# Patient Record
Sex: Female | Born: 1955 | Race: White | Hispanic: No | Marital: Married | Smoking: Never smoker
Health system: Southern US, Community
[De-identification: ages and names within clinical notes are randomized; demographics above are authoritative.]

## PROBLEM LIST (undated history)

## (undated) DIAGNOSIS — K219 Gastro-esophageal reflux disease without esophagitis: Secondary | ICD-10-CM

## (undated) DIAGNOSIS — F32A Depression, unspecified: Secondary | ICD-10-CM

## (undated) DIAGNOSIS — Z1509 Genetic susceptibility to other malignant neoplasm: Secondary | ICD-10-CM

## (undated) DIAGNOSIS — F419 Anxiety disorder, unspecified: Secondary | ICD-10-CM

## (undated) DIAGNOSIS — J45909 Unspecified asthma, uncomplicated: Secondary | ICD-10-CM

## (undated) DIAGNOSIS — F988 Other specified behavioral and emotional disorders with onset usually occurring in childhood and adolescence: Secondary | ICD-10-CM

## (undated) DIAGNOSIS — K589 Irritable bowel syndrome without diarrhea: Secondary | ICD-10-CM

## (undated) DIAGNOSIS — Z8719 Personal history of other diseases of the digestive system: Secondary | ICD-10-CM

## (undated) DIAGNOSIS — D126 Benign neoplasm of colon, unspecified: Secondary | ICD-10-CM

## (undated) DIAGNOSIS — T7840XA Allergy, unspecified, initial encounter: Secondary | ICD-10-CM

## (undated) DIAGNOSIS — I10 Essential (primary) hypertension: Secondary | ICD-10-CM

## (undated) DIAGNOSIS — G709 Myoneural disorder, unspecified: Secondary | ICD-10-CM

## (undated) DIAGNOSIS — E119 Type 2 diabetes mellitus without complications: Secondary | ICD-10-CM

## (undated) DIAGNOSIS — E785 Hyperlipidemia, unspecified: Secondary | ICD-10-CM

## (undated) DIAGNOSIS — M1990 Unspecified osteoarthritis, unspecified site: Secondary | ICD-10-CM

## (undated) DIAGNOSIS — F329 Major depressive disorder, single episode, unspecified: Secondary | ICD-10-CM

## (undated) DIAGNOSIS — H269 Unspecified cataract: Secondary | ICD-10-CM

## (undated) HISTORY — DX: Benign neoplasm of colon, unspecified: D12.6

## (undated) HISTORY — DX: Unspecified cataract: H26.9

## (undated) HISTORY — DX: Myoneural disorder, unspecified: G70.9

## (undated) HISTORY — PX: CHOLECYSTECTOMY: SHX55

## (undated) HISTORY — PX: UPPER GI ENDOSCOPY: SHX6162

## (undated) HISTORY — DX: Allergy, unspecified, initial encounter: T78.40XA

## (undated) HISTORY — DX: Genetic susceptibility to other malignant neoplasm: Z15.09

## (undated) HISTORY — PX: NASAL SINUS SURGERY: SHX719

## (undated) HISTORY — DX: Unspecified osteoarthritis, unspecified site: M19.90

## (undated) HISTORY — PX: HERNIA REPAIR: SHX51

## (undated) HISTORY — PX: COSMETIC SURGERY: SHX468

## (undated) HISTORY — DX: Other specified behavioral and emotional disorders with onset usually occurring in childhood and adolescence: F98.8

## (undated) HISTORY — PX: OTHER SURGICAL HISTORY: SHX169

## (undated) HISTORY — PX: CARPAL TUNNEL RELEASE: SHX101

## (undated) HISTORY — DX: Hyperlipidemia, unspecified: E78.5

## (undated) HISTORY — DX: Irritable bowel syndrome, unspecified: K58.9

## (undated) HISTORY — PX: POLYPECTOMY: SHX149

## (undated) HISTORY — DX: Depression, unspecified: F32.A

## (undated) HISTORY — PX: TUBAL LIGATION: SHX77

## (undated) HISTORY — DX: Essential (primary) hypertension: I10

## (undated) HISTORY — DX: Type 2 diabetes mellitus without complications: E11.9

## (undated) HISTORY — DX: Major depressive disorder, single episode, unspecified: F32.9

---

## 1997-06-28 ENCOUNTER — Ambulatory Visit (HOSPITAL_COMMUNITY): Admission: RE | Admit: 1997-06-28 | Discharge: 1997-06-28 | Payer: Self-pay | Admitting: *Deleted

## 1998-02-21 ENCOUNTER — Ambulatory Visit (HOSPITAL_COMMUNITY): Admission: RE | Admit: 1998-02-21 | Discharge: 1998-02-21 | Payer: Self-pay | Admitting: Orthopedic Surgery

## 1998-12-24 ENCOUNTER — Other Ambulatory Visit: Admission: RE | Admit: 1998-12-24 | Discharge: 1998-12-24 | Payer: Self-pay | Admitting: Gastroenterology

## 1999-04-07 HISTORY — PX: NISSEN FUNDOPLICATION: SHX2091

## 1999-05-19 ENCOUNTER — Ambulatory Visit: Admission: RE | Admit: 1999-05-19 | Discharge: 1999-05-19 | Payer: Self-pay | Admitting: Internal Medicine

## 1999-07-07 ENCOUNTER — Ambulatory Visit: Admission: RE | Admit: 1999-07-07 | Discharge: 1999-07-07 | Payer: Self-pay | Admitting: Pulmonary Disease

## 2000-01-12 ENCOUNTER — Ambulatory Visit (HOSPITAL_COMMUNITY): Admission: RE | Admit: 2000-01-12 | Discharge: 2000-01-12 | Payer: Self-pay | Admitting: Gastroenterology

## 2000-03-12 ENCOUNTER — Ambulatory Visit (HOSPITAL_COMMUNITY): Admission: RE | Admit: 2000-03-12 | Discharge: 2000-03-12 | Payer: Self-pay | Admitting: Surgery

## 2000-03-12 ENCOUNTER — Encounter: Payer: Self-pay | Admitting: Surgery

## 2000-03-31 ENCOUNTER — Encounter: Payer: Self-pay | Admitting: Surgery

## 2000-04-06 ENCOUNTER — Inpatient Hospital Stay (HOSPITAL_COMMUNITY): Admission: RE | Admit: 2000-04-06 | Discharge: 2000-04-07 | Payer: Self-pay | Admitting: Surgery

## 2002-04-06 DIAGNOSIS — E785 Hyperlipidemia, unspecified: Secondary | ICD-10-CM

## 2002-04-06 HISTORY — DX: Hyperlipidemia, unspecified: E78.5

## 2002-12-01 ENCOUNTER — Encounter: Payer: Self-pay | Admitting: Orthopedic Surgery

## 2002-12-01 ENCOUNTER — Encounter: Admission: RE | Admit: 2002-12-01 | Discharge: 2002-12-01 | Payer: Self-pay | Admitting: Orthopedic Surgery

## 2004-04-06 HISTORY — PX: EYE SURGERY: SHX253

## 2005-05-04 ENCOUNTER — Ambulatory Visit: Payer: Self-pay

## 2005-06-04 ENCOUNTER — Ambulatory Visit: Payer: Self-pay

## 2006-06-18 ENCOUNTER — Encounter: Admission: RE | Admit: 2006-06-18 | Discharge: 2006-09-16 | Payer: Self-pay | Admitting: Specialist

## 2007-06-24 ENCOUNTER — Ambulatory Visit (HOSPITAL_COMMUNITY): Admission: RE | Admit: 2007-06-24 | Discharge: 2007-06-24 | Payer: Self-pay | Admitting: Internal Medicine

## 2007-06-24 ENCOUNTER — Encounter: Admission: RE | Admit: 2007-06-24 | Discharge: 2007-06-24 | Payer: Self-pay | Admitting: Orthopedic Surgery

## 2007-11-18 ENCOUNTER — Other Ambulatory Visit: Admission: RE | Admit: 2007-11-18 | Discharge: 2007-11-18 | Payer: Self-pay | Admitting: Internal Medicine

## 2008-04-06 DIAGNOSIS — E119 Type 2 diabetes mellitus without complications: Secondary | ICD-10-CM

## 2008-04-06 HISTORY — DX: Type 2 diabetes mellitus without complications: E11.9

## 2009-02-08 ENCOUNTER — Ambulatory Visit: Payer: Self-pay | Admitting: Genetic Counselor

## 2009-04-14 ENCOUNTER — Encounter: Admission: RE | Admit: 2009-04-14 | Discharge: 2009-04-14 | Payer: Self-pay | Admitting: Orthopaedic Surgery

## 2009-10-04 ENCOUNTER — Ambulatory Visit: Payer: Self-pay | Admitting: Genetic Counselor

## 2010-04-06 HISTORY — PX: ROTATOR CUFF REPAIR: SHX139

## 2010-12-09 ENCOUNTER — Other Ambulatory Visit: Payer: Self-pay | Admitting: Internal Medicine

## 2010-12-09 ENCOUNTER — Other Ambulatory Visit (HOSPITAL_COMMUNITY)
Admission: RE | Admit: 2010-12-09 | Discharge: 2010-12-09 | Disposition: A | Discharge status: Home / Self Care | Payer: PRIVATE HEALTH INSURANCE | Source: Ambulatory Visit | Attending: Internal Medicine | Admitting: Internal Medicine

## 2010-12-09 DIAGNOSIS — Z01419 Encounter for gynecological examination (general) (routine) without abnormal findings: Secondary | ICD-10-CM | POA: Insufficient documentation

## 2011-12-14 ENCOUNTER — Other Ambulatory Visit (HOSPITAL_COMMUNITY)
Admission: RE | Admit: 2011-12-14 | Discharge: 2011-12-14 | Disposition: A | Discharge status: Home / Self Care | Payer: PRIVATE HEALTH INSURANCE | Source: Ambulatory Visit | Attending: Internal Medicine | Admitting: Internal Medicine

## 2011-12-14 ENCOUNTER — Other Ambulatory Visit: Payer: Self-pay | Admitting: Emergency Medicine

## 2011-12-14 DIAGNOSIS — Z01419 Encounter for gynecological examination (general) (routine) without abnormal findings: Secondary | ICD-10-CM | POA: Insufficient documentation

## 2012-07-18 ENCOUNTER — Other Ambulatory Visit: Payer: Self-pay | Admitting: Emergency Medicine

## 2012-12-26 ENCOUNTER — Other Ambulatory Visit: Payer: Self-pay | Admitting: *Deleted

## 2012-12-26 DIAGNOSIS — I83893 Varicose veins of bilateral lower extremities with other complications: Secondary | ICD-10-CM

## 2013-01-23 ENCOUNTER — Encounter: Payer: Self-pay | Admitting: Vascular Surgery

## 2013-01-24 ENCOUNTER — Encounter (INDEPENDENT_AMBULATORY_CARE_PROVIDER_SITE_OTHER): Payer: Self-pay

## 2013-01-24 ENCOUNTER — Ambulatory Visit (HOSPITAL_COMMUNITY)
Admission: RE | Admit: 2013-01-24 | Discharge: 2013-01-24 | Disposition: A | Discharge status: Home / Self Care | Payer: Managed Care, Other (non HMO) | Source: Ambulatory Visit | Attending: Vascular Surgery | Admitting: Vascular Surgery

## 2013-01-24 ENCOUNTER — Ambulatory Visit (INDEPENDENT_AMBULATORY_CARE_PROVIDER_SITE_OTHER): Payer: Managed Care, Other (non HMO) | Admitting: Vascular Surgery

## 2013-01-24 ENCOUNTER — Encounter: Payer: Self-pay | Admitting: Vascular Surgery

## 2013-01-24 VITALS — BP 121/72 | HR 83 | Ht 64.0 in | Wt 179.2 lb

## 2013-01-24 DIAGNOSIS — I83893 Varicose veins of bilateral lower extremities with other complications: Secondary | ICD-10-CM

## 2013-01-24 DIAGNOSIS — I8390 Asymptomatic varicose veins of unspecified lower extremity: Secondary | ICD-10-CM | POA: Insufficient documentation

## 2013-01-24 DIAGNOSIS — M79609 Pain in unspecified limb: Secondary | ICD-10-CM | POA: Insufficient documentation

## 2013-01-24 DIAGNOSIS — R609 Edema, unspecified: Secondary | ICD-10-CM | POA: Insufficient documentation

## 2013-01-24 NOTE — Progress Notes (Signed)
[facility] [place]   Patient name: Sydney Dickson MRN: [id number] DOB: 1956/01/25 Sex: female   Referred by: Katrinka Blazing  Reason for referral:  Chief Complaint  Patient presents with  . New Evaluation    bil leg edema "feels like dead weights" - Loree Fee, PA    HISTORY OF PRESENT ILLNESS: The patient presents today for evaluation of lower surety discomfort. She is an active healthy 57 year old female who reports a sensation of heavy  Aching legs mostly from her knees distally. This is somewhat worse on her left leg her right leg. She reports this is a progressive throughout the day. She does not have the sensation in the morning when she first arises. She notes minimal swelling at the end of the day. She has no history of DVT. She does have a history of venous disease in mother and sister.  Past Medical History  Diagnosis Date  . COPD (chronic obstructive pulmonary disease)     Past Surgical History  Procedure Laterality Date  . Hernia repair      History   Social History  . Marital Status: Married    Spouse Name: N/A    Number of Children: N/A  . Years of Education: N/A   Occupational History  . Not on file.   Social History Main Topics  . Smoking status: Never Smoker   . Smokeless tobacco: Not on file  . Alcohol Use: No  . Drug Use: No  . Sexual Activity: Not on file   Other Topics Concern  . Not on file   Social History Narrative  . No narrative on file    Family History  Problem Relation Age of Onset  . Cancer Mother   . Hyperlipidemia Mother   . Hypertension Mother   . Cancer Father   . Hyperlipidemia Father   . Hypertension Father   . Cancer Sister   . Cancer Brother     Allergies as of 01/24/2013 - Review Complete 01/24/2013  Allergen Reaction Noted  . Penicillins  01/24/2013    No current outpatient prescriptions on file prior to visit.   No current facility-administered medications on file prior to visit.      REVIEW OF SYSTEMS:  Positives indicated with an "X"  CARDIOVASCULAR:  [ ]  chest pain   [ ]  chest pressure   [ ]  palpitations   [ ]  orthopnea   [x ] dyspnea on exertion   [ ]  claudication   [ ]  rest pain   [ ]  DVT   [ ]  phlebitis PULMONARY:   [ ]  productive cough   [ ]  asthma   [ ]  wheezing NEUROLOGIC:   [x ] weakness  [ ]  paresthesias  [ ]  aphasia  [ ]  amaurosis  [ ]  dizziness HEMATOLOGIC:   [ ]  bleeding problems   [ ]  clotting disorders MUSCULOSKELETAL:  [ ]  joint pain   [ ]  joint swelling GASTROINTESTINAL: [ ]   blood in stool  [ ]   hematemesis GENITOURINARY:  [ ]   dysuria  [ ]   hematuria PSYCHIATRIC:  [ ]  history of major depression INTEGUMENTARY:  [ ]  rashes  [ ]  ulcers CONSTITUTIONAL:  [ ]  fever   [ ]  chills  PHYSICAL EXAMINATION:  General: The patient is a well-nourished female, in no acute distress. Vital signs are BP 121/72  Pulse 83  Ht 5\' 4"  (1.626 m)  Wt 179 lb 3.2 oz (81.285 kg)  BMI 30.74 kg/m2  SpO2 99% Pulmonary:  There is a good air exchange bilaterally without wheezing or rales. Abdomen: Soft and non-tender with normal pitch bowel sounds. Musculoskeletal: There are no major deformities.  There is no significant extremity pain. Neurologic: No focal weakness or paresthesias are detected, Skin: There are no ulcer or rashes noted. Psychiatric: The patient has normal affect. Cardiovascular: There is a regular rate and rhythm without significant murmur appreciated. Pulse status 2+ radial and 2+ dorsalis pedis pulses bilaterally No venous varicosities noted. Mild left ankle swelling.  [facility] Vascular Lab Studies:  Ordered and Independently Reviewed no evidence of deep venous thrombosis and no evidence of deep venous valvular incompetence. She does have reflux in her great saphenous vein bilaterally  Impression and Plan:  Lower extremity aching and tired and heavy sensation with prolonged standing or sitting. She also has documented venous hypertension related  superficial reflux bilaterally. I explained the significance of this the patient. I explained that her symptoms are well may be related to venous hypertension. We fitted her today with graduated compression stockings 20-30 mm mercury thigh-high instructed on the use of these. Also explained the importance of elevation and ibuprofen for treatment of her discomfort. We will see her again in 3 months for continued discussion. She would be a candidate for laser ablation of her great saphenous vein should she fail conservative treatment.    Kennen Stammer [facility] [place] Office: [phone]

## 2013-03-06 ENCOUNTER — Telehealth: Payer: Self-pay | Admitting: *Deleted

## 2013-03-06 NOTE — Telephone Encounter (Signed)
Rx= written  Call pt when ready   Refill = Vyanse

## 2013-03-09 ENCOUNTER — Telehealth: Payer: Self-pay | Admitting: Family Medicine

## 2013-03-09 NOTE — Telephone Encounter (Signed)
PT CALLED TO SAY THAT SHE WAS RETURNING YOUR CALL PROBABLY ABOUT THE VYVANSE NEW LOWER DOSE, 30MG  1 QD.  PLEASE CALL AND ADVISE [phone] OK TO LEAVE MESSAGE  NO FUTURE APPTS

## 2013-03-09 NOTE — Telephone Encounter (Signed)
Ok I can do new dose can you double check with her to see if we are just doing 30 day at local pharmacy that [place] entered or the 90 day mail order? Please and thanks.

## 2013-03-13 ENCOUNTER — Other Ambulatory Visit: Payer: Self-pay | Admitting: Emergency Medicine

## 2013-03-13 MED ORDER — METFORMIN HCL 500 MG PO TABS: 500.0000 mg | ORAL_TABLET | Freq: Three times a day (TID) | ORAL | Status: DC

## 2013-03-14 ENCOUNTER — Other Ambulatory Visit: Payer: Self-pay | Admitting: Emergency Medicine

## 2013-03-14 MED ORDER — METFORMIN HCL 500 MG PO TABS: 500.0000 mg | ORAL_TABLET | Freq: Three times a day (TID) | ORAL | Status: DC

## 2013-03-23 ENCOUNTER — Other Ambulatory Visit: Payer: Self-pay | Admitting: Emergency Medicine

## 2013-03-23 MED ORDER — ESCITALOPRAM OXALATE 20 MG PO TABS: 20.0000 mg | ORAL_TABLET | Freq: Every day | ORAL | Status: DC

## 2013-03-27 ENCOUNTER — Other Ambulatory Visit: Payer: Self-pay | Admitting: Emergency Medicine

## 2013-03-27 MED ORDER — LISDEXAMFETAMINE DIMESYLATE 40 MG PO CAPS: 40.0000 mg | ORAL_CAPSULE | Freq: Every day | ORAL | Status: DC

## 2013-04-23 ENCOUNTER — Other Ambulatory Visit: Payer: Self-pay | Admitting: Internal Medicine

## 2013-04-27 ENCOUNTER — Encounter: Payer: Self-pay | Admitting: Emergency Medicine

## 2013-04-27 ENCOUNTER — Ambulatory Visit (INDEPENDENT_AMBULATORY_CARE_PROVIDER_SITE_OTHER): Payer: Managed Care, Other (non HMO) | Admitting: Emergency Medicine

## 2013-04-27 VITALS — BP 130/64 | HR 62 | Temp 98.0°F | Resp 18 | Ht 64.5 in | Wt 181.0 lb

## 2013-04-27 DIAGNOSIS — I10 Essential (primary) hypertension: Secondary | ICD-10-CM

## 2013-04-27 DIAGNOSIS — E785 Hyperlipidemia, unspecified: Secondary | ICD-10-CM

## 2013-04-27 DIAGNOSIS — S51809A Unspecified open wound of unspecified forearm, initial encounter: Secondary | ICD-10-CM

## 2013-04-27 DIAGNOSIS — E119 Type 2 diabetes mellitus without complications: Secondary | ICD-10-CM

## 2013-04-27 DIAGNOSIS — S61451A Open bite of right hand, initial encounter: Secondary | ICD-10-CM

## 2013-04-27 DIAGNOSIS — E1165 Type 2 diabetes mellitus with hyperglycemia: Secondary | ICD-10-CM | POA: Insufficient documentation

## 2013-04-27 DIAGNOSIS — E1169 Type 2 diabetes mellitus with other specified complication: Secondary | ICD-10-CM | POA: Insufficient documentation

## 2013-04-27 DIAGNOSIS — F988 Other specified behavioral and emotional disorders with onset usually occurring in childhood and adolescence: Secondary | ICD-10-CM | POA: Insufficient documentation

## 2013-04-27 DIAGNOSIS — L089 Local infection of the skin and subcutaneous tissue, unspecified: Secondary | ICD-10-CM

## 2013-04-27 DIAGNOSIS — S51851A Open bite of right forearm, initial encounter: Secondary | ICD-10-CM

## 2013-04-27 DIAGNOSIS — W5501XA Bitten by cat, initial encounter: Principal | ICD-10-CM

## 2013-04-27 DIAGNOSIS — S61409A Unspecified open wound of unspecified hand, initial encounter: Secondary | ICD-10-CM

## 2013-04-27 LAB — HEMOGLOBIN A1C
Hgb A1c MFr Bld: 6.7 % — ABNORMAL HIGH (ref <5.7)
MEAN PLASMA GLUCOSE: 146 mg/dL — AB (ref <117)

## 2013-04-27 LAB — CBC WITH DIFFERENTIAL/PLATELET
BASOS ABS: 0.1 10*3/uL (ref 0.0–0.1)
BASOS PCT: 1 % (ref 0–1)
EOS PCT: 3 % (ref 0–5)
Eosinophils Absolute: 0.3 10*3/uL (ref 0.0–0.7)
HCT: 40.7 % (ref 36.0–46.0)
Hemoglobin: 13.9 g/dL (ref 12.0–15.0)
Lymphocytes Relative: 39 % (ref 12–46)
Lymphs Abs: 3.5 10*3/uL (ref 0.7–4.0)
MCH: 27.9 pg (ref 26.0–34.0)
MCHC: 34.2 g/dL (ref 30.0–36.0)
MCV: 81.7 fL (ref 78.0–100.0)
MONO ABS: 0.6 10*3/uL (ref 0.1–1.0)
Monocytes Relative: 6 % (ref 3–12)
Neutro Abs: 4.6 10*3/uL (ref 1.7–7.7)
Neutrophils Relative %: 51 % (ref 43–77)
Platelets: 339 10*3/uL (ref 150–400)
RBC: 4.98 MIL/uL (ref 3.87–5.11)
RDW: 14.2 % (ref 11.5–15.5)
WBC: 9 10*3/uL (ref 4.0–10.5)

## 2013-04-27 MED ORDER — DOXYCYCLINE HYCLATE 100 MG PO TABS: 100.0000 mg | ORAL_TABLET | Freq: Two times a day (BID) | ORAL | Status: DC

## 2013-04-27 MED ORDER — CEFTRIAXONE SODIUM 1 G IJ SOLR
1.0000 g | Freq: Once | INTRAMUSCULAR | Status: AC
  Administered 2013-04-27: 1 g via INTRAMUSCULAR

## 2013-04-27 MED ORDER — CEFTRIAXONE SODIUM 500 MG IJ SOLR: 500.0000 mg | Freq: Once | INTRAMUSCULAR | Status: DC

## 2013-04-27 MED ORDER — GLUCOSE BLOOD VI STRP: ORAL_STRIP | Status: DC

## 2013-04-27 NOTE — Patient Instructions (Signed)
Animal Bite  Animal bite wounds can get infected. It is important to get proper medical treatment. Ask your doctor if you need a rabies shot.  HOME CARE   · Follow your doctor's instructions for taking care of your wound.  · Only take medicine as told by your doctor.  · Take your medicine (antibiotics) as told. Finish them even if you start to feel better.  · Keep all doctor visits as told.  You may need a tetanus shot if:   · You cannot remember when you had your last tetanus shot.  · You have never had a tetanus shot.  · The injury broke your skin.  If you need a tetanus shot and you choose not to have one, you may get tetanus. Sickness from tetanus can be serious.  GET HELP RIGHT AWAY IF:   · Your wound is warm, red, sore, or puffy (swollen).  · You notice yellowish-white fluid (pus) or a bad smell coming from the wound.  · You see a red line on the skin coming from the wound.  · You have a fever, chills, or you feel sick.  · You feel sick to your stomach (nauseous), or you throw up (vomit).  · Your pain does not go away, or it gets worse.  · You have trouble moving the injured part.  · You have questions or concerns.  MAKE SURE YOU:   · Understand these instructions.  · Will watch your condition.  · Will get help right away if you are not doing well or get worse.  Document Released: 03/23/2005 Document Revised: 06/15/2011 Document Reviewed: 11/12/2010  ExitCare® Patient Information ©2014 [employer].

## 2013-04-27 NOTE — Progress Notes (Signed)
Subjective:    Patient ID: Sydney Dickson, female    DOB: [birth year], 57 y.o.   MRN: [id number]  HPI Comments: 57 yo female presents for 3 month F/U for HTN, Cholesterol, DM, D. deficient Off MF x 2 days because it was causing joint aches, aches resolved when d/c Rx. She was started at last OV with metformin for DM with elevated A1c. She is trying to improve diet. She is not exercising routinely. She keeps busy with work. She notes BP is good at home and BS is not being checked.  She was bitten and scratched by a cat on her right elbow and wrist area. She notes area was cleaned immediately but has had increased redness and swelling. She denies fever. She is unaware if animal has been vaccinated for rabies, but notes cat appeared to be "normal".  Current Outpatient Prescriptions on File Prior to Visit  Medication Sig Dispense Refill  . ALPRAZolam (XANAX) 0.5 MG tablet Take 1 tablet by mouth as needed.      . bisoprolol-hydrochlorothiazide (ZIAC) 10-6.25 MG per tablet Take 1 tablet by mouth daily.      . CRESTOR 20 MG tablet Take 1 tablet by mouth every other day.      . escitalopram (LEXAPRO) 20 MG tablet Take 1 tablet (20 mg total) by mouth daily.  90 tablet  1  . ibuprofen (ADVIL,MOTRIN) 600 MG tablet Take 600 mg by mouth every 6 (six) hours as needed for pain.      Marland Kitchen levocetirizine (XYZAL) 5 MG tablet TAKE 1 TABLET EVERY DAY  90 tablet  3  . lisdexamfetamine (VYVANSE) 40 MG capsule Take 1 capsule (40 mg total) by mouth daily.  90 capsule  0   No current facility-administered medications on file prior to visit.     ALLERGIES Lidocaine; Morphine and related; Penicillins; and Wellbutrin  Past Medical History  Diagnosis Date  . Hypertension   . Hyperlipidemia   . Diabetes mellitus without complication   . ADD (attention deficit disorder)   . Depression       Review of Systems  Skin: Positive for wound.  All other systems reviewed and are negative.   BP 130/64  Pulse 62   Temp(Src) 98 F (36.7 C) (Temporal)  Resp 18  Ht 5' 4.5" (1.638 m)  Wt 181 lb (82.101 kg)  BMI 30.60 kg/m2     Objective:   Physical Exam  Nursing note and vitals reviewed. Constitutional: She is oriented to person, place, and time. She appears well-developed and well-nourished. No distress.  HENT:  Head: Normocephalic and atraumatic.  Right Ear: External ear normal.  Left Ear: External ear normal.  Nose: Nose normal.  Mouth/Throat: Oropharynx is clear and moist.  Eyes: Conjunctivae and EOM are normal.  Neck: Normal range of motion. Neck supple. No JVD present. No thyromegaly present.  Cardiovascular: Normal rate, regular rhythm, normal heart sounds and intact distal pulses.   Pulmonary/Chest: Effort normal and breath sounds normal.  Abdominal: Soft. Bowel sounds are normal. She exhibits no distension and no mass. There is no tenderness. There is no rebound and no guarding.  Musculoskeletal: Normal range of motion. She exhibits no edema and no tenderness.  Lymphadenopathy:    She has no cervical adenopathy.  Neurological: She is alert and oriented to person, place, and time. No cranial nerve deficit.  Skin: Skin is warm and dry. No rash noted. No erythema. No pallor.     Psychiatric: She has a normal mood  and affect. Her behavior is normal. Judgment and thought content normal.          Assessment & Plan:  1.  3 month F/U for HTN, Cholesterol,DM, D. Deficient. Needs healthy diet, cardio QD and obtain healthy weight. Check Labs, Check BP if >130/80 call office, Check BS if >200 call office Freestyle Lite meter given 2.Right arm cat bite x 2. Patient will verify if cat is up to date on Rabies vaccinations/ if not ER for series. Rocephin 1 gm, Doxy 100 BID AD. Wound hygiene explained.

## 2013-04-28 LAB — LIPID PANEL
Cholesterol: 154 mg/dL (ref 0–200)
HDL: 55 mg/dL (ref >39)
LDL CALC: 79 mg/dL (ref 0–99)
Total CHOL/HDL Ratio: 2.8 Ratio
Triglycerides: 101 mg/dL (ref <150)
VLDL: 20 mg/dL (ref 0–40)

## 2013-04-28 LAB — BASIC METABOLIC PANEL WITH GFR
BUN: 16 mg/dL (ref 6–23)
CHLORIDE: 104 meq/L (ref 96–112)
CO2: 27 mEq/L (ref 19–32)
CREATININE: 0.58 mg/dL (ref 0.50–1.10)
Calcium: 9.7 mg/dL (ref 8.4–10.5)
Glucose, Bld: 115 mg/dL — ABNORMAL HIGH (ref 70–99)
POTASSIUM: 4.1 meq/L (ref 3.5–5.3)
Sodium: 137 mEq/L (ref 135–145)

## 2013-04-28 LAB — HEPATIC FUNCTION PANEL
ALK PHOS: 100 U/L (ref 39–117)
ALT: 20 U/L (ref 0–35)
AST: 16 U/L (ref 0–37)
Albumin: 4.1 g/dL (ref 3.5–5.2)
BILIRUBIN DIRECT: 0.1 mg/dL (ref 0.0–0.3)
BILIRUBIN INDIRECT: 0.6 mg/dL (ref 0.0–0.9)
BILIRUBIN TOTAL: 0.7 mg/dL (ref 0.3–1.2)
Total Protein: 6.9 g/dL (ref 6.0–8.3)

## 2013-04-28 LAB — INSULIN, FASTING: Insulin fasting, serum: 38 u[IU]/mL — ABNORMAL HIGH (ref 3–28)

## 2013-05-01 ENCOUNTER — Encounter: Payer: Self-pay | Admitting: Vascular Surgery

## 2013-05-02 ENCOUNTER — Ambulatory Visit: Payer: 59 | Admitting: Vascular Surgery

## 2013-07-06 ENCOUNTER — Other Ambulatory Visit: Payer: Self-pay | Admitting: Emergency Medicine

## 2013-07-06 MED ORDER — LISDEXAMFETAMINE DIMESYLATE 40 MG PO CAPS: 40.0000 mg | ORAL_CAPSULE | Freq: Every day | ORAL | Status: DC

## 2013-07-12 ENCOUNTER — Other Ambulatory Visit: Payer: Self-pay | Admitting: Emergency Medicine

## 2013-08-03 ENCOUNTER — Encounter: Payer: Self-pay | Admitting: Emergency Medicine

## 2013-08-03 ENCOUNTER — Ambulatory Visit (INDEPENDENT_AMBULATORY_CARE_PROVIDER_SITE_OTHER): Payer: 59 | Admitting: Emergency Medicine

## 2013-08-03 VITALS — BP 118/70 | HR 60 | Temp 97.8°F | Resp 18 | Ht 64.5 in | Wt 178.0 lb

## 2013-08-03 DIAGNOSIS — E119 Type 2 diabetes mellitus without complications: Secondary | ICD-10-CM

## 2013-08-03 DIAGNOSIS — Z23 Encounter for immunization: Secondary | ICD-10-CM

## 2013-08-03 DIAGNOSIS — E782 Mixed hyperlipidemia: Secondary | ICD-10-CM

## 2013-08-03 DIAGNOSIS — I10 Essential (primary) hypertension: Secondary | ICD-10-CM

## 2013-08-03 LAB — CBC WITH DIFFERENTIAL/PLATELET
Basophils Absolute: 0.1 10*3/uL (ref 0.0–0.1)
Basophils Relative: 1 % (ref 0–1)
Eosinophils Absolute: 0.3 10*3/uL (ref 0.0–0.7)
Eosinophils Relative: 3 % (ref 0–5)
HCT: 40.4 % (ref 36.0–46.0)
HEMOGLOBIN: 13.9 g/dL (ref 12.0–15.0)
LYMPHS ABS: 3.8 10*3/uL (ref 0.7–4.0)
LYMPHS PCT: 41 % (ref 12–46)
MCH: 28.5 pg (ref 26.0–34.0)
MCHC: 34.4 g/dL (ref 30.0–36.0)
MCV: 82.8 fL (ref 78.0–100.0)
Monocytes Absolute: 0.6 10*3/uL (ref 0.1–1.0)
Monocytes Relative: 6 % (ref 3–12)
NEUTROS ABS: 4.6 10*3/uL (ref 1.7–7.7)
NEUTROS PCT: 49 % (ref 43–77)
Platelets: 331 10*3/uL (ref 150–400)
RBC: 4.88 MIL/uL (ref 3.87–5.11)
RDW: 13.9 % (ref 11.5–15.5)
WBC: 9.3 10*3/uL (ref 4.0–10.5)

## 2013-08-03 LAB — HEMOGLOBIN A1C
HEMOGLOBIN A1C: 7.1 % — AB (ref <5.7)
MEAN PLASMA GLUCOSE: 157 mg/dL — AB (ref <117)

## 2013-08-03 NOTE — Patient Instructions (Signed)

## 2013-08-03 NOTE — Progress Notes (Signed)
Subjective:    Patient ID: Sydney Dickson, female    DOB: [birth year], 58 y.o.   MRN: [id number]  HPI Comments: 58 yo WF presents for 3 month F/U for HTN, Cholesterol, DM, D. Deficient. She is not eating healthy. She keeps busy with work. She was unable to tolerate metformin due to myalgias. She notes BP is good.  Last labs CHOL         154   04/27/2013 HDL           55   04/27/2013 LDLCALC       79   04/27/2013 TRIG         101   04/27/2013 CHOLHDL      2.8   04/27/2013 ALT           20   04/27/2013 AST           16   04/27/2013 ALKPHOS      100   04/27/2013 BILITOT      0.7   04/27/2013 CREATININE     0.58   04/27/2013 BUN              16   04/27/2013 NA              137   04/27/2013 K               4.1   04/27/2013 CL              104   04/27/2013 CO2              27   04/27/2013 WBC      9.0   04/27/2013 HGB     13.9   04/27/2013 HCT     40.7   04/27/2013 MCV     81.7   04/27/2013 PLT      339   04/27/2013 HGBA1C      6.7   04/27/2013   Hypertension  Hyperlipidemia Associated symptoms include myalgias.      Medication List       This list is accurate as of: 08/03/13  1:55 PM.  Always use your most recent med list.               ALPRAZolam 0.5 MG tablet  Commonly known as:  XANAX  Take 1 tablet by mouth as needed.     bisoprolol-hydrochlorothiazide 10-6.25 MG per tablet  Commonly known as:  ZIAC  TAKE 1 TABLET BY MOUTH EVERY DAY FOR BLOOD PRESSURE     CRESTOR 20 MG tablet  Generic drug:  rosuvastatin  Take 1 tablet by mouth every other day.     escitalopram 20 MG tablet  Commonly known as:  LEXAPRO  Take 1 tablet (20 mg total) by mouth daily.     glucose blood test strip  Commonly known as:  FREESTYLE LITE  Use as instructed     ibuprofen 600 MG tablet  Commonly known as:  ADVIL,MOTRIN  Take 600 mg by mouth every 6 (six) hours as needed for pain.     levocetirizine 5 MG tablet  Commonly known as:  XYZAL  TAKE 1 TABLET EVERY DAY     lisdexamfetamine 40 MG capsule   Commonly known as:  VYVANSE  Take 1 capsule (40 mg total) by mouth daily.       Allergies  Allergen Reactions  . Lidocaine     rash  . Metformin And Related     myalgia  .  Morphine And Related   . Penicillins     Pt states that in 3rd grade she was given an injection in the buttock and reacted with a large hive in that area  . Wellbutrin [Bupropion]    Past Medical History  Diagnosis Date  . Hypertension   . Hyperlipidemia   . Diabetes mellitus without complication   . ADD (attention deficit disorder)   . Depression      Review of Systems  Musculoskeletal: Positive for arthralgias and myalgias.  All other systems reviewed and are negative.  BP 118/70  Pulse 60  Temp(Src) 97.8 F (36.6 C) (Temporal)  Resp 18  Ht 5' 4.5" (1.638 m)  Wt 178 lb (80.74 kg)  BMI 30.09 kg/m2     Objective:   Physical Exam  Nursing note and vitals reviewed. Constitutional: She is oriented to person, place, and time. She appears well-developed and well-nourished. No distress.  overweight  HENT:  Head: Normocephalic and atraumatic.  Right Ear: External ear normal.  Left Ear: External ear normal.  Nose: Nose normal.  Mouth/Throat: Oropharynx is clear and moist.  Eyes: Conjunctivae and EOM are normal.  Neck: Normal range of motion. Neck supple. No JVD present. No thyromegaly present.  Cardiovascular: Normal rate, regular rhythm, normal heart sounds and intact distal pulses.   Pulmonary/Chest: Effort normal and breath sounds normal.  Abdominal: Soft. Bowel sounds are normal. She exhibits no distension and no mass. There is no tenderness. There is no rebound and no guarding.  Musculoskeletal: Normal range of motion. She exhibits no edema and no tenderness.  Lymphadenopathy:    She has no cervical adenopathy.  Neurological: She is alert and oriented to person, place, and time. No cranial nerve deficit.  Skin: Skin is warm and dry. No rash noted. No erythema. No pallor.  Psychiatric: She  has a normal mood and affect. Her behavior is normal. Judgment and thought content normal.          Assessment & Plan:  1.  3 month F/U for HTN, Cholesterol,DM, D. Deficient. Needs healthy diet, cardio QD and obtain healthy weight. Check Labs, Check BP if >130/80 call office, Check BS if >200 call office. If A1C elevated trial of Farxiga 5 mg x 2 weeks then increase to 10 mg. SX x 3 boxes ea given, call with results

## 2013-08-04 ENCOUNTER — Other Ambulatory Visit: Payer: Self-pay | Admitting: Emergency Medicine

## 2013-08-04 LAB — BASIC METABOLIC PANEL WITH GFR
BUN: 15 mg/dL (ref 6–23)
CO2: 27 meq/L (ref 19–32)
Calcium: 9.9 mg/dL (ref 8.4–10.5)
Chloride: 104 mEq/L (ref 96–112)
Creat: 0.7 mg/dL (ref 0.50–1.10)
GFR, Est Non African American: >89 mL/min
GLUCOSE: 118 mg/dL — AB (ref 70–99)
POTASSIUM: 3.3 meq/L — AB (ref 3.5–5.3)
Sodium: 142 mEq/L (ref 135–145)

## 2013-08-04 LAB — LIPID PANEL
CHOL/HDL RATIO: 3.2 ratio
CHOLESTEROL: 174 mg/dL (ref 0–200)
HDL: 55 mg/dL (ref >39)
LDL Cholesterol: 88 mg/dL (ref 0–99)
Triglycerides: 154 mg/dL — ABNORMAL HIGH (ref <150)
VLDL: 31 mg/dL (ref 0–40)

## 2013-08-04 LAB — HEPATIC FUNCTION PANEL
ALT: 19 U/L (ref 0–35)
AST: 17 U/L (ref 0–37)
Albumin: 4.2 g/dL (ref 3.5–5.2)
Alkaline Phosphatase: 110 U/L (ref 39–117)
Bilirubin, Direct: <0.1 mg/dL (ref 0.0–0.3)
TOTAL PROTEIN: 7 g/dL (ref 6.0–8.3)
Total Bilirubin: 0.4 mg/dL (ref 0.2–1.2)

## 2013-08-04 LAB — INSULIN, FASTING: Insulin fasting, serum: 28 u[IU]/mL (ref 3–28)

## 2013-08-04 MED ORDER — POTASSIUM CHLORIDE ER 10 MEQ PO TBCR: 10.0000 meq | EXTENDED_RELEASE_TABLET | Freq: Every day | ORAL | Status: DC

## 2013-08-07 MED ORDER — DAPAGLIFLOZIN PROPANEDIOL 10 MG PO TABS: ORAL_TABLET | ORAL | Status: DC

## 2013-08-31 ENCOUNTER — Ambulatory Visit: Payer: Self-pay | Admitting: Emergency Medicine

## 2013-09-12 ENCOUNTER — Encounter: Payer: Self-pay | Admitting: Emergency Medicine

## 2013-09-12 ENCOUNTER — Ambulatory Visit (INDEPENDENT_AMBULATORY_CARE_PROVIDER_SITE_OTHER): Payer: 59 | Admitting: Emergency Medicine

## 2013-09-12 VITALS — BP 112/60 | HR 56 | Temp 98.0°F | Resp 18 | Ht 64.5 in | Wt 179.0 lb

## 2013-09-12 DIAGNOSIS — R6889 Other general symptoms and signs: Secondary | ICD-10-CM

## 2013-09-12 DIAGNOSIS — R5383 Other fatigue: Principal | ICD-10-CM

## 2013-09-12 DIAGNOSIS — R5381 Other malaise: Secondary | ICD-10-CM

## 2013-09-12 LAB — BASIC METABOLIC PANEL WITH GFR
BUN: 15 mg/dL (ref 6–23)
CO2: 27 mEq/L (ref 19–32)
Calcium: 9.4 mg/dL (ref 8.4–10.5)
Chloride: 104 mEq/L (ref 96–112)
Creat: 0.85 mg/dL (ref 0.50–1.10)
GFR, EST AFRICAN AMERICAN: 88 mL/min
GFR, Est Non African American: 76 mL/min
Glucose, Bld: 118 mg/dL — ABNORMAL HIGH (ref 70–99)
POTASSIUM: 3.9 meq/L (ref 3.5–5.3)
SODIUM: 137 meq/L (ref 135–145)

## 2013-09-12 LAB — CBC WITH DIFFERENTIAL/PLATELET
BASOS PCT: 1 % (ref 0–1)
Basophils Absolute: 0.1 10*3/uL (ref 0.0–0.1)
EOS ABS: 0.3 10*3/uL (ref 0.0–0.7)
Eosinophils Relative: 4 % (ref 0–5)
HCT: 41 % (ref 36.0–46.0)
HEMOGLOBIN: 14 g/dL (ref 12.0–15.0)
LYMPHS ABS: 3.1 10*3/uL (ref 0.7–4.0)
Lymphocytes Relative: 37 % (ref 12–46)
MCH: 27.9 pg (ref 26.0–34.0)
MCHC: 34.1 g/dL (ref 30.0–36.0)
MCV: 81.8 fL (ref 78.0–100.0)
MONO ABS: 0.6 10*3/uL (ref 0.1–1.0)
MONOS PCT: 7 % (ref 3–12)
NEUTROS ABS: 4.3 10*3/uL (ref 1.7–7.7)
NEUTROS PCT: 51 % (ref 43–77)
Platelets: 323 10*3/uL (ref 150–400)
RBC: 5.01 MIL/uL (ref 3.87–5.11)
RDW: 14.2 % (ref 11.5–15.5)
WBC: 8.4 10*3/uL (ref 4.0–10.5)

## 2013-09-12 NOTE — Progress Notes (Signed)
Subjective:    Patient ID: Sydney Dickson, female    DOB: 15-May-1955, 58 y.o.   MRN: [id number]  HPI Comments: 59 yo WF started [place] at last OV. She notes BS is good around 116. She is eating okay. She has noted more fatigue since starting Farxiga. She has been taking potassium AD since last visit with low level. She is keeping active with work, she has not lost any weight.     Medication List       This list is accurate as of: 09/12/13 12:05 PM.  Always use your most recent med list.               ALPRAZolam 0.5 MG tablet  Commonly known as:  XANAX  Take 1 tablet by mouth as needed.     bisoprolol-hydrochlorothiazide 10-6.25 MG per tablet  Commonly known as:  ZIAC  TAKE 1 TABLET BY MOUTH EVERY DAY FOR BLOOD PRESSURE     CRESTOR 20 MG tablet  Generic drug:  rosuvastatin  Take 1 tablet by mouth every other day.     Dapagliflozin Propanediol 10 MG Tabs  Commonly known as:  FARXIGA  Start 5mg  then increase to 10 mg after 2 weeks     escitalopram 20 MG tablet  Commonly known as:  LEXAPRO  Take 1 tablet (20 mg total) by mouth daily.     glucose blood test strip  Commonly known as:  FREESTYLE LITE  Use as instructed     ibuprofen 600 MG tablet  Commonly known as:  ADVIL,MOTRIN  Take 600 mg by mouth every 6 (six) hours as needed for pain.     levocetirizine 5 MG tablet  Commonly known as:  XYZAL  TAKE 1 TABLET EVERY DAY     lisdexamfetamine 40 MG capsule  Commonly known as:  VYVANSE  Take 1 capsule (40 mg total) by mouth daily.     potassium chloride 10 MEQ tablet  Commonly known as:  K-DUR  Take 1 tablet (10 mEq total) by mouth daily.       Allergies  Allergen Reactions  . Lidocaine     rash  . Metformin And Related     myalgia  . Morphine And Related   . Penicillins     Pt states that in 3rd grade she was given an injection in the buttock and reacted with a large hive in that area  . Wellbutrin [Bupropion]    Past Medical History  Diagnosis Date   . Hypertension   . Hyperlipidemia   . Diabetes mellitus without complication   . ADD (attention deficit disorder)   . Depression      Review of Systems  Constitutional: Positive for fatigue.  All other systems reviewed and are negative.  BP 112/60  Pulse 56  Temp(Src) 98 F (36.7 C) (Temporal)  Resp 18  Ht 5' 4.5" (1.638 m)  Wt 179 lb (81.194 kg)  BMI 30.26 kg/m2     Objective:   Physical Exam  Nursing note and vitals reviewed. Constitutional: She is oriented to person, place, and time. She appears well-developed and well-nourished.  HENT:  Head: Normocephalic and atraumatic.  Eyes: Conjunctivae are normal.  Neck: Normal range of motion.  Cardiovascular: Normal rate, regular rhythm, normal heart sounds and intact distal pulses.   Pulmonary/Chest: Effort normal and breath sounds normal.  Abdominal: Soft. Bowel sounds are normal. She exhibits no distension. There is no tenderness.  Musculoskeletal: Normal range of motion.  Neurological: She  is alert and oriented to person, place, and time.  Skin: Skin is warm and dry.  Psychiatric: She has a normal mood and affect. Judgment normal.          Assessment & Plan:  1. Fatigue vs hypotension vs hypokalemia- check labs, increase activity and H2O. Decrease ziac to 1/2 tab  2. DM- Check labs, SX farxiga 10 mg given #28

## 2013-09-13 ENCOUNTER — Other Ambulatory Visit: Payer: Self-pay | Admitting: Emergency Medicine

## 2013-09-13 LAB — TSH: TSH: 3.224 u[IU]/mL (ref 0.350–4.500)

## 2013-09-13 MED ORDER — DAPAGLIFLOZIN PROPANEDIOL 10 MG PO TABS: ORAL_TABLET | ORAL | Status: DC

## 2013-10-05 ENCOUNTER — Other Ambulatory Visit: Payer: Self-pay | Admitting: Emergency Medicine

## 2013-10-16 ENCOUNTER — Other Ambulatory Visit: Payer: Self-pay | Admitting: Emergency Medicine

## 2013-10-16 MED ORDER — LISDEXAMFETAMINE DIMESYLATE 40 MG PO CAPS: 40.0000 mg | ORAL_CAPSULE | Freq: Every day | ORAL | Status: DC

## 2013-10-31 ENCOUNTER — Encounter: Payer: Self-pay | Admitting: *Deleted

## 2013-11-17 ENCOUNTER — Encounter: Payer: Self-pay | Admitting: *Deleted

## 2013-12-21 ENCOUNTER — Encounter: Payer: Self-pay | Admitting: Emergency Medicine

## 2013-12-26 ENCOUNTER — Other Ambulatory Visit (INDEPENDENT_AMBULATORY_CARE_PROVIDER_SITE_OTHER): Payer: 59

## 2013-12-26 DIAGNOSIS — Z0000 Encounter for general adult medical examination without abnormal findings: Secondary | ICD-10-CM

## 2013-12-26 LAB — URINALYSIS, ROUTINE W REFLEX MICROSCOPIC
Bilirubin Urine: NEGATIVE
Glucose, UA: NEGATIVE mg/dL
HGB URINE DIPSTICK: NEGATIVE
Ketones, ur: NEGATIVE mg/dL
Nitrite: NEGATIVE
Protein, ur: NEGATIVE mg/dL
SPECIFIC GRAVITY, URINE: 1.017 (ref 1.005–1.030)
UROBILINOGEN UA: 0.2 mg/dL (ref 0.0–1.0)
pH: 5 (ref 5.0–8.0)

## 2013-12-26 LAB — CBC WITH DIFFERENTIAL/PLATELET
BASOS ABS: 0.1 10*3/uL (ref 0.0–0.1)
BASOS PCT: 1 % (ref 0–1)
EOS PCT: 3 % (ref 0–5)
Eosinophils Absolute: 0.2 10*3/uL (ref 0.0–0.7)
HEMATOCRIT: 43.3 % (ref 36.0–46.0)
Hemoglobin: 14.7 g/dL (ref 12.0–15.0)
Lymphocytes Relative: 43 % (ref 12–46)
Lymphs Abs: 3.1 10*3/uL (ref 0.7–4.0)
MCH: 27.7 pg (ref 26.0–34.0)
MCHC: 33.9 g/dL (ref 30.0–36.0)
MCV: 81.7 fL (ref 78.0–100.0)
MONO ABS: 0.5 10*3/uL (ref 0.1–1.0)
Monocytes Relative: 7 % (ref 3–12)
Neutro Abs: 3.3 10*3/uL (ref 1.7–7.7)
Neutrophils Relative %: 46 % (ref 43–77)
Platelets: 342 10*3/uL (ref 150–400)
RBC: 5.3 MIL/uL — ABNORMAL HIGH (ref 3.87–5.11)
RDW: 13.6 % (ref 11.5–15.5)
WBC: 7.1 10*3/uL (ref 4.0–10.5)

## 2013-12-26 LAB — IRON AND TIBC
%SAT: 22 % (ref 20–55)
IRON: 79 ug/dL (ref 42–145)
TIBC: 363 ug/dL (ref 250–470)
UIBC: 284 ug/dL (ref 125–400)

## 2013-12-26 LAB — VITAMIN B12: Vitamin B-12: 570 pg/mL (ref 211–911)

## 2013-12-26 LAB — BASIC METABOLIC PANEL WITH GFR
BUN: 14 mg/dL (ref 6–23)
CO2: 25 mEq/L (ref 19–32)
Calcium: 9.8 mg/dL (ref 8.4–10.5)
Chloride: 103 mEq/L (ref 96–112)
Creat: 0.76 mg/dL (ref 0.50–1.10)
GFR, EST NON AFRICAN AMERICAN: 87 mL/min
GLUCOSE: 145 mg/dL — AB (ref 70–99)
Potassium: 4 mEq/L (ref 3.5–5.3)
Sodium: 139 mEq/L (ref 135–145)

## 2013-12-26 LAB — URINALYSIS, MICROSCOPIC ONLY
Casts: NONE SEEN
Crystals: NONE SEEN

## 2013-12-26 LAB — LIPID PANEL
Cholesterol: 192 mg/dL (ref 0–200)
HDL: 56 mg/dL (ref >39)
LDL Cholesterol: 118 mg/dL — ABNORMAL HIGH (ref 0–99)
TRIGLYCERIDES: 89 mg/dL (ref <150)
Total CHOL/HDL Ratio: 3.4 Ratio
VLDL: 18 mg/dL (ref 0–40)

## 2013-12-26 LAB — HEPATIC FUNCTION PANEL
ALBUMIN: 4.4 g/dL (ref 3.5–5.2)
ALT: 18 U/L (ref 0–35)
AST: 17 U/L (ref 0–37)
Alkaline Phosphatase: 120 U/L — ABNORMAL HIGH (ref 39–117)
Bilirubin, Direct: 0.2 mg/dL (ref 0.0–0.3)
Indirect Bilirubin: 0.4 mg/dL (ref 0.2–1.2)
TOTAL PROTEIN: 7.5 g/dL (ref 6.0–8.3)
Total Bilirubin: 0.6 mg/dL (ref 0.2–1.2)

## 2013-12-26 LAB — FERRITIN: FERRITIN: 116 ng/mL (ref 10–291)

## 2013-12-26 LAB — MICROALBUMIN / CREATININE URINE RATIO
CREATININE, URINE: 160.7 mg/dL
Microalb Creat Ratio: 3.7 mg/g (ref 0.0–30.0)
Microalb, Ur: 0.6 mg/dL (ref <2.0)

## 2013-12-26 LAB — MAGNESIUM: Magnesium: 1.9 mg/dL (ref 1.5–2.5)

## 2013-12-26 LAB — HEMOGLOBIN A1C
Hgb A1c MFr Bld: 7 % — ABNORMAL HIGH (ref <5.7)
Mean Plasma Glucose: 154 mg/dL — ABNORMAL HIGH (ref <117)

## 2013-12-26 LAB — TSH: TSH: 3.398 u[IU]/mL (ref 0.350–4.500)

## 2013-12-27 LAB — INSULIN, FASTING: Insulin fasting, serum: 17.5 u[IU]/mL (ref 2.0–19.6)

## 2013-12-27 LAB — VITAMIN D 25 HYDROXY (VIT D DEFICIENCY, FRACTURES): Vit D, 25-Hydroxy: 36 ng/mL (ref 30–89)

## 2013-12-28 ENCOUNTER — Other Ambulatory Visit (HOSPITAL_COMMUNITY)
Admission: RE | Admit: 2013-12-28 | Discharge: 2013-12-28 | Disposition: A | Discharge status: Home / Self Care | Payer: Managed Care, Other (non HMO) | Source: Ambulatory Visit | Attending: Internal Medicine | Admitting: Internal Medicine

## 2013-12-28 ENCOUNTER — Ambulatory Visit (INDEPENDENT_AMBULATORY_CARE_PROVIDER_SITE_OTHER): Payer: 59 | Admitting: Emergency Medicine

## 2013-12-28 ENCOUNTER — Encounter: Payer: Self-pay | Admitting: Emergency Medicine

## 2013-12-28 VITALS — BP 126/68 | HR 68 | Temp 97.9°F | Resp 18 | Ht 64.5 in | Wt 176.8 lb

## 2013-12-28 DIAGNOSIS — M2550 Pain in unspecified joint: Secondary | ICD-10-CM

## 2013-12-28 DIAGNOSIS — R143 Flatulence: Secondary | ICD-10-CM

## 2013-12-28 DIAGNOSIS — Z124 Encounter for screening for malignant neoplasm of cervix: Secondary | ICD-10-CM

## 2013-12-28 DIAGNOSIS — Z0000 Encounter for general adult medical examination without abnormal findings: Secondary | ICD-10-CM

## 2013-12-28 DIAGNOSIS — Z111 Encounter for screening for respiratory tuberculosis: Secondary | ICD-10-CM

## 2013-12-28 DIAGNOSIS — IMO0001 Reserved for inherently not codable concepts without codable children: Secondary | ICD-10-CM

## 2013-12-28 DIAGNOSIS — R141 Gas pain: Secondary | ICD-10-CM

## 2013-12-28 DIAGNOSIS — Z01419 Encounter for gynecological examination (general) (routine) without abnormal findings: Secondary | ICD-10-CM | POA: Insufficient documentation

## 2013-12-28 DIAGNOSIS — K5900 Constipation, unspecified: Secondary | ICD-10-CM

## 2013-12-28 DIAGNOSIS — E782 Mixed hyperlipidemia: Secondary | ICD-10-CM

## 2013-12-28 DIAGNOSIS — R1030 Lower abdominal pain, unspecified: Secondary | ICD-10-CM

## 2013-12-28 DIAGNOSIS — R142 Eructation: Secondary | ICD-10-CM

## 2013-12-28 DIAGNOSIS — R109 Unspecified abdominal pain: Secondary | ICD-10-CM

## 2013-12-28 DIAGNOSIS — R140 Abdominal distension (gaseous): Secondary | ICD-10-CM

## 2013-12-28 DIAGNOSIS — I10 Essential (primary) hypertension: Secondary | ICD-10-CM

## 2013-12-28 DIAGNOSIS — E119 Type 2 diabetes mellitus without complications: Secondary | ICD-10-CM

## 2013-12-28 MED ORDER — CYCLOBENZAPRINE HCL 5 MG PO TABS: 5.0000 mg | ORAL_TABLET | Freq: Two times a day (BID) | ORAL | Status: DC

## 2013-12-28 MED ORDER — ALPRAZOLAM 0.5 MG PO TABS: 0.5000 mg | ORAL_TABLET | Freq: Two times a day (BID) | ORAL | Status: DC | PRN

## 2013-12-28 NOTE — Patient Instructions (Signed)
Arthritis, Nonspecific °Arthritis is pain, redness, warmth, or puffiness (inflammation) of a joint. The joint may be stiff or hurt when you move it. One or more joints may be affected. There are many types of arthritis. Your doctor may not know what type you have right away. The most common cause of arthritis is wear and tear on the joint (osteoarthritis). °HOME CARE  °· Only take medicine as told by your doctor. °· Rest the joint as much as possible. °· Raise (elevate) your joint if it is puffy. °· Use crutches if the painful joint is in your leg. °· Drink enough fluids to keep your pee (urine) clear or pale yellow. °· Follow your doctor's diet instructions. °· Use cold packs for very bad joint pain for 10 to 15 minutes every hour. Ask your doctor if it is okay for you to use hot packs. °· Exercise as told by your doctor. °· Take a warm shower if you have stiffness in the morning. °· Move your sore joints throughout the day. °GET HELP RIGHT AWAY IF:  °· You have a fever. °· You have very bad joint pain, puffiness, or redness. °· You have many joints that are painful and puffy. °· You are not getting better with treatment. °· You have very bad back pain or leg weakness. °· You cannot control when you poop (bowel movement) or pee (urinate). °· You do not feel better in 24 hours or are getting worse. °· You are having side effects from your medicine. °MAKE SURE YOU:  °· Understand these instructions. °· Will watch your condition. °· Will get help right away if you are not doing well or get worse. °Document Released: 06/17/2009 Document Revised: 09/22/2011 Document Reviewed: 06/17/2009 °Exit[employer]® Patient Information ©2015 [employer]. This information is not intended to replace advice given to you by your health care provider. Make sure you discuss any questions you have with your health care provider. ° °

## 2013-12-28 NOTE — Progress Notes (Signed)
Subjective:    Patient ID: Sydney Dickson, female    DOB: [birth year], 57 y.o.   MRN: [id number]  HPI Comments: 58 YO WF CPE and presents for 3 month F/U for HTN, Cholesterol, DM, D. Deficient. She has had some difficulty with DM medications with increased arthralgias with Wilder Glade and Metformin in the past. She is not checking sugars routinely. She has tried to lose some weight but is not exercising routinely. She is active with work only. She notes BP is good at home.   He notes she had some improvement with stiffness in calves/ elbows/ arms when d/c DM medications but notes she still has some difficulty. Her alk phos is mildly elevated on most recent PRE-CPE labs but denies any recent fall or injury.  She has had mild increase in low pelvic pain/ bloating. She has chronic diarrhea/ constipation issues but is concerned with family history of Ovarian cancer. She is up to date on PAPs with last WNL.   She notes anxiety/ stress/ depression all controlled with medication.  WBC             7.1   12/26/2013 HGB            14.7   12/26/2013 HCT            43.3   12/26/2013 PLT             342   12/26/2013 GLUCOSE         145   12/26/2013 CHOL            192   12/26/2013 TRIG             89   12/26/2013 HDL              56   12/26/2013 LDLCALC         118   12/26/2013 ALT              18   12/26/2013 AST              17   12/26/2013 NA              139   12/26/2013 K               4.0   12/26/2013 CL              103   12/26/2013 CREATININE     0.76   12/26/2013 BUN              14   12/26/2013 CO2              25   12/26/2013 TSH           3.398   12/26/2013 HGBA1C          7.0   12/26/2013 MICROALBUR      0.6   12/26/2013      Medication List       This list is accurate as of: 12/28/13 11:59 PM.  Always use your most recent med list.               ALPRAZolam 0.5 MG tablet  Commonly known as:  XANAX  Take 1 tablet (0.5 mg total) by mouth 2 (two) times daily as needed.     bisoprolol-hydrochlorothiazide 10-6.25 MG per tablet  Commonly known as:  ZIAC  TAKE 1 TABLET BY MOUTH EVERY DAY FOR BLOOD PRESSURE     CRESTOR 20  MG tablet  Generic drug:  rosuvastatin  Take 1 tablet by mouth every other day.     cyclobenzaprine 5 MG tablet  Commonly known as:  FLEXERIL  Take 1 tablet (5 mg total) by mouth 2 (two) times daily.     Dapagliflozin Propanediol 10 MG Tabs  Commonly known as:  FARXIGA  1 po QD     escitalopram 20 MG tablet  Commonly known as:  LEXAPRO  TAKE 1 TABLET EVERY DAY     glucose blood test strip  Commonly known as:  FREESTYLE LITE  Use as instructed     ibuprofen 600 MG tablet  Commonly known as:  ADVIL,MOTRIN  Take 600 mg by mouth every 6 (six) hours as needed for pain.     levocetirizine 5 MG tablet  Commonly known as:  XYZAL  TAKE 1 TABLET EVERY DAY     lisdexamfetamine 40 MG capsule  Commonly known as:  VYVANSE  Take 1 capsule (40 mg total) by mouth daily.     potassium chloride 10 MEQ tablet  Commonly known as:  K-DUR  Take 1 tablet (10 mEq total) by mouth daily.       Allergies  Allergen Reactions  . Lidocaine     rash  . Metformin And Related     myalgia  . Morphine And Related   . Penicillins     Pt states that in 3rd grade she was given an injection in the buttock and reacted with a large hive in that area  . Wellbutrin [Bupropion]    Past Medical History  Diagnosis Date  . Hypertension   . ADD (attention deficit disorder)   . Depression   . IBS (irritable bowel syndrome)   . Hyperlipidemia 2004  . Diabetes mellitus without complication 1552   Past Surgical History  Procedure Laterality Date  . Hernia repair    . Cholecystectomy    . Tubal ligation    . Cosmetic surgery      rhinoplasty  . Rotator cuff repair Right 2012  . Eye surgery  2006    lasik  . Nissen fundoplication  0802  . Carpal tunnel release  1998, 20001   History  Substance Use Topics  . Smoking status: Never Smoker   . Smokeless  tobacco: Not on file  . Alcohol Use: No   Family History  Problem Relation Age of Onset  . Cancer Mother     breast  . Hyperlipidemia Mother   . Hypertension Mother   . Diabetes Mother   . Cancer Father     breast, colon, skin, prostate  . Hyperlipidemia Father   . Hypertension Father   . Cancer Sister     breast  . Cancer Brother 29    colon  . Cancer Paternal Grandmother     ovary  . Cancer Paternal Grandfather     colon    MAINTENANCE: Colonoscopy:2010 due 2015 Mammo:2014 WNL per pt BMD:  Pap/ Pelvic:9+/2013 wnl MVV:[id number] glasses Dentist: Overdue  IMMUNIZATIONS: Td:2007 Influenza:2014   Patient Care Team: Unk Pinto, MD as PCP - General (Internal Medicine) Mayme Genta, MD as Consulting Physician (Gastroenterology) Jannette Spanner, MD as Referring Physician (Dermatology) Amy Johnnye Sima, DO (Internal Medicine) Garald Balding, MD as Consulting Physician (Orthopedic Surgery) Eulis Manly. Gershon Crane, MD as Consulting Physician (Ophthalmology) Lurlean Nanny, (Derm)    Review of Systems  Constitutional: Positive for fatigue.  Respiratory: Negative for shortness of breath.   Cardiovascular: Negative for chest pain.  Gastrointestinal:  Positive for abdominal pain, diarrhea, constipation and abdominal distention.  Musculoskeletal: Positive for arthralgias and myalgias.  All other systems reviewed and are negative.  BP 126/68  Pulse 68  Temp(Src) 97.9 F (36.6 C)  Resp 18  Ht 5' 4.5" (1.638 m)  Wt 176 lb 12.8 oz (80.196 kg)  BMI 29.89 kg/m2     Objective:   Physical Exam  Nursing note and vitals reviewed. Constitutional: She is oriented to person, place, and time. She appears well-developed and well-nourished. No distress.  Overweight centrally  HENT:  Head: Normocephalic and atraumatic.  Right Ear: External ear normal.  Left Ear: External ear normal.  Nose: Nose normal.  Mouth/Throat: Oropharynx is clear and moist.  Eyes: Conjunctivae and EOM are  normal. Pupils are equal, round, and reactive to light. Right eye exhibits no discharge. Left eye exhibits no discharge. No scleral icterus.  Neck: Normal range of motion. Neck supple. No JVD present. No tracheal deviation present. No thyromegaly present.  Cardiovascular: Normal rate, regular rhythm, normal heart sounds and intact distal pulses.   Pulmonary/Chest: Effort normal and breath sounds normal.  Abdominal: Soft. Bowel sounds are normal. She exhibits no distension and no mass. There is no tenderness. There is no rebound and no guarding.  Genitourinary: Vagina normal and uterus normal. No vaginal discharge found.  Breasts WNL  Musculoskeletal: Normal range of motion. She exhibits no edema and no tenderness.  Lymphadenopathy:    She has no cervical adenopathy.  Neurological: She is alert and oriented to person, place, and time. She has normal reflexes. No cranial nerve deficit. She exhibits normal muscle tone. Coordination normal.  Skin: Skin is warm and dry. No rash noted. No erythema. No pallor.  3rd right toe 3 mm med Birdsell flat nevi, left thigh 5 mm med Oliveira nevi  Psychiatric: She has a normal mood and affect. Her behavior is normal. Judgment and thought content normal.     AORTA SCAN WNL EKG NSCSPT      Assessment & Plan:  1. CPE- Update screening labs/ History/ Immunizations/ Testing as needed. Advised healthy diet, QD exercise, increase H20 and continue RX/ Vitamins AD. Reviewed labs.   2.  3 month F/U for HTN, Cholesterol,DM, D. Deficient. Needs healthy diet, cardio QD and obtain healthy weight.  Check BP if >130/80 call office, Check BS if >200 call office. Consider starting injectable for DM, but prefer to wait until lab recheck in 2 weeks, CT and Refer to Rheum completed  3. Abdomen pain/ bloating/ constipation-CT scan to further evaluate with family history of female cancers  4. Myalgias/ arthralgias- With increased symptoms and NEG workup with labs in the past will  refer to Rheum for further eval. Refill of Flexeril for use only while at home. Recheck elevated ALK Phos in 2 weeks.   5.  Irreg Nevi- monitor for any change, call if occurs for removal

## 2014-01-01 LAB — TB SKIN TEST
INDURATION: 0 mm
TB Skin Test: NEGATIVE

## 2014-01-01 LAB — CYTOLOGY - PAP

## 2014-01-11 ENCOUNTER — Other Ambulatory Visit: Payer: Self-pay

## 2014-02-01 ENCOUNTER — Other Ambulatory Visit: Payer: Self-pay | Admitting: Emergency Medicine

## 2014-02-06 ENCOUNTER — Telehealth: Payer: Self-pay | Admitting: Internal Medicine

## 2014-02-12 ENCOUNTER — Other Ambulatory Visit: Payer: Self-pay | Admitting: Physician Assistant

## 2014-02-12 MED ORDER — LISDEXAMFETAMINE DIMESYLATE 40 MG PO CAPS: 40.0000 mg | ORAL_CAPSULE | Freq: Every day | ORAL | Status: DC

## 2014-02-14 ENCOUNTER — Other Ambulatory Visit: Payer: 59

## 2014-02-14 DIAGNOSIS — M791 Myalgia, unspecified site: Secondary | ICD-10-CM

## 2014-02-14 DIAGNOSIS — R748 Abnormal levels of other serum enzymes: Secondary | ICD-10-CM

## 2014-02-14 LAB — CK: CK TOTAL: 134 U/L (ref 7–177)

## 2014-02-14 LAB — HEPATIC FUNCTION PANEL
ALBUMIN: 4.2 g/dL (ref 3.5–5.2)
ALK PHOS: 113 U/L (ref 39–117)
ALT: 18 U/L (ref 0–35)
AST: 18 U/L (ref 0–37)
BILIRUBIN DIRECT: 0.1 mg/dL (ref 0.0–0.3)
BILIRUBIN TOTAL: 0.6 mg/dL (ref 0.2–1.2)
Indirect Bilirubin: 0.5 mg/dL (ref 0.2–1.2)
Total Protein: 7 g/dL (ref 6.0–8.3)

## 2014-02-21 ENCOUNTER — Other Ambulatory Visit: Payer: Self-pay | Admitting: Emergency Medicine

## 2014-02-21 ENCOUNTER — Other Ambulatory Visit: Payer: Self-pay | Admitting: Internal Medicine

## 2014-02-21 DIAGNOSIS — M62838 Other muscle spasm: Secondary | ICD-10-CM

## 2014-03-08 ENCOUNTER — Ambulatory Visit (INDEPENDENT_AMBULATORY_CARE_PROVIDER_SITE_OTHER): Payer: 59 | Admitting: Emergency Medicine

## 2014-03-08 ENCOUNTER — Encounter: Payer: Self-pay | Admitting: Emergency Medicine

## 2014-03-08 ENCOUNTER — Encounter: Payer: Self-pay | Admitting: Internal Medicine

## 2014-03-08 VITALS — BP 128/84 | HR 72 | Temp 98.0°F | Resp 18 | Ht 64.5 in | Wt 173.0 lb

## 2014-03-08 DIAGNOSIS — E119 Type 2 diabetes mellitus without complications: Secondary | ICD-10-CM

## 2014-03-08 DIAGNOSIS — I10 Essential (primary) hypertension: Secondary | ICD-10-CM

## 2014-03-08 DIAGNOSIS — E782 Mixed hyperlipidemia: Secondary | ICD-10-CM

## 2014-03-08 DIAGNOSIS — K625 Hemorrhage of anus and rectum: Secondary | ICD-10-CM

## 2014-03-08 DIAGNOSIS — Z23 Encounter for immunization: Secondary | ICD-10-CM

## 2014-03-08 LAB — CBC WITH DIFFERENTIAL/PLATELET
Basophils Absolute: 0.1 10*3/uL (ref 0.0–0.1)
Basophils Relative: 1 % (ref 0–1)
EOS ABS: 0.3 10*3/uL (ref 0.0–0.7)
EOS PCT: 4 % (ref 0–5)
HCT: 41.7 % (ref 36.0–46.0)
HEMOGLOBIN: 14.1 g/dL (ref 12.0–15.0)
Lymphocytes Relative: 40 % (ref 12–46)
Lymphs Abs: 3.4 10*3/uL (ref 0.7–4.0)
MCH: 28.7 pg (ref 26.0–34.0)
MCHC: 33.8 g/dL (ref 30.0–36.0)
MCV: 84.8 fL (ref 78.0–100.0)
MPV: 10.4 fL (ref 9.4–12.4)
Monocytes Absolute: 0.5 10*3/uL (ref 0.1–1.0)
Monocytes Relative: 6 % (ref 3–12)
Neutro Abs: 4.2 10*3/uL (ref 1.7–7.7)
Neutrophils Relative %: 49 % (ref 43–77)
Platelets: 314 10*3/uL (ref 150–400)
RBC: 4.92 MIL/uL (ref 3.87–5.11)
RDW: 14.1 % (ref 11.5–15.5)
WBC: 8.5 10*3/uL (ref 4.0–10.5)

## 2014-03-08 NOTE — Progress Notes (Signed)
Subjective:    Patient ID: Sydney Dickson, female    DOB: 1955/08/25, 58 y.o.   MRN: [id number]  HPI Comments: 58 yo WF with rectal bleeding QD x 6-8 weeks with hemorrhoid.She has tried preparation H without relief from hemorrhoid  She is also due for colonoscopy with polyp history x 2 scopes and father/ brother and paternal grandfather with colon cancer. Dr Earlean Shawl no longer takes insurance and has been trying to get in with Dr Hilarie Fredrickson x 6 weeks despite multiple attempts and even had record's transferred. She notes increased bowel gas.  She also presents for 3 month F/U for HTN, Cholesterol, DM, D. Deficient. She has restarted [place] for DM. She is trying to eat better. She is keeping busy. She has not been checking BS/BP.  Lab Results      Component                Value               Date                      WBC                      7.1                 12/26/2013                HGB                      14.7                12/26/2013                HCT                      43.3                12/26/2013                PLT                      342                 12/26/2013                GLUCOSE                  145*                12/26/2013                CHOL                     192                 12/26/2013                TRIG                     89                  12/26/2013                HDL                      56  12/26/2013                LDLCALC                  118*                12/26/2013                ALT                      18                  02/14/2014                AST                      18                  02/14/2014                NA                       139                 12/26/2013                K                        4.0                 12/26/2013                CL                       103                 12/26/2013                CREATININE               0.76                12/26/2013                BUN                      14                   12/26/2013                CO2                      25                  12/26/2013                TSH                      3.398               12/26/2013                HGBA1C                   7.0*                12/26/2013  MICROALBUR               0.6                 12/26/2013                  Medication List       This list is accurate as of: 03/08/14  1:59 PM.  Always use your most recent med list.               ALPRAZolam 0.5 MG tablet  Commonly known as:  XANAX  Take 1 tablet (0.5 mg total) by mouth 2 (two) times daily as needed.     bisoprolol-hydrochlorothiazide 10-6.25 MG per tablet  Commonly known as:  ZIAC  TAKE 1 TABLET BY MOUTH EVERY DAY FOR BLOOD PRESSURE     CRESTOR 20 MG tablet  Generic drug:  rosuvastatin  Take 1 tablet by mouth every other day.     cyclobenzaprine 5 MG tablet  Commonly known as:  FLEXERIL  Take 1/2 to 1 tablet 2 x day as needed for muscle spasm     dapagliflozin propanediol 10 MG Tabs tablet  Commonly known as:  FARXIGA  1 po QD     escitalopram 20 MG tablet  Commonly known as:  LEXAPRO  TAKE 1 TABLET EVERY DAY     glucose blood test strip  Commonly known as:  FREESTYLE LITE  Use as instructed     ibuprofen 600 MG tablet  Commonly known as:  ADVIL,MOTRIN  Take 600 mg by mouth every 6 (six) hours as needed for pain.     levocetirizine 5 MG tablet  Commonly known as:  XYZAL  TAKE 1 TABLET EVERY DAY     lisdexamfetamine 40 MG capsule  Commonly known as:  VYVANSE  Take 1 capsule (40 mg total) by mouth daily.     potassium chloride 10 MEQ tablet  Commonly known as:  K-DUR  Take 1 tablet (10 mEq total) by mouth daily.       Allergies  Allergen Reactions  . Lidocaine     rash  . Metformin And Related     myalgia  . Morphine And Related   . Penicillins     Pt states that in 3rd grade she was given an injection in the buttock and reacted with a large hive in that area  . Wellbutrin [Bupropion]       Review of Systems  Gastrointestinal: Positive for anal bleeding.  All other systems reviewed and are negative.  BP 128/84 mmHg  Pulse 72  Temp(Src) 98 F (36.7 C) (Temporal)  Resp 18  Ht 5' 4.5" (1.638 m)  Wt 173 lb (78.472 kg)  BMI 29.25 kg/m2     Objective:   Physical Exam  Constitutional: She is oriented to person, place, and time. She appears well-developed and well-nourished.  HENT:  Head: Normocephalic and atraumatic.  Left tonsil possible tonsil stone with mild erythema  Eyes: Conjunctivae are normal.  Neck: Normal range of motion.  Cardiovascular: Normal rate, regular rhythm, normal heart sounds and intact distal pulses.   Pulmonary/Chest: Effort normal and breath sounds normal.  Abdominal: Soft. Bowel sounds are normal. She exhibits no distension and no mass. There is no tenderness. There is no rebound and no guarding.  Genitourinary: Guaiac positive stool.     Musculoskeletal: Normal range of motion.  Neurological: She is alert and oriented to person, place, and time.  Skin: Skin is warm  and dry. No erythema.  Psychiatric: She has a normal mood and affect. Judgment normal.  Nursing note and vitals reviewed.         Assessment & Plan:   1. Rectal bleeding/ hemorrhoid/ polyp history with [facility] + (Father/ Brother)- REF ASAP GI [facility]YRTLE for rectal bleeding with hemorrhoid for further evaluation and update Colonoscopy.    2.Irreg Nevi proximal to anus- monitor for any change, call for removal with DERM  3.  3 month F/U for HTN, Cholesterol,DM, D. Deficient. Needs healthy diet, cardio QD and obtain healthy weight. Check Labs, Check BP if >130/80 call office, Check BS if >200 call office

## 2014-03-08 NOTE — Patient Instructions (Signed)
Rectal Bleeding  Rectal bleeding is when blood comes out of the opening of the butt (anus). Rectal bleeding may show up as bright red blood or really dark poop (stool). The poop may look dark red, maroon, or black. Rectal bleeding is often a sign that something is wrong. This needs to be checked by a doctor.  HOME CARE  Eat a diet high in fiber. This will help keep your poop soft.  Limit activity.  Drink enough fluids to keep your pee (urine) clear or pale yellow.  Take a warm bath to soothe any pain.  Follow up with your doctor as told. GET HELP RIGHT AWAY IF:  You have more bleeding.  You have black or dark red poop.  You throw up (vomit) blood or it looks like coffee grounds.  You have belly (abdominal) pain or tenderness.  You have a fever.  You feel weak, sick to your stomach (nauseous), or you pass out (faint).  You have pain that is so bad you cannot poop (bowel movement). MAKE SURE YOU:  Understand these instructions.  Will watch your condition.  Will get help right away if you are not doing well or get worse. Document Released: 12/03/2010 Document Revised: 08/07/2013 Document Reviewed: 12/03/2010 [facility] Patient Information 2015 [place], [place]. This information is not intended to replace advice given to you by your health care provider. Make sure you discuss any questions you have with your health care provider.

## 2014-03-09 LAB — HEPATIC FUNCTION PANEL
ALK PHOS: 106 U/L (ref 39–117)
ALT: 15 U/L (ref 0–35)
AST: 16 U/L (ref 0–37)
Albumin: 3.9 g/dL (ref 3.5–5.2)
BILIRUBIN DIRECT: 0.1 mg/dL (ref 0.0–0.3)
Indirect Bilirubin: 0.4 mg/dL (ref 0.2–1.2)
TOTAL PROTEIN: 6.8 g/dL (ref 6.0–8.3)
Total Bilirubin: 0.5 mg/dL (ref 0.2–1.2)

## 2014-03-09 LAB — BASIC METABOLIC PANEL WITH GFR
BUN: 13 mg/dL (ref 6–23)
CALCIUM: 9.4 mg/dL (ref 8.4–10.5)
CO2: 24 mEq/L (ref 19–32)
Chloride: 106 mEq/L (ref 96–112)
Creat: 0.62 mg/dL (ref 0.50–1.10)
GFR, Est African American: >89 mL/min
GFR, Est Non African American: >89 mL/min
GLUCOSE: 127 mg/dL — AB (ref 70–99)
Potassium: 3.2 mEq/L — ABNORMAL LOW (ref 3.5–5.3)
Sodium: 142 mEq/L (ref 135–145)

## 2014-03-09 LAB — LIPID PANEL
Cholesterol: 139 mg/dL (ref 0–200)
HDL: 50 mg/dL (ref >39)
LDL CALC: 66 mg/dL (ref 0–99)
Total CHOL/HDL Ratio: 2.8 Ratio
Triglycerides: 113 mg/dL (ref <150)
VLDL: 23 mg/dL (ref 0–40)

## 2014-03-09 LAB — INSULIN, FASTING: Insulin fasting, serum: 13.4 u[IU]/mL (ref 2.0–19.6)

## 2014-03-09 LAB — IRON AND TIBC
%SAT: 21 % (ref 20–55)
Iron: 77 ug/dL (ref 42–145)
TIBC: 365 ug/dL (ref 250–470)
UIBC: 288 ug/dL (ref 125–400)

## 2014-03-09 LAB — HEMOGLOBIN A1C
Hgb A1c MFr Bld: 6.5 % — ABNORMAL HIGH (ref <5.7)
Mean Plasma Glucose: 140 mg/dL — ABNORMAL HIGH (ref <117)

## 2014-03-13 ENCOUNTER — Encounter: Payer: Self-pay | Admitting: *Deleted

## 2014-03-20 ENCOUNTER — Encounter: Payer: Self-pay | Admitting: Physician Assistant

## 2014-03-20 ENCOUNTER — Ambulatory Visit (INDEPENDENT_AMBULATORY_CARE_PROVIDER_SITE_OTHER): Payer: 59 | Admitting: Physician Assistant

## 2014-03-20 VITALS — BP 138/86 | HR 68 | Temp 98.2°F | Resp 18 | Ht 64.5 in | Wt 174.0 lb

## 2014-03-20 DIAGNOSIS — J069 Acute upper respiratory infection, unspecified: Secondary | ICD-10-CM

## 2014-03-20 MED ORDER — PREDNISONE 20 MG PO TABS: ORAL_TABLET | ORAL | Status: DC

## 2014-03-20 MED ORDER — MOMETASONE FUROATE 50 MCG/ACT NA SUSP: 2.0000 | Freq: Every day | NASAL | Status: DC

## 2014-03-20 MED ORDER — AZITHROMYCIN 250 MG PO TABS: ORAL_TABLET | ORAL | Status: DC

## 2014-03-20 MED ORDER — PROMETHAZINE-CODEINE 6.25-10 MG/5ML PO SYRP
5.0000 mL | ORAL_SOLUTION | Freq: Four times a day (QID) | ORAL | Status: DC | PRN
Start: 2014-03-20 — End: 2014-05-14

## 2014-03-20 MED ORDER — DAPAGLIFLOZIN PROPANEDIOL 10 MG PO TABS: ORAL_TABLET | ORAL | Status: DC

## 2014-03-20 NOTE — Patient Instructions (Signed)
-Please take Tylenol or Ibuprofen for pain. -Acetaminiphen 325mg orally every 4-6 hours for pain.  Max: 10 per day -Ibuprofen 200mg orally every 6-8 hours for pain.  Take with food to avoid ulcers.   Max 10 per day  Please pick one of the over the counter allergy medications below and take it once daily for allergies.  Claritin or loratadine cheapest but likely the weakest  Zyrtec or certizine at night because it can make you sleepy The strongest is allegra or fexafinadine  Cheapest at [employer], [employer], [employer]  -While drinking fluids, pinch and hold nose close and swallow.  This will help open up your eustachian tubes to drain the fluid behind your ear drums. -Try steam showers to open your nasal passages.   Drink lots of water to stay hydrated and to thin mucous.  Flonase/Nasonex is to help the inflammation.  Take 2 sprays in each nostril at bedtime.  Make sure you spray towards the outside of each nostril towards the outer corner of your eye, hold nose close and tilt head back.  This will help the medication get into your sinuses.  If you do not like this medication, then use saline nasal sprays same directions as above for Flonase. Stop the medication right away if you get blurring of your vision or nose bleeds.  -It can take up to 2 weeks to feel better.  Sinusitis is mostly caused by viruses.  -If you do not get better in 7-10 days (Have fever, facial pain, dental pain and swelling), then please call the office and I can send in an antibiotic.  Sinusitis Sinusitis is redness, soreness, and inflammation of the paranasal sinuses. Paranasal sinuses are air pockets within the bones of your face (beneath the eyes, the middle of the forehead, or above the eyes). In healthy paranasal sinuses, mucus is able to drain out, and air is able to circulate through them by way of your nose. However, when your paranasal sinuses are inflamed, mucus and air can become trapped. This can allow bacteria and other  germs to grow and cause infection. Sinusitis can develop quickly and last only a short time (acute) or continue over a long period (chronic). Sinusitis that lasts for more than 12 weeks is considered chronic.  CAUSES  Causes of sinusitis include:  Allergies.  Structural abnormalities, such as displacement of the cartilage that separates your nostrils (deviated septum), which can decrease the air flow through your nose and sinuses and affect sinus drainage.  Functional abnormalities, such as when the small hairs (cilia) that line your sinuses and help remove mucus do not work properly or are not present. SIGNS AND SYMPTOMS  Symptoms of acute and chronic sinusitis are the same. The primary symptoms are pain and pressure around the affected sinuses. Other symptoms include:  Upper toothache.  Earache.  Headache.  Bad breath.  Decreased sense of smell and taste.  A cough, which worsens when you are lying flat.  Fatigue.  Fever.  Thick drainage from your nose, which often is green and may contain pus (purulent).  Swelling and warmth over the affected sinuses. DIAGNOSIS  Your health care provider will perform a physical exam. During the exam, your health care provider may:  Look in your nose for signs of abnormal growths in your nostrils (nasal polyps).  Tap over the affected sinus to check for signs of infection.  View the inside of your sinuses (endoscopy) using an imaging device that has a light attached (endoscope). If your   health care provider suspects that you have chronic sinusitis, one or more of the following tests may be recommended:  Allergy tests.  Nasal culture. A sample of mucus is taken from your nose, sent to a lab, and screened for bacteria.  Nasal cytology. A sample of mucus is taken from your nose and examined by your health care provider to determine if your sinusitis is related to an allergy. TREATMENT  Most cases of acute sinusitis are related to a viral  infection and will resolve on their own within 10 days. Sometimes medicines are prescribed to help relieve symptoms (pain medicine, decongestants, nasal steroid sprays, or saline sprays).  However, for sinusitis related to a bacterial infection, your health care provider will prescribe antibiotic medicines. These are medicines that will help kill the bacteria causing the infection.  Rarely, sinusitis is caused by a fungal infection. In theses cases, your health care provider will prescribe antifungal medicine. For some cases of chronic sinusitis, surgery is needed. Generally, these are cases in which sinusitis recurs more than 3 times per year, despite other treatments. HOME CARE INSTRUCTIONS   Drink plenty of water. Water helps thin the mucus so your sinuses can drain more easily.  Use a humidifier.  Inhale steam 3 to 4 times a day (for example, sit in the bathroom with the shower running).  Apply a warm, moist washcloth to your face 3 to 4 times a day, or as directed by your health care provider.  Use saline nasal sprays to help moisten and clean your sinuses.  Take medicines only as directed by your health care provider.  If you were prescribed either an antibiotic or antifungal medicine, finish it all even if you start to feel better. SEEK IMMEDIATE MEDICAL CARE IF:  You have increasing pain or severe headaches.  You have nausea, vomiting, or drowsiness.  You have swelling around your face.  You have vision problems.  You have a stiff neck.  You have difficulty breathing. MAKE SURE YOU:   Understand these instructions.  Will watch your condition.  Will get help right away if you are not doing well or get worse. Document Released: 03/23/2005 Document Revised: 08/07/2013 Document Reviewed: 04/07/2011 [employer] Patient Information 2015 [employer]. This information is not intended to replace advice given to you by your health care provider. Make sure you discuss any  questions you have with your health care provider.  

## 2014-03-20 NOTE — Progress Notes (Signed)
   Subjective:    Patient ID: Sydney Dickson, female    DOB: 1955-11-09, 58 y.o.   MRN: [id number]  URI  This is a new problem. Episode onset: 2-3 days. The problem has been gradually worsening. There has been no fever. Associated symptoms include congestion, coughing, headaches, a plugged ear sensation, rhinorrhea, sinus pain and a sore throat. Pertinent negatives include no abdominal pain, chest pain, diarrhea, dysuria, ear pain, joint pain, joint swelling, nausea, neck pain, rash, sneezing, swollen glands, vomiting or wheezing. She has tried acetaminophen for the symptoms. The treatment provided no relief.    Review of Systems  Constitutional: Positive for fatigue. Negative for fever, chills, diaphoresis, activity change and appetite change.  HENT: Positive for congestion, postnasal drip, rhinorrhea, sinus pressure and sore throat. Negative for ear pain and sneezing.   Eyes: Negative.   Respiratory: Positive for cough. Negative for chest tightness, shortness of breath and wheezing.   Cardiovascular: Negative.  Negative for chest pain.  Gastrointestinal: Negative.  Negative for nausea, vomiting, abdominal pain and diarrhea.  Genitourinary: Negative.  Negative for dysuria.  Musculoskeletal: Negative for joint pain and neck pain.  Skin: Negative for rash.  Neurological: Positive for headaches.       Objective:   Physical Exam  Constitutional: She is oriented to person, place, and time. She appears well-developed and well-nourished.  HENT:  Right Ear: Hearing and external ear normal. No mastoid tenderness. Tympanic membrane is injected. Tympanic membrane is not perforated, not erythematous, not retracted and not bulging. A middle ear effusion is present.  Left Ear: Hearing and external ear normal. No mastoid tenderness. Tympanic membrane is injected. Tympanic membrane is not perforated, not erythematous, not retracted and not bulging. A middle ear effusion is present.  Nose: Right sinus  exhibits maxillary sinus tenderness. Left sinus exhibits maxillary sinus tenderness.  Mouth/Throat: Uvula is midline, oropharynx is clear and moist and mucous membranes are normal.  Eyes: Conjunctivae and EOM are normal. Pupils are equal, round, and reactive to light.  Neck: Neck supple.  Cardiovascular: Normal rate and regular rhythm.   Pulmonary/Chest: Effort normal and breath sounds normal. No respiratory distress. She has no wheezes.  Abdominal: Soft. Bowel sounds are normal.  Musculoskeletal: Normal range of motion.  Lymphadenopathy:    She has no cervical adenopathy.  Neurological: She is alert and oriented to person, place, and time.  Skin: Skin is warm and dry.       Assessment & Plan:  1. Upper respiratory tract infection Will hold the zpak and take if she is not getting better, increase fluids, rest, cont allergy pill, nasonex - predniSONE (DELTASONE) 20 MG tablet; Take one pill two times daily for 3 days, take one pill daily for 4 days.  Dispense: 10 tablet; Refill: 0 - azithromycin (ZITHROMAX) 250 MG tablet; 2 tablets by mouth today then one tablet daily for 4 days.  Dispense: 6 tablet; Refill: 1 - promethazine-codeine (PHENERGAN WITH CODEINE) 6.25-10 MG/5ML syrup; Take 5 mLs by mouth every 6 (six) hours as needed for cough.  Dispense: 240 mL; Refill: 0 - mometasone (NASONEX) 50 MCG/ACT nasal spray; Place 2 sprays into the nose daily.  Dispense: 17 g; Refill: 0

## 2014-03-28 ENCOUNTER — Other Ambulatory Visit: Payer: Self-pay | Admitting: Physician Assistant

## 2014-04-16 NOTE — Telephone Encounter (Signed)
Pt scheduled  

## 2014-05-14 ENCOUNTER — Ambulatory Visit (INDEPENDENT_AMBULATORY_CARE_PROVIDER_SITE_OTHER): Payer: 59 | Admitting: Emergency Medicine

## 2014-05-14 ENCOUNTER — Ambulatory Visit (AMBULATORY_SURGERY_CENTER): Payer: Self-pay | Admitting: *Deleted

## 2014-05-14 ENCOUNTER — Encounter: Payer: Self-pay | Admitting: Emergency Medicine

## 2014-05-14 VITALS — BP 122/70 | HR 62 | Temp 98.0°F | Resp 18 | Ht 64.5 in | Wt 172.0 lb

## 2014-05-14 VITALS — Ht 64.5 in | Wt 170.0 lb

## 2014-05-14 DIAGNOSIS — F909 Attention-deficit hyperactivity disorder, unspecified type: Secondary | ICD-10-CM

## 2014-05-14 DIAGNOSIS — Z800 Family history of malignant neoplasm of digestive organs: Secondary | ICD-10-CM

## 2014-05-14 DIAGNOSIS — E876 Hypokalemia: Secondary | ICD-10-CM

## 2014-05-14 DIAGNOSIS — J302 Other seasonal allergic rhinitis: Secondary | ICD-10-CM

## 2014-05-14 DIAGNOSIS — F988 Other specified behavioral and emotional disorders with onset usually occurring in childhood and adolescence: Secondary | ICD-10-CM

## 2014-05-14 LAB — BASIC METABOLIC PANEL WITH GFR
BUN: 16 mg/dL (ref 6–23)
CO2: 28 meq/L (ref 19–32)
Calcium: 9.2 mg/dL (ref 8.4–10.5)
Chloride: 107 mEq/L (ref 96–112)
Creat: 0.68 mg/dL (ref 0.50–1.10)
GFR, Est African American: >89 mL/min
GFR, Est Non African American: >89 mL/min
Glucose, Bld: 190 mg/dL — ABNORMAL HIGH (ref 70–99)
Potassium: 3.7 mEq/L (ref 3.5–5.3)
SODIUM: 141 meq/L (ref 135–145)

## 2014-05-14 MED ORDER — LISDEXAMFETAMINE DIMESYLATE 40 MG PO CAPS: 40.0000 mg | ORAL_CAPSULE | Freq: Every day | ORAL | Status: DC

## 2014-05-14 MED ORDER — LEVOCETIRIZINE DIHYDROCHLORIDE 5 MG PO TABS: 5.0000 mg | ORAL_TABLET | Freq: Every day | ORAL | Status: DC

## 2014-05-14 MED ORDER — MOVIPREP 100 G PO SOLR: ORAL | Status: DC

## 2014-05-14 MED ORDER — IBUPROFEN 800 MG PO TABS: 800.0000 mg | ORAL_TABLET | Freq: Three times a day (TID) | ORAL | Status: DC | PRN

## 2014-05-14 NOTE — Patient Instructions (Signed)

## 2014-05-14 NOTE — Progress Notes (Signed)
Patient denies any allergies to soy. Patient states per allergy testing allergic to eggs, but eats them per pt. Pt does get flu shot with no problems per pt. Patient denies any problems with anesthesia/sedation. Patient denies any oxygen use at home and does not take any diet/weight loss medications. EMMI education assisgned to patient on colonoscopy, this was explained and instructions given to patient.

## 2014-05-14 NOTE — Progress Notes (Signed)
Subjective:     Patient ID: Sydney Dickson, female   DOB: [birth year], 59 y.o.   MRN: [id number]  HPI Comments: 59 yo WF needs refills on medicine and recheck of potassium. She still has leg cramping but has not been taking RX Potassium. SHE IS KEEPING BUSY .She is due for 3 month f/u in late March/ April.  LAST Potassium was 3.2  She is on Vyvanse for ADD and notes + improvement with focus on RX. She denies any adverse SE with medication. She is required to have 3 month supply filled per insurance.   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 She does not take TID only PRN. She denies any new concerns with myalgias/ arthralgias. She does note still with blood with BM and has colonoscopy with in the month.   Lab Results      Component                Value               Date                      WBC                      8.5                 03/08/2014                HGB                      14.1                03/08/2014                HCT                      41.7                03/08/2014                PLT                      314                 03/08/2014                GLUCOSE                  127*                03/08/2014                CHOL                     139                 03/08/2014                TRIG                     113                 03/08/2014                HDL  50                  03/08/2014                LDLCALC                  66                  03/08/2014                ALT                      15                  03/08/2014                AST                      16                  03/08/2014                NA                       142                 03/08/2014                K                        3.2*                03/08/2014                CL                       106                 03/08/2014                CREATININE               0.62                03/08/2014                BUN                       13                  03/08/2014                CO2                      24                  03/08/2014                TSH                      3.398               12/26/2013                HGBA1C                   6.5*  03/08/2014                MICROALBUR               0.6                 12/26/2013             Current Outpatient Prescriptions on File Prior to Visit  Medication Sig Dispense Refill  . ALPRAZolam (XANAX) 0.5 MG tablet Take 1 tablet (0.5 mg total) by mouth 2 (two) times daily as needed. 60 tablet 0  . bisoprolol-hydrochlorothiazide (ZIAC) 10-6.25 MG per tablet TAKE 1 TABLET BY MOUTH EVERY DAY FOR BLOOD PRESSURE 90 tablet 1  . CRESTOR 20 MG tablet Take 1 tablet by mouth every other day.    . cyclobenzaprine (FLEXERIL) 5 MG tablet Take 1/2 to 1 tablet 2 x day as needed for muscle spasm 60 tablet 2  . dapagliflozin propanediol (FARXIGA) 10 MG TABS tablet 1 po QD 90 tablet 0  . escitalopram (LEXAPRO) 20 MG tablet TAKE 1 TABLET EVERY DAY 90 tablet 0  . glucose blood (FREESTYLE LITE) test strip Use as instructed 100 each 12  . levocetirizine (XYZAL) 5 MG tablet TAKE 1 TABLET EVERY DAY 90 tablet 3  . lisdexamfetamine (VYVANSE) 40 MG capsule Take 1 capsule (40 mg total) by mouth daily. 90 capsule 0  . mometasone (NASONEX) 50 MCG/ACT nasal spray Place 2 sprays into the nose daily. 17 g 0  . potassium chloride (K-DUR) 10 MEQ tablet Take 1 tablet (10 mEq total) by mouth daily. 30 tablet 6   No current facility-administered medications on file prior to visit.   Allergies  Allergen Reactions  . Lidocaine     rash  . Metformin And Related     myalgia  . Morphine And Related   . Penicillins     Pt states that in 3rd grade she was given an injection in the buttock and reacted with a large hive in that area  . Wellbutrin [Bupropion]      Review of Systems  Constitutional: Negative for fatigue.  Gastrointestinal: Positive for blood in stool.   Musculoskeletal: Positive for myalgias.  All other systems reviewed and are negative.     BP 122/70 mmHg  Pulse 62  Temp(Src) 98 F (36.7 C) (Temporal)  Resp 18  Ht 5' 4.5" (1.638 m)  Wt 172 lb (78.019 kg)  BMI 29.08 kg/m2  SpO2 98%  Objective:   Physical Exam  Constitutional: She appears well-developed and well-nourished. No distress.  HENT:  Head: Atraumatic.  Eyes: Conjunctivae and EOM are normal.  Neck: Normal range of motion. Neck supple. No thyromegaly present.  Cardiovascular: Normal rate, regular rhythm, normal heart sounds and intact distal pulses.   Pulmonary/Chest: Effort normal and breath sounds normal.  Abdominal: Soft. Bowel sounds are normal. She exhibits no distension. There is no tenderness.  Musculoskeletal: Normal range of motion. She exhibits no edema or tenderness.  Lymphadenopathy:    She has no cervical adenopathy.  Neurological: She is alert. No cranial nerve deficit.  Skin: Skin is warm and dry. No erythema. No pallor.  Psychiatric: She has a normal mood and affect. Her behavior is normal. Judgment and thought content normal.  Nursing note and vitals reviewed.      Assessment:     Hypokalemia - Plan: BMP with Estimated GFR ([id number])  ADD (attention deficit disorder)  Other seasonal allergic rhinitis       Plan:  Refill/ Conitnue  medicines AD for allergies/ ADD/ Myalgias  Recheck Labs AD for hypokalemia.  Advised if symptoms with cramping continue needs to restart RX potassium. w/c if SX increase or ER.   Advised keep GI appointment for colonoscopy with continued rectal bleeding and try to minimize Ibuprofen use. Will Need Chol/ DM labs End of March/ mid April.

## 2014-05-28 ENCOUNTER — Ambulatory Visit (AMBULATORY_SURGERY_CENTER): Payer: Managed Care, Other (non HMO) | Admitting: Internal Medicine

## 2014-05-28 ENCOUNTER — Encounter: Payer: Self-pay | Admitting: Internal Medicine

## 2014-05-28 VITALS — BP 110/67 | HR 52 | Temp 97.7°F | Resp 16 | Ht 64.0 in | Wt 170.0 lb

## 2014-05-28 DIAGNOSIS — D129 Benign neoplasm of anus and anal canal: Secondary | ICD-10-CM

## 2014-05-28 DIAGNOSIS — Z1211 Encounter for screening for malignant neoplasm of colon: Secondary | ICD-10-CM

## 2014-05-28 DIAGNOSIS — D120 Benign neoplasm of cecum: Secondary | ICD-10-CM

## 2014-05-28 DIAGNOSIS — D128 Benign neoplasm of rectum: Secondary | ICD-10-CM

## 2014-05-28 DIAGNOSIS — Z800 Family history of malignant neoplasm of digestive organs: Secondary | ICD-10-CM

## 2014-05-28 DIAGNOSIS — D490 Neoplasm of unspecified behavior of digestive system: Secondary | ICD-10-CM

## 2014-05-28 DIAGNOSIS — D379 Neoplasm of uncertain behavior of digestive organ, unspecified: Secondary | ICD-10-CM

## 2014-05-28 LAB — GLUCOSE, CAPILLARY
GLUCOSE-CAPILLARY: 104 mg/dL — AB (ref 70–99)
Glucose-Capillary: 129 mg/dL — ABNORMAL HIGH (ref 70–99)

## 2014-05-28 MED ORDER — SODIUM CHLORIDE 0.9 % IV SOLN: 500.0000 mL | INTRAVENOUS | Status: DC

## 2014-05-28 NOTE — Progress Notes (Signed)
A/ox3, pleased with MAC, report to RN 

## 2014-05-28 NOTE — Patient Instructions (Addendum)
YOU MAY SEE SOME BLOOD TODAY AND TOMORROW RELATED TO BIOPSIES. DR. Vena Rua OFFICE WILL CALL YOU APPOINTMENT WITH [facility].  YOU HAD AN ENDOSCOPIC PROCEDURE TODAY AT [facility]: Refer to the procedure report that was given to you for any specific questions about what was found during the examination.  If the procedure report does not answer your questions, please call your gastroenterologist to clarify.  If you requested that your care partner not be given the details of your procedure findings, then the procedure report has been included in a sealed envelope for you to review at your convenience later.  YOU SHOULD EXPECT: Some feelings of bloating in the abdomen. Passage of more gas than usual.  Walking can help get rid of the air that was put into your GI tract during the procedure and reduce the bloating. If you had a lower endoscopy (such as a colonoscopy or flexible sigmoidoscopy) you may notice spotting of blood in your stool or on the toilet paper. If you underwent a bowel prep for your procedure, then you may not have a normal bowel movement for a few days.  DIET: Your first meal following the procedure should be a light meal and then it is ok to progress to your normal diet.  A half-sandwich or bowl of soup is an example of a good first meal.  Heavy or fried foods are harder to digest and may make you feel nauseous or bloated.  Likewise meals heavy in dairy and vegetables can cause extra gas to form and this can also increase the bloating.  Drink plenty of fluids but you should avoid alcoholic beverages for 24 hours.  ACTIVITY: Your care partner should take you home directly after the procedure.  You should plan to take it easy, moving slowly for the rest of the day.  You can resume normal activity the day after the procedure however you should NOT DRIVE or use heavy machinery for 24 hours (because of the sedation medicines used during the test).    SYMPTOMS TO  REPORT IMMEDIATELY: A gastroenterologist can be reached at any hour.  During normal business hours, 8:30 AM to 5:00 PM Monday through Friday, call [phone].  After hours and on weekends, please call the GI answering service at [phone] who will take a message and have the physician on call contact you.   Following lower endoscopy (colonoscopy or flexible sigmoidoscopy):  Excessive amounts of blood in the stool  Significant tenderness or worsening of abdominal pains  Swelling of the abdomen that is new, acute  Fever of 100F or higher  FOLLOW UP: If any biopsies were taken you will be contacted by phone or by letter within the next 1-3 weeks.  Call your gastroenterologist if you have not heard about the biopsies in 3 weeks.  Our staff will call the home number listed on your records the next business day following your procedure to check on you and address any questions or concerns that you may have at that time regarding the information given to you following your procedure. This is a courtesy call and so if there is no answer at the home number and we have not heard from you through the emergency physician on call, we will assume that you have returned to your regular daily activities without incident.  SIGNATURES/CONFIDENTIALITY: You and/or your care partner have signed paperwork which will be entered into your electronic medical record.  These signatures attest to the fact that  that the information above on your After Visit Summary has been reviewed and is understood.  Full responsibility of the confidentiality of this discharge information lies with you and/or your care-partner.

## 2014-05-28 NOTE — Progress Notes (Signed)
Called to room to assist during endoscopic procedure.  Patient ID and intended procedure confirmed with present staff. Received instructions for my participation in the procedure from the performing physician.  

## 2014-05-28 NOTE — Op Note (Signed)
[facility]  [address] [place], [id number]   COLONOSCOPY PROCEDURE REPORT  PATIENT: Sydney Dickson, Sydney Dickson  MR#: [id number] BIRTHDATE: 05/15/1955 , 24  yrs. old GENDER: female ENDOSCOPIST: Jerene Bears, MD PROCEDURE DATE:  05/28/2014 PROCEDURE:   Colonoscopy with biopsy and Colonoscopy with cold biopsy polypectomy First Screening Colonoscopy - Avg.  risk and is 50 yrs.  old or older - No.  Prior Negative Screening - Now for repeat screening. Less than 10 yrs Prior Negative Screening - Now for repeat screening.  Above average risk  History of Adenoma - Now for follow-up colonoscopy & has been > or = to 3 yrs.  N/A  Polyps Removed Today? Yes. ASA CLASS:   Class II INDICATIONS:patient's immediate family history of colon cancer (grandfather, father, and brother), last colonoscopy 12/19/2008 with Dr. Earlean Shawl MEDICATIONS: Monitored anesthesia care and Propofol 300 mg IV  DESCRIPTION OF PROCEDURE:   After the risks benefits and alternatives of the procedure were thoroughly explained, informed consent was obtained.  The digital rectal exam revealed a palpable rectal mass.   The LB [id number]  endoscope was introduced through the anus and advanced to the cecum, which was identified by both the appendix and ileocecal valve. No adverse events experienced.   The quality of the prep was good, using MoviPrep The instrument was then slowly withdrawn as the colon was fully examined.   COLON FINDINGS: A sessile polyp measuring 3 mm in size was found at the cecum.  A polypectomy was performed with cold forceps.  The resection was complete, the polyp tissue was completely retrieved and sent to histology.   A firm semi-pedunculated polyp measuring 3 cm in size was found in the very distal rectum approximating the dentate line.  Multiple biopsies were performed using cold forceps. Sample was obtained and sent to histology.  Retroflexed views revealed previously described polyp  and small internal hemorrhoids. The time to cecum=7 minutes 01 seconds.  Withdrawal time=13 minutes 19 seconds.  The scope was withdrawn and the procedure completed.  COMPLICATIONS: There were no immediate complications.  ENDOSCOPIC IMPRESSION: 1.   Sessile polyp was found at the cecum; polypectomy was performed with cold forceps 2.   Semi-pedunculated polyp measuring 3 cm in size was found in the distal rectum very close to the dentate line; multiple biopsies were performed using cold forceps  RECOMMENDATIONS: 1.  Await biopsy results 2.  Surgical referral for transanal excision of very distal rectal polyp  eSigned:  Jerene Bears, MD 05/28/2014 11:34 AM  cc: Unk Pinto, MD and The Patient

## 2014-05-29 ENCOUNTER — Telehealth: Payer: Self-pay | Admitting: *Deleted

## 2014-05-29 NOTE — Telephone Encounter (Signed)
  Follow up Call-  Call back number 05/28/2014  Post procedure Call Back phone  # [phone]  Permission to leave phone message Yes     Patient questions:  Do you have a fever, pain , or abdominal swelling? No. Pain Score  0 *  Have you tolerated food without any problems? Yes.    Have you been able to return to your normal activities? Yes.    Do you have any questions about your discharge instructions: Diet   No. Medications  No. Follow up visit  No.  Do you have questions or concerns about your Care? No.  Actions: * If pain score is 4 or above: No action needed, pain <4.

## 2014-05-30 ENCOUNTER — Telehealth: Payer: Self-pay

## 2014-05-30 ENCOUNTER — Other Ambulatory Visit: Payer: Self-pay | Admitting: Emergency Medicine

## 2014-05-30 ENCOUNTER — Encounter: Payer: Self-pay | Admitting: Internal Medicine

## 2014-05-30 NOTE — Telephone Encounter (Signed)
Pt scheduled to see Dr. Johney Maine with [facility] 06/14/14, pt to be there at 9am. Pt aware of appt. For transanal excision for distal rectal polyp.

## 2014-06-14 ENCOUNTER — Other Ambulatory Visit (INDEPENDENT_AMBULATORY_CARE_PROVIDER_SITE_OTHER): Payer: Self-pay | Admitting: Surgery

## 2014-06-14 NOTE — H&P (Addendum)
Sydney Dickson 06/14/2014 9:45 AM Location: [facility] Patient #: [id number] DOB: May 20, 1955 Married / Language: Sydney Dickson / Race: White Female History of Present Illness [name] MD; 06/14/2014 10:49 AM) Patient words: rectal polyp.  The patient is a 59 year old female who presents with a colonic polyp. Patient sent by her gastroenterologist, Dr. Zenovia Jarred, for concern of very distal rectal adenomatous polyp. Not able to be removed safely endoscopically. Consider transanal resection. Pleasant active woman. History of Nissen fundoplication and cholecystectomy. No other abdominal surgeries. History colon polyps. Underwent colonoscopy. Polyps removed. Pedunculated distal rectal polyp mass noted. Felt to distal to be removed endoscopically. Surgical consultation requested for transanal excision. Patient has some mild irritable bowel. Often has a few bowel today. Not severe. No severe rectal bleeding or melena. Comes today with her husband. Did have a bad rash with her shoulder surgery. They told her she had jock itch on the shoulder which confuses her. No history of groin or candidal rashes or infections that she knows of. No history of MRSA. Has had mild incontinence to flatus and sometimes even stools with her irritable bowel. Not severe. Other Problems Sydney Dickson, CMA; 06/14/2014 9:45 AM) Anxiety Disorder Back Pain Cholelithiasis Depression Diabetes Mellitus Gastroesophageal Reflux Disease Hemorrhoids High blood pressure Hypercholesterolemia Migraine Headache Ventral Hernia Repair  Past Surgical History Sydney Dickson, CMA; 06/14/2014 9:45 AM) Colon Polyp Removal - Colonoscopy Gallbladder Surgery - Laparoscopic Nissen Fundoplication Shoulder Surgery Right. Ventral / Umbilical Hernia Surgery Left.  Diagnostic Studies History Sydney Dickson, CMA; 06/14/2014 9:45 AM) Colonoscopy within last year Mammogram within last year Pap Smear 1-5  years ago  Allergies Sydney Dickson, CMA; 06/14/2014 9:47 AM) Lidocaine (5%)/Dextrose 7.5% *LOCAL ANESTHETICS-Parenteral* MetFORMIN HCl *ANTIDIABETICS* Morphine Sulfate (Concentrate) *ANALGESICS - OPIOID* Penicillamine *ASSORTED CLASSES* Wellbutrin *ANTIDEPRESSANTS*  Medication History (Sydney Dickson, CMA; 06/14/2014 9:48 AM) Vyvanse (40MG  Capsule, Oral) Active. ALPRAZolam (0.5MG  Tablet, Oral) Active. Bisoprolol-Hydrochlorothiazide (10-6.25MG  Tablet, Oral) Active. Cyclobenzaprine HCl (5MG  Tablet, Oral) Active. Escitalopram Oxalate (20MG  Tablet, Oral) Active. Wilder Glade (10MG  Tablet, Oral) Active. Levocetirizine Dihydrochloride (5MG  Tablet, Oral) Active. Medications Reconciled  Social History Sydney Dickson, CMA; 06/14/2014 9:45 AM) Alcohol use Occasional alcohol use. Caffeine use Carbonated beverages, Tea. No drug use Tobacco use Never smoker.  Family History Sydney Dickson, CMA; 06/14/2014 9:45 AM) Arthritis Father, Mother. Breast Cancer Father, Mother. Cancer Father, Mother, Sister. Cervical Cancer Family Members In [place], Father. Colon Polyps Brother, Father, Sister. Depression Daughter. Diabetes Mellitus Father. Hypertension Father. Kidney Disease Father. Melanoma Father. Ovarian Cancer Family Members In General. Prostate Cancer Father.  Pregnancy / Birth History Sydney Dickson, [place]; 06/14/2014 9:45 AM) Age at menarche 56 years. Age of menopause 84-50 Contraceptive History Oral contraceptives. Gravida 3 Maternal age 26-25 Para 2     Review of Systems ([place]; 06/14/2014 9:45 AM) General Present- Fatigue. Not Present- Appetite Loss, Chills, Fever, Night Sweats, Weight Gain and Weight Loss. Skin Not Present- Change in Wart/Mole, Dryness, Hives, Jaundice, New Lesions, Non-Healing Wounds, Rash and Ulcer. HEENT Present- Ringing in the Ears, Seasonal Allergies and Wears glasses/contact lenses. Not Present- Earache,  Hearing Loss, Hoarseness, Nose Bleed, Oral Ulcers, Sinus Pain, Sore Throat, Visual Disturbances and Yellow Eyes. Respiratory Present- Snoring. Not Present- Bloody sputum, Chronic Cough, Difficulty Breathing and Wheezing. Breast Not Present- Breast Mass, Breast Pain, Nipple Discharge and Skin Changes. Cardiovascular Not Present- Chest Pain, Difficulty Breathing Lying Down, Leg Cramps, Palpitations, Rapid Heart Rate, Shortness of Breath and Swelling of Extremities. Gastrointestinal Present- Excessive gas, Hemorrhoids and  Rectal Pain. Not Present- Abdominal Pain, Bloating, Bloody Stool, Change in Bowel Habits, Chronic diarrhea, Constipation, Difficulty Swallowing, Gets full quickly at meals, Indigestion, Nausea and Vomiting. Female Genitourinary Not Present- Frequency, Nocturia, Painful Urination, Pelvic Pain and Urgency. Musculoskeletal Present- Joint Pain and Joint Stiffness. Not Present- Back Pain, Muscle Pain, Muscle Weakness and Swelling of Extremities. Neurological Not Present- Decreased Memory, Fainting, Headaches, Numbness, Seizures, Tingling, Tremor, Trouble walking and Weakness. Psychiatric Present- Anxiety and Depression. Not Present- Bipolar, Change in Sleep Pattern, Fearful and Frequent crying. Endocrine Present- Heat Intolerance and New Diabetes. Not Present- Cold Intolerance, Excessive Hunger, Hair Changes and Hot flashes. Hematology Present- Easy Bruising. Not Present- Excessive bleeding, Gland problems, HIV and Persistent Infections.  Vitals (Sydney Dickson CMA; 06/14/2014 9:46 AM) 06/14/2014 9:46 AM Weight: 165 lb Height: 64in Body Surface Area: 1.84 m Body Mass Index: 28.32 kg/m Temp.: 97.15F(Temporal)  Pulse: 66 (Regular)  BP: 124/72 (Sitting, Left Arm, Standard)     Physical Exam [name] MD; 06/14/2014 10:33 AM)  General Mental Status-Alert. General Appearance-Not in acute distress, Not Sickly. Orientation-Oriented X3. Hydration-Well  hydrated. Voice-Normal.  Integumentary Global Assessment Upon inspection and palpation of skin surfaces of the - Axillae: non-tender, no inflammation or ulceration, no drainage. and Distribution of scalp and body hair is normal. General Characteristics Temperature - normal warmth is noted.  Head and Neck Head-normocephalic, atraumatic with no lesions or palpable masses. Face Global Assessment - atraumatic, no absence of expression. Neck Global Assessment - no abnormal movements, no bruit auscultated on the right, no bruit auscultated on the left, no decreased range of motion, non-tender. Trachea-midline. Thyroid Gland Characteristics - non-tender.  Eye Eyeball - Left-Extraocular movements intact, No Nystagmus. Eyeball - Right-Extraocular movements intact, No Nystagmus. Cornea - Left-No Hazy. Cornea - Right-No Hazy. Sclera/Conjunctiva - Left-No scleral icterus, No Discharge. Sclera/Conjunctiva - Right-No scleral icterus, No Discharge. Pupil - Left-Direct reaction to light normal. Pupil - Right-Direct reaction to light normal.  ENMT Ears Pinna - Left - no drainage observed, no generalized tenderness observed. Right - no drainage observed, no generalized tenderness observed. Nose and Sinuses External Inspection of the Nose - no destructive lesion observed. Inspection of the nares - Left - quiet respiration. Right - quiet respiration. Mouth and Throat Lips - Upper Lip - no fissures observed, no pallor noted. Lower Lip - no fissures observed, no pallor noted. Nasopharynx - no discharge present. Oral Cavity/Oropharynx - Tongue - no dryness observed. Oral Mucosa - no cyanosis observed. Hypopharynx - no evidence of airway distress observed.  Chest and Lung Exam Inspection Movements - Normal and Symmetrical. Accessory muscles - No use of accessory muscles in breathing. Palpation Palpation of the chest reveals - Non-tender. Auscultation Breath sounds - Normal  and Clear.  Cardiovascular Auscultation Rhythm - Regular. Murmurs & Other Heart Sounds - Auscultation of the heart reveals - No Murmurs and No Systolic Clicks.  Abdomen Inspection Inspection of the abdomen reveals - No Visible peristalsis and No Abnormal pulsations. Umbilicus - No Bleeding, No Urine drainage. Palpation/Percussion Palpation and Percussion of the abdomen reveal - Soft, Non Tender, No Rebound tenderness, No Rigidity (guarding) and No Cutaneous hyperesthesia. Note: Abdomen soft and flat no diastases. No umbilical hernia. No abdominal pain   Female Genitourinary Sexual Maturity Tanner 5 - Adult hair pattern. Note: No vaginal bleeding nor discharge. No inguinal hernias   Rectal Note: Perianal skin clean. No fissure. No fistula. No pilonidal disease. No pruritus. No external hemorrhoids. Normal sphincter tone. 2 cm from the anal verge  in the LEFT anterior aspect is a pedunculated polyp. The stalk about 1 cm. Mass about 2 cm. Friable. Mobile. No other rectal masses felt. No anoscopy done.   Peripheral Vascular Upper Extremity Inspection - Left - No Cyanotic nailbeds, Not Ischemic. Right - No Cyanotic nailbeds, Not Ischemic.  Neurologic Neurologic evaluation reveals -normal attention span and ability to concentrate, able to name objects and repeat phrases. Appropriate fund of knowledge , normal sensation and normal coordination. Mental Status Affect - not angry, not paranoid. Cranial Nerves-Normal Bilaterally. Gait-Normal.  Neuropsychiatric Mental status exam performed with findings of-able to articulate well with normal speech/language, rate, volume and coherence, thought content normal with ability to perform basic computations and apply abstract reasoning and no evidence of hallucinations, delusions, obsessions or homicidal/suicidal ideation.  Musculoskeletal Global Assessment Spine, Ribs and Pelvis - no instability, subluxation or laxity. Right Upper  Extremity - no instability, subluxation or laxity.  Lymphatic Head & Neck  General Head & Neck Lymphatics: Bilateral - Description - No Localized lymphadenopathy. Axillary  General Axillary Region: Bilateral - Description - No Localized lymphadenopathy. Femoral & Inguinal  Generalized Femoral & Inguinal Lymphatics: Left - Description - No Localized lymphadenopathy. Right - Description - No Localized lymphadenopathy.    Assessment & Plan [name] MD; 06/14/2014 10:43 AM)  ADENOMATOUS RECTAL POLYP (211.4  D12.8)  Impression: Distal adenomatous rectal polyp. Most likely would benefit from transanal resection. Reasonable start out with combined TEM/transanal approach. That allows full thickness resection with controlled margins. Because of her strong family history of colorectal cancer, she is very motivated to "get this thing done"  Polyp seems to be LEFT anterior at 2 o'clock position.  Therefore plan prone positioning.  Another option is decubitus.  Current Plans Schedule for Surgery Written instructions provided Discussed regular exercise with patient. The anatomy & physiology of the digestive tract was discussed. The pathophysiology of the rectal pathology was discussed. Natural history risks without surgery was discussed. I feel the risks of no intervention will lead to serious problems that outweigh the operative risks; therefore, I recommended surgery.  Laparoscopic & open abdominal techniques were discussed. I recommended we start with a partial proctectomy by transanal endoscopic microsurgery (TEM) for excisional biopsy to remove the pathology and hopefully cure and/or control the pathology. This technique can offer less operative risk and faster post-operative recovery. Possible need for immediate or later abdominal surgery for further treatment was discussed.  Risks such as bleeding, abscess, reoperation, ostomy, heart attack, death, and other risks were discussed. I  noted a good likelihood this will help address the problem. Goals of post-operative recovery were discussed as well. We will work to minimize complications. An educational handout was given as well. Questions were answered. The patient expresses understanding & wishes to proceed with surgery. Pt Education - [facility] TEM Education (Leather Estis): discussed with patient and provided information. Pt Education - [facility] Rectal Surgery HCI (Lucy Boardman): discussed with patient and provided information. Pt Education - [facility] Good Bowel Health (Jaceion Aday) Pt Education - [facility] Pain Control (Davis Vannatter)  [name], M.D., F.A.C.S. Gastrointestinal and Minimally Invasive Surgery [facility] [address] [place], [place] [address] [phone] Main / Paging

## 2014-06-30 ENCOUNTER — Other Ambulatory Visit: Payer: Self-pay | Admitting: Physician Assistant

## 2014-06-30 DIAGNOSIS — F32A Depression, unspecified: Secondary | ICD-10-CM

## 2014-06-30 DIAGNOSIS — F329 Major depressive disorder, single episode, unspecified: Secondary | ICD-10-CM

## 2014-07-02 ENCOUNTER — Other Ambulatory Visit: Payer: Self-pay | Admitting: Physician Assistant

## 2014-07-11 NOTE — Progress Notes (Signed)
Abbreviations noted in consent order. Please clarify-no abbreviations per consent. Thanks.

## 2014-07-12 ENCOUNTER — Other Ambulatory Visit: Payer: Self-pay | Admitting: Surgery

## 2014-07-12 ENCOUNTER — Encounter (HOSPITAL_COMMUNITY)
Admission: RE | Admit: 2014-07-12 | Discharge: 2014-07-12 | Disposition: A | Discharge status: Home / Self Care | Payer: 59 | Source: Ambulatory Visit | Attending: Surgery | Admitting: Surgery

## 2014-07-12 ENCOUNTER — Encounter (HOSPITAL_COMMUNITY): Payer: Self-pay

## 2014-07-12 DIAGNOSIS — Z01812 Encounter for preprocedural laboratory examination: Secondary | ICD-10-CM | POA: Insufficient documentation

## 2014-07-12 HISTORY — DX: Gastro-esophageal reflux disease without esophagitis: K21.9

## 2014-07-12 HISTORY — DX: Anxiety disorder, unspecified: F41.9

## 2014-07-12 HISTORY — DX: Unspecified asthma, uncomplicated: J45.909

## 2014-07-12 HISTORY — DX: Personal history of other diseases of the digestive system: Z87.19

## 2014-07-12 LAB — CBC
HCT: 44.8 % (ref 36.0–46.0)
Hemoglobin: 14.2 g/dL (ref 12.0–15.0)
MCH: 27.4 pg (ref 26.0–34.0)
MCHC: 31.7 g/dL (ref 30.0–36.0)
MCV: 86.5 fL (ref 78.0–100.0)
PLATELETS: 308 10*3/uL (ref 150–400)
RBC: 5.18 MIL/uL — ABNORMAL HIGH (ref 3.87–5.11)
RDW: 14.1 % (ref 11.5–15.5)
WBC: 9.9 10*3/uL (ref 4.0–10.5)

## 2014-07-12 LAB — BASIC METABOLIC PANEL
ANION GAP: 7 (ref 5–15)
BUN: 15 mg/dL (ref 6–23)
CALCIUM: 9.4 mg/dL (ref 8.4–10.5)
CO2: 26 mmol/L (ref 19–32)
CREATININE: 0.7 mg/dL (ref 0.50–1.10)
Chloride: 107 mmol/L (ref 96–112)
GFR calc non Af Amer: >90 mL/min (ref >90)
Glucose, Bld: 116 mg/dL — ABNORMAL HIGH (ref 70–99)
Potassium: 3.7 mmol/L (ref 3.5–5.1)
SODIUM: 140 mmol/L (ref 135–145)

## 2014-07-12 NOTE — Consult Note (Addendum)
WOC consult requested for stoma site marking for possible ileostomy or colostomy.  Pt plans to have surgery performed on 4/14 for a polyp removal. Assessed patient while standing and sitting.  Marks placed within rectus muscles, in line of vision, to area free from folds.  Stoma site located 8 cm to the left and right of umbilicus and 4 cm below.  Marker given to patient with instructions to re-color in the area if it begins to fade.  Demonstrated ostomy pouch appearance and briefly discussed pouching routines.  Educational material offered to patient but she declined. Pt is aware that ostomy surgery may not be necessary and verbalized understanding.  If she receives an ostomy, then [place] will follow-post-op for educational sessions. Please re-consult if further assistance is needed.  Thank-you,  Julien Girt MSN, [place], [place], [place], [place]

## 2014-07-12 NOTE — Progress Notes (Signed)
CBC results in epic per PAT visit 07/12/2014 sent to Dr Johney Maine

## 2014-07-12 NOTE — Progress Notes (Addendum)
EKG per epic 12/28/2013  Stress Test per epic 05/04/2005

## 2014-07-12 NOTE — Patient Instructions (Signed)
20 SARENA JEZEK  07/12/2014   Your procedure is scheduled on:     Thursday July 19, 2014   Report to [facility] Main Entrance and follow signs to  [place] arrive at 10:00 AM.   Call this number if you have problems the morning of surgery [phone] or Presurgical Testing [phone].   Remember:  Do not eat food or drink liquids :After Midnight.  For Living Will and/or Health Care Power Attorney Forms: please provide copy for your medical record, may bring AM of surgery (forms should be already notarized-we do not provide this service).  Remember: follow any bowel prep instructions per MD office.   Take these medicines the morning of surgery with A SIP OF WATER: ALprazolam (Xanax) if needed; Escitalopram (Lexapro); Nasonex if needed; Levocetirizine (Xyzal) if needed                               You may not have any metal on your body including hair pins and piercings  Do not wear jewelry, make-up, lotions, powders, prefumes,deodorant or nail polish.  Do not shave body hair  48 hours(2 days) of CHG soap use.                Do not bring valuables to the hospital. [place].  Contacts, dentures or bridgework may not be worn into surgery.  Leave suitcase in the car. After surgery it may be brought to your room.  For patients admitted to the hospital, checkout time is 11:00 AM the day of discharge.     Special Instructions: review fact sheets for Blood Transfusion fact sheet ________________________________________________________________________  [facility] - Preparing for Surgery Before surgery, you can play an important role.  Because skin is not sterile, your skin needs to be as free of germs as possible.  You can reduce the number of germs on your skin by washing with CHG (chlorahexidine gluconate) soap before surgery.  CHG is an antiseptic cleaner which kills germs and bonds with the skin to continue killing germs even after  washing. Please DO NOT use if you have an allergy to CHG or antibacterial soaps.  If your skin becomes reddened/irritated stop using the CHG and inform your nurse when you arrive at Short Stay. Do not shave (including legs and underarms) for at least 48 hours prior to the first CHG shower.  You may shave your face/neck. Please follow these instructions carefully:  1.  Shower with CHG Soap the night before surgery and the  morning of Surgery.  2.  If you choose to wash your hair, wash your hair first as usual with your  normal  shampoo.  3.  After you shampoo, rinse your hair and body thoroughly to remove the  shampoo.                           4.  Use CHG as you would any other liquid soap.  You can apply chg directly  to the skin and wash                       Gently with a scrungie or clean washcloth.  5.  Apply the CHG Soap to your body ONLY FROM THE NECK DOWN.   Do not use on face/ open  Wound or open sores. Avoid contact with eyes, ears mouth and genitals (private parts).                       Wash face,  Genitals (private parts) with your normal soap.             6.  Wash thoroughly, paying special attention to the area where your surgery  will be performed.  7.  Thoroughly rinse your body with warm water from the neck down.  8.  DO NOT shower/wash with your normal soap after using and rinsing off  the CHG Soap.                9.  Pat yourself dry with a clean towel.            10.  Wear clean pajamas.            11.  Place clean sheets on your bed the night of your first shower and do not  sleep with pets. Day of Surgery : Do not apply any lotions/deodorants the morning of surgery.  Please wear clean clothes to the hospital/surgery center.  FAILURE TO FOLLOW THESE INSTRUCTIONS MAY RESULT IN THE CANCELLATION OF YOUR SURGERY PATIENT SIGNATURE_________________________________  NURSE  SIGNATURE__________________________________  ________________________________________________________________________

## 2014-07-13 LAB — HEMOGLOBIN A1C
Hgb A1c MFr Bld: 6.8 % — ABNORMAL HIGH (ref 4.8–5.6)
MEAN PLASMA GLUCOSE: 148 mg/dL

## 2014-07-13 NOTE — Progress Notes (Signed)
HGA1C results in epic per PAT visit 07/12/2014 sent to Dr Johney Maine

## 2014-07-18 MED ORDER — CLINDAMYCIN PHOSPHATE 900 MG/50ML IV SOLN
900.0000 mg | INTRAVENOUS | Status: AC
  Administered 2014-07-19: 900 mg via INTRAVENOUS

## 2014-07-18 MED ORDER — GENTAMICIN SULFATE 40 MG/ML IJ SOLN
320.0000 mg | INTRAVENOUS | Status: AC
  Administered 2014-07-19: 320 mg via INTRAVENOUS
  Filled 2014-07-18: qty 8

## 2014-07-19 ENCOUNTER — Encounter (HOSPITAL_COMMUNITY): Admission: RE | Disposition: A | Discharge status: Home / Self Care | Payer: Self-pay | Source: Ambulatory Visit

## 2014-07-19 ENCOUNTER — Inpatient Hospital Stay (HOSPITAL_COMMUNITY): Payer: 59 | Admitting: Registered Nurse

## 2014-07-19 ENCOUNTER — Encounter (HOSPITAL_COMMUNITY): Payer: Self-pay | Admitting: *Deleted

## 2014-07-19 ENCOUNTER — Other Ambulatory Visit: Payer: Self-pay | Admitting: Internal Medicine

## 2014-07-19 ENCOUNTER — Observation Stay (HOSPITAL_COMMUNITY)
Admission: RE | Admit: 2014-07-19 | Discharge: 2014-07-20 | Disposition: A | Discharge status: Home / Self Care | Payer: 59 | Source: Ambulatory Visit | Attending: Surgery | Admitting: Surgery

## 2014-07-19 DIAGNOSIS — I10 Essential (primary) hypertension: Secondary | ICD-10-CM | POA: Insufficient documentation

## 2014-07-19 DIAGNOSIS — Z79899 Other long term (current) drug therapy: Secondary | ICD-10-CM | POA: Diagnosis not present

## 2014-07-19 DIAGNOSIS — F988 Other specified behavioral and emotional disorders with onset usually occurring in childhood and adolescence: Secondary | ICD-10-CM | POA: Diagnosis not present

## 2014-07-19 DIAGNOSIS — Z880 Allergy status to penicillin: Secondary | ICD-10-CM | POA: Diagnosis not present

## 2014-07-19 DIAGNOSIS — F419 Anxiety disorder, unspecified: Secondary | ICD-10-CM | POA: Insufficient documentation

## 2014-07-19 DIAGNOSIS — Z888 Allergy status to other drugs, medicaments and biological substances status: Secondary | ICD-10-CM | POA: Insufficient documentation

## 2014-07-19 DIAGNOSIS — J45909 Unspecified asthma, uncomplicated: Secondary | ICD-10-CM | POA: Diagnosis not present

## 2014-07-19 DIAGNOSIS — D128 Benign neoplasm of rectum: Secondary | ICD-10-CM

## 2014-07-19 DIAGNOSIS — K621 Rectal polyp: Secondary | ICD-10-CM | POA: Diagnosis not present

## 2014-07-19 DIAGNOSIS — K37 Unspecified appendicitis: Secondary | ICD-10-CM | POA: Diagnosis present

## 2014-07-19 DIAGNOSIS — E119 Type 2 diabetes mellitus without complications: Secondary | ICD-10-CM | POA: Insufficient documentation

## 2014-07-19 DIAGNOSIS — E780 Pure hypercholesterolemia: Secondary | ICD-10-CM | POA: Diagnosis not present

## 2014-07-19 DIAGNOSIS — K219 Gastro-esophageal reflux disease without esophagitis: Secondary | ICD-10-CM | POA: Insufficient documentation

## 2014-07-19 DIAGNOSIS — Z885 Allergy status to narcotic agent status: Secondary | ICD-10-CM | POA: Diagnosis not present

## 2014-07-19 DIAGNOSIS — F329 Major depressive disorder, single episode, unspecified: Secondary | ICD-10-CM | POA: Insufficient documentation

## 2014-07-19 HISTORY — PX: PARTIAL PROCTECTOMY BY TEM: SHX6011

## 2014-07-19 LAB — GLUCOSE, CAPILLARY
Glucose-Capillary: 117 mg/dL — ABNORMAL HIGH (ref 70–99)
Glucose-Capillary: 110 mg/dL — ABNORMAL HIGH (ref 70–99)
Glucose-Capillary: 204 mg/dL — ABNORMAL HIGH (ref 70–99)

## 2014-07-19 LAB — TYPE AND SCREEN
ABO/RH(D): O POS
Antibody Screen: NEGATIVE

## 2014-07-19 LAB — ABO/RH: ABO/RH(D): O POS

## 2014-07-19 SURGERY — PARTIAL PROCTECTOMY BY TEM
Anesthesia: General | Site: Rectum

## 2014-07-19 MED ORDER — HYDROMORPHONE HCL 1 MG/ML IJ SOLN: 0.5000 mg | INTRAMUSCULAR | Status: DC | PRN

## 2014-07-19 MED ORDER — HEPARIN SODIUM (PORCINE) 5000 UNIT/ML IJ SOLN
5000.0000 [IU] | Freq: Once | INTRAMUSCULAR | Status: AC
  Administered 2014-07-19: 5000 [IU] via SUBCUTANEOUS
  Filled 2014-07-19: qty 1

## 2014-07-19 MED ORDER — ROCURONIUM BROMIDE 100 MG/10ML IV SOLN
INTRAVENOUS | Status: DC | PRN
  Administered 2014-07-19: 10 mg via INTRAVENOUS
  Administered 2014-07-19: 5 mg via INTRAVENOUS
  Administered 2014-07-19: 25 mg via INTRAVENOUS

## 2014-07-19 MED ORDER — IBUPROFEN 800 MG PO TABS: 800.0000 mg | ORAL_TABLET | Freq: Three times a day (TID) | ORAL | Status: DC | PRN

## 2014-07-19 MED ORDER — BUPIVACAINE-EPINEPHRINE (PF) 0.25% -1:200000 IJ SOLN
INTRAMUSCULAR | Status: AC
  Filled 2014-07-19: qty 30

## 2014-07-19 MED ORDER — MAGIC MOUTHWASH
15.0000 mL | Freq: Four times a day (QID) | ORAL | Status: DC | PRN
  Filled 2014-07-19: qty 15

## 2014-07-19 MED ORDER — LACTATED RINGERS IV SOLN
INTRAVENOUS | Status: DC | PRN
  Administered 2014-07-19 (×2): via INTRAVENOUS

## 2014-07-19 MED ORDER — FLUTICASONE PROPIONATE 50 MCG/ACT NA SUSP: 1.0000 | Freq: Every day | NASAL | Status: DC

## 2014-07-19 MED ORDER — ALUM & MAG HYDROXIDE-SIMETH 200-200-20 MG/5ML PO SUSP: 30.0000 mL | Freq: Four times a day (QID) | ORAL | Status: DC | PRN

## 2014-07-19 MED ORDER — OXYCODONE HCL 5 MG/5ML PO SOLN
5.0000 mg | Freq: Once | ORAL | Status: DC | PRN
  Filled 2014-07-19: qty 5

## 2014-07-19 MED ORDER — KETOROLAC TROMETHAMINE 30 MG/ML IJ SOLN
INTRAMUSCULAR | Status: AC
  Filled 2014-07-19: qty 1

## 2014-07-19 MED ORDER — GLYCOPYRROLATE 0.2 MG/ML IJ SOLN
INTRAMUSCULAR | Status: DC | PRN
  Administered 2014-07-19: 0.6 mg via INTRAVENOUS

## 2014-07-19 MED ORDER — SODIUM CHLORIDE 0.9 % IJ SOLN: 3.0000 mL | Freq: Two times a day (BID) | INTRAMUSCULAR | Status: DC

## 2014-07-19 MED ORDER — PROMETHAZINE HCL 25 MG/ML IJ SOLN: 6.2500 mg | INTRAMUSCULAR | Status: DC | PRN

## 2014-07-19 MED ORDER — MIDAZOLAM HCL 2 MG/2ML IJ SOLN
INTRAMUSCULAR | Status: AC
  Filled 2014-07-19: qty 2

## 2014-07-19 MED ORDER — SODIUM CHLORIDE 0.9 % IJ SOLN: 3.0000 mL | INTRAMUSCULAR | Status: DC | PRN

## 2014-07-19 MED ORDER — ONDANSETRON HCL 4 MG PO TABS: 4.0000 mg | ORAL_TABLET | Freq: Four times a day (QID) | ORAL | Status: DC | PRN

## 2014-07-19 MED ORDER — GLYCOPYRROLATE 0.2 MG/ML IJ SOLN
INTRAMUSCULAR | Status: AC
  Filled 2014-07-19: qty 2

## 2014-07-19 MED ORDER — HYDROCORTISONE ACE-PRAMOXINE 2.5-1 % RE CREA
1.0000 "application " | TOPICAL_CREAM | Freq: Four times a day (QID) | RECTAL | Status: DC | PRN
  Filled 2014-07-19: qty 30

## 2014-07-19 MED ORDER — HEPARIN SODIUM (PORCINE) 5000 UNIT/ML IJ SOLN
5000.0000 [IU] | Freq: Three times a day (TID) | INTRAMUSCULAR | Status: DC
  Administered 2014-07-20: 5000 [IU] via SUBCUTANEOUS
  Filled 2014-07-19 (×4): qty 1

## 2014-07-19 MED ORDER — NEOSTIGMINE METHYLSULFATE 10 MG/10ML IV SOLN
INTRAVENOUS | Status: AC
  Filled 2014-07-19: qty 1

## 2014-07-19 MED ORDER — ACETAMINOPHEN 500 MG PO TABS
1000.0000 mg | ORAL_TABLET | Freq: Three times a day (TID) | ORAL | Status: DC
  Administered 2014-07-19: 1000 mg via ORAL
  Filled 2014-07-19 (×4): qty 2

## 2014-07-19 MED ORDER — LACTATED RINGERS IR SOLN
Status: DC | PRN
  Administered 2014-07-19: 1000 mL

## 2014-07-19 MED ORDER — BUPIVACAINE-EPINEPHRINE (PF) 0.25% -1:200000 IJ SOLN
INTRAMUSCULAR | Status: DC | PRN
  Administered 2014-07-19: 30 mL

## 2014-07-19 MED ORDER — KETOROLAC TROMETHAMINE 30 MG/ML IJ SOLN
INTRAMUSCULAR | Status: DC | PRN
  Administered 2014-07-19: 30 mg via INTRAVENOUS

## 2014-07-19 MED ORDER — BISOPROLOL-HYDROCHLOROTHIAZIDE 10-6.25 MG PO TABS
1.0000 | ORAL_TABLET | Freq: Every day | ORAL | Status: DC
  Filled 2014-07-19: qty 1

## 2014-07-19 MED ORDER — PHENYLEPHRINE 40 MCG/ML (10ML) SYRINGE FOR IV PUSH (FOR BLOOD PRESSURE SUPPORT)
PREFILLED_SYRINGE | INTRAVENOUS | Status: AC
  Filled 2014-07-19: qty 10

## 2014-07-19 MED ORDER — MIDAZOLAM HCL 5 MG/5ML IJ SOLN
INTRAMUSCULAR | Status: DC | PRN
  Administered 2014-07-19: 2 mg via INTRAVENOUS

## 2014-07-19 MED ORDER — SODIUM CHLORIDE 0.9 % IV SOLN: 250.0000 mL | INTRAVENOUS | Status: DC | PRN

## 2014-07-19 MED ORDER — LIDOCAINE HCL (CARDIAC) 20 MG/ML IV SOLN: INTRAVENOUS | Status: DC | PRN

## 2014-07-19 MED ORDER — PROPOFOL 10 MG/ML IV BOLUS
INTRAVENOUS | Status: DC | PRN
  Administered 2014-07-19: 180 mg via INTRAVENOUS

## 2014-07-19 MED ORDER — LIP MEDEX EX OINT
1.0000 "application " | TOPICAL_OINTMENT | Freq: Two times a day (BID) | CUTANEOUS | Status: DC
  Administered 2014-07-19: 1 via TOPICAL
  Filled 2014-07-19: qty 7

## 2014-07-19 MED ORDER — ONDANSETRON HCL 4 MG/2ML IJ SOLN: 4.0000 mg | Freq: Four times a day (QID) | INTRAMUSCULAR | Status: DC | PRN

## 2014-07-19 MED ORDER — OXYCODONE HCL 5 MG PO TABS
5.0000 mg | ORAL_TABLET | ORAL | Status: DC | PRN
  Administered 2014-07-19: 5 mg via ORAL
  Filled 2014-07-19: qty 1

## 2014-07-19 MED ORDER — DIPHENHYDRAMINE HCL 50 MG/ML IJ SOLN: 12.5000 mg | Freq: Four times a day (QID) | INTRAMUSCULAR | Status: DC | PRN

## 2014-07-19 MED ORDER — PHENYLEPHRINE HCL 10 MG/ML IJ SOLN
INTRAMUSCULAR | Status: DC | PRN
  Administered 2014-07-19 (×2): 80 ug via INTRAVENOUS

## 2014-07-19 MED ORDER — ONDANSETRON HCL 4 MG/2ML IJ SOLN
INTRAMUSCULAR | Status: AC
  Filled 2014-07-19: qty 2

## 2014-07-19 MED ORDER — 0.9 % SODIUM CHLORIDE (POUR BTL) OPTIME
TOPICAL | Status: DC | PRN
  Administered 2014-07-19: 1000 mL

## 2014-07-19 MED ORDER — ROCURONIUM BROMIDE 100 MG/10ML IV SOLN
INTRAVENOUS | Status: AC
  Filled 2014-07-19: qty 1

## 2014-07-19 MED ORDER — FENTANYL CITRATE 0.05 MG/ML IJ SOLN
INTRAMUSCULAR | Status: AC
  Filled 2014-07-19: qty 5

## 2014-07-19 MED ORDER — BISOPROLOL FUMARATE 10 MG PO TABS
10.0000 mg | ORAL_TABLET | Freq: Once | ORAL | Status: DC
  Filled 2014-07-19: qty 1

## 2014-07-19 MED ORDER — CHLORHEXIDINE GLUCONATE 4 % EX LIQD: 60.0000 mL | Freq: Once | CUTANEOUS | Status: DC

## 2014-07-19 MED ORDER — FENTANYL CITRATE (PF) 250 MCG/5ML IJ SOLN
INTRAMUSCULAR | Status: DC | PRN
  Administered 2014-07-19: 100 ug via INTRAVENOUS
  Administered 2014-07-19 (×3): 50 ug via INTRAVENOUS

## 2014-07-19 MED ORDER — LACTATED RINGERS IV BOLUS (SEPSIS): 1000.0000 mL | Freq: Three times a day (TID) | INTRAVENOUS | Status: DC | PRN

## 2014-07-19 MED ORDER — BUPIVACAINE LIPOSOME 1.3 % IJ SUSP
20.0000 mL | INTRAMUSCULAR | Status: DC
  Filled 2014-07-19: qty 20

## 2014-07-19 MED ORDER — HYDROMORPHONE HCL 1 MG/ML IJ SOLN: 0.2500 mg | INTRAMUSCULAR | Status: DC | PRN

## 2014-07-19 MED ORDER — WITCH HAZEL-GLYCERIN EX PADS
1.0000 "application " | MEDICATED_PAD | CUTANEOUS | Status: DC | PRN
  Filled 2014-07-19: qty 100

## 2014-07-19 MED ORDER — ALVIMOPAN 12 MG PO CAPS
12.0000 mg | ORAL_CAPSULE | Freq: Once | ORAL | Status: AC
  Administered 2014-07-19: 12 mg via ORAL
  Filled 2014-07-19: qty 1

## 2014-07-19 MED ORDER — OXYCODONE HCL 5 MG PO TABS: 5.0000 mg | ORAL_TABLET | ORAL | Status: DC | PRN

## 2014-07-19 MED ORDER — ALPRAZOLAM 0.5 MG PO TABS: 0.5000 mg | ORAL_TABLET | Freq: Two times a day (BID) | ORAL | Status: DC | PRN

## 2014-07-19 MED ORDER — ESCITALOPRAM OXALATE 20 MG PO TABS
20.0000 mg | ORAL_TABLET | Freq: Every day | ORAL | Status: DC
  Filled 2014-07-19: qty 1

## 2014-07-19 MED ORDER — NEOSTIGMINE METHYLSULFATE 10 MG/10ML IV SOLN
INTRAVENOUS | Status: DC | PRN
  Administered 2014-07-19: 4 mg via INTRAVENOUS

## 2014-07-19 MED ORDER — METOPROLOL TARTRATE 12.5 MG HALF TABLET
12.5000 mg | ORAL_TABLET | Freq: Two times a day (BID) | ORAL | Status: DC | PRN
  Filled 2014-07-19: qty 1

## 2014-07-19 MED ORDER — ROSUVASTATIN CALCIUM 20 MG PO TABS
20.0000 mg | ORAL_TABLET | ORAL | Status: DC
  Filled 2014-07-19: qty 1

## 2014-07-19 MED ORDER — LISDEXAMFETAMINE DIMESYLATE 20 MG PO CAPS: 40.0000 mg | ORAL_CAPSULE | Freq: Every day | ORAL | Status: DC

## 2014-07-19 MED ORDER — INSULIN ASPART 100 UNIT/ML ~~LOC~~ SOLN: 0.0000 [IU] | Freq: Three times a day (TID) | SUBCUTANEOUS | Status: DC

## 2014-07-19 MED ORDER — LACTATED RINGERS IV SOLN: INTRAVENOUS | Status: DC

## 2014-07-19 MED ORDER — INSULIN ASPART 100 UNIT/ML ~~LOC~~ SOLN
0.0000 [IU] | Freq: Every day | SUBCUTANEOUS | Status: DC
  Administered 2014-07-19: 2 [IU] via SUBCUTANEOUS

## 2014-07-19 MED ORDER — MENTHOL 3 MG MT LOZG: 1.0000 | LOZENGE | OROMUCOSAL | Status: DC | PRN

## 2014-07-19 MED ORDER — OXYCODONE HCL 5 MG PO TABS: 5.0000 mg | ORAL_TABLET | Freq: Once | ORAL | Status: DC | PRN

## 2014-07-19 MED ORDER — METOPROLOL TARTRATE 1 MG/ML IV SOLN
5.0000 mg | Freq: Four times a day (QID) | INTRAVENOUS | Status: DC | PRN
  Filled 2014-07-19: qty 5

## 2014-07-19 MED ORDER — DAPAGLIFLOZIN PROPANEDIOL 10 MG PO TABS: 10.0000 mg | ORAL_TABLET | Freq: Every day | ORAL | Status: DC

## 2014-07-19 MED ORDER — CLINDAMYCIN PHOSPHATE 900 MG/50ML IV SOLN
INTRAVENOUS | Status: AC
  Filled 2014-07-19: qty 50

## 2014-07-19 MED ORDER — SUCCINYLCHOLINE CHLORIDE 20 MG/ML IJ SOLN
INTRAMUSCULAR | Status: DC | PRN
  Administered 2014-07-19: 100 mg via INTRAVENOUS

## 2014-07-19 MED ORDER — PHENOL 1.4 % MT LIQD: 2.0000 | OROMUCOSAL | Status: DC | PRN

## 2014-07-19 MED ORDER — DEXTROSE IN LACTATED RINGERS 5 % IV SOLN
INTRAVENOUS | Status: DC
  Administered 2014-07-19: 18:00:00 via INTRAVENOUS

## 2014-07-19 SURGICAL SUPPLY — 53 items
BLADE SURG 15 STRL LF DISP TIS (BLADE) ×1 IMPLANT
BLADE SURG 15 STRL SS (BLADE) ×2
BRIEF STRETCH FOR OB PAD LRG (UNDERPADS AND DIAPERS) IMPLANT
CABLE HIGH FREQUENCY MONO STRZ (ELECTRODE) ×3 IMPLANT
DRAPE C-ARM 42X120 X-RAY (DRAPES) IMPLANT
DRAPE CAMERA CLOSED 9X96 (DRAPES) ×3 IMPLANT
DRAPE LAPAROTOMY T 102X78X121 (DRAPES) IMPLANT
DRAPE WARM FLUID 44X44 (DRAPE) ×3 IMPLANT
DRSG PAD ABDOMINAL 8X10 ST (GAUZE/BANDAGES/DRESSINGS) IMPLANT
ELECT REM PT RETURN 9FT ADLT (ELECTROSURGICAL) ×3
ELECTRODE REM PT RTRN 9FT ADLT (ELECTROSURGICAL) ×1 IMPLANT
GAUZE SPONGE 4X4 12PLY STRL (GAUZE/BANDAGES/DRESSINGS) ×3 IMPLANT
GAUZE SPONGE 4X4 16PLY XRAY LF (GAUZE/BANDAGES/DRESSINGS) ×3 IMPLANT
GLOVE ECLIPSE 8.0 STRL XLNG CF (GLOVE) ×6 IMPLANT
GLOVE INDICATOR 8.0 STRL GRN (GLOVE) ×6 IMPLANT
GOWN STRL REUS W/TWL XL LVL3 (GOWN DISPOSABLE) ×6 IMPLANT
IV LACTATED RINGERS 1000ML (IV SOLUTION) IMPLANT
KIT BASIN OR (CUSTOM PROCEDURE TRAY) ×3 IMPLANT
LEGGING LITHOTOMY PAIR STRL (DRAPES) IMPLANT
LUBRICANT JELLY K Y 4OZ (MISCELLANEOUS) ×3 IMPLANT
NEEDLE HYPO 22GX1.5 SAFETY (NEEDLE) ×3 IMPLANT
NS IRRIG 1000ML POUR BTL (IV SOLUTION) ×3 IMPLANT
PACK BASIC VI WITH GOWN DISP (CUSTOM PROCEDURE TRAY) ×3 IMPLANT
PENCIL BUTTON HOLSTER BLD 10FT (ELECTRODE) ×6 IMPLANT
RETRACTOR STAY HOOK 5MM (MISCELLANEOUS) IMPLANT
RETRACTOR WILSON SYSTEM (INSTRUMENTS) IMPLANT
SCISSORS LAP 5X35 DISP (ENDOMECHANICALS) ×3 IMPLANT
SET IRRIG TUBING LAPAROSCOPIC (IRRIGATION / IRRIGATOR) ×3 IMPLANT
SHEARS HARMONIC ACE PLUS 36CM (ENDOMECHANICALS) ×3 IMPLANT
STOPCOCK 4 WAY LG BORE MALE ST (IV SETS) ×3 IMPLANT
SUT CHROMIC 3 0 SH 27 (SUTURE) ×6 IMPLANT
SUT PDS AB 2-0 CT2 27 (SUTURE) IMPLANT
SUT PDS AB 3-0 SH 27 (SUTURE) ×6 IMPLANT
SUT SILK 2 0 (SUTURE)
SUT SILK 2 0 SH CR/8 (SUTURE) IMPLANT
SUT SILK 2-0 18XBRD TIE 12 (SUTURE) IMPLANT
SUT SILK 3 0 SH 30 (SUTURE) IMPLANT
SUT SILK 3 0 SH CR/8 (SUTURE) IMPLANT
SUT V-LOC BARB 180 2/0GR6 GS22 (SUTURE)
SUT VIC AB 2-0 UR6 27 (SUTURE) ×6 IMPLANT
SUT VIC AB 3-0 SH 27 (SUTURE)
SUT VIC AB 3-0 SH 27XBRD (SUTURE) IMPLANT
SUTURE V-LC BRB 180 2/0GR6GS22 (SUTURE) IMPLANT
SYR 20CC LL (SYRINGE) ×3 IMPLANT
SYR BULB IRRIGATION 50ML (SYRINGE) IMPLANT
TOWEL OR 17X26 10 PK STRL BLUE (TOWEL DISPOSABLE) ×3 IMPLANT
TOWEL OR NON WOVEN STRL DISP B (DISPOSABLE) ×3 IMPLANT
TRAY FOLEY CATH 14FRSI W/METER (CATHETERS) ×3 IMPLANT
TRAY FOLEY W/METER SILVER 14FR (SET/KITS/TRAYS/PACK) ×3 IMPLANT
TUBING CONNECTING 10 (TUBING) IMPLANT
TUBING CONNECTING 10' (TUBING)
TUBING INSUFFLATION 10FT LAP (TUBING) ×3 IMPLANT
YANKAUER SUCT BULB TIP 10FT TU (MISCELLANEOUS) ×6 IMPLANT

## 2014-07-19 NOTE — Op Note (Signed)
07/19/2014  4:30 PM  PATIENT:  Sydney Dickson  59 y.o. female  Patient Care Team: Unk Pinto, MD as PCP - General (Internal Medicine) Richmond Campbell, MD as Consulting Physician (Gastroenterology) Jannette Spanner, MD as Referring Physician (Dermatology) Amy Johnnye Sima, DO (Internal Medicine) Garald Balding, MD as Consulting Physician (Orthopedic Surgery) Rutherford Guys, MD as Consulting Physician (Ophthalmology) Lelon Perla, MD as Consulting Physician (Cardiology) Michael Boston, MD as Consulting Physician (General Surgery) Jerene Bears, MD as Consulting Physician (Gastroenterology)  PRE-OPERATIVE DIAGNOSIS:  Rectal Polyp  POST-OPERATIVE DIAGNOSIS:  Rectal Polyp  PROCEDURE:  Procedure(s): PARTIAL PROCTECTOMY BY TEM  SURGEON:  Surgeon(s): Michael Boston, MD  ASSISTANT: RN   ANESTHESIA:   local and general  EBL:  Total I/O In: 1000 [I.V.:1000] Out: -   Delay start of Pharmacological VTE agent (>24hrs) due to surgical blood loss or risk of bleeding:  no  DRAINS: none   SPECIMEN:  Source of Specimen:  RECTAL POLYP (see below)  DISPOSITION OF SPECIMEN:  PATHOLOGY  COUNTS:  YES  PLAN OF CARE: Admit for overnight observation  PATIENT DISPOSITION:  PACU - hemodynamically stable.  INDICATION: Pleasant woman with distal rectal polyp not amenable to colonoscopic resection.  I offered transanal resection by TEM.  The anatomy & physiology of the digestive tract was discussed.  The pathophysiology of the rectal pathology was discussed.  Natural history risks without surgery was discussed.   I feel the risks of no intervention will lead to serious problems that outweigh the operative risks; therefore, I recommended surgery.    Laparoscopic & open abdominal techniques were discussed.  I recommended we start with a partial proctectomy by transanal endoscopic microsurgery (TEM) for excisional biopsy to remove the pathology and hopefully cure and/or control the pathology.  This  technique can offer less operative risk and faster post-operative recovery.  Possible need for immediate or later abdominal surgery for further treatment was discussed.   Risks such as bleeding, abscess, reoperation, ostomy, heart attack, death, and other risks were discussed.   I noted a good likelihood this will help address the problem.  Goals of post-operative recovery were discussed as well.  We will work to minimize complications.  An educational handout was given as well.  Questions were answered.  The patient expresses understanding & wishes to proceed with surgery.  OR FINDINGS:   The polyp was in distal rectum at junction of anal canal, 1.5-3 cm from anal verge.  Anterior midline, slightly left (1 o'clock by lithotomy).  The mass was 2x1x1 cm in size  Pin placement on pathology specimen: Proximal margin: purple/lavender  Distal margin: blue/teal Right lateral margin: White Left lateral margin: Yellow  the closure rests 0 cm from the anal verge in the anterior location  DESCRIPTION:   Informed consent was confirmed.  Patient received general anesthesia without difficulty.  Foley catheter sterilely placed.  Sequential compression devices active during the entire case.  The patient was placed in the prone position, taking extra care to secure and protect the patient appropriately.  The perineum and perianal regions were prepped and draped in a sterile fashion.  Surgical timeout confirmed our plan.  I did a gentle digital rectal examination with gradual anal dilation to allow placement of the 4 cm TEO Stortz scope.  This was secured to the bed using the TEO clamping system.  We induced carbon dioxide insufflation intraluminally.  I oriented the Stortz scope and the patient such that the specimen rested towards the floor at  the 6:00 position.  I could easily identify the mass.  Distal anterior, slightly left of midline.  I went ahead and used tip point cautery to mark 1 -2 cm margins  circumferentially.  I then did a full thickness transection at the distal margin.  I came around laterally.  Switched over to harmonic dissection.  Lifted the specimen off the pelvic canal for a good deep margin.  I gradually came more proximally.  Transected at the proximal margin.  I ensured hemostasis.  I inspected the main specimen and pinned it on thick cork board.  Pins as noted above.  I walked the specimen down to pathology and showed the [facility] pathology team for proper orientation.    I went back in and scrubbed in.  Hemostasis excellent.  I mobilized the anterior rectum off the rectovaginal septum to help it reached down to the anal squamous junction.  I  reapproximated the 5x5cm wound in a transverse fashion with 2-0 vicryl interrupted horizontal mattress suture x 2 & then running 3-0 chronic suture with a large anoscope in place to avoid stricturing.  This brought things together well.  I did meticulous inspection with fine tip instruments to confirm good watertight closure   Hemostasis excellent.  The lumen was quite patent.  Initially an avoid any local anesthesia as the patient stated she had had a rash after lidocaine at some point in her life.  However she said this after lidocaine had be given to an IV site.  The IV site was fine without any rash.  Given this very painful location, I decided to give local anesthetic as anorectal field block.  I used bupivacaine.  I did not use long-term Experel in case she had a long-term allergic reaction.  Carbon dioxide evacuated & instruments removed.  The patient is being extubated go to recovery room.  I am about to discuss operative findings with the patient's family.  [name], M.D., F.A.C.S. Gastrointestinal and Minimally Invasive Surgery [facility] [address] [place], [place] [address] [phone] Main / Paging

## 2014-07-19 NOTE — Discharge Instructions (Signed)
ANORECTAL SURGERY:  POST OPERATIVE INSTRUCTIONS  1. Take your usually prescribed home medications unless otherwise directed. 2. DIET: Follow a light bland diet the first 24 hours after arrival home, such as soup, liquids, crackers, etc.  Be sure to include lots of fluids daily.  Avoid fast food or heavy meals as your are more likely to get nauseated.  Eat a low fat the next few days after surgery.   3. PAIN CONTROL: a. Pain is best controlled by a usual combination of three different methods TOGETHER: i. Ice/Heat ii. Over the counter pain medication iii. Prescription pain medication b. Most patients will experience some swelling and discomfort in the anus/rectal area. and incisions.  Ice packs or heat (30-60 minutes up to 6 times a day) will help. Use ice for the first few days to help decrease swelling and bruising, then switch to heat such as warm towels, sitz baths, warm baths, etc to help relax tight/sore spots and speed recovery.  Some people prefer to use ice alone, heat alone, alternating between ice & heat.  Experiment to what works for you.  Swelling and bruising can take several weeks to resolve.   c. It is helpful to take an over-the-counter pain medication regularly for the first few weeks.  Choose one of the following that works best for you: i. Naproxen (Aleve, etc)  Two 261m tabs twice a day ii. Ibuprofen (Advil, etc) Three 2015mtabs four times a day (every meal & bedtime) iii. Acetaminophen (Tylenol, etc) 500-65049mour times a day (every meal & bedtime) d. A  prescription for pain medication (such as oxycodone, hydrocodone, etc) should be given to you upon discharge.  Take your pain medication as prescribed.  i. If you are having problems/concerns with the prescription medicine (does not control pain, nausea, vomiting, rash, itching, etc), please call us [phone] see if we need to switch you to a different pain medicine that will work better for you and/or control your  side effect better. ii. If you need a refill on your pain medication, please contact your pharmacy.  They will contact our office to request authorization. Prescriptions will not be filled after 5 pm or on week-ends.  Use a Sitz Bath 4-8 times a day for relief A sitz bath is a warm water bath taken in the sitting position that covers only the hips and buttocks. It may be used for either healing or hygiene purposes. Sitz baths are also used to relieve pain, itching, or muscle spasms. The water may contain medicine. Moist heat will help you heal and relax.  HOME CARE INSTRUCTIONS  Take 3 to 4 sitz baths a day. 1. Fill the bathtub half full with warm water. 2. Sit in the water and open the drain a little. 3. Turn on the warm water to keep the tub half full. Keep the water running constantly. 4. Soak in the water for 15 to 20 minutes. 5. After the sitz bath, pat the affected area dry first. SEEK MEDICAL CARE IF:  You get worse instead of better. Stop the sitz baths if you get worse.   4. KEEP YOUR BOWELS REGULAR a. The goal is one bowel movement a day b. Avoid getting constipated.  Between the surgery and the pain medications, it is common to experience some constipation.  Increasing fluid intake and taking a fiber supplement (such as Metamucil, Citrucel, FiberCon, MiraLax, etc) 1-2 times a day regularly will usually help prevent this problem from occurring.  A  mild laxative (prune juice, Milk of Magnesia, MiraLax, etc) should be taken according to package directions if there are no bowel movements after 48 hours. c. Watch out for diarrhea.  If you have many loose bowel movements, simplify your diet to bland foods & liquids for a few days.  Stop any stool softeners and decrease your fiber supplement.  Switching to mild anti-diarrheal medications (Kayopectate, Pepto Bismol) can help.  If this worsens or does not improve, please call us.  5. Wound Care a. Remove your bandages the day after surgery.   Unless discharge instructions indicate otherwise, leave your bandage dry and in place overnight.  Remove the bandage during your first bowel movement.   b. Allow the wound packing to fall out over the next few days.  You can trim exposed gauze / ribbon as it falls out.  You do not need to repack the wound unless instructed otherwise.  Wear an absorbent pad or soft cotton gauze in your underwear as needed to catch any drainage and help keep the area  c. Keep the area clean and dry.  Bathe / shower every day.  Keep the area clean by showering / bathing over the incision / wound.   It is okay to soak an open wound to help wash it.  Wet wipes or showers / gentle washing after bowel movements is often less traumatic than regular toilet paper. d. Dennis Bast may have some styrofoam-like soft packing in the rectum which will come out with the first bowel movement.  e. You will often notice bleeding with bowel movements.  This should slow down by the end of the first week of surgery f. Expect some drainage.  This should slow down, too, by the end of the first week of surgery.  Wear an absorbent pad or soft cotton gauze in your underwear until the drainage stops. 6. ACTIVITIES as tolerated:   a. You may resume regular (light) daily activities beginning the next day--such as daily self-care, walking, climbing stairs--gradually increasing activities as tolerated.  If you can walk 30 minutes without difficulty, it is safe to try more intense activity such as jogging, treadmill, bicycling, low-impact aerobics, swimming, etc. b. Save the most intensive and strenuous activity for last such as sit-ups, heavy lifting, contact sports, etc  Refrain from any heavy lifting or straining until you are off narcotics for pain control.   c. DO NOT PUSH THROUGH PAIN.  Let pain be your guide: If it hurts to do something, don't do it.  Pain is your body warning you to avoid that activity for another week until the pain goes down. d. You may  drive when you are no longer taking prescription pain medication, you can comfortably sit for long periods of time, and you can safely maneuver your car and apply brakes. e. Dennis Bast may have sexual intercourse when it is comfortable.  7. FOLLOW UP in our office a. Please call [facility] at [phone] to set up an appointment to see your surgeon in the office for a follow-up appointment approximately 2 weeks after your surgery. b. Make sure that you call for this appointment the day you arrive home to insure a convenient appointment time. 10. IF YOU HAVE DISABILITY OR FAMILY LEAVE FORMS, BRING THEM TO THE OFFICE FOR PROCESSING.  DO NOT GIVE THEM TO YOUR DOCTOR.        WHEN TO CALL us [phone]: 1. Poor pain control 2. Reactions / problems with new medications (rash/itching, nausea, etc)  3. Fever over 101.5 F (38.5 C) 4. Inability to urinate 5. Nausea and/or vomiting 6. Worsening swelling or bruising 7. Continued bleeding from incision. 8. Increased pain, redness, or drainage from the incision  The clinic staff is available to answer your questions during regular business hours (8:30am-5pm).  Please dont hesitate to call and ask to speak to one of our nurses for clinical concerns.   A surgeon from [facility] is always on call at the hospitals   If you have a medical emergency, go to the nearest emergency room or call 911.    [facility], [place], [place] [address], [place], [place]  [address] ? MAIN: [phone] ? TOLL FREE: [phone] ? FAX [phone] [email or web address]  Managing Pain  Pain after surgery or related to activity is often due to strain/injury to muscle, tendon, nerves and/or incisions.  This pain is usually short-term and will improve in a few months.   Many people find it helpful to do the following things TOGETHER to help speed the process of healing and to get back to regular activity more  quickly:  1. Avoid heavy physical activity at first a. No lifting greater than 20 pounds at first, then increase to lifting as tolerated over the next few weeks b. Do not push through the pain.  Listen to your body and avoid positions and maneuvers than reproduce the pain.  Wait a few days before trying something more intense c. Walking is okay as tolerated, but go slowly and stop when getting sore.  If you can walk 30 minutes without stopping or pain, you can try more intense activity (running, jogging, aerobics, cycling, swimming, treadmill, sex, sports, weightlifting, etc ) d. Remember: If it hurts to do it, then dont do it!  2. Take Anti-inflammatory medication a. Choose ONE of the following over-the-counter medications: i.            Acetaminophen 500mg  tabs (Tylenol) 1-2 pills with every meal and just before bedtime (avoid if you have liver problems) ii.            Naproxen 220mg  tabs (ex. Aleve) 1-2 pills twice a day (avoid if you have kidney, stomach, IBD, or bleeding problems) iii. Ibuprofen 200mg  tabs (ex. Advil, Motrin) 3-4 pills with every meal and just before bedtime (avoid if you have kidney, stomach, IBD, or bleeding problems) b. Take with food/snack around the clock for 1-2 weeks i. This helps the muscle and nerve tissues become less irritable and calm down faster  3. Use a Heating pad or Ice/Cold Pack a. 4-6 times a day b. May use warm bath/hottub  or showers  4. Try Gentle Massage and/or Stretching  a. at the area of pain many times a day b. stop if you feel pain - do not overdo it  Try these steps together to help you body heal faster and avoid making things get worse.  Doing just one of these things may not be enough.    If you are not getting better after two weeks or are noticing you are getting worse, contact our office for further advice; we may need to re-evaluate you & see what other things we can do to help.  GETTING TO GOOD BOWEL HEALTH. Irregular bowel  habits such as constipation and diarrhea can lead to many problems over time.  Having one soft bowel movement a day is the most important way to prevent further problems.  The anorectal canal is designed to  handle stretching and feces to safely manage our ability to get rid of solid waste (feces, poop, stool) out of our body.  BUT, hard constipated stools can act like ripping concrete bricks and diarrhea can be a burning fire to this very sensitive area of our body, causing inflamed hemorrhoids, anal fissures, increasing risk is perirectal abscesses, abdominal pain/bloating, an making irritable bowel worse.     The goal: ONE SOFT BOWEL MOVEMENT A DAY!  To have soft, regular bowel movements:   Drink at least 8 tall glasses of water a day.    Take plenty of fiber.  Fiber is the undigested part of plant food that passes into the colon, acting s natures broom to encourage bowel motility and movement.  Fiber can absorb and hold large amounts of water. This results in a larger, bulkier stool, which is soft and easier to pass. Work gradually over several weeks up to 6 servings a day of fiber (25g a day even more if needed) in the form of: o Vegetables -- Root (potatoes, carrots, turnips), leafy green (lettuce, salad greens, celery, spinach), or cooked high residue (cabbage, broccoli, etc) o Fruit -- Fresh (unpeeled skin & pulp), Dried (prunes, apricots, cherries, etc ),  or stewed ( applesauce)  o Whole grain breads, pasta, etc (whole wheat)  o Bran cereals   Bulking Agents -- This type of water-retaining fiber generally is easily obtained each day by one of the following:  o Psyllium bran -- The psyllium plant is remarkable because its ground seeds can retain so much water. This product is available as Metamucil, Konsyl, Effersyllium, Per Diem Fiber, or the less expensive generic preparation in drug and health food stores. Although labeled a laxative, it really is not a laxative.  o Methylcellulose -- This  is another fiber derived from wood which also retains water. It is available as Citrucel. o Polyethylene Glycol - and artificial fiber commonly called Miralax or Glycolax.  It is helpful for people with gassy or bloated feelings with regular fiber o Flax Seed - a less gassy fiber than psyllium  No reading or other relaxing activity while on the toilet. If bowel movements take longer than 5 minutes, you are too constipated  AVOID CONSTIPATION.  High fiber and water intake usually takes care of this.  Sometimes a laxative is needed to stimulate more frequent bowel movements, but   Laxatives are not a good long-term solution as it can wear the colon out. o Osmotics (Milk of Magnesia, Fleets phosphosoda, Magnesium citrate, MiraLax, GoLytely) are safer than  o Stimulants (Senokot, Castor Oil, Dulcolax, Ex Lax)    o Do not take laxatives for more than 7days in a row.   IF SEVERELY CONSTIPATED, try a Bowel Retraining Program: o Do not use laxatives.  o Eat a diet high in roughage, such as bran cereals and leafy vegetables.  o Drink six (6) ounces of prune or apricot juice each morning.  o Eat two (2) large servings of stewed fruit each day.  o Take one (1) heaping tablespoon of a psyllium-based bulking agent twice a day. Use sugar-free sweetener when possible to avoid excessive calories.  o Eat a normal breakfast.  o Set aside 15 minutes after breakfast to sit on the toilet, but do not strain to have a bowel movement.  o If you do not have a bowel movement by the third day, use an enema and repeat the above steps.   Controlling diarrhea o Switch to liquids and  simpler foods for a few days to avoid stressing your intestines further. o Avoid dairy products (especially milk & ice cream) for a short time.  The intestines often can lose the ability to digest lactose when stressed. o Avoid foods that cause gassiness or bloating.  Typical foods include beans and other legumes, cabbage, broccoli, and  dairy foods.  Every person has some sensitivity to other foods, so listen to our body and avoid those foods that trigger problems for you. o Adding fiber (Citrucel, Metamucil, psyllium, Miralax) gradually can help thicken stools by absorbing excess fluid and retrain the intestines to act more normally.  Slowly increase the dose over a few weeks.  Too much fiber too soon can backfire and cause cramping & bloating. o Probiotics (such as active yogurt, Align, etc) may help repopulate the intestines and colon with normal bacteria and calm down a sensitive digestive tract.  Most studies show it to be of mild help, though, and such products can be costly. o Medicines: - Bismuth subsalicylate (ex. Kayopectate, Pepto Bismol) every 30 minutes for up to 6 doses can help control diarrhea.  Avoid if pregnant. - Loperamide (Immodium) can slow down diarrhea.  Start with two tablets (4mg  total) first and then try one tablet every 6 hours.  Avoid if you are having fevers or severe pain.  If you are not better or start feeling worse, stop all medicines and call your doctor for advice o Call your doctor if you are getting worse or not better.  Sometimes further testing (cultures, endoscopy, X-ray studies, bloodwork, etc) may be needed to help diagnose and treat the cause of the diarrhea.  Pelvic floor muscle training exercises ("Kegels") can help strengthen the muscles under the uterus, bladder, and bowel (large intestine). They can help both men and women who have problems with urine leakage or bowel control.  A pelvic floor muscle training exercise is like pretending that you have to urinate, and then holding it. You relax and tighten the muscles that control urine flow. It's important to find the right muscles to tighten.  The next time you have to urinate, start to go and then stop. Feel the muscles in your vagina, bladder, or anus get tight and move up. These are the pelvic floor muscles. If you feel them tighten,  you've done the exercise right. If you are still not sure whether you are tightening the right muscles, keep in mind that all of the muscles of the pelvic floor relax and contract at the same time. Because these muscles control the bladder, rectum, and vagina, the following tips may help: Women: Insert a finger into your vagina. Tighten the muscles as if you are holding in your urine, then let go. You should feel the muscles tighten and move up and down.  Men: Insert a finger into your rectum. Tighten the muscles as if you are holding in your urine, then let go. You should feel the muscles tighten and move up and down. These are the same muscles you would tighten if you were trying to prevent yourself from passing gas.  It is very important that you keep the following muscles relaxed while doing pelvic floor muscle training exercises: Abdominal  Buttocks (the deeper, anal sphincter muscle should contract)  Thigh   A woman can also strengthen these muscles by using a vaginal cone, which is a weighted device that is inserted into the vagina. Then you try to tighten the pelvic floor muscles to hold the device  in place. If you are unsure whether you are doing the pelvic floor muscle training correctly, you can use biofeedback and electrical stimulation to help find the correct muscle group to work. Biofeedback is a method of positive reinforcement. Electrodes are placed on the abdomen and along the anal area. Some therapists place a sensor in the vagina in women or anus in men to monitor the contraction of pelvic floor muscles.  A monitor will display a graph showing which muscles are contracting and which are at rest. The therapist can help find the right muscles for performing pelvic floor muscle training exercises.   PERFORMING PELVIC FLOOR EXERCISES: 1. Begin by emptying your bladder. 2. Tighten the pelvic floor muscles and hold for a count of 10. 3. Relax the muscles completely for a count of  10. 4. Do 10 repititions, 3 to 5 times a day (morning, afternoon, and night). You can do these exercises at any time and any place. Most people prefer to do the exercises while lying down or sitting in a chair. After 4 - 6 weeks, most people notice some improvement. It may take as long as 3 months to see a major change. After a couple of weeks, you can also try doing a single pelvic floor contraction at times when you are likely to leak (for example, while getting out of a chair). A word of caution: Some people feel that they can speed up the progress by increasing the number of repetitions and the frequency of exercises. However, over-exercising can instead cause muscle fatigue and increase urine leakage. If you feel any discomfort in your abdomen or back while doing these exercises, you are probably doing them wrong. Breathe deeply and relax your body when you are doing these exercises. Make sure you are not tightening your stomach, thigh, buttock, or chest muscles. When done the right way, pelvic floor muscle exercises have been shown to be very effective at improving urinary continence.  Pelvic Floor Pain / Incontinence  Do you suffer from pelvic pain or incontinence? Do you have pain in the pelvis, low back or hips that is associated with sitting, walking, urination or intercourse? Have you experienced leaking of urine or feces when coughing, sneezing or laughing? Do you have pain in the pelvic area associated with cancer?  These are conditions that are common with pelvic floor muscle dysfunction. Over time, due to stress, scar tissue, surgeries and the natural course of aging, our muscles may become weak or overstressed and can spasm. This can lead to pain, weakness, incontinence or decreased quality of life.  Men and women with pelvic floor dysfunction frequently describe:  A falling out feeling. Pain or burning in the abdomen, tailbone or perineal area. Constipation or bowel elimination  problems or difficulty initiating urination. Unresolved low back or hip pain. Frequency and urgency when going to the bathroom. Leaking of urine or feces. Pain with intercourse.  [email or web address]  To make a referral or for more information about [facility] Pelvic Floor Therapy Program, call  [facility]) - [place] [phone] [place]) - [place] [facility]) - [phone]

## 2014-07-19 NOTE — Progress Notes (Signed)
Per Dr Burnett Sheng, ok to hold bisoprolol for pulse of 57,BP of 113 71

## 2014-07-19 NOTE — H&P (Signed)
Donovan S. Owens Shark  Location: [facility] Patient #: [id number] DOB: 03-21-1956 Married / Language: English / Race: White Female  History of Present Illness [name] MD; 06/14/2014 10:49 AM) Patient words: rectal polyp.  The patient is a 59 year old female who presents with a colonic polyp. Patient sent by her gastroenterologist, Dr. Zenovia Jarred, for concern of very distal rectal adenomatous polyp. Not able to be removed safely endoscopically. Consider transanal resection. Pleasant active woman. History of Nissen fundoplication and cholecystectomy. No other abdominal surgeries. History colon polyps. Underwent colonoscopy. Polyps removed. Pedunculated distal rectal polyp mass noted. Felt to distal to be removed endoscopically. Surgical consultation requested for transanal excision. Patient has some mild irritable bowel. Often has a few bowel today. Not severe. No severe rectal bleeding or melena. Comes today with her husband. Did have a bad rash with her shoulder surgery. They told her she had jock itch on the shoulder which confuses her. No history of groin or candidal rashes or infections that she knows of. No history of MRSA. Has had mild incontinence to flatus and sometimes even stools with her irritable bowel. Not severe.   Other Problems [name], CMA; 06/14/2014 9:45 AM) Anxiety Disorder Back Pain Cholelithiasis Depression Diabetes Mellitus Gastroesophageal Reflux Disease Hemorrhoids High blood pressure Hypercholesterolemia Migraine Headache Ventral Hernia Repair  Past Surgical History [name], CMA; 06/14/2014 9:45 AM) Colon Polyp Removal - Colonoscopy Gallbladder Surgery - Laparoscopic Nissen Fundoplication Shoulder Surgery Right. Ventral / Umbilical Hernia Surgery Left.  Diagnostic Studies History [name], CMA; 06/14/2014 9:45 AM) Colonoscopy within last year Mammogram within last year Pap Smear 1-5 years  ago  Allergies [name], CMA; 06/14/2014 9:47 AM) Lidocaine (5%)/Dextrose 7.5% *LOCAL ANESTHETICS-Parenteral* MetFORMIN HCl *ANTIDIABETICS* Morphine Sulfate (Concentrate) *ANALGESICS - OPIOID* Penicillamine *ASSORTED CLASSES* Wellbutrin *ANTIDEPRESSANTS*  Medication History ([name], CMA; 06/14/2014 9:48 AM) Vyvanse (40MG  Capsule, Oral) Active. ALPRAZolam (0.5MG  Tablet, Oral) Active. Bisoprolol-Hydrochlorothiazide (10-6.25MG  Tablet, Oral) Active. Cyclobenzaprine HCl (5MG  Tablet, Oral) Active. Escitalopram Oxalate (20MG  Tablet, Oral) Active. Farxiga (10MG  Tablet, Oral) Active. Levocetirizine Dihydrochloride (5MG  Tablet, Oral) Active. Medications Reconciled  Social History [name], CMA; 06/14/2014 9:45 AM) Alcohol use Occasional alcohol use. Caffeine use Carbonated beverages, Tea. No drug use Tobacco use Never smoker.  Family History [name], CMA; 06/14/2014 9:45 AM) Arthritis Father, Mother. Breast Cancer Father, Mother. Cancer Father, Mother, Sister. Cervical Cancer Family Members In [place], Father. Colon Polyps Brother, Father, Sister. Depression Daughter. Diabetes Mellitus Father. Hypertension Father. Kidney Disease Father. Melanoma Father. Ovarian Cancer Family Members In [place]. Prostate Cancer Father.  Pregnancy / Birth History [name], [place]; 06/14/2014 9:45 AM) Age at menarche 44 years. Age of menopause 32-50 Contraceptive History Oral contraceptives. Gravida 3 Maternal age 25-25 Para 2  Review of Systems ([place]; 06/14/2014 9:45 AM) General Present- Fatigue. Not Present- Appetite Loss, Chills, Fever, Night Sweats, Weight Gain and Weight Loss. Skin Not Present- Change in Wart/Mole, Dryness, Hives, Jaundice, New Lesions, Non-Healing Wounds, Rash and Ulcer. HEENT Present- Ringing in the Ears, Seasonal Allergies and Wears glasses/contact lenses. Not Present- Earache, Hearing  Loss, Hoarseness, Nose Bleed, Oral Ulcers, Sinus Pain, Sore Throat, Visual Disturbances and Yellow Eyes. Respiratory Present- Snoring. Not Present- Bloody sputum, Chronic Cough, Difficulty Breathing and Wheezing. Breast Not Present- Breast Mass, Breast Pain, Nipple Discharge and Skin Changes. Cardiovascular Not Present- Chest Pain, Difficulty Breathing Lying Down, Leg Cramps, Palpitations, Rapid Heart Rate, Shortness of Breath and Swelling of Extremities. Gastrointestinal Present- Excessive gas, Hemorrhoids and Rectal Pain.  Not Present- Abdominal Pain, Bloating, Bloody Stool, Change in Bowel Habits, Chronic diarrhea, Constipation, Difficulty Swallowing, Gets full quickly at meals, Indigestion, Nausea and Vomiting. Female Genitourinary Not Present- Frequency, Nocturia, Painful Urination, Pelvic Pain and Urgency. Musculoskeletal Present- Joint Pain and Joint Stiffness. Not Present- Back Pain, Muscle Pain, Muscle Weakness and Swelling of Extremities. Neurological Not Present- Decreased Memory, Fainting, Headaches, Numbness, Seizures, Tingling, Tremor, Trouble walking and Weakness. Psychiatric Present- Anxiety and Depression. Not Present- Bipolar, Change in Sleep Pattern, Fearful and Frequent crying. Endocrine Present- Heat Intolerance and New Diabetes. Not Present- Cold Intolerance, Excessive Hunger, Hair Changes and Hot flashes. Hematology Present- Easy Bruising. Not Present- Excessive bleeding, Gland problems, HIV and Persistent Infections.   Vitals ([name] CMA; 06/14/2014 9:46 AM) 06/14/2014 9:46 AM Weight: 165 lb Height: 64in Body Surface Area: 1.84 m Body Mass Index: 28.32 kg/m Temp.: 97.52F(Temporal)  Pulse: 66 (Regular)  BP: 124/72 (Sitting, Left Arm, Standard)    Physical Exam [name] MD; 06/14/2014 10:33 AM) General Mental Status-Alert. General Appearance-Not in acute distress, Not Sickly. Orientation-Oriented X3. Hydration-Well  hydrated. Voice-Normal.  Integumentary Global Assessment Upon inspection and palpation of skin surfaces of the - Axillae: non-tender, no inflammation or ulceration, no drainage. and Distribution of scalp and body hair is normal. General Characteristics Temperature - normal warmth is noted.  Head and Neck Head-normocephalic, atraumatic with no lesions or palpable masses. Face Global Assessment - atraumatic, no absence of expression. Neck Global Assessment - no abnormal movements, no bruit auscultated on the right, no bruit auscultated on the left, no decreased range of motion, non-tender. Trachea-midline. Thyroid Gland Characteristics - non-tender.  Eye Eyeball - Left-Extraocular movements intact, No Nystagmus. Eyeball - Right-Extraocular movements intact, No Nystagmus. Cornea - Left-No Hazy. Cornea - Right-No Hazy. Sclera/Conjunctiva - Left-No scleral icterus, No Discharge. Sclera/Conjunctiva - Right-No scleral icterus, No Discharge. Pupil - Left-Direct reaction to light normal. Pupil - Right-Direct reaction to light normal.  ENMT Ears Pinna - Left - no drainage observed, no generalized tenderness observed. Right - no drainage observed, no generalized tenderness observed. Nose and Sinuses External Inspection of the Nose - no destructive lesion observed. Inspection of the nares - Left - quiet respiration. Right - quiet respiration. Mouth and Throat Lips - Upper Lip - no fissures observed, no pallor noted. Lower Lip - no fissures observed, no pallor noted. Nasopharynx - no discharge present. Oral Cavity/Oropharynx - Tongue - no dryness observed. Oral Mucosa - no cyanosis observed. Hypopharynx - no evidence of airway distress observed.  Chest and Lung Exam Inspection Movements - Normal and Symmetrical. Accessory muscles - No use of accessory muscles in breathing. Palpation Palpation of the chest reveals - Non-tender. Auscultation Breath sounds - Normal  and Clear.  Cardiovascular Auscultation Rhythm - Regular. Murmurs & Other Heart Sounds - Auscultation of the heart reveals - No Murmurs and No Systolic Clicks.  Abdomen Inspection Inspection of the abdomen reveals - No Visible peristalsis and No Abnormal pulsations. Umbilicus - No Bleeding, No Urine drainage. Palpation/Percussion Palpation and Percussion of the abdomen reveal - Soft, Non Tender, No Rebound tenderness, No Rigidity (guarding) and No Cutaneous hyperesthesia. Note: Abdomen soft and flat no diastases. No umbilical hernia. No abdominal pain   Female Genitourinary Sexual Maturity Tanner 5 - Adult hair pattern. Note: No vaginal bleeding nor discharge. No inguinal hernias   Rectal Note: Perianal skin clean. No fissure. No fistula. No pilonidal disease. No pruritus. No external hemorrhoids. Normal sphincter tone. 2 cm from the anal verge in the LEFT  anterior aspect is a pedunculated polyp. The stalk about 1 cm. Mass about 2 cm. Friable. Mobile. No other rectal masses felt. No anoscopy done.   Peripheral Vascular Upper Extremity Inspection - Left - No Cyanotic nailbeds, Not Ischemic. Right - No Cyanotic nailbeds, Not Ischemic.  Neurologic Neurologic evaluation reveals -normal attention span and ability to concentrate, able to name objects and repeat phrases. Appropriate fund of knowledge , normal sensation and normal coordination. Mental Status Affect - not angry, not paranoid. Cranial Nerves-Normal Bilaterally. Gait-Normal.  Neuropsychiatric Mental status exam performed with findings of-able to articulate well with normal speech/language, rate, volume and coherence, thought content normal with ability to perform basic computations and apply abstract reasoning and no evidence of hallucinations, delusions, obsessions or homicidal/suicidal ideation.  Musculoskeletal Global Assessment Spine, Ribs and Pelvis - no instability, subluxation or laxity. Right Upper  Extremity - no instability, subluxation or laxity.  Lymphatic Head & Neck  General Head & Neck Lymphatics: Bilateral - Description - No Localized lymphadenopathy. Axillary  General Axillary Region: Bilateral - Description - No Localized lymphadenopathy. Femoral & Inguinal  Generalized Femoral & Inguinal Lymphatics: Left - Description - No Localized lymphadenopathy. Right - Description - No Localized lymphadenopathy.    Assessment & Plan [name] MD; 06/14/2014 10:52 AM) ADENOMATOUS RECTAL POLYP (211.4  D12.8) Impression: Distal adenomatous rectal polyp. Most likely would benefit from transanal resection. Reasonable start out with combined TEM/transanal approach. That allows full thickness resection with controlled margins. Because of her strong family history of colorectal cancer, she is very motivated to "get this thing done"  LEFT anterior 2:00 positioning and classic lithotomy position. Therefore plan prone positioning. Possible lateral decubitus as well. Current Plans  Schedule for Surgery Written instructions provided Discussed regular exercise with patient. The anatomy & physiology of the digestive tract was discussed. The pathophysiology of the rectal pathology was discussed. Natural history risks without surgery was discussed. I feel the risks of no intervention will lead to serious problems that outweigh the operative risks; therefore, I recommended surgery.  Laparoscopic & open abdominal techniques were discussed. I recommended we start with a partial proctectomy by transanal endoscopic microsurgery (TEM) for excisional biopsy to remove the pathology and hopefully cure and/or control the pathology. This technique can offer less operative risk and faster post-operative recovery. Possible need for immediate or later abdominal surgery for further treatment was discussed.  Risks such as bleeding, abscess, reoperation, ostomy, heart attack, death, and other risks were  discussed. I noted a good likelihood this will help address the problem. Goals of post-operative recovery were discussed as well. We will work to minimize complications. An educational handout was given as well. Questions were answered. The patient expresses understanding & wishes to proceed with surgery. Pt Education - [facility] TEM Education (Durinda Buzzelli): discussed with patient and provided information. Pt Education - [facility] Rectal Surgery HCI (Hazleigh Mccleave): discussed with patient and provided information. Pt Education - [facility] Good Bowel Health (Sible Straley) Pt Education - [facility] Pain Control (Raizel Wesolowski)  [name], M.D., F.A.C.S. Gastrointestinal and Minimally Invasive Surgery [facility] [address], [place], [place] [address] [phone] Main / Paging

## 2014-07-19 NOTE — Anesthesia Preprocedure Evaluation (Addendum)
Anesthesia Evaluation  Patient identified by MRN, date of birth, ID band  Reviewed: Allergy & Precautions, NPO status , Patient's Chart, lab work & pertinent test results  History of Anesthesia Complications Negative for: history of anesthetic complications  Airway Mallampati: II  TM Distance: <3 FB Neck ROM: Full    Dental  (+) Teeth Intact   Pulmonary asthma ,  breath sounds clear to auscultation        Cardiovascular hypertension, Rhythm:Regular Rate:Normal     Neuro/Psych PSYCHIATRIC DISORDERS    GI/Hepatic Neg liver ROS, hiatal hernia, GERD-  ,  Endo/Other  diabetes  Renal/GU negative Renal ROS     Musculoskeletal negative musculoskeletal ROS (+)   Abdominal (+) + obese,   Peds  Hematology negative hematology ROS (+)   Anesthesia Other Findings   Reproductive/Obstetrics                            Anesthesia Physical Anesthesia Plan  ASA: III  Anesthesia Plan: General   Post-op Pain Management:    Induction: Intravenous  Airway Management Planned: Oral ETT  Additional Equipment:   Intra-op Plan:   Post-operative Plan: Extubation in OR  Informed Consent: I have reviewed the patients History and Physical, chart, labs and discussed the procedure including the risks, benefits and alternatives for the proposed anesthesia with the patient or authorized representative who has indicated his/her understanding and acceptance.   Dental advisory given  Plan Discussed with: CRNA and Surgeon  Anesthesia Plan Comments:         Anesthesia Quick Evaluation

## 2014-07-19 NOTE — Interval H&P Note (Signed)
History and Physical Interval Note:  07/19/2014 2:26 PM  Sydney Dickson  has presented today for surgery, with the diagnosis of Rectal Polyp  The various methods of treatment have been discussed with the patient and family. After consideration of risks, benefits and other options for treatment, the patient has consented to  Procedure(s): PARTIAL PROCTECTOMY BY TEM (N/A) as a surgical intervention .  The patient's history has been reviewed, patient examined, no change in status, stable for surgery.  I have reviewed the patient's chart and labs.  Questions were answered to the patient's satisfaction.     Safwan Tomei C.

## 2014-07-19 NOTE — Transfer of Care (Signed)
Immediate Anesthesia Transfer of Care Note  Patient: Sydney Dickson  Procedure(s) Performed: Procedure(s): PARTIAL PROCTECTOMY BY TEM (N/A)  Patient Location: PACU  Anesthesia Type:General  Level of Consciousness: awake, alert  and oriented  Airway & Oxygen Therapy: Patient Spontanous Breathing and Patient connected to face mask oxygen  Post-op Assessment: Report given to RN and Post -op Vital signs reviewed and stable  Post vital signs: Reviewed and stable  Last Vitals:  Filed Vitals:   07/19/14 0946  BP: 113/71  Pulse: 57  Temp: 36.5 C  Resp: 20    Complications: No apparent anesthesia complications

## 2014-07-19 NOTE — Anesthesia Procedure Notes (Signed)
Procedure Name: Intubation Date/Time: 07/19/2014 2:55 PM Performed by: Noralyn Pick D Pre-anesthesia Checklist: Patient identified, Emergency Drugs available, Suction available and Patient being monitored Patient Re-evaluated:Patient Re-evaluated prior to inductionOxygen Delivery Method: Circle System Utilized Preoxygenation: Pre-oxygenation with 100% oxygen Intubation Type: IV induction Ventilation: Mask ventilation without difficulty Laryngoscope Size: Mac and 3 Grade View: Grade II Tube type: Oral Tube size: 7.5 mm Number of attempts: 1 Airway Equipment and Method: Stylet and Oral airway Placement Confirmation: ETT inserted through vocal cords under direct vision,  positive ETCO2 and breath sounds checked- equal and bilateral Secured at: 22 cm Tube secured with: Tape Dental Injury: Teeth and Oropharynx as per pre-operative assessment

## 2014-07-20 DIAGNOSIS — K621 Rectal polyp: Secondary | ICD-10-CM | POA: Diagnosis not present

## 2014-07-20 LAB — CBC
HCT: 43.4 % (ref 36.0–46.0)
Hemoglobin: 13.7 g/dL (ref 12.0–15.0)
MCH: 27.6 pg (ref 26.0–34.0)
MCHC: 31.6 g/dL (ref 30.0–36.0)
MCV: 87.3 fL (ref 78.0–100.0)
PLATELETS: 284 10*3/uL (ref 150–400)
RBC: 4.97 MIL/uL (ref 3.87–5.11)
RDW: 13.8 % (ref 11.5–15.5)
WBC: 12.6 10*3/uL — AB (ref 4.0–10.5)

## 2014-07-20 LAB — BASIC METABOLIC PANEL
ANION GAP: 7 (ref 5–15)
BUN: 14 mg/dL (ref 6–23)
CO2: 27 mmol/L (ref 19–32)
Calcium: 8.9 mg/dL (ref 8.4–10.5)
Chloride: 106 mmol/L (ref 96–112)
Creatinine, Ser: 0.68 mg/dL (ref 0.50–1.10)
GFR calc Af Amer: >90 mL/min (ref >90)
GFR calc non Af Amer: >90 mL/min (ref >90)
GLUCOSE: 149 mg/dL — AB (ref 70–99)
POTASSIUM: 4.2 mmol/L (ref 3.5–5.1)
SODIUM: 140 mmol/L (ref 135–145)

## 2014-07-20 NOTE — Progress Notes (Signed)
UR completed 

## 2014-07-20 NOTE — Discharge Summary (Signed)
Physician Discharge Summary  Patient ID: Sydney Dickson MRN: [id number] DOB/AGE: [birth year] 59 y.o.  Admit date: 07/19/2014 Discharge date: 07/20/2014  Patient Care Team: Unk Pinto, MD as PCP - General (Internal Medicine) Richmond Campbell, MD as Consulting Physician (Gastroenterology) Jannette Spanner, MD as Referring Physician (Dermatology) Amy Johnnye Sima, DO (Internal Medicine) Garald Balding, MD as Consulting Physician (Orthopedic Surgery) Rutherford Guys, MD as Consulting Physician (Ophthalmology) Lelon Perla, MD as Consulting Physician (Cardiology) Michael Boston, MD as Consulting Physician (General Surgery) Jerene Bears, MD as Consulting Physician (Gastroenterology)  Admission Diagnoses: Principal Problem:   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 Anterior midline, slightly left (1 o'clock by lithotomy). The mass was 2x1x1 cm in size  Pin placement on pathology specimen: Proximal margin: purple/lavender Distal margin: blue/teal Right lateral margin: White Left lateral margin: Yellow  the closure rests 0 cm from the anal verge in the anterior location  Consults: None  Hospital Course:   The patient underwent the surgery above.  Postoperatively, the patient gradually mobilized and advanced to a solid diet.  Pain and other symptoms were treated aggressively.    By the time of discharge, the patient was walking well the hallways, eating food, having flatus.  Pain was well-controlled on an oral medications.  Based on  meeting discharge criteria and continuing to recover, I felt it was safe for the patient to be discharged from the hospital to further recover with close followup. Postoperative recommendations were discussed in detail.  They are written as well.   Significant Diagnostic Studies:  Results for orders placed or performed during the hospital encounter of 07/19/14 (from the past 72 hour(s))  Glucose, capillary     Status: Abnormal   Collection Time: 07/19/14  9:49 AM  Result Value Ref Range   Glucose-Capillary 117 (H) 70 - 99 mg/dL  Type and screen     Status: None   Collection Time: 07/19/14 10:25 AM  Result Value Ref Range   ABO/RH(D) O POS    Antibody Screen NEG    Sample Expiration 07/22/2014   ABO/Rh     Status: None   Collection Time: 07/19/14 10:25 AM  Result Value Ref Range   ABO/RH(D) O POS   Glucose, capillary     Status: Abnormal   Collection Time: 07/19/14  5:50 PM  Result Value Ref Range   Glucose-Capillary 110 (H) 70 - 99 mg/dL  Glucose, capillary     Status: Abnormal   Collection Time: 07/19/14  9:25 PM  Result Value Ref Range   Glucose-Capillary 204 (H) 70 - 99 mg/dL   Comment 1 Notify RN   Basic metabolic panel     Status: Abnormal   Collection Time: 07/20/14  4:00 AM  Result Value Ref Range   Sodium 140 135 - 145 mmol/L   Potassium 4.2 3.5 - 5.1 mmol/L   Chloride 106 96 - 112 mmol/L   CO2 27 19 - 32 mmol/L   Glucose, Bld 149 (H) 70 - 99 mg/dL   BUN 14 6 - 23 mg/dL   Creatinine, Ser 0.68 0.50 -  1.10 mg/dL   Calcium 8.9 8.4 - 10.5 mg/dL   GFR calc non Af Amer >90 >90 mL/min   GFR calc Af Amer >90 >90 mL/min    Comment: (NOTE) The eGFR has been calculated using the CKD EPI equation. This calculation has not been validated in all clinical situations. eGFR's persistently <90 mL/min signify possible Chronic Kidney Disease.    Anion gap 7 5 - 15  CBC     Status: Abnormal   Collection Time: 07/20/14  4:00 AM  Result Value Ref Range   WBC 12.6 (H) 4.0 - 10.5  K/uL   RBC 4.97 3.87 - 5.11 MIL/uL   Hemoglobin 13.7 12.0 - 15.0 g/dL   HCT 43.4 36.0 - 46.0 %   MCV 87.3 78.0 - 100.0 fL   MCH 27.6 26.0 - 34.0 pg   MCHC 31.6 30.0 - 36.0 g/dL   RDW 13.8 11.5 - 15.5 %   Platelets 284 150 - 400 K/uL    No results found.  Discharge Exam: Blood pressure 98/56, pulse 72, temperature 98.7 F (37.1 C), temperature source Oral, resp. rate 16, height _0  (1.626 m), weight 75.524 kg (166 lb 8 oz), SpO2 98 %.  General: Pt awake/alert/oriented x4 in no major acute distress Eyes: PERRL, normal EOM. Sclera nonicteric Neuro: CN II-XII intact w/o focal sensory/motor deficits. Lymph: No head/neck/groin lymphadenopathy Psych:  No delerium/psychosis/paranoia HENT: Normocephalic, Mucus membranes moist.  No thrush Neck: Supple, No tracheal deviation Chest: No pain.  Good respiratory excursion. CV:  Pulses intact.  Regular rhythm MS: Normal AROM mjr joints.  No obvious deformity Abdomen: Soft, Nondistended.  Nontender.  No incarcerated hernias. Ext:  SCDs BLE.  No significant edema.  No cyanosis Skin: No petechiae / purpura  Discharged Condition: good   Past Medical History  Diagnosis Date  . Hypertension   . ADD (attention deficit disorder)   . Depression   . IBS (irritable bowel syndrome)   . Hyperlipidemia 2004  . Diabetes mellitus without complication 0354  . Asthma     with severe allergies pt does use albuterol inhaler when needed  . Anxiety   . GERD (gastroesophageal reflux disease)     hx of had Nissen Fundoplication 6568 has not had any issues since  . History of hiatal hernia     Past Surgical History  Procedure Laterality Date  . Hernia repair    . Cholecystectomy    . Tubal ligation    . Cosmetic surgery      rhinoplasty  . Rotator cuff repair Right 2012  . Eye surgery  2006    lasik  . Nissen fundoplication  1275  . Carpal tunnel release  1998, 2001  . Nasal sinus surgery    . Colonscopy     . Upper gi endoscopy       History   Social History  . Marital Status: Married    Spouse Name: N/A  . Number of Children: N/A  . Years of Education: N/A   Occupational History  . Not on file.   Social History Main Topics  . Smoking status: Never Smoker   . Smokeless tobacco: Never Used  . Alcohol Use: No  . Drug Use: No  . Sexual Activity: Not on file   Other Topics Concern  . Not on file   Social History Narrative    Family History  Problem Relation Age of Onset  . Cancer Mother     breast  . Hyperlipidemia Mother   .  Hypertension Mother   . Diabetes Mother   . Breast cancer Mother   . Cancer Father     breast, colon, skin, prostate  . Hyperlipidemia Father   . Hypertension Father   . Colon cancer Father 57  . Cancer Sister     breast  . Breast cancer Sister   . Cancer Brother 22    colon  . Colon cancer Brother 68  . Cancer Paternal Grandmother     ovary  . Cancer Paternal Grandfather     colon  . Colon cancer Paternal Grandfather 9  . Stomach cancer Neg Hx   . Esophageal cancer Neg Hx     Current Facility-Administered Medications  Medication Dose Route Frequency Provider Last Rate Last Dose  . 0.9 %  sodium chloride infusion  250 mL Intravenous PRN Michael Boston, MD      . acetaminophen (TYLENOL) tablet 1,000 mg  1,000 mg Oral TID Michael Boston, MD   1,000 mg at 07/19/14 2210  . ALPRAZolam Duanne Moron) tablet 0.5 mg  0.5 mg Oral BID PRN Michael Boston, MD      . alum & mag hydroxide-simeth (MAALOX/MYLANTA) 200-200-20 MG/5ML suspension 30 mL  30 mL Oral Q6H PRN Michael Boston, MD      . bisoprolol-hydrochlorothiazide [facility]) 10-6.25 MG per tablet 1 tablet  1 tablet Oral Daily Michael Boston, MD      . dapagliflozin propanediol (FARXIGA) tablet 10 mg  10 mg Oral Daily Michael Boston, MD      . dextrose 5 % in lactated ringers infusion   Intravenous Continuous Michael Boston, MD   Stopped at 07/19/14 2300  . diphenhydrAMINE (BENADRYL) injection 12.5-25 mg  12.5-25 mg Intravenous Q6H PRN Michael Boston, MD      . escitalopram (LEXAPRO) tablet 20 mg  20 mg Oral Daily Michael Boston, MD      . fluticasone (FLONASE) 50 MCG/ACT nasal spray 1 spray  1 spray Each Nare Daily Michael Boston, MD      . heparin injection 5,000 Units  5,000 Units Subcutaneous 3 times per day Michael Boston, MD   5,000 Units at 07/20/14 0645  . hydrocortisone-pramoxine [facility]) 2.5-1 % rectal cream 1 application  1 application Rectal R4E PRN Michael Boston, MD      . HYDROmorphone (DILAUDID) injection 0.5-2 mg  0.5-2 mg Intravenous Q1H PRN Michael Boston, MD      . ibuprofen (ADVIL,MOTRIN) tablet 800 mg  800 mg Oral Q8H PRN Michael Boston, MD      . insulin aspart (novoLOG) injection 0-15 Units  0-15 Units Subcutaneous TID WC Michael Boston, MD      . insulin aspart (novoLOG) injection 0-5 Units  0-5 Units Subcutaneous QHS Michael Boston, MD   2 Units at 07/19/14 2210  . lactated ringers bolus 1,000 mL  1,000 mL Intravenous Q8H PRN Michael Boston, MD      . lip balm (CARMEX) ointment 1 application  1 application Topical BID Michael Boston, MD   1 application at 31/54/00 2200  . lisdexamfetamine (VYVANSE) capsule 40 mg  40 mg Oral Daily Michael Boston, MD      . magic mouthwash  15 mL Oral QID PRN Michael Boston, MD      . menthol-cetylpyridinium (CEPACOL) lozenge 3 mg  1 lozenge Oral PRN Michael Boston, MD      . metoprolol (LOPRESSOR) injection 5 mg  5 mg Intravenous Q6H PRN Michael Boston, MD      . metoprolol tartrate (LOPRESSOR) tablet 12.5 mg  12.5 mg Oral Q12H PRN Michael Boston, MD      . ondansetron [facility]) tablet 4 mg  4 mg Oral Q6H PRN Michael Boston, MD       Or  . ondansetron [facility]) injection 4 mg  4 mg Intravenous Q6H PRN Michael Boston, MD      . oxyCODONE (Oxy IR/ROXICODONE) immediate release tablet 5-10 mg  5-10 mg Oral Q4H PRN Michael Boston, MD   5 mg at 07/19/14 2223  . phenol (CHLORASEPTIC) mouth spray 2 spray  2 spray Mouth/Throat PRN Michael Boston, MD      . promethazine (PHENERGAN) injection 6.25-12.5 mg  6.25-12.5 mg  Intravenous Q4H PRN Michael Boston, MD      . rosuvastatin (CRESTOR) tablet 20 mg  20 mg Oral Carlis Abbott, MD      . sodium chloride 0.9 % injection 3 mL  3 mL Intravenous Q12H Michael Boston, MD   3 mL at 07/19/14 2200  . sodium chloride 0.9 % injection 3 mL  3 mL Intravenous PRN Michael Boston, MD      . witch hazel-glycerin (TUCKS) pad 1 application  1 application Topical PRN Michael Boston, MD         Allergies  Allergen Reactions  . Tape Rash  . Metformin And Related     myalgia  . Morphine And Related Itching  . Penicillins     Pt states that in 3rd grade she was given an injection in the buttock and reacted with a large hive in that area  . Wellbutrin [Bupropion]     Disposition:   Discharge Instructions    Call MD for:  extreme fatigue    Complete by:  As directed      Call MD for:  hives    Complete by:  As directed      Call MD for:  persistant nausea and vomiting    Complete by:  As directed      Call MD for:  redness, tenderness, or signs of infection (pain, swelling, redness, odor or green/yellow discharge around incision site)    Complete by:  As directed      Call MD for:  severe uncontrolled pain    Complete by:  As directed      Call MD for:    Complete by:  As directed   Temperature > 101.40F     Diet - low sodium heart healthy    Complete by:  As directed      Discharge instructions    Complete by:  As directed   Please see discharge instruction sheets.  Also refer to handout given an office.  Please call our office if you have any questions or concerns [phone]     Discharge wound care:    Complete by:  As directed   If you have closed incisions, shower and bathe over these incisions with soap and water every day.  Remove all surgical dressings on postoperative day #3.  You do not need to replace dressings over the closed incisions unless you feel more comfortable with a Band-Aid covering it.   If you have an open wound that requires packing, please  see wound care instructions.  In general, remove all dressings, wash wound with soap and water and then replace with saline moistened gauze.  Do the dressing change at least every day.  Please call our office [phone] if you have further questions.     Driving Restrictions    Complete  by:  As directed   No driving until off narcotics and can safely swerve away without pain during an emergency     Increase activity slowly    Complete by:  As directed   Walk an hour a day.  Use 20-30 minute walks.  When you can walk 30 minutes without difficulty, increase to low impact/moderate activities such as biking, jogging, swimming, sexual activity..  Eventually can increase to unrestricted activity when not feeling pain.  If you feel pain: STOP!Marland Kitchen   Let pain protect you from overdoing it.  Use ice/heat/over-the-counter pain medications to help minimize his soreness.  Use pain prescriptions as needed to remain active.  It is better to take extra pain medications and be more active than to stay bedridden to avoid all pain medications.     Lifting restrictions    Complete by:  As directed   Avoid heavy lifting initially.  Do not push through pain.  You have no specific weight limit.  Coughing and sneezing or four more stressful to your incision than any lifting you will do. Pain will protect you from injury.  Therefore, avoid intense activity until off all narcotic pain medications.  Coughing and sneezing or four more stressful to your incision than any lifting he will do.     May shower / Bathe    Complete by:  As directed      May walk up steps    Complete by:  As directed      Sexual Activity Restrictions    Complete by:  As directed   Sexual activity as tolerated.  Do not push through pain.  Pain will protect you from injury.     Walk with assistance    Complete by:  As directed   Walk over an hour a day.  May use a walker/cane/companion to help with balance and stamina.            Medication List     TAKE these medications        ALPRAZolam 0.5 MG tablet  Commonly known as:  XANAX  Take 1 tablet (0.5 mg total) by mouth 2 (two) times daily as needed.     bisoprolol-hydrochlorothiazide 10-6.25 MG per tablet  Commonly known as:  ZIAC  TAKE 1 TABLET BY MOUTH EVERY DAY FOR BLOOD PRESSURE     CRESTOR 20 MG tablet  Generic drug:  rosuvastatin  Take 1 tablet by mouth every other day.     escitalopram 20 MG tablet  Commonly known as:  LEXAPRO  TAKE 1 TABLET EVERY DAY     FARXIGA 10 MG Tabs tablet  Generic drug:  dapagliflozin propanediol  TAKE 1 TABLET BY MOUTH DAILY     glucose blood test strip  Commonly known as:  FREESTYLE LITE  Use as instructed     ibuprofen 800 MG tablet  Commonly known as:  ADVIL,MOTRIN  Take 1 tablet (800 mg total) by mouth every 8 (eight) hours as needed.     levocetirizine 5 MG tablet  Commonly known as:  XYZAL  TAKE 1 TABLET EVERY DAY     lisdexamfetamine 40 MG capsule  Commonly known as:  VYVANSE  Take 1 capsule (40 mg total) by mouth daily.     mometasone 50 MCG/ACT nasal spray  Commonly known as:  NASONEX  Place 2 sprays into the nose daily.     oxyCODONE 5 MG immediate release tablet  Commonly known as:  Oxy IR/ROXICODONE  Take 1-2 tablets (  5-10 mg total) by mouth every 4 (four) hours as needed for moderate pain, severe pain or breakthrough pain.           Follow-up Information    Follow up with Alaylah Heatherington C., MD. Schedule an appointment as soon as possible for a visit in 2 weeks.   Specialty:  General Surgery   Why:  To follow up after your hospital stay, To follow up after your operation   Contact information:   [place] [address] [phone]        Signed: Morton Peters, M.D., F.A.C.S. Gastrointestinal and Minimally Invasive Surgery [facility] [address] [place], [place] [address] [phone] Main / Paging   07/20/2014,  7:00 AM

## 2014-07-23 ENCOUNTER — Encounter (HOSPITAL_COMMUNITY): Payer: Self-pay | Admitting: Surgery

## 2014-07-23 NOTE — Anesthesia Postprocedure Evaluation (Signed)
  Anesthesia Post-op Note  Patient: Sydney Dickson  Procedure(s) Performed: Procedure(s): PARTIAL PROCTECTOMY BY TEM (N/A)  Patient Location: PACU  Anesthesia Type:General  Level of Consciousness: awake, alert  and oriented  Airway and Oxygen Therapy: Patient Spontanous Breathing  Post-op Pain: mild  Post-op Assessment: Post-op Vital signs reviewed  Post-op Vital Signs: stable  Last Vitals:  Filed Vitals:   07/20/14 0633  BP: 98/56  Pulse: 72  Temp: 37.1 C  Resp: 16    Complications: No apparent anesthesia complications

## 2014-07-24 ENCOUNTER — Telehealth: Payer: Self-pay | Admitting: *Deleted

## 2014-07-24 NOTE — Telephone Encounter (Signed)
-----   Message from Jerene Bears, MD sent at 07/24/2014  9:45 AM EDT ----- Doristine Devoid to hear Repeat colon for screening 4 yrs given tubular adenoma > 1 cm Thanks Molli Posey Please place 3 yr recall for colonoscopy JMP  ----- Message -----    From: Michael Boston, MD    Sent: 07/24/2014   8:02 AM      To: Unk Pinto, MD, Illene Regulus, #  Pathology shows benign results: Tubular adenoma.  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 - A TRANSMURAL DEFECT. - ALL OF THE RESECTION MARGINS ARE NEGATIVE FOR ATYPIA OR MALIGNANCY. Microscopic Comment  Alisha, CCS MA, please call on the patient to make sure recovery is going well & tell pt the good news on the pathology.  Thanks,  [name], M.D., F.A.C.S. Gastrointestinal and Minimally Invasive Surgery [facility] [address] [place], [place] [address] [phone] Main / Paging

## 2014-07-24 NOTE — Telephone Encounter (Signed)
Recall is in EPIC.

## 2014-08-04 ENCOUNTER — Other Ambulatory Visit: Payer: Self-pay | Admitting: Internal Medicine

## 2014-08-09 ENCOUNTER — Encounter: Payer: Self-pay | Admitting: *Deleted

## 2014-08-23 ENCOUNTER — Other Ambulatory Visit: Payer: Self-pay | Admitting: Physician Assistant

## 2014-08-23 ENCOUNTER — Encounter: Payer: Self-pay | Admitting: *Deleted

## 2014-08-23 MED ORDER — LISDEXAMFETAMINE DIMESYLATE 40 MG PO CAPS: 40.0000 mg | ORAL_CAPSULE | Freq: Every day | ORAL | Status: DC

## 2014-09-30 ENCOUNTER — Other Ambulatory Visit: Payer: Self-pay | Admitting: Internal Medicine

## 2014-09-30 DIAGNOSIS — F329 Major depressive disorder, single episode, unspecified: Secondary | ICD-10-CM

## 2014-09-30 DIAGNOSIS — F32A Depression, unspecified: Secondary | ICD-10-CM

## 2014-10-11 ENCOUNTER — Other Ambulatory Visit: Payer: Self-pay

## 2014-10-11 ENCOUNTER — Ambulatory Visit (INDEPENDENT_AMBULATORY_CARE_PROVIDER_SITE_OTHER): Payer: 59 | Admitting: Physician Assistant

## 2014-10-11 ENCOUNTER — Encounter: Payer: Self-pay | Admitting: Physician Assistant

## 2014-10-11 VITALS — BP 110/70 | HR 60 | Temp 97.7°F | Resp 16 | Ht 64.5 in | Wt 171.0 lb

## 2014-10-11 DIAGNOSIS — B373 Candidiasis of vulva and vagina: Secondary | ICD-10-CM

## 2014-10-11 DIAGNOSIS — Z79899 Other long term (current) drug therapy: Secondary | ICD-10-CM

## 2014-10-11 DIAGNOSIS — E785 Hyperlipidemia, unspecified: Secondary | ICD-10-CM

## 2014-10-11 DIAGNOSIS — E669 Obesity, unspecified: Secondary | ICD-10-CM

## 2014-10-11 DIAGNOSIS — I10 Essential (primary) hypertension: Secondary | ICD-10-CM

## 2014-10-11 DIAGNOSIS — E663 Overweight: Secondary | ICD-10-CM | POA: Insufficient documentation

## 2014-10-11 DIAGNOSIS — B3731 Acute candidiasis of vulva and vagina: Secondary | ICD-10-CM

## 2014-10-11 DIAGNOSIS — E559 Vitamin D deficiency, unspecified: Secondary | ICD-10-CM

## 2014-10-11 DIAGNOSIS — E119 Type 2 diabetes mellitus without complications: Secondary | ICD-10-CM

## 2014-10-11 LAB — CBC WITH DIFFERENTIAL/PLATELET
Basophils Absolute: 0.1 10*3/uL (ref 0.0–0.1)
Basophils Relative: 1 % (ref 0–1)
EOS ABS: 0.2 10*3/uL (ref 0.0–0.7)
Eosinophils Relative: 3 % (ref 0–5)
HCT: 45.5 % (ref 36.0–46.0)
HEMOGLOBIN: 15.2 g/dL — AB (ref 12.0–15.0)
LYMPHS PCT: 41 % (ref 12–46)
Lymphs Abs: 2.7 10*3/uL (ref 0.7–4.0)
MCH: 27.8 pg (ref 26.0–34.0)
MCHC: 33.4 g/dL (ref 30.0–36.0)
MCV: 83.3 fL (ref 78.0–100.0)
MPV: 9.8 fL (ref 8.6–12.4)
Monocytes Absolute: 0.5 10*3/uL (ref 0.1–1.0)
Monocytes Relative: 7 % (ref 3–12)
NEUTROS ABS: 3.2 10*3/uL (ref 1.7–7.7)
Neutrophils Relative %: 48 % (ref 43–77)
Platelets: 301 10*3/uL (ref 150–400)
RBC: 5.46 MIL/uL — AB (ref 3.87–5.11)
RDW: 13.8 % (ref 11.5–15.5)
WBC: 6.6 10*3/uL (ref 4.0–10.5)

## 2014-10-11 LAB — HEMOGLOBIN A1C
Hgb A1c MFr Bld: 7 % — ABNORMAL HIGH (ref <5.7)
Mean Plasma Glucose: 154 mg/dL — ABNORMAL HIGH (ref <117)

## 2014-10-11 MED ORDER — ENALAPRIL MALEATE 20 MG PO TABS: ORAL_TABLET | ORAL | Status: DC

## 2014-10-11 MED ORDER — DAPAGLIFLOZIN PROPANEDIOL 10 MG PO TABS: 10.0000 mg | ORAL_TABLET | Freq: Every day | ORAL | Status: DC

## 2014-10-11 MED ORDER — ROSUVASTATIN CALCIUM 20 MG PO TABS: 20.0000 mg | ORAL_TABLET | ORAL | Status: DC

## 2014-10-11 MED ORDER — FLUCONAZOLE 150 MG PO TABS: 150.0000 mg | ORAL_TABLET | Freq: Every day | ORAL | Status: DC

## 2014-10-11 MED ORDER — ROSUVASTATIN CALCIUM 20 MG PO TABS
20.0000 mg | ORAL_TABLET | ORAL | Status: DC
Start: 2014-10-11 — End: 2015-04-22

## 2014-10-11 NOTE — Patient Instructions (Signed)
Add ENTERIC COATED low dose 81 mg Aspirin daily OR can do every other day if you have easy bruising to protect your heart and head. As well as to reduce risk of Colon Cancer by 20 %, Skin Cancer by 26 % , Melanoma by 46% and Pancreatic cancer by 60%  ACE inhibitors are blood pressure medications that protect your heart and kidneys. It can cause two symptoms: The most common symptom is a dry cough/tickle in your throat that can happen the first day you take it or 5 years after you have been taking it. Please call us if you have this and we can switch it to a different medications. The least common side effect is called angioedema which is swelling of your lips and tongue and can cause problems with your breathing. This is a very very rare side effect but very serious. If this happens please stop the medication and go to the ER.   Diabetes is a very complicated disease...lets simplify it.  An easy way to look at it to understand the complications is if you think of the extra sugar floating in your blood stream as glass shards floating through your blood stream.    Diabetes affects your small vessels first: 1) The glass shards (sugar) scraps down the tiny blood vessels in your eyes and lead to diabetic retinopathy, the leading cause of blindness in the [place]. Diabetes is the leading cause of newly diagnosed adult (25 to 58 years of age) blindness in the [place].  2) The glass shards scratches down the tiny vessels of your legs leading to nerve damage called neuropathy and can lead to amputations of your feet. More than 60% of all non-traumatic amputations of lower limbs occur in people with diabetes.  3) Over time the small vessels in your brain are shredded and closed off, individually this does not cause any problems but over a long period of time many of the small vessels being blocked can lead to Vascular Dementia.   4) Your kidney's are a filter system and have a "net" that keeps certain  things in the body and lets bad things out. Sugar shreds this net and leads to kidney damage and eventually failure. Decreasing the sugar that is destroying the net and certain blood pressure medications can help stop or decrease progression of kidney disease. Diabetes was the primary cause of kidney failure in 44 percent of all new cases in 2011.  5) Diabetes also destroys the small vessels in your penis that lead to erectile dysfunction. Eventually the vessels are so damaged that you may not be responsive to cialis or viagra.   Diabetes and your large vessels: Your larger vessels consist of your coronary arteries in your heart and the carotid vessels to your brain. Diabetes or even increased sugars put you at 300% increased risk of heart attack and stroke and this is why.. The sugar scrapes down your large blood vessels and your body sees this as an internal injury and tries to repair itself. Just like you get a scab on your skin, your platelets will stick to the blood vessel wall trying to heal it. This is why we have diabetics on low dose aspirin daily, this prevents the platelets from sticking and can prevent plaque formation. In addition, your body takes cholesterol and tries to shove it into the open wound. This is why we want your LDL, or bad cholesterol, below 70.   The combination of platelets and cholesterol over 5-10  years forms plaque that can break off and cause a heart attack or stroke.   PLEASE REMEMBER:  Diabetes is preventable! Up to 60 percent of complications and morbidities among individuals with type 2 diabetes can be prevented, delayed, or effectively treated and minimized with regular visits to a health professional, appropriate monitoring and medication, and a healthy diet and lifestyle.   Your A1C is a measure of your sugar over the past 3 months and is not affected by what you have eaten over the past few days. Diabetes increases your chances of stroke and heart attack over  300 % and is the leading cause of blindness and kidney failure in the [place]. Please make sure you decrease bad carbs like white bread, white rice, potatoes, corn, soft drinks, pasta, cereals, refined sugars, sweet tea, dried fruits, and fruit juice. Good carbs are okay to eat in moderation like sweet potatoes, [place] rice, whole grain pasta/bread, most fruit (except dried fruit) and you can eat as many veggies as you want.   Greater than 6.5 is considered diabetic. Between 6.4 and 5.7 is prediabetic If your A1C is less than 5.7 you are NOT diabetic.  Targets for Glucose Readings: Time of Check Target for patients WITHOUT Diabetes Target for DIABETICS  Before Meals Less than 100  less than 150  Two hours after meals Less than 200  Less than 250    We want weight loss that will last so you should lose 1-2 pounds a week.  THAT IS IT! Please pick THREE things a month to change. Once it is a habit check off the item. Then pick another three items off the list to become habits.  If you are already doing a habit on the list GREAT!  Cross that item off! o Don't drink your calories. Ie, alcohol, soda, fruit juice, and sweet tea.  o Drink more water. Drink a glass when you feel hungry or before each meal.  o Eat breakfast - Complex carb and protein (likeDannon light and fit yogurt, oatmeal, fruit, eggs, [place] bacon). o Measure your cereal.  Eat no more than one cup a day. (ie [place]) o Eat an apple a day. o Add a vegetable a day. o Try a new vegetable a month. o Use Pam! Stop using oil or butter to cook. o Don't finish your plate or use smaller plates. o Share your dessert. o Eat sugar free Jello for dessert or frozen grapes. o Don't eat 2-3 hours before bed. o Switch to whole wheat bread, pasta, and [place] rice. o Make healthier choices when you eat out. No fries! o Pick baked chicken, NOT fried. o Don't forget to SLOW DOWN when you eat. It is not going anywhere.  o Take the stairs. o Park  far away in the parking lot o [employer] (or weights) for 10 minutes while watching TV. o Walk at work for 10 minutes during break. o Walk outside 1 time a week with your friend, kids, dog, or significant other. o Start a walking group at [place] the mall as much as you can tolerate.  o Keep a food diary. o Weigh yourself daily. o Walk for 15 minutes 3 days per week. o Cook at home more often and eat out less.  If life happens and you go back to old habits, it is okay.  Just start over. You can do it!   If you experience chest pain, get short of breath, or tired during  the exercise, please stop immediately and inform your doctor.      Bad carbs also include fruit juice, alcohol, and sweet tea. These are empty calories that do not signal to your brain that you are full.   Please remember the good carbs are still carbs which convert into sugar. So please measure them out no more than 1/2-1 cup of rice, oatmeal, pasta, and beans  Veggies are however free foods! Pile them on.   Not all fruit is created equal. Please see the list below, the fruit at the bottom is higher in sugars than the fruit at the top. Please avoid all dried fruits.

## 2014-10-11 NOTE — Progress Notes (Signed)
Assessment and Plan:  1. Hypertension -Continue medication, monitor blood pressure at home. Continue DASH diet.  Reminder to go to the ER if any CP, SOB, nausea, dizziness, severe HA, changes vision/speech, left arm numbness and tingling and jaw pain.  2. Cholesterol -Continue diet and exercise. Check cholesterol.   3. Diabetes without complications -Continue diet and exercise. Check A1C Continue farxiga for now but if continues with yeast infections will stop it.  Some Ziac and start Enalapril 20mg  1/2-1 pill daily Stop sweet tea Add bASA  4. Vitamin D Def - check level and continue medications.   5. Yeast infection Diflucan 150 with 3 refills, may need to stop the farxiga Follow up if not better   Continue diet and meds as discussed. Further disposition pending results of labs. Over 30 minutes of exam, counseling, chart review, and critical decision making was performed  HPI 59 y.o. female  presents for 3 month follow up on hypertension, cholesterol, prediabetes, and vitamin D deficiency.   Her blood pressure has been controlled at home, today their BP is BP: 110/70 mmHg  She does not workout. She denies chest pain, shortness of breath, dizziness.  She is on cholesterol medication, crestor 1/2 QOD and denies myalgias. Her cholesterol is at goal. The cholesterol last visit was:   Lab Results  Component Value Date   CHOL 139 03/08/2014   HDL 50 03/08/2014   LDLCALC 66 03/08/2014   TRIG 113 03/08/2014   CHOLHDL 2.8 03/08/2014    She has been working on diet and exercise for diabetes without complications, she is on farxiga, can not tolerate metformin, she is not on ACE or bASA, and denies paresthesia of the feet, polydipsia, polyuria and visual disturbances. Last A1C in the office was:  Lab Results  Component Value Date   HGBA1C 6.8* 07/12/2014   Has a history of low potassium Lab Results  Component Value Date   CREATININE 0.68 07/20/2014   BUN 14 07/20/2014   NA 140  07/20/2014   K 4.2 07/20/2014   CL 106 07/20/2014   CO2 27 07/20/2014   Patient is on Vitamin D supplement.   Lab Results  Component Value Date   VD25OH 36 12/26/2013     She has anxiety/depression, is on xanax PRN and lexapro.  She has ADD and is on Vyvanse.  She was found to have adenomatous rectal polyp with Dr. Hilarie Fredrickson, has partial prioctectomy with Dr. Johney Maine on 07/19/2014 but states no cancer. Will get colonoscopy in 3 years.  She has been having yeast infections since after the surgery.   BMI is Body mass index is 28.91 kg/(m^2)., she is working on diet and exercise. Wt Readings from Last 3 Encounters:  10/11/14 171 lb (77.565 kg)  07/19/14 166 lb 8 oz (75.524 kg)  07/12/14 166 lb 8 oz (75.524 kg)    Current Medications:  Current Outpatient Prescriptions on File Prior to Visit  Medication Sig Dispense Refill  . ALPRAZolam (XANAX) 0.5 MG tablet Take 1 tablet (0.5 mg total) by mouth 2 (two) times daily as needed. 60 tablet 0  . bisoprolol-hydrochlorothiazide (ZIAC) 10-6.25 MG per tablet TAKE 1 TABLET BY MOUTH EVERY DAY FOR BLOOD PRESSURE 90 tablet 0  . CRESTOR 20 MG tablet Take 1 tablet by mouth every other day.    . escitalopram (LEXAPRO) 20 MG tablet TAKE 1 TABLET BY MOUTH DAILY. 90 tablet 0  . FARXIGA 10 MG TABS tablet TAKE 1 TABLET BY MOUTH DAILY 90 tablet 0  .  glucose blood (FREESTYLE LITE) test strip Use as instructed 100 each 12  . ibuprofen (ADVIL,MOTRIN) 800 MG tablet Take 1 tablet (800 mg total) by mouth every 8 (eight) hours as needed. 90 tablet 1  . levocetirizine (XYZAL) 5 MG tablet TAKE 1 TABLET EVERY DAY 90 tablet 3  . lisdexamfetamine (VYVANSE) 40 MG capsule Take 1 capsule (40 mg total) by mouth daily. 90 capsule 0  . mometasone (NASONEX) 50 MCG/ACT nasal spray Place 2 sprays into the nose daily. 17 g 0  . oxyCODONE (OXY IR/ROXICODONE) 5 MG immediate release tablet Take 1-2 tablets (5-10 mg total) by mouth every 4 (four) hours as needed for moderate pain, severe  pain or breakthrough pain. 40 tablet 0   No current facility-administered medications on file prior to visit.   Medical History:  Past Medical History  Diagnosis Date  . Hypertension   . ADD (attention deficit disorder)   . Depression   . IBS (irritable bowel syndrome)   . Hyperlipidemia 2004  . Diabetes mellitus without complication 2330  . Asthma     with severe allergies pt does use albuterol inhaler when needed  . Anxiety   . GERD (gastroesophageal reflux disease)     hx of had Nissen Fundoplication 0762 has not had any issues since  . History of hiatal hernia    Allergies:  Allergies  Allergen Reactions  . Tape Rash  . Metformin And Related     myalgia  . Morphine And Related Itching  . Penicillins     Pt states that in 3rd grade she was given an injection in the buttock and reacted with a large hive in that area  . Wellbutrin [Bupropion]      Review of Systems:  Review of Systems  Constitutional: Negative.   HENT: Negative.   Eyes: Negative.   Respiratory: Negative.   Cardiovascular: Negative.   Gastrointestinal: Negative.   Genitourinary: Negative.        + thick white discharge and vaginal itching  Musculoskeletal: Negative.   Skin: Negative.   Neurological: Negative.   Endo/Heme/Allergies: Negative.   Psychiatric/Behavioral: Negative.     Family history- Review and unchanged Social history- Review and unchanged Physical Exam: BP 110/70 mmHg  Pulse 60  Temp(Src) 97.7 F (36.5 C)  Resp 16  Ht 5' 4.5" (1.638 m)  Wt 171 lb (77.565 kg)  BMI 28.91 kg/m2 Wt Readings from Last 3 Encounters:  10/11/14 171 lb (77.565 kg)  07/19/14 166 lb 8 oz (75.524 kg)  07/12/14 166 lb 8 oz (75.524 kg)   General Appearance: Well nourished, in no apparent distress. Eyes: PERRLA, EOMs, conjunctiva no swelling or erythema Sinuses: No Frontal/maxillary tenderness ENT/Mouth: Ext aud canals clear, TMs without erythema, bulging. No erythema, swelling, or exudate on post  pharynx.  Tonsils not swollen or erythematous. Hearing normal.  Neck: Supple, thyroid normal.  Respiratory: Respiratory effort normal, BS equal bilaterally without rales, rhonchi, wheezing or stridor.  Cardio: RRR with no MRGs. Brisk peripheral pulses without edema.  Abdomen: Soft, + BS,  Non tender, no guarding, rebound, hernias, masses. Lymphatics: Non tender without lymphadenopathy.  Musculoskeletal: Full ROM, 5/5 strength, Normal gait Skin: Warm, dry without rashes, lesions, ecchymosis.  Neuro: Cranial nerves intact. Normal muscle tone, no cerebellar symptoms. Psych: Awake and oriented X 3, normal affect, Insight and Judgment appropriate.    Vicie Mutters, PA-C 10:52 AM [facility]

## 2014-10-12 LAB — LIPID PANEL
CHOLESTEROL: 187 mg/dL (ref 0–200)
HDL: 58 mg/dL (ref >=46)
LDL CALC: 108 mg/dL — AB (ref 0–99)
TRIGLYCERIDES: 104 mg/dL (ref <150)
Total CHOL/HDL Ratio: 3.2 Ratio
VLDL: 21 mg/dL (ref 0–40)

## 2014-10-12 LAB — HEPATIC FUNCTION PANEL
ALK PHOS: 101 U/L (ref 39–117)
ALT: 21 U/L (ref 0–35)
AST: 15 U/L (ref 0–37)
Albumin: 4 g/dL (ref 3.5–5.2)
BILIRUBIN INDIRECT: 0.5 mg/dL (ref 0.2–1.2)
Bilirubin, Direct: 0.1 mg/dL (ref 0.0–0.3)
Total Bilirubin: 0.6 mg/dL (ref 0.2–1.2)
Total Protein: 6.8 g/dL (ref 6.0–8.3)

## 2014-10-12 LAB — INSULIN, FASTING: Insulin fasting, serum: 14.9 u[IU]/mL (ref 2.0–19.6)

## 2014-10-12 LAB — MAGNESIUM: Magnesium: 2.1 mg/dL (ref 1.5–2.5)

## 2014-10-12 LAB — BASIC METABOLIC PANEL WITH GFR
BUN: 18 mg/dL (ref 6–23)
CO2: 28 meq/L (ref 19–32)
CREATININE: 0.66 mg/dL (ref 0.50–1.10)
Calcium: 9.6 mg/dL (ref 8.4–10.5)
Chloride: 104 mEq/L (ref 96–112)
GFR, Est African American: >89 mL/min
GFR, Est Non African American: >89 mL/min
Glucose, Bld: 117 mg/dL — ABNORMAL HIGH (ref 70–99)
POTASSIUM: 3.9 meq/L (ref 3.5–5.3)
Sodium: 139 mEq/L (ref 135–145)

## 2014-10-12 LAB — VITAMIN D 25 HYDROXY (VIT D DEFICIENCY, FRACTURES): Vit D, 25-Hydroxy: 29 ng/mL — ABNORMAL LOW (ref 30–100)

## 2014-10-12 LAB — TSH: TSH: 3.301 u[IU]/mL (ref 0.350–4.500)

## 2014-10-31 ENCOUNTER — Other Ambulatory Visit: Payer: Self-pay | Admitting: Physician Assistant

## 2015-01-06 ENCOUNTER — Other Ambulatory Visit: Payer: Self-pay | Admitting: Internal Medicine

## 2015-01-10 ENCOUNTER — Encounter: Payer: Self-pay | Admitting: Emergency Medicine

## 2015-01-17 ENCOUNTER — Ambulatory Visit (INDEPENDENT_AMBULATORY_CARE_PROVIDER_SITE_OTHER): Payer: 59 | Admitting: Physician Assistant

## 2015-01-17 ENCOUNTER — Other Ambulatory Visit: Payer: Self-pay | Admitting: *Deleted

## 2015-01-17 ENCOUNTER — Encounter: Payer: Self-pay | Admitting: Physician Assistant

## 2015-01-17 VITALS — BP 120/80 | HR 96 | Temp 97.5°F | Resp 16 | Wt 182.0 lb

## 2015-01-17 DIAGNOSIS — E669 Obesity, unspecified: Secondary | ICD-10-CM | POA: Diagnosis not present

## 2015-01-17 DIAGNOSIS — G4733 Obstructive sleep apnea (adult) (pediatric): Secondary | ICD-10-CM | POA: Diagnosis not present

## 2015-01-17 DIAGNOSIS — E785 Hyperlipidemia, unspecified: Secondary | ICD-10-CM

## 2015-01-17 DIAGNOSIS — Z79899 Other long term (current) drug therapy: Secondary | ICD-10-CM

## 2015-01-17 DIAGNOSIS — E559 Vitamin D deficiency, unspecified: Secondary | ICD-10-CM | POA: Diagnosis not present

## 2015-01-17 DIAGNOSIS — I10 Essential (primary) hypertension: Secondary | ICD-10-CM | POA: Diagnosis not present

## 2015-01-17 DIAGNOSIS — E119 Type 2 diabetes mellitus without complications: Secondary | ICD-10-CM | POA: Diagnosis not present

## 2015-01-17 DIAGNOSIS — B373 Candidiasis of vulva and vagina: Secondary | ICD-10-CM | POA: Diagnosis not present

## 2015-01-17 DIAGNOSIS — Z23 Encounter for immunization: Secondary | ICD-10-CM | POA: Diagnosis not present

## 2015-01-17 DIAGNOSIS — B3731 Acute candidiasis of vulva and vagina: Secondary | ICD-10-CM

## 2015-01-17 LAB — CBC WITH DIFFERENTIAL/PLATELET
BASOS ABS: 0.1 10*3/uL (ref 0.0–0.1)
BASOS PCT: 2 % — AB (ref 0–1)
EOS ABS: 0.3 10*3/uL (ref 0.0–0.7)
Eosinophils Relative: 4 % (ref 0–5)
HCT: 44.8 % (ref 36.0–46.0)
HEMOGLOBIN: 15 g/dL (ref 12.0–15.0)
Lymphocytes Relative: 38 % (ref 12–46)
Lymphs Abs: 2.8 10*3/uL (ref 0.7–4.0)
MCH: 28.1 pg (ref 26.0–34.0)
MCHC: 33.5 g/dL (ref 30.0–36.0)
MCV: 84.1 fL (ref 78.0–100.0)
MPV: 10.2 fL (ref 8.6–12.4)
Monocytes Absolute: 0.5 10*3/uL (ref 0.1–1.0)
Monocytes Relative: 7 % (ref 3–12)
NEUTROS ABS: 3.6 10*3/uL (ref 1.7–7.7)
NEUTROS PCT: 49 % (ref 43–77)
PLATELETS: 316 10*3/uL (ref 150–400)
RBC: 5.33 MIL/uL — AB (ref 3.87–5.11)
RDW: 13.7 % (ref 11.5–15.5)
WBC: 7.4 10*3/uL (ref 4.0–10.5)

## 2015-01-17 LAB — HEPATIC FUNCTION PANEL
ALBUMIN: 4 g/dL (ref 3.6–5.1)
ALK PHOS: 113 U/L (ref 33–130)
ALT: 17 U/L (ref 6–29)
AST: 19 U/L (ref 10–35)
Bilirubin, Direct: 0.1 mg/dL (ref <=0.2)
Indirect Bilirubin: 0.4 mg/dL (ref 0.2–1.2)
TOTAL PROTEIN: 6.8 g/dL (ref 6.1–8.1)
Total Bilirubin: 0.5 mg/dL (ref 0.2–1.2)

## 2015-01-17 LAB — BASIC METABOLIC PANEL WITH GFR
BUN: 17 mg/dL (ref 7–25)
CHLORIDE: 105 mmol/L (ref 98–110)
CO2: 22 mmol/L (ref 20–31)
CREATININE: 0.63 mg/dL (ref 0.50–1.05)
Calcium: 9.6 mg/dL (ref 8.6–10.4)
GLUCOSE: 142 mg/dL — AB (ref 65–99)
Potassium: 4 mmol/L (ref 3.5–5.3)
SODIUM: 139 mmol/L (ref 135–146)

## 2015-01-17 LAB — LIPID PANEL
Cholesterol: 198 mg/dL (ref 125–200)
HDL: 51 mg/dL (ref >=46)
LDL CALC: 113 mg/dL (ref <130)
TRIGLYCERIDES: 171 mg/dL — AB (ref <150)
Total CHOL/HDL Ratio: 3.9 Ratio (ref <=5.0)
VLDL: 34 mg/dL — ABNORMAL HIGH (ref <30)

## 2015-01-17 LAB — TSH: TSH: 3.087 u[IU]/mL (ref 0.350–4.500)

## 2015-01-17 LAB — MAGNESIUM: MAGNESIUM: 2.1 mg/dL (ref 1.5–2.5)

## 2015-01-17 MED ORDER — LOSARTAN POTASSIUM 100 MG PO TABS
ORAL_TABLET | ORAL | Status: DC
Start: 2015-01-17 — End: 2015-05-28

## 2015-01-17 MED ORDER — DULAGLUTIDE 1.5 MG/0.5ML ~~LOC~~ SOAJ: SUBCUTANEOUS | Status: DC

## 2015-01-17 MED ORDER — LISDEXAMFETAMINE DIMESYLATE 40 MG PO CAPS
40.0000 mg | ORAL_CAPSULE | Freq: Every day | ORAL | Status: DC
Start: 2015-01-17 — End: 2015-04-22

## 2015-01-17 MED ORDER — FLUCONAZOLE 150 MG PO TABS: 150.0000 mg | ORAL_TABLET | Freq: Every day | ORAL | Status: DC

## 2015-01-17 NOTE — Patient Instructions (Addendum)
Add ENTERIC COATED low dose 81 mg Aspirin daily OR can do every other day if you have easy bruising to protect your heart and head. As well as to reduce risk of Colon Cancer by 20 %, Skin Cancer by 26 % , Melanoma by 46% and Pancreatic cancer by 60%  Stop the enalapril, start losartan 1/2 pill daily for blood pressure and kidney protection, if BP above 140/90 increase to 1 pill  Stop the farxiga and start the once a week injectable, eat smaller meals to avoid nausea,  Call if you have issues.   Diabetes is a very complicated disease...lets simplify it.  An easy way to look at it to understand the complications is if you think of the extra sugar floating in your blood stream as glass shards floating through your blood stream.    Diabetes affects your small vessels first: 1) The glass shards (sugar) scraps down the tiny blood vessels in your eyes and lead to diabetic retinopathy, the leading cause of blindness in the [place]. Diabetes is the leading cause of newly diagnosed adult (26 to 58 years of age) blindness in the [place].  2) The glass shards scratches down the tiny vessels of your legs leading to nerve damage called neuropathy and can lead to amputations of your feet. More than 60% of all non-traumatic amputations of lower limbs occur in people with diabetes.  3) Over time the small vessels in your brain are shredded and closed off, individually this does not cause any problems but over a long period of time many of the small vessels being blocked can lead to Vascular Dementia.   4) Your kidney's are a filter system and have a "net" that keeps certain things in the body and lets bad things out. Sugar shreds this net and leads to kidney damage and eventually failure. Decreasing the sugar that is destroying the net and certain blood pressure medications can help stop or decrease progression of kidney disease. Diabetes was the primary cause of kidney failure in 44 percent of all new cases in  2011.  5) Diabetes also destroys the small vessels in your penis that lead to erectile dysfunction. Eventually the vessels are so damaged that you may not be responsive to cialis or viagra.   Diabetes and your large vessels: Your larger vessels consist of your coronary arteries in your heart and the carotid vessels to your brain. Diabetes or even increased sugars put you at 300% increased risk of heart attack and stroke and this is why.. The sugar scrapes down your large blood vessels and your body sees this as an internal injury and tries to repair itself. Just like you get a scab on your skin, your platelets will stick to the blood vessel wall trying to heal it. This is why we have diabetics on low dose aspirin daily, this prevents the platelets from sticking and can prevent plaque formation. In addition, your body takes cholesterol and tries to shove it into the open wound. This is why we want your LDL, or bad cholesterol, below 70.   The combination of platelets and cholesterol over 5-10 years forms plaque that can break off and cause a heart attack or stroke.   PLEASE REMEMBER:  Diabetes is preventable! Up to 48 percent of complications and morbidities among individuals with type 2 diabetes can be prevented, delayed, or effectively treated and minimized with regular visits to a health professional, appropriate monitoring and medication, and a healthy diet and lifestyle.  Bad carbs also include fruit juice, alcohol, and sweet tea. These are empty calories that do not signal to your brain that you are full.   Please remember the good carbs are still carbs which convert into sugar. So please measure them out no more than 1/2-1 cup of rice, oatmeal, pasta, and beans  Veggies are however free foods! Pile them on.   Not all fruit is created equal. Please see the list below, the fruit at the bottom is higher in sugars than the fruit at the top. Please avoid all dried fruits.     We want  weight loss that will last so you should lose 1-2 pounds a week.  THAT IS IT! Please pick THREE things a month to change. Once it is a habit check off the item. Then pick another three items off the list to become habits.  If you are already doing a habit on the list GREAT!  Cross that item off! o Don't drink your calories. Ie, alcohol, soda, fruit juice, and sweet tea.  o Drink more water. Drink a glass when you feel hungry or before each meal.  o Eat breakfast - Complex carb and protein (likeDannon light and fit yogurt, oatmeal, fruit, eggs, [place] bacon). o Measure your cereal.  Eat no more than one cup a day. (ie [place]) o Eat an apple a day. o Add a vegetable a day. o Try a new vegetable a month. o Use Pam! Stop using oil or butter to cook. o Don't finish your plate or use smaller plates. o Share your dessert. o Eat sugar free Jello for dessert or frozen grapes. o Don't eat 2-3 hours before bed. o Switch to whole wheat bread, pasta, and [place] rice. o Make healthier choices when you eat out. No fries! o Pick baked chicken, NOT fried. o Don't forget to SLOW DOWN when you eat. It is not going anywhere.  o Take the stairs. o Park far away in the parking lot o [employer] (or weights) for 10 minutes while watching TV. o Walk at work for 10 minutes during break. o Walk outside 1 time a week with your friend, kids, dog, or significant other. o Start a walking group at [place] the mall as much as you can tolerate.  o Keep a food diary. o Weigh yourself daily. o Walk for 15 minutes 3 days per week. o Cook at home more often and eat out less.  If life happens and you go back to old habits, it is okay.  Just start over. You can do it!   If you experience chest pain, get short of breath, or tired during the exercise, please stop immediately and inform your doctor.

## 2015-01-17 NOTE — Progress Notes (Signed)
Assessment and Plan:  1. Hypertension -STOP ACE, START ARB monitor blood pressure at home. Continue DASH diet.  Reminder to go to the ER if any CP, SOB, nausea, dizziness, severe HA, changes vision/speech, left arm numbness and tingling and jaw pain.  2. Cholesterol -Continue diet and exercise. Check cholesterol.   3. Diabetes without complications -Continue diet and exercise. Check A1C Stop farxiga, intolerant to metformin, given trulicty samples and RX Stop ACE due to cough, will switch to ARB Losartan 100 sent in Stop sweet tea Add bASA FOLLOW UP ONE MONTH  4. Vitamin D Def - check level and continue medications.   5. Probable sleep apnea - get sleep study, weight loss advised.   6. Obesity with co morbidities - long discussion about weight loss, diet, and exercise    Continue diet and meds as discussed. Further disposition pending results of labs. Over 30 minutes of exam, counseling, chart review, and critical decision making was performed  HPI 59 y.o. female  presents for 3 month follow up on hypertension, cholesterol, prediabetes, and vitamin D deficiency.   Her blood pressure has been controlled at home, last visit she was put on ACE due to DM but states she is having some palpitations due to being off ziac, she has had dry cough and tickle in her throat, today their BP is BP: 120/80 mmHg  She does workout, has started walking with husky/lab mix. She denies chest pain, shortness of breath, dizziness.  She is on cholesterol medication, crestor 1/2 QOD and denies myalgias. Her cholesterol is not at goal. The cholesterol last visit was:   Lab Results  Component Value Date   CHOL 187 10/11/2014   HDL 58 10/11/2014   LDLCALC 108* 10/11/2014   TRIG 104 10/11/2014   CHOLHDL 3.2 10/11/2014    She has been working on diet and exercise for diabetes without complications, she is on farxiga but has continued yeast infections so we will stop it, can not tolerate metformin, she is  on ACE, not on bASA, and denies paresthesia of the feet, polydipsia, polyuria and visual disturbances. Last A1C in the office was:  Lab Results  Component Value Date   HGBA1C 7.0* 10/11/2014   Has a history of low potassium Lab Results  Component Value Date   CREATININE 0.66 10/11/2014   BUN 18 10/11/2014   NA 139 10/11/2014   K 3.9 10/11/2014   CL 104 10/11/2014   CO2 28 10/11/2014   Patient is on Vitamin D supplement.   Lab Results  Component Value Date   VD25OH 29* 10/11/2014     She has anxiety/depression, is on xanax PRN and lexapro.  She has ADD and is on Vyvanse, but has not been taking it. She states she is very tried during the day, but wide awake at night. She sleeps better in the recliner,snores, obese.   She was found to have adenomatous rectal polyp with Dr. Hilarie Fredrickson, has partial prioctectomy with Dr. Johney Maine on 07/19/2014 but states no cancer. Will get colonoscopy in 3 years.  She has been having yeast infections since after the surgery.   BMI is Body mass index is 30.77 kg/(m^2)., she is working on diet and exercise. Wt Readings from Last 3 Encounters:  01/17/15 182 lb (82.555 kg)  10/11/14 171 lb (77.565 kg)  07/19/14 166 lb 8 oz (75.524 kg)    Current Medications:  Current Outpatient Prescriptions on File Prior to Visit  Medication Sig Dispense Refill  . ALPRAZolam (XANAX) 0.5  MG tablet Take 1 tablet (0.5 mg total) by mouth 2 (two) times daily as needed. 60 tablet 0  . dapagliflozin propanediol (FARXIGA) 10 MG TABS tablet Take 10 mg by mouth daily. 90 tablet 1  . enalapril (VASOTEC) 20 MG tablet 1/2-1 pill a day for blood pressure/kidney protection 30 tablet 3  . escitalopram (LEXAPRO) 20 MG tablet TAKE 1 TABLET BY MOUTH DAILY. 90 tablet 0  . fluconazole (DIFLUCAN) 150 MG tablet Take 1 tablet (150 mg total) by mouth daily. 1 tablet 3  . glucose blood (FREESTYLE LITE) test strip Use as instructed 100 each 12  . ibuprofen (ADVIL,MOTRIN) 800 MG tablet Take 1 tablet  (800 mg total) by mouth every 8 (eight) hours as needed. 90 tablet 1  . levocetirizine (XYZAL) 5 MG tablet TAKE 1 TABLET EVERY DAY 90 tablet 3  . lisdexamfetamine (VYVANSE) 40 MG capsule Take 1 capsule (40 mg total) by mouth daily. 90 capsule 0  . mometasone (NASONEX) 50 MCG/ACT nasal spray Place 2 sprays into the nose daily. 17 g 0  . oxyCODONE (OXY IR/ROXICODONE) 5 MG immediate release tablet Take 1-2 tablets (5-10 mg total) by mouth every 4 (four) hours as needed for moderate pain, severe pain or breakthrough pain. 40 tablet 0  . rosuvastatin (CRESTOR) 20 MG tablet Take 1 tablet (20 mg total) by mouth every other day. 90 tablet 1   No current facility-administered medications on file prior to visit.   Medical History:  Past Medical History  Diagnosis Date  . Hypertension   . ADD (attention deficit disorder)   . Depression   . IBS (irritable bowel syndrome)   . Hyperlipidemia 2004  . Diabetes mellitus without complication ([place]) 4562  . Asthma     with severe allergies pt does use albuterol inhaler when needed  . Anxiety   . GERD (gastroesophageal reflux disease)     hx of had Nissen Fundoplication 5638 has not had any issues since  . History of hiatal hernia    Allergies:  Allergies  Allergen Reactions  . Tape Rash  . Metformin And Related     myalgia  . Morphine And Related Itching  . Penicillins     Pt states that in 3rd grade she was given an injection in the buttock and reacted with a large hive in that area  . Wellbutrin [Bupropion]      Review of Systems:  Review of Systems  Constitutional: Negative.   HENT: Negative.   Eyes: Negative.   Respiratory: Negative.   Cardiovascular: Negative.   Gastrointestinal: Negative.   Genitourinary: Negative.        + thick white discharge and vaginal itching  Musculoskeletal: Negative.   Skin: Negative.   Neurological: Negative.   Endo/Heme/Allergies: Negative.   Psychiatric/Behavioral: Negative.     Family history-  Review and unchanged Social history- Review and unchanged Physical Exam: BP 120/80 mmHg  Pulse 96  Temp(Src) 97.5 F (36.4 C) (Temporal)  Resp 16  Wt 182 lb (82.555 kg)  SpO2 96% Wt Readings from Last 3 Encounters:  01/17/15 182 lb (82.555 kg)  10/11/14 171 lb (77.565 kg)  07/19/14 166 lb 8 oz (75.524 kg)   General Appearance: Well nourished, in no apparent distress. Eyes: PERRLA, EOMs, conjunctiva no swelling or erythema Sinuses: No Frontal/maxillary tenderness ENT/Mouth: Ext aud canals clear, TMs without erythema, bulging. No erythema, swelling, or exudate on post pharynx.  Tonsils not swollen or erythematous. Hearing normal.  Neck: Supple, thyroid normal.  Respiratory: Respiratory  effort normal, BS equal bilaterally without rales, rhonchi, wheezing or stridor.  Cardio: RRR with no MRGs. Brisk peripheral pulses without edema.  Abdomen: Soft, + BS,  Non tender, no guarding, rebound, hernias, masses. Lymphatics: Non tender without lymphadenopathy.  Musculoskeletal: Full ROM, 5/5 strength, Normal gait Skin: Warm, dry without rashes, lesions, ecchymosis.  Neuro: Cranial nerves intact. Normal muscle tone, no cerebellar symptoms. Psych: Awake and oriented X 3, normal affect, Insight and Judgment appropriate.    Vicie Mutters, PA-C 11:40 AM [facility]

## 2015-01-18 ENCOUNTER — Ambulatory Visit: Payer: Self-pay | Admitting: Physician Assistant

## 2015-01-18 LAB — HEMOGLOBIN A1C
HEMOGLOBIN A1C: 7.2 % — AB (ref <5.7)
Mean Plasma Glucose: 160 mg/dL — ABNORMAL HIGH (ref <117)

## 2015-01-18 LAB — VITAMIN D 25 HYDROXY (VIT D DEFICIENCY, FRACTURES): VIT D 25 HYDROXY: 23 ng/mL — AB (ref 30–100)

## 2015-02-18 ENCOUNTER — Ambulatory Visit (INDEPENDENT_AMBULATORY_CARE_PROVIDER_SITE_OTHER): Payer: 59 | Admitting: Physician Assistant

## 2015-02-18 ENCOUNTER — Encounter: Payer: Self-pay | Admitting: Physician Assistant

## 2015-02-18 VITALS — BP 140/70 | HR 100 | Temp 97.9°F | Resp 14 | Ht 64.5 in | Wt 179.0 lb

## 2015-02-18 DIAGNOSIS — I10 Essential (primary) hypertension: Secondary | ICD-10-CM | POA: Diagnosis not present

## 2015-02-18 DIAGNOSIS — G4733 Obstructive sleep apnea (adult) (pediatric): Secondary | ICD-10-CM | POA: Diagnosis not present

## 2015-02-18 DIAGNOSIS — E669 Obesity, unspecified: Secondary | ICD-10-CM | POA: Diagnosis not present

## 2015-02-18 DIAGNOSIS — E119 Type 2 diabetes mellitus without complications: Secondary | ICD-10-CM

## 2015-02-18 NOTE — Patient Instructions (Signed)
Diabetes is a very complicated disease...lets simplify it.  An easy way to look at it to understand the complications is if you think of the extra sugar floating in your blood stream as glass shards floating through your blood stream.    Diabetes affects your small vessels first: 1) The glass shards (sugar) scraps down the tiny blood vessels in your eyes and lead to diabetic retinopathy, the leading cause of blindness in the [place]. Diabetes is the leading cause of newly diagnosed adult (20 to 58 years of age) blindness in the [place].  2) The glass shards scratches down the tiny vessels of your legs leading to nerve damage called neuropathy and can lead to amputations of your feet. More than 60% of all non-traumatic amputations of lower limbs occur in people with diabetes.  3) Over time the small vessels in your brain are shredded and closed off, individually this does not cause any problems but over a long period of time many of the small vessels being blocked can lead to Vascular Dementia.   4) Your kidney's are a filter system and have a "net" that keeps certain things in the body and lets bad things out. Sugar shreds this net and leads to kidney damage and eventually failure. Decreasing the sugar that is destroying the net and certain blood pressure medications can help stop or decrease progression of kidney disease. Diabetes was the primary cause of kidney failure in 44 percent of all new cases in 2011.  5) Diabetes also destroys the small vessels in your penis that lead to erectile dysfunction. Eventually the vessels are so damaged that you may not be responsive to cialis or viagra.   Diabetes and your large vessels: Your larger vessels consist of your coronary arteries in your heart and the carotid vessels to your brain. Diabetes or even increased sugars put you at 300% increased risk of heart attack and stroke and this is why.. The sugar scrapes down your large blood vessels and your body  sees this as an internal injury and tries to repair itself. Just like you get a scab on your skin, your platelets will stick to the blood vessel wall trying to heal it. This is why we have diabetics on low dose aspirin daily, this prevents the platelets from sticking and can prevent plaque formation. In addition, your body takes cholesterol and tries to shove it into the open wound. This is why we want your LDL, or bad cholesterol, below 70.   The combination of platelets and cholesterol over 5-10 years forms plaque that can break off and cause a heart attack or stroke.   PLEASE REMEMBER:  Diabetes is preventable! Up to 85 percent of complications and morbidities among individuals with type 2 diabetes can be prevented, delayed, or effectively treated and minimized with regular visits to a health professional, appropriate monitoring and medication, and a healthy diet and lifestyle.  Recommendations For Diabetic/Prediabetic Patients:   -  Take medications as prescribed  -  Recommend Dr Joel Fuhrman's book "The End of Diabetes "  And "The End of Dieting"- Can get at  www.Amazon.com and encourage also get the Audio CD book  - AVOID Animal products, ie. Meat - red/white, Poultry and Dairy/especially cheese - Exercise at least 5 times a week for 30 minutes or preferably daily.  - No Smoking - Drink less than 2 drinks a day.  - Monitor your feet for sores - Have yearly Eye Exams - Recommend annual Flu vaccine  -   Recommend Pneumovax and Prevnar vaccines - Shingles Vaccine (Zostavax) if over 60 y.o.  Goals:   - BMI less than 24 - Fasting sugar less than 130 or less than 150 if tapering medicines to lose weight  - Systolic BP less than 130  - Diastolic BP less than 80 - Bad LDL Cholesterol less than 70 - Triglycerides less than 150     Bad carbs also include fruit juice, alcohol, and sweet tea. These are empty calories that do not signal to your brain that you are full.   Please remember the  good carbs are still carbs which convert into sugar. So please measure them out no more than 1/2-1 cup of rice, oatmeal, pasta, and beans  Veggies are however free foods! Pile them on.   Not all fruit is created equal. Please see the list below, the fruit at the bottom is higher in sugars than the fruit at the top. Please avoid all dried fruits.     

## 2015-02-18 NOTE — Progress Notes (Signed)
Assessment and Plan: DM- continue ARB, ASA, continue trulicity for now- recheck A1C 2 months HTN- - continue medications, DASH diet, exercise and monitor at home. Call if greater than 130/80.  Cough- improved off ACE Sleep apnea- follow up for sleep study  Future Appointments Date Time Provider [place]  04/22/2015 11:00 AM Vicie Mutters, PA-C [id number] None    HPI 59 y.o.female presents for 1 month follow up. She was switched from ACE to ARB due to cough and states she is doing better. She is slowly stopping sweet tea, she has stopped farxiga and metformin, given trulicity samples, has been doing it without side effects. She is back on her vyvanse. She has added an ASA. BP: 140/70 mmHg  Wt Readings from Last 3 Encounters:  02/18/15 179 lb (81.194 kg)  01/17/15 182 lb (82.555 kg)  10/11/14 171 lb (77.565 kg)    Past Medical History  Diagnosis Date  . Hypertension   . ADD (attention deficit disorder)   . Depression   . IBS (irritable bowel syndrome)   . Hyperlipidemia 2004  . Diabetes mellitus without complication ([place]) [id number]  . Asthma     with severe allergies pt does use albuterol inhaler when needed  . Anxiety   . GERD (gastroesophageal reflux disease)     hx of had Nissen Fundoplication [id number] has not had any issues since  . History of hiatal hernia      Allergies  Allergen Reactions  . Tape Rash  . Metformin And Related     myalgia  . Morphine And Related Itching  . Penicillins     Pt states that in 3rd grade she was given an injection in the buttock and reacted with a large hive in that area  . Wellbutrin [Bupropion]       Current Outpatient Prescriptions on File Prior to Visit  Medication Sig Dispense Refill  . ALPRAZolam (XANAX) 0.5 MG tablet Take 1 tablet (0.5 mg total) by mouth 2 (two) times daily as needed. 60 tablet 0  . Dulaglutide (TRULICITY) 1.5 0000000 SOPN 1 pen weekly SQ for diabetes 4 pen 5  . escitalopram (LEXAPRO) 20 MG tablet TAKE 1  TABLET BY MOUTH DAILY. 90 tablet 0  . fluconazole (DIFLUCAN) 150 MG tablet Take 1 tablet (150 mg total) by mouth daily. 1 tablet 3  . glucose blood (FREESTYLE LITE) test strip Use as instructed 100 each 12  . ibuprofen (ADVIL,MOTRIN) 800 MG tablet Take 1 tablet (800 mg total) by mouth every 8 (eight) hours as needed. 90 tablet 1  . levocetirizine (XYZAL) 5 MG tablet TAKE 1 TABLET EVERY DAY 90 tablet 3  . lisdexamfetamine (VYVANSE) 40 MG capsule Take 1 capsule (40 mg total) by mouth daily. 90 capsule 0  . losartan (COZAAR) 100 MG tablet 1/2-1 pill daily for blood pressure and kidney protection from DM 30 tablet 4  . mometasone (NASONEX) 50 MCG/ACT nasal spray Place 2 sprays into the nose daily. 17 g 0  . oxyCODONE (OXY IR/ROXICODONE) 5 MG immediate release tablet Take 1-2 tablets (5-10 mg total) by mouth every 4 (four) hours as needed for moderate pain, severe pain or breakthrough pain. 40 tablet 0  . rosuvastatin (CRESTOR) 20 MG tablet Take 1 tablet (20 mg total) by mouth every other day. 90 tablet 1   No current facility-administered medications on file prior to visit.    ROS: all negative except above.   Physical Exam: Filed Weights   02/18/15 1036  Weight: 179 lb (81.194  kg)   BP 140/70 mmHg  Pulse 100  Temp(Src) 97.9 F (36.6 C) (Temporal)  Resp 14  Ht 5' 4.5" (1.638 m)  Wt 179 lb (81.194 kg)  BMI 30.26 kg/m2  SpO2 94% General Appearance: Well nourished, in no apparent distress. Eyes: PERRLA, EOMs, conjunctiva no swelling or erythema Sinuses: No Frontal/maxillary tenderness ENT/Mouth: Ext aud canals clear, TMs without erythema, bulging. No erythema, swelling, or exudate on post pharynx.  Tonsils not swollen or erythematous. Hearing normal.  Neck: Supple, thyroid normal.  Respiratory: Respiratory effort normal, BS equal bilaterally without rales, rhonchi, wheezing or stridor.  Cardio: RRR with no MRGs. Brisk peripheral pulses without edema.  Abdomen: Soft, + BS.  Non tender,  no guarding, rebound, hernias, masses. Lymphatics: Non tender without lymphadenopathy.  Musculoskeletal: Full ROM, 5/5 strength, normal gait.  Skin: Warm, dry without rashes, lesions, ecchymosis.  Neuro: Cranial nerves intact. Normal muscle tone, no cerebellar symptoms. Sensation intact.  Psych: Awake and oriented X 3, normal affect, Insight and Judgment appropriate.     Vicie Mutters, PA-C 11:09 AM [facility]

## 2015-04-16 ENCOUNTER — Other Ambulatory Visit: Payer: Self-pay | Admitting: Physician Assistant

## 2015-04-22 ENCOUNTER — Encounter: Payer: Self-pay | Admitting: Physician Assistant

## 2015-04-22 ENCOUNTER — Ambulatory Visit (INDEPENDENT_AMBULATORY_CARE_PROVIDER_SITE_OTHER): Payer: 59 | Admitting: Physician Assistant

## 2015-04-22 ENCOUNTER — Other Ambulatory Visit: Payer: Self-pay

## 2015-04-22 VITALS — BP 120/88 | HR 100 | Temp 97.3°F | Resp 16 | Ht 64.5 in | Wt 180.0 lb

## 2015-04-22 DIAGNOSIS — E559 Vitamin D deficiency, unspecified: Secondary | ICD-10-CM

## 2015-04-22 DIAGNOSIS — Z79899 Other long term (current) drug therapy: Secondary | ICD-10-CM

## 2015-04-22 DIAGNOSIS — E669 Obesity, unspecified: Secondary | ICD-10-CM

## 2015-04-22 DIAGNOSIS — D128 Benign neoplasm of rectum: Secondary | ICD-10-CM

## 2015-04-22 DIAGNOSIS — Z0000 Encounter for general adult medical examination without abnormal findings: Secondary | ICD-10-CM | POA: Diagnosis not present

## 2015-04-22 DIAGNOSIS — Z23 Encounter for immunization: Secondary | ICD-10-CM | POA: Diagnosis not present

## 2015-04-22 DIAGNOSIS — I10 Essential (primary) hypertension: Secondary | ICD-10-CM

## 2015-04-22 DIAGNOSIS — Z1159 Encounter for screening for other viral diseases: Secondary | ICD-10-CM

## 2015-04-22 DIAGNOSIS — Z0001 Encounter for general adult medical examination with abnormal findings: Secondary | ICD-10-CM

## 2015-04-22 DIAGNOSIS — I8390 Asymptomatic varicose veins of unspecified lower extremity: Secondary | ICD-10-CM

## 2015-04-22 DIAGNOSIS — E785 Hyperlipidemia, unspecified: Secondary | ICD-10-CM

## 2015-04-22 DIAGNOSIS — F988 Other specified behavioral and emotional disorders with onset usually occurring in childhood and adolescence: Secondary | ICD-10-CM

## 2015-04-22 DIAGNOSIS — E119 Type 2 diabetes mellitus without complications: Secondary | ICD-10-CM

## 2015-04-22 LAB — CBC WITH DIFFERENTIAL/PLATELET
BASOS ABS: 0.1 10*3/uL (ref 0.0–0.1)
BASOS PCT: 1 % (ref 0–1)
EOS ABS: 0.2 10*3/uL (ref 0.0–0.7)
Eosinophils Relative: 3 % (ref 0–5)
HCT: 44.6 % (ref 36.0–46.0)
HEMOGLOBIN: 15 g/dL (ref 12.0–15.0)
Lymphocytes Relative: 40 % (ref 12–46)
Lymphs Abs: 2.8 10*3/uL (ref 0.7–4.0)
MCH: 28.4 pg (ref 26.0–34.0)
MCHC: 33.6 g/dL (ref 30.0–36.0)
MCV: 84.3 fL (ref 78.0–100.0)
MONOS PCT: 6 % (ref 3–12)
MPV: 9.8 fL (ref 8.6–12.4)
Monocytes Absolute: 0.4 10*3/uL (ref 0.1–1.0)
NEUTROS ABS: 3.6 10*3/uL (ref 1.7–7.7)
NEUTROS PCT: 50 % (ref 43–77)
PLATELETS: 333 10*3/uL (ref 150–400)
RBC: 5.29 MIL/uL — ABNORMAL HIGH (ref 3.87–5.11)
RDW: 13.4 % (ref 11.5–15.5)
WBC: 7.1 10*3/uL (ref 4.0–10.5)

## 2015-04-22 LAB — HEMOGLOBIN A1C
HEMOGLOBIN A1C: 6.7 % — AB (ref <5.7)
MEAN PLASMA GLUCOSE: 146 mg/dL — AB (ref <117)

## 2015-04-22 MED ORDER — ROSUVASTATIN CALCIUM 20 MG PO TABS: 20.0000 mg | ORAL_TABLET | ORAL | Status: DC

## 2015-04-22 MED ORDER — DULAGLUTIDE 1.5 MG/0.5ML ~~LOC~~ SOAJ: SUBCUTANEOUS | Status: DC

## 2015-04-22 MED ORDER — LISDEXAMFETAMINE DIMESYLATE 40 MG PO CAPS: 40.0000 mg | ORAL_CAPSULE | Freq: Every day | ORAL | Status: DC

## 2015-04-22 NOTE — Progress Notes (Signed)
Complete Physical  Assessment and Plan: 1. Essential hypertension - continue medications, DASH diet, exercise and monitor at home. Call if greater than 130/80.  - CBC with Differential/Platelet - BASIC METABOLIC PANEL WITH GFR - Hepatic function panel - TSH - Urinalysis, Routine w reflex microscopic (not at [facility] - Microalbumin / creatinine urine ratio  2. Diabetes mellitus without complication ([place]) Discussed general issues about diabetes pathophysiology and management., Educational material distributed., Suggested low cholesterol diet., Encouraged aerobic exercise., Discussed foot care., Reminded to get yearly retinal exam. - Hemoglobin A1c - Insulin, fasting - Urinalysis, Routine w reflex microscopic (not at [facility]) - Microalbumin / creatinine urine ratio  3. 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 Hyperlipidemia - Lipid panel  5. Obesity Obesity with co morbidities- long discussion about weight loss, diet, and exercise  6. Vitamin D deficiency - VITAMIN D 25 Hydroxy (Vit-D Deficiency, Fractures)  7. Medication management - Magnesium  8. ADD (attention deficit disorder)  9. Varicose veins  10. Encounter for general adult medical examination with abnormal findings Get MGM - CBC with Differential/Platelet - BASIC METABOLIC PANEL WITH GFR - Hepatic function panel - TSH - Lipid panel - Hemoglobin A1c - Magnesium - Insulin, fasting - VITAMIN D 25 Hydroxy (Vit-D Deficiency, Fractures) - Urinalysis, Routine w reflex microscopic (not at [facility]) - Microalbumin / creatinine urine ratio - HIV antibody - Hepatitis C antibody  11. Screening for viral disease - HIV antibody - Hepatitis C antibody  Discussed med's effects and SE's. Screening labs and tests as requested with regular follow-up as recommended. Over 40 minutes of exam, counseling, chart review, and complex, high level critical decision making was performed this visit.    HPI  60 y.o. female  presents for a complete physical.  Her blood pressure has been controlled at home, today their BP is BP: 120/88 mmHg She does workout. She denies chest pain, shortness of breath, dizziness.  She is on cholesterol medication and denies myalgias. Her cholesterol is not at goal. The cholesterol last visit was:   Lab Results  Component Value Date   CHOL 198 01/17/2015   HDL 51 01/17/2015   LDLCALC 113 01/17/2015   TRIG 171* 01/17/2015   CHOLHDL 3.9 01/17/2015   She has been working on diet and exercise for diabetes without complications, she is on bASA, she is on ACE/ARB, she does not check sugars at home and denies paresthesia of the feet, polydipsia, polyuria and visual disturbances. Last A1C in the office was:  Lab Results  Component Value Date   HGBA1C 7.2* 01/17/2015   Patient is on Vitamin D supplement.   Lab Results  Component Value Date   VD25OH 23* 01/17/2015     She has anxiety/depression, is on xanax PRN and lexapro, mother in law passed in Dec, and had to put in father into assisted living after christmas.  She has ADD and is on Vyvanse, but has not been taking it. She states she is very tried during the day, but wide awake at night. She sleeps better in the recliner,snores, obese.   She was found to have adenomatous rectal polyp with Dr. Hilarie Fredrickson, has partial prioctectomy with Dr. Johney Maine on 07/19/2014 but states no cancer. Will get colonoscopy in 3 years.  She has been having yeast infections since after the surgery.   BMI is Body mass index is 30.43 kg/(m^2)., she is working on diet and exercise. Wt Readings from Last 3 Encounters:  04/22/15 180 lb (  81.647 kg)  02/18/15 179 lb (81.194 kg)  01/17/15 182 lb (82.555 kg)      Current Medications:  Current Outpatient Prescriptions on File Prior to Visit  Medication Sig Dispense Refill  . ALPRAZolam (XANAX) 0.5 MG tablet Take 1 tablet (0.5 mg total) by mouth 2 (two) times daily as needed. 60 tablet 0   . Dulaglutide (TRULICITY) 1.5 0000000 SOPN 1 pen weekly SQ for diabetes 4 pen 5  . escitalopram (LEXAPRO) 20 MG tablet TAKE 1 TABLET BY MOUTH DAILY. 90 tablet 0  . fluconazole (DIFLUCAN) 150 MG tablet Take 1 tablet (150 mg total) by mouth daily. 1 tablet 3  . glucose blood (FREESTYLE LITE) test strip Use as instructed 100 each 12  . ibuprofen (ADVIL,MOTRIN) 800 MG tablet Take 1 tablet (800 mg total) by mouth every 8 (eight) hours as needed. 90 tablet 1  . levocetirizine (XYZAL) 5 MG tablet TAKE 1 TABLET EVERY DAY 90 tablet 3  . lisdexamfetamine (VYVANSE) 40 MG capsule Take 1 capsule (40 mg total) by mouth daily. 90 capsule 0  . losartan (COZAAR) 100 MG tablet 1/2-1 pill daily for blood pressure and kidney protection from DM 30 tablet 4  . oxyCODONE (OXY IR/ROXICODONE) 5 MG immediate release tablet Take 1-2 tablets (5-10 mg total) by mouth every 4 (four) hours as needed for moderate pain, severe pain or breakthrough pain. 40 tablet 0  . rosuvastatin (CRESTOR) 20 MG tablet Take 1 tablet (20 mg total) by mouth every other day. 90 tablet 1  . mometasone (NASONEX) 50 MCG/ACT nasal spray Place 2 sprays into the nose daily. 17 g 0   No current facility-administered medications on file prior to visit.   Health Maintenance:   Immunization History  Administered Date(s) Administered  . Influenza Split 03/08/2014, 01/17/2015  . PPD Test 12/28/2013  . Td 10/21/2005  . Tdap 08/03/2013    Tetanus: 2015 Pneumovax: will get today Prevnar 13:  declines Flu vaccine: 2016 Zostavax: declines No LMP recorded. Patient is postmenopausal. Pap: 12/2013 due 2018 MGM: 09/2013 DUE DEXA: Colonoscopy: 05/2014 due 3 years Stress test 2007 CXR 2009  Patient Care Team: Unk Pinto, MD as PCP - General (Internal Medicine) Richmond Campbell, MD as Consulting Physician (Gastroenterology) Jannette Spanner, MD as Referring Physician (Dermatology) Amy Johnnye Sima, DO (Internal Medicine) Garald Balding, MD as  Consulting Physician (Orthopedic Surgery) Rutherford Guys, MD as Consulting Physician (Ophthalmology) Lelon Perla, MD as Consulting Physician (Cardiology) Michael Boston, MD as Consulting Physician (General Surgery) Jerene Bears, MD as Consulting Physician (Gastroenterology)  Allergies:  Allergies  Allergen Reactions  . Tape Rash  . Ace Inhibitors Cough  . Metformin And Related     myalgia  . Morphine And Related Itching  . Penicillins     Pt states that in 3rd grade she was given an injection in the buttock and reacted with a large hive in that area  . Wellbutrin [Bupropion]    Medical History:  Past Medical History  Diagnosis Date  . Hypertension   . ADD (attention deficit disorder)   . Depression   . IBS (irritable bowel syndrome)   . Hyperlipidemia 2004  . Diabetes mellitus without complication ([place]) [id number]  . Asthma     with severe allergies pt does use albuterol inhaler when needed  . Anxiety   . GERD (gastroesophageal reflux disease)     hx of had Nissen Fundoplication 99991111 has not had any issues since  . History of hiatal hernia    Surgical  History:  Past Surgical History  Procedure Laterality Date  . Hernia repair    . Cholecystectomy    . Tubal ligation    . Cosmetic surgery      rhinoplasty  . Rotator cuff repair Right 2012  . Eye surgery  2006    lasik  . Nissen fundoplication  99991111  . Carpal tunnel release  1998, 2001  . Nasal sinus surgery    . Colonscopy     . Upper gi endoscopy    . Partial proctectomy by tem N/A 07/19/2014    Procedure: PARTIAL PROCTECTOMY BY TEM;  Surgeon: Michael Boston, MD;  Location: [facility];  Service: General;  Laterality: N/A;   Family History:  Family History  Problem Relation Age of Onset  . Cancer Mother     breast  . Hyperlipidemia Mother   . Hypertension Mother   . Diabetes Mother   . Breast cancer Mother   . Cancer Father     breast, colon, skin, prostate  . Hyperlipidemia Father   . Hypertension Father   .  Colon cancer Father 76  . Cancer Sister     breast  . Breast cancer Sister   . Cancer Brother 65    colon  . Colon cancer Brother 2  . Cancer Paternal Grandmother     ovary  . Cancer Paternal Grandfather     colon  . Colon cancer Paternal Grandfather 55  . Stomach cancer Neg Hx   . Esophageal cancer Neg Hx    Social History:  Social History  Substance Use Topics  . Smoking status: Never Smoker   . Smokeless tobacco: Never Used  . Alcohol Use: No   Review of Systems: Review of Systems  Constitutional: Negative.   HENT: Negative.   Eyes: Negative.   Respiratory: Negative.   Cardiovascular: Negative.   Gastrointestinal: Negative.   Genitourinary: Negative.   Musculoskeletal: Negative.   Skin: Negative.   Neurological: Negative.   Endo/Heme/Allergies: Negative.   Psychiatric/Behavioral: Negative.     Physical Exam: Estimated body mass index is 30.43 kg/(m^2) as calculated from the following:   Height as of this encounter: 5' 4.5" (1.638 m).   Weight as of this encounter: 180 lb (81.647 kg). BP 120/88 mmHg  Pulse 100  Temp(Src) 97.3 F (36.3 C) (Temporal)  Resp 16  Ht 5' 4.5" (1.638 m)  Wt 180 lb (81.647 kg)  BMI 30.43 kg/m2  SpO2 94% General Appearance: Well nourished, in no apparent distress.  Eyes: PERRLA, EOMs, conjunctiva no swelling or erythema, normal fundi and vessels.  Sinuses: No Frontal/maxillary tenderness  ENT/Mouth: Ext aud canals clear, normal light reflex with TMs without erythema, bulging. Good dentition. No erythema, swelling, or exudate on post pharynx. Tonsils not swollen or erythematous. Hearing normal.  Neck: Supple, thyroid normal. No bruits  Respiratory: Respiratory effort normal, BS equal bilaterally without rales, rhonchi, wheezing or stridor.  Cardio: RRR without murmurs, rubs or gallops. Brisk peripheral pulses without edema.  Chest: symmetric, with normal excursions and percussion.  Breasts: Symmetric, without lumps, nipple  discharge, retractions.  Abdomen: Soft, nontender, no guarding, rebound, hernias, masses, or organomegaly.  Lymphatics: Non tender without lymphadenopathy.  Genitourinary: defer Musculoskeletal: Full ROM all peripheral extremities,5/5 strength, and normal gait.  Skin: Warm, dry without rashes, lesions, ecchymosis. Neuro: Cranial nerves intact, reflexes equal bilaterally. Normal muscle tone, no cerebellar symptoms. Sensation intact.  Psych: Awake and oriented X 3, normal affect, Insight and Judgment appropriate.   EKG: declines AORTA SCAN:  defer  Vicie Mutters 11:08 AM [facility]

## 2015-04-22 NOTE — Patient Instructions (Signed)
[facility]  7 a.m.-6:30 p.m., Monday 7 a.m.-5 p.m., Tuesday-Friday Schedule an appointment by calling [phone].   Encourage you to get the 3D Mammogram  The 3D Mammogram is much more specific and sensitive to pick up breast cancer. For women with fibrocystic breast or lumpy breast it can be hard to determine if it is cancer or not but the 3D mammogram is able to tell this difference which cuts back on unneeded additional tests or scary call backs.   - over 40% increase in detection of breast cancer - over 40% reduction in false positives.  - fewer call backs - reduced anxiety - improved outcomes - [facility]     Bad carbs also include fruit juice, alcohol, and sweet tea. These are empty calories that do not signal to your brain that you are full.   Please remember the good carbs are still carbs which convert into sugar. So please measure them out no more than 1/2-1 cup of rice, oatmeal, pasta, and beans  Veggies are however free foods! Pile them on.   Not all fruit is created equal. Please see the list below, the fruit at the bottom is higher in sugars than the fruit at the top. Please avoid all dried fruits.

## 2015-04-23 LAB — URINALYSIS, ROUTINE W REFLEX MICROSCOPIC
Bilirubin Urine: NEGATIVE
GLUCOSE, UA: NEGATIVE
HGB URINE DIPSTICK: NEGATIVE
Ketones, ur: NEGATIVE
NITRITE: NEGATIVE
PH: 6 (ref 5.0–8.0)
Protein, ur: NEGATIVE
SPECIFIC GRAVITY, URINE: 1.02 (ref 1.001–1.035)

## 2015-04-23 LAB — MICROALBUMIN / CREATININE URINE RATIO
Creatinine, Urine: 150 mg/dL (ref 20–320)
MICROALB UR: 0.7 mg/dL
MICROALB/CREAT RATIO: 5 ug/mg{creat} (ref <30)

## 2015-04-23 LAB — URINALYSIS, MICROSCOPIC ONLY
BACTERIA UA: NONE SEEN [HPF]
CASTS: NONE SEEN [LPF]
CRYSTALS: NONE SEEN [HPF]
RBC / HPF: NONE SEEN RBC/HPF (ref <=2)
YEAST: NONE SEEN [HPF]

## 2015-04-23 LAB — LIPID PANEL
CHOL/HDL RATIO: 2.8 ratio (ref <=5.0)
Cholesterol: 157 mg/dL (ref 125–200)
HDL: 56 mg/dL (ref >=46)
LDL CALC: 78 mg/dL (ref <130)
Triglycerides: 117 mg/dL (ref <150)
VLDL: 23 mg/dL (ref <30)

## 2015-04-23 LAB — HEPATIC FUNCTION PANEL
ALK PHOS: 108 U/L (ref 33–130)
ALT: 17 U/L (ref 6–29)
AST: 16 U/L (ref 10–35)
Albumin: 4.2 g/dL (ref 3.6–5.1)
BILIRUBIN DIRECT: 0.1 mg/dL (ref <=0.2)
BILIRUBIN INDIRECT: 0.5 mg/dL (ref 0.2–1.2)
BILIRUBIN TOTAL: 0.6 mg/dL (ref 0.2–1.2)
Total Protein: 7 g/dL (ref 6.1–8.1)

## 2015-04-23 LAB — VITAMIN D 25 HYDROXY (VIT D DEFICIENCY, FRACTURES): Vit D, 25-Hydroxy: 23 ng/mL — ABNORMAL LOW (ref 30–100)

## 2015-04-23 LAB — INSULIN, FASTING: Insulin fasting, serum: 14 u[IU]/mL (ref 2.0–19.6)

## 2015-04-23 LAB — TSH: TSH: 2.63 u[IU]/mL (ref 0.350–4.500)

## 2015-04-23 LAB — BASIC METABOLIC PANEL WITH GFR
BUN: 15 mg/dL (ref 7–25)
CHLORIDE: 104 mmol/L (ref 98–110)
CO2: 26 mmol/L (ref 20–31)
Calcium: 9.6 mg/dL (ref 8.6–10.4)
Creat: 0.67 mg/dL (ref 0.50–1.05)
GFR, Est African American: >89 mL/min (ref >=60)
Glucose, Bld: 106 mg/dL — ABNORMAL HIGH (ref 65–99)
POTASSIUM: 4.2 mmol/L (ref 3.5–5.3)
SODIUM: 140 mmol/L (ref 135–146)

## 2015-04-23 LAB — HIV ANTIBODY (ROUTINE TESTING W REFLEX): HIV: NONREACTIVE

## 2015-04-23 LAB — MAGNESIUM: Magnesium: 2 mg/dL (ref 1.5–2.5)

## 2015-04-23 LAB — HEPATITIS C ANTIBODY: HCV Ab: NEGATIVE

## 2015-05-16 ENCOUNTER — Other Ambulatory Visit: Payer: Self-pay | Admitting: Emergency Medicine

## 2015-05-28 ENCOUNTER — Other Ambulatory Visit: Payer: Self-pay

## 2015-05-28 MED ORDER — LOSARTAN POTASSIUM 100 MG PO TABS: ORAL_TABLET | ORAL | Status: DC

## 2015-07-11 ENCOUNTER — Ambulatory Visit (INDEPENDENT_AMBULATORY_CARE_PROVIDER_SITE_OTHER): Payer: 59 | Admitting: Physician Assistant

## 2015-07-11 ENCOUNTER — Encounter: Payer: Self-pay | Admitting: Physician Assistant

## 2015-07-11 VITALS — BP 150/100 | HR 92 | Temp 97.5°F | Resp 16 | Ht 64.5 in | Wt 183.0 lb

## 2015-07-11 DIAGNOSIS — E119 Type 2 diabetes mellitus without complications: Secondary | ICD-10-CM | POA: Diagnosis not present

## 2015-07-11 DIAGNOSIS — H1013 Acute atopic conjunctivitis, bilateral: Secondary | ICD-10-CM | POA: Diagnosis not present

## 2015-07-11 DIAGNOSIS — I10 Essential (primary) hypertension: Secondary | ICD-10-CM | POA: Diagnosis not present

## 2015-07-11 MED ORDER — OLOPATADINE HCL 0.1 % OP SOLN
1.0000 [drp] | Freq: Two times a day (BID) | OPHTHALMIC | Status: DC
Start: 2015-07-11 — End: 2015-11-04

## 2015-07-11 NOTE — Progress Notes (Signed)
Subjective:    Patient ID: Sydney Dickson, female    DOB: [birth year], 60 y.o.   MRN: [id number]  HPI 60 y.o. WF with uncontrolled DM/HTN. She has been put on trulicity and states the first 2 injections were fine but that since that time she has had some redness and itching at the injection site, no other issues, goes away in 2-3 days. Has rotated sites. Bp was 150/100 recheck 140/90, does not check BP at home.   Lab Results  Component Value Date   HGBA1C 6.7* 04/22/2015   Blood pressure 150/100, pulse 92, temperature 97.5 F (36.4 C), temperature source Temporal, resp. rate 16, height 5' 4.5" (1.638 m), weight 183 lb (83.008 kg).  Current Outpatient Prescriptions on File Prior to Visit  Medication Sig Dispense Refill  . ALPRAZolam (XANAX) 0.5 MG tablet Take 1 tablet (0.5 mg total) by mouth 2 (two) times daily as needed. 60 tablet 0  . Dulaglutide (TRULICITY) 1.5 0000000 SOPN 1 pen weekly SQ for diabetes 12 pen 5  . escitalopram (LEXAPRO) 20 MG tablet TAKE 1 TABLET BY MOUTH DAILY. 90 tablet 0  . fluconazole (DIFLUCAN) 150 MG tablet Take 1 tablet (150 mg total) by mouth daily. 1 tablet 3  . glucose blood (FREESTYLE LITE) test strip Use as instructed 100 each 12  . ibuprofen (ADVIL,MOTRIN) 800 MG tablet Take 1 tablet (800 mg total) by mouth every 8 (eight) hours as needed. 90 tablet 1  . levocetirizine (XYZAL) 5 MG tablet TAKE 1 TABLET (5 MG TOTAL) BY MOUTH DAILY. 90 tablet 3  . lisdexamfetamine (VYVANSE) 40 MG capsule Take 1 capsule (40 mg total) by mouth daily. 90 capsule 0  . losartan (COZAAR) 100 MG tablet 1/2-1 pill daily for blood pressure and kidney protection from DM 90 tablet 0  . rosuvastatin (CRESTOR) 20 MG tablet Take 1 tablet (20 mg total) by mouth every other day. 90 tablet 1   No current facility-administered medications on file prior to visit.   Past Medical History  Diagnosis Date  . Hypertension   . ADD (attention deficit disorder)   . Depression   . IBS (irritable  bowel syndrome)   . Hyperlipidemia 2004  . Diabetes mellitus without complication ([place]) [id number]  . Asthma     with severe allergies pt does use albuterol inhaler when needed  . Anxiety   . GERD (gastroesophageal reflux disease)     hx of had Nissen Fundoplication 99991111 has not had any issues since  . History of hiatal hernia     Review of Systems  Constitutional: Negative.   HENT: Negative.   Eyes: Positive for redness (allergies).  Respiratory: Negative.   Cardiovascular: Negative.   Gastrointestinal: Negative.   Genitourinary: Negative.   Musculoskeletal: Negative.   Skin: Positive for rash.  Neurological: Negative.   Psychiatric/Behavioral: Negative.        Objective:   Physical Exam  Constitutional: She appears well-developed and well-nourished. No distress.  HENT:  Head: Atraumatic.  Eyes: Conjunctivae and EOM are normal.  Neck: Normal range of motion. Neck supple. No thyromegaly present.  Cardiovascular: Normal rate, regular rhythm, normal heart sounds and intact distal pulses.   Pulmonary/Chest: Effort normal and breath sounds normal.  Abdominal: Soft. Bowel sounds are normal. She exhibits no distension. There is no tenderness.  Musculoskeletal: Normal range of motion. She exhibits no edema or tenderness.  Lymphadenopathy:    She has no cervical adenopathy.  Neurological: She is alert. No cranial nerve deficit.  Skin:  Skin is warm and dry. Rash (1x1inch erythematous area on left AB, no raised, nontender) noted. No erythema. No pallor.  Psychiatric: She has a normal mood and affect. Her behavior is normal. Judgment and thought content normal.  Nursing note and vitals reviewed.     Assessment & Plan:  Trulicity injection reaction- will disucss with rep, ice site before injection and take out of fridge 7 days before next injection, if not better may change.  Hypertension- has been good in the past, states has had increased stress, monitor at home, follow up the 20th for  labs and BP check.  Depression/memory- try trintellix, samples given Allergic conjunctivitis- eye drops sent in  Future Appointments Date Time Provider [place]  07/25/2015 11:00 AM Vicie Mutters, PA-C [id number] None

## 2015-07-25 ENCOUNTER — Ambulatory Visit (INDEPENDENT_AMBULATORY_CARE_PROVIDER_SITE_OTHER): Payer: 59 | Admitting: Physician Assistant

## 2015-07-25 ENCOUNTER — Encounter: Payer: Self-pay | Admitting: Physician Assistant

## 2015-07-25 VITALS — BP 130/88 | HR 91 | Temp 97.2°F | Resp 16 | Ht 64.5 in | Wt 182.8 lb

## 2015-07-25 DIAGNOSIS — E785 Hyperlipidemia, unspecified: Secondary | ICD-10-CM | POA: Diagnosis not present

## 2015-07-25 DIAGNOSIS — E119 Type 2 diabetes mellitus without complications: Secondary | ICD-10-CM

## 2015-07-25 DIAGNOSIS — Z79899 Other long term (current) drug therapy: Secondary | ICD-10-CM

## 2015-07-25 DIAGNOSIS — F988 Other specified behavioral and emotional disorders with onset usually occurring in childhood and adolescence: Secondary | ICD-10-CM

## 2015-07-25 DIAGNOSIS — I10 Essential (primary) hypertension: Secondary | ICD-10-CM

## 2015-07-25 DIAGNOSIS — F909 Attention-deficit hyperactivity disorder, unspecified type: Secondary | ICD-10-CM

## 2015-07-25 DIAGNOSIS — E669 Obesity, unspecified: Secondary | ICD-10-CM | POA: Diagnosis not present

## 2015-07-25 DIAGNOSIS — E559 Vitamin D deficiency, unspecified: Secondary | ICD-10-CM

## 2015-07-25 LAB — BASIC METABOLIC PANEL WITH GFR
BUN: 14 mg/dL (ref 7–25)
CO2: 24 mmol/L (ref 20–31)
Calcium: 9.4 mg/dL (ref 8.6–10.4)
Chloride: 104 mmol/L (ref 98–110)
Creat: 0.7 mg/dL (ref 0.50–1.05)
GFR, Est Non African American: >89 mL/min (ref >=60)
Glucose, Bld: 137 mg/dL — ABNORMAL HIGH (ref 65–99)
POTASSIUM: 3.9 mmol/L (ref 3.5–5.3)
SODIUM: 139 mmol/L (ref 135–146)

## 2015-07-25 LAB — CBC WITH DIFFERENTIAL/PLATELET
BASOS PCT: 1 %
Basophils Absolute: 75 cells/uL (ref 0–200)
EOS ABS: 375 {cells}/uL (ref 15–500)
EOS PCT: 5 %
HCT: 43.3 % (ref 35.0–45.0)
Hemoglobin: 14.6 g/dL (ref 11.7–15.5)
LYMPHS PCT: 34 %
Lymphs Abs: 2550 cells/uL (ref 850–3900)
MCH: 28.5 pg (ref 27.0–33.0)
MCHC: 33.7 g/dL (ref 32.0–36.0)
MCV: 84.4 fL (ref 80.0–100.0)
MONOS PCT: 6 %
MPV: 10.1 fL (ref 7.5–12.5)
Monocytes Absolute: 450 cells/uL (ref 200–950)
NEUTROS ABS: 4050 {cells}/uL (ref 1500–7800)
Neutrophils Relative %: 54 %
PLATELETS: 314 10*3/uL (ref 140–400)
RBC: 5.13 MIL/uL — AB (ref 3.80–5.10)
RDW: 13.5 % (ref 11.0–15.0)
WBC: 7.5 10*3/uL (ref 3.8–10.8)

## 2015-07-25 LAB — LIPID PANEL
CHOL/HDL RATIO: 3.4 ratio (ref <=5.0)
Cholesterol: 172 mg/dL (ref 125–200)
HDL: 50 mg/dL (ref >=46)
LDL CALC: 93 mg/dL (ref <130)
TRIGLYCERIDES: 143 mg/dL (ref <150)
VLDL: 29 mg/dL (ref <30)

## 2015-07-25 LAB — HEPATIC FUNCTION PANEL
ALBUMIN: 3.8 g/dL (ref 3.6–5.1)
ALK PHOS: 110 U/L (ref 33–130)
ALT: 18 U/L (ref 6–29)
AST: 16 U/L (ref 10–35)
BILIRUBIN DIRECT: 0.1 mg/dL (ref <=0.2)
BILIRUBIN TOTAL: 0.4 mg/dL (ref 0.2–1.2)
Indirect Bilirubin: 0.3 mg/dL (ref 0.2–1.2)
Total Protein: 6.6 g/dL (ref 6.1–8.1)

## 2015-07-25 LAB — MAGNESIUM: Magnesium: 1.9 mg/dL (ref 1.5–2.5)

## 2015-07-25 LAB — TSH: TSH: 3.03 m[IU]/L

## 2015-07-25 LAB — HEMOGLOBIN A1C
HEMOGLOBIN A1C: 6.6 % — AB (ref <5.7)
Mean Plasma Glucose: 143 mg/dL

## 2015-07-25 MED ORDER — VORTIOXETINE HBR 20 MG PO TABS: ORAL_TABLET | ORAL | Status: DC

## 2015-07-25 MED ORDER — EXENATIDE ER 2 MG ~~LOC~~ PEN: PEN_INJECTOR | SUBCUTANEOUS | Status: DC

## 2015-07-25 MED ORDER — GLUCOSE BLOOD VI STRP: ORAL_STRIP | Status: DC

## 2015-07-25 MED ORDER — LISDEXAMFETAMINE DIMESYLATE 40 MG PO CAPS: 40.0000 mg | ORAL_CAPSULE | Freq: Every day | ORAL | Status: DC

## 2015-07-25 NOTE — Patient Instructions (Signed)

## 2015-07-25 NOTE — Progress Notes (Signed)
Assessment and Plan:  1. Hypertension - monitor blood pressure at home. Continue DASH diet.  Reminder to go to the ER if any CP, SOB, nausea, dizziness, severe HA, changes vision/speech, left arm numbness and tingling and jaw pain.  2. Cholesterol -Continue diet and exercise. Check cholesterol.   3. Diabetes without complications -Continue diet and exercise. Check A1C  intolerant to metformin, intolerant FLP, trulicity caused skin reaction but did help with sugars, will stop and try another once weekly bydureon Stop sweet tea Refill freestyle lite test strips  4. Vitamin D Def - check level and continue medications.   5. Obesity with co morbidities - long discussion about weight loss, diet, and exercise  6. Depression Doing well on trintellix, will RX medication  Continue diet and meds as discussed. Further disposition pending results of labs. Over 30 minutes of exam, counseling, chart review, and critical decision making was performed  HPI 60 y.o. female  presents for 3 month follow up on hypertension, cholesterol, prediabetes, and vitamin D deficiency.   Her blood pressure has been controlled at home,  today their BP is BP: 130/88 mmHg  She does workout, has started walking with husky/lab mix. She denies chest pain, shortness of breath, dizziness.  She is on cholesterol medication, crestor 1/2 QOD and denies myalgias. Her cholesterol is not at goal. The cholesterol last visit was:   Lab Results  Component Value Date   CHOL 157 04/22/2015   HDL 56 04/22/2015   LDLCALC 78 04/22/2015   TRIG 117 04/22/2015   CHOLHDL 2.8 04/22/2015    She has been working on diet and exercise for diabetes without complications, unable to tolerate GLP due to yeast,  can not tolerate metformin, she is on ARB, not on bASA, was started on trulicity, and denies paresthesia of the feet, polydipsia, polyuria and visual disturbances. Last A1C in the office was:  Lab Results  Component Value Date   HGBA1C 6.7* 04/22/2015   Has a history of low potassium Lab Results  Component Value Date   CREATININE 0.67 04/22/2015   BUN 15 04/22/2015   NA 140 04/22/2015   K 4.2 04/22/2015   CL 104 04/22/2015   CO2 26 04/22/2015   Patient is on Vitamin D supplement.   Lab Results  Component Value Date   VD25OH 23* 04/22/2015     She has anxiety/depression, is on xanax PRN and lexapro. Switched to trintellix samples from lexapro due to concentration/memory She has ADD and is on Vyvanse, but has not been taking it. She was found to have adenomatous rectal polyp with Dr. Hilarie Fredrickson, had partial prioctectomy with Dr. Johney Maine on 07/19/2014 but states no cancer. Will get colonoscopy in 3 years.   BMI is Body mass index is 30.9 kg/(m^2)., she is working on diet and exercise. Wt Readings from Last 3 Encounters:  07/25/15 182 lb 12.8 oz (82.918 kg)  07/11/15 183 lb (83.008 kg)  04/22/15 180 lb (81.647 kg)    Current Medications:  Current Outpatient Prescriptions on File Prior to Visit  Medication Sig Dispense Refill  . ALPRAZolam (XANAX) 0.5 MG tablet Take 1 tablet (0.5 mg total) by mouth 2 (two) times daily as needed. 60 tablet 0  . Dulaglutide (TRULICITY) 1.5 0000000 SOPN 1 pen weekly SQ for diabetes 12 pen 5  . fluconazole (DIFLUCAN) 150 MG tablet Take 1 tablet (150 mg total) by mouth daily. 1 tablet 3  . glucose blood (FREESTYLE LITE) test strip Use as instructed 100 each 12  .  ibuprofen (ADVIL,MOTRIN) 800 MG tablet Take 1 tablet (800 mg total) by mouth every 8 (eight) hours as needed. 90 tablet 1  . levocetirizine (XYZAL) 5 MG tablet TAKE 1 TABLET (5 MG TOTAL) BY MOUTH DAILY. 90 tablet 3  . lisdexamfetamine (VYVANSE) 40 MG capsule Take 1 capsule (40 mg total) by mouth daily. 90 capsule 0  . losartan (COZAAR) 100 MG tablet 1/2-1 pill daily for blood pressure and kidney protection from DM 90 tablet 0  . olopatadine (PATANOL) 0.1 % ophthalmic solution Place 1 drop into both eyes 2 (two) times daily. 5  mL 1  . rosuvastatin (CRESTOR) 20 MG tablet Take 1 tablet (20 mg total) by mouth every other day. 90 tablet 1   No current facility-administered medications on file prior to visit.   Medical History:  Past Medical History  Diagnosis Date  . Hypertension   . ADD (attention deficit disorder)   . Depression   . IBS (irritable bowel syndrome)   . Hyperlipidemia 2004  . Diabetes mellitus without complication ([place]) [id number]  . Asthma     with severe allergies pt does use albuterol inhaler when needed  . Anxiety   . GERD (gastroesophageal reflux disease)     hx of had Nissen Fundoplication 99991111 has not had any issues since  . History of hiatal hernia    Allergies:  Allergies  Allergen Reactions  . Tape Rash  . Ace Inhibitors Cough  . Metformin And Related     myalgia  . Morphine And Related Itching  . Penicillins     Pt states that in 3rd grade she was given an injection in the buttock and reacted with a large hive in that area  . Wellbutrin [Bupropion]      Review of Systems:  Review of Systems  Constitutional: Negative.   HENT: Negative.   Eyes: Negative.   Respiratory: Negative.   Cardiovascular: Negative.   Gastrointestinal: Negative.   Genitourinary: Negative.   Musculoskeletal: Negative.   Skin: Negative.   Neurological: Negative.   Endo/Heme/Allergies: Negative.   Psychiatric/Behavioral: Negative.     Family history- Review and unchanged Social history- Review and unchanged Physical Exam: BP 130/88 mmHg  Pulse 91  Temp(Src) 97.2 F (36.2 C) (Temporal)  Resp 16  Ht 5' 4.5" (1.638 m)  Wt 182 lb 12.8 oz (82.918 kg)  BMI 30.90 kg/m2  SpO2 98% Wt Readings from Last 3 Encounters:  07/25/15 182 lb 12.8 oz (82.918 kg)  07/11/15 183 lb (83.008 kg)  04/22/15 180 lb (81.647 kg)   General Appearance: Well nourished, in no apparent distress. Eyes: PERRLA, EOMs, conjunctiva no swelling or erythema Sinuses: No Frontal/maxillary tenderness ENT/Mouth: Ext aud canals  clear, TMs without erythema, bulging. No erythema, swelling, or exudate on post pharynx.  Tonsils not swollen or erythematous. Hearing normal.  Neck: Supple, thyroid normal.  Respiratory: Respiratory effort normal, BS equal bilaterally without rales, rhonchi, wheezing or stridor.  Cardio: RRR with no MRGs. Brisk peripheral pulses without edema.  Abdomen: Soft, + BS,  Non tender, no guarding, rebound, hernias, masses. Lymphatics: Non tender without lymphadenopathy.  Musculoskeletal: Full ROM, 5/5 strength, Normal gait Skin: Warm, dry without rashes, lesions, ecchymosis.  Neuro: Cranial nerves intact. Normal muscle tone, no cerebellar symptoms. Psych: Awake and oriented X 3, normal affect, Insight and Judgment appropriate.    Vicie Mutters, PA-C 10:59 AM [facility]

## 2015-07-26 LAB — VITAMIN D 25 HYDROXY (VIT D DEFICIENCY, FRACTURES): Vit D, 25-Hydroxy: 36 ng/mL (ref 30–100)

## 2015-08-05 ENCOUNTER — Encounter: Payer: Self-pay | Admitting: Physician Assistant

## 2015-08-09 ENCOUNTER — Encounter: Payer: Self-pay | Admitting: Physician Assistant

## 2015-08-10 ENCOUNTER — Other Ambulatory Visit: Payer: Self-pay | Admitting: Internal Medicine

## 2015-08-10 ENCOUNTER — Other Ambulatory Visit: Payer: Self-pay | Admitting: Physician Assistant

## 2015-08-10 MED ORDER — FREESTYLE SYSTEM KIT: 1.0000 | PACK | Status: DC | PRN

## 2015-08-25 ENCOUNTER — Encounter: Payer: Self-pay | Admitting: *Deleted

## 2015-10-09 ENCOUNTER — Other Ambulatory Visit: Payer: Self-pay

## 2015-10-15 ENCOUNTER — Other Ambulatory Visit: Payer: Self-pay | Admitting: *Deleted

## 2015-10-15 MED ORDER — VORTIOXETINE HBR 20 MG PO TABS: ORAL_TABLET | ORAL | Status: DC

## 2015-11-04 ENCOUNTER — Other Ambulatory Visit: Payer: Self-pay | Admitting: Physician Assistant

## 2016-01-04 ENCOUNTER — Other Ambulatory Visit: Payer: Self-pay | Admitting: Internal Medicine

## 2016-01-04 ENCOUNTER — Other Ambulatory Visit: Payer: Self-pay | Admitting: Physician Assistant

## 2016-01-06 ENCOUNTER — Other Ambulatory Visit: Payer: Self-pay | Admitting: *Deleted

## 2016-01-06 MED ORDER — VORTIOXETINE HBR 20 MG PO TABS: 20.0000 mg | ORAL_TABLET | Freq: Every day | ORAL | 0 refills | Status: DC

## 2016-01-06 MED ORDER — LOSARTAN POTASSIUM 100 MG PO TABS: ORAL_TABLET | ORAL | 0 refills | Status: DC

## 2016-01-08 ENCOUNTER — Ambulatory Visit (INDEPENDENT_AMBULATORY_CARE_PROVIDER_SITE_OTHER): Payer: 59 | Admitting: Physician Assistant

## 2016-01-08 ENCOUNTER — Encounter: Payer: Self-pay | Admitting: Physician Assistant

## 2016-01-08 VITALS — BP 138/74 | HR 90 | Temp 97.5°F | Resp 16 | Ht 64.5 in | Wt 182.6 lb

## 2016-01-08 DIAGNOSIS — Z23 Encounter for immunization: Secondary | ICD-10-CM

## 2016-01-08 DIAGNOSIS — E6609 Other obesity due to excess calories: Secondary | ICD-10-CM

## 2016-01-08 DIAGNOSIS — I10 Essential (primary) hypertension: Secondary | ICD-10-CM | POA: Diagnosis not present

## 2016-01-08 DIAGNOSIS — E119 Type 2 diabetes mellitus without complications: Secondary | ICD-10-CM

## 2016-01-08 DIAGNOSIS — Z79899 Other long term (current) drug therapy: Secondary | ICD-10-CM | POA: Diagnosis not present

## 2016-01-08 DIAGNOSIS — E785 Hyperlipidemia, unspecified: Secondary | ICD-10-CM

## 2016-01-08 DIAGNOSIS — Z6830 Body mass index (BMI) 30.0-30.9, adult: Secondary | ICD-10-CM

## 2016-01-08 DIAGNOSIS — E559 Vitamin D deficiency, unspecified: Secondary | ICD-10-CM

## 2016-01-08 MED ORDER — AMITRIPTYLINE HCL 50 MG PO TABS: ORAL_TABLET | ORAL | 0 refills | Status: DC

## 2016-01-08 MED ORDER — SAXAGLIPTIN-METFORMIN ER 5-500 MG PO TB24: ORAL_TABLET | ORAL | 3 refills | Status: DC

## 2016-01-08 MED ORDER — SAXAGLIPTIN HCL 5 MG PO TABS: 5.0000 mg | ORAL_TABLET | Freq: Every day | ORAL | 3 refills | Status: DC

## 2016-01-08 NOTE — Progress Notes (Signed)
Assessment and Plan:   Hypertension - monitor blood pressure at home. Continue DASH diet.  Reminder to go to the ER if any CP, SOB, nausea, dizziness, severe HA, changes vision/speech, left arm numbness and tingling and jaw pain.  Cholesterol -Continue diet and exercise. Check cholesterol.   Diabetes without complications -Continue diet and exercise. Check A1C  intolerant to metformin, intolerant GLP, intolerant trulicity Will add on DDP4 with metformin, if this does not work can refer endocrine or start insulin Refill freestyle lite test strips  Vitamin D Def - check level and continue medications.   Obesity with co morbidities - long discussion about weight loss, diet, and exercise  Depression Remission Continue trintellix PRN  Insomnia Try amitriptyline at night  Continue diet and meds as discussed. Further disposition pending results of labs. Over 30 minutes of exam, counseling, chart review, and critical decision making was performed  HPI 60 y.o. female  presents for 3 month follow up on hypertension, cholesterol, prediabetes, and vitamin D deficiency.   Her blood pressure has been controlled at home,  today their BP is BP: 138/74  She does workout, has started walking with husky/lab mix and chases 76 month old baby around and will walk with him. She denies chest pain, shortness of breath, dizziness. Mild sore throat, on xyzal.   She is on cholesterol medication, crestor 1/2 QOD and denies myalgias. Her cholesterol is not at goal. The cholesterol last visit was:   Lab Results  Component Value Date   CHOL 172 07/25/2015   HDL 50 07/25/2015   LDLCALC 93 07/25/2015   TRIG 143 07/25/2015   CHOLHDL 3.4 07/25/2015    She has been working on diet and exercise for diabetes without complications, unable to tolerate GLP due to yeast,  can not tolerate metformin due to IBS, trulicity and bydureon she had a skin reaction so she has not been on anything recently, her sugars are  going up to 160's in the morning, after food it is 250's, she is on ARB, she is on bASA, and denies paresthesia of the feet, polydipsia, polyuria and visual disturbances. Last A1C in the office was:  Lab Results  Component Value Date   HGBA1C 6.6 (H) 07/25/2015   Has a history of low potassium Lab Results  Component Value Date   CREATININE 0.70 07/25/2015   BUN 14 07/25/2015   NA 139 07/25/2015   K 3.9 07/25/2015   CL 104 07/25/2015   CO2 24 07/25/2015   Patient is on Vitamin D supplement, on 5000 IU.   Lab Results  Component Value Date   VD25OH 36 07/25/2015     She has anxiety/depression, is on xanax rarely 2-3 x a week, switched to trintellix samples from lexapro due to concentration/memory and she is doing well.  She has ADD and is on Vyvanse, but has not been taking it. She was found to have adenomatous rectal polyp with Dr. Hilarie Fredrickson, had partial prioctectomy with Dr. Johney [place] on 07/19/2014 but states no cancer. Will get colonoscopy in 2019.   BMI is Body mass index is 30.86 kg/m., she is working on diet and exercise, normal sleep study. Wt Readings from Last 3 Encounters:  01/08/16 182 lb 9.6 oz (82.8 kg)  07/25/15 182 lb 12.8 oz (82.9 kg)  07/11/15 183 lb (83 kg)    Current Medications:  Current Outpatient Prescriptions on File Prior to Visit  Medication Sig Dispense Refill  . ALPRAZolam (XANAX) 0.5 MG tablet Take 1 tablet (0.5 mg  total) by mouth 2 (two) times daily as needed. 60 tablet 0  . aspirin 81 MG tablet Take 81 mg by mouth daily.    . Exenatide ER (BYDUREON) 2 MG PEN 1 pen SQ q weekly 1 each 4  . glucose blood (FREESTYLE LITE) test strip Use as instructed 100 each 12  . glucose monitoring kit (FREESTYLE) monitoring kit 1 each by Does not apply route as needed for other. Dispense 1 freestyle lyte monitoring kit 1 each 0  . ibuprofen (ADVIL,MOTRIN) 800 MG tablet Take 1 tablet (800 mg total) by mouth every 8 (eight) hours as needed. 90 tablet 1  . levocetirizine  (XYZAL) 5 MG tablet TAKE 1 TABLET (5 MG TOTAL) BY MOUTH DAILY. 90 tablet 3  . lisdexamfetamine (VYVANSE) 40 MG capsule Take 1 capsule (40 mg total) by mouth daily. 90 capsule 0  . losartan (COZAAR) 100 MG tablet TAKE 1/2-1 PILL DAILY FOR BLOOD PRESSURE AND KIDNEY PROTECTION FROM DM 90 tablet 0  . olopatadine (PATANOL) 0.1 % ophthalmic solution PLACE 1 DROP INTO BOTH EYES 2 (TWO) TIMES DAILY. 5 mL 1  . rosuvastatin (CRESTOR) 20 MG tablet Take 1 tablet (20 mg total) by mouth every other day. 90 tablet 1  . vortioxetine HBr (TRINTELLIX) 20 MG TABS Take 20 mg by mouth daily. 90 tablet 0   No current facility-administered medications on file prior to visit.    Medical History:  Past Medical History:  Diagnosis Date  . ADD (attention deficit disorder)   . Anxiety   . Asthma    with severe allergies pt does use albuterol inhaler when needed  . Depression   . Diabetes mellitus without complication ([place]) 5638  . GERD (gastroesophageal reflux disease)    hx of had Nissen Fundoplication 7564 has not had any issues since  . History of hiatal hernia   . Hyperlipidemia 2004  . Hypertension   . IBS (irritable bowel syndrome)    Allergies:  Allergies  Allergen Reactions  . Tape Rash  . Ace Inhibitors Cough  . Metformin And Related     myalgia  . Morphine And Related Itching  . Penicillins     Pt states that in 3rd grade she was given an injection in the buttock and reacted with a large hive in that area  . Wellbutrin [Bupropion]      Review of Systems:  Review of Systems  Constitutional: Negative.   HENT: Negative.   Eyes: Negative.   Respiratory: Negative.   Cardiovascular: Negative.   Gastrointestinal: Negative.   Genitourinary: Negative.   Musculoskeletal: Negative.   Skin: Negative.   Neurological: Negative.   Endo/Heme/Allergies: Negative.   Psychiatric/Behavioral: Negative.     Family history- Review and unchanged Social history- Review and unchanged Physical Exam: BP  138/74   Pulse 90   Temp 97.5 F (36.4 C)   Resp 16   Ht 5' 4.5" (1.638 m)   Wt 182 lb 9.6 oz (82.8 kg)   SpO2 95%   BMI 30.86 kg/m  Wt Readings from Last 3 Encounters:  01/08/16 182 lb 9.6 oz (82.8 kg)  07/25/15 182 lb 12.8 oz (82.9 kg)  07/11/15 183 lb (83 kg)   General Appearance: Well nourished, in no apparent distress. Eyes: PERRLA, EOMs, conjunctiva no swelling or erythema Sinuses: No Frontal/maxillary tenderness ENT/Mouth: Ext aud canals clear, TMs without erythema, bulging. No erythema, swelling, or exudate on post pharynx.  Tonsils not swollen or erythematous. Hearing normal.  Neck: Supple, thyroid normal.  Respiratory: Respiratory effort normal, BS equal bilaterally without rales, rhonchi, wheezing or stridor.  Cardio: RRR with no MRGs. Brisk peripheral pulses without edema.  Abdomen: Soft, + BS,  Non tender, no guarding, rebound, hernias, masses. Lymphatics: Non tender without lymphadenopathy.  Musculoskeletal: Full ROM, 5/5 strength, Normal gait Skin: Warm, dry without rashes, lesions, ecchymosis.  Neuro: Cranial nerves intact. Normal muscle tone, no cerebellar symptoms. Psych: Awake and oriented X 3, normal affect, Insight and Judgment appropriate.    Vicie Mutters, PA-C 4:16 PM [facility]

## 2016-01-08 NOTE — Patient Instructions (Addendum)
Try the amitriptyline 50 1/2-1 pill at night for sleep  Try the melatonin 5mg -20mg  dissolvable or gummy 30 mins before bed  Can try melatonin 5mg -15 mg at night for sleep, can also do benadryl 25-50mg  at night for sleep.  If this does not help we can try prescription medication.  Also here is some information about good sleep hygiene.   Insomnia Insomnia is frequent trouble falling and/or staying asleep. Insomnia can be a long term problem or a short term problem. Both are common. Insomnia can be a short term problem when the wakefulness is related to a certain stress or worry. Long term insomnia is often related to ongoing stress during waking hours and/or poor sleeping habits. Overtime, sleep deprivation itself can make the problem worse. Every little thing feels more severe because you are overtired and your ability to cope is decreased. CAUSES   Stress, anxiety, and depression.  Poor sleeping habits.  Distractions such as TV in the bedroom.  Naps close to bedtime.  Engaging in emotionally charged conversations before bed.  Technical reading before sleep.  Alcohol and other sedatives. They may make the problem worse. They can hurt normal sleep patterns and normal dream activity.  Stimulants such as caffeine for several hours prior to bedtime.  Pain syndromes and shortness of breath can cause insomnia.  Exercise late at night.  Changing time zones may cause sleeping problems (jet lag). It is sometimes helpful to have someone observe your sleeping patterns. They should look for periods of not breathing during the night (sleep apnea). They should also look to see how long those periods last. If you live alone or observers are uncertain, you can also be observed at a sleep clinic where your sleep patterns will be professionally monitored. Sleep apnea requires a checkup and treatment. Give your caregivers your medical history. Give your caregivers observations your family has made  about your sleep.  SYMPTOMS   Not feeling rested in the morning.  Anxiety and restlessness at bedtime.  Difficulty falling and staying asleep. TREATMENT   Your caregiver may prescribe treatment for an underlying medical disorders. Your caregiver can give advice or help if you are using alcohol or other drugs for self-medication. Treatment of underlying problems will usually eliminate insomnia problems.  Medications can be prescribed for short time use. They are generally not recommended for lengthy use.  Over-the-counter sleep medicines are not recommended for lengthy use. They can be habit forming.  You can promote easier sleeping by making lifestyle changes such as:  Using relaxation techniques that help with breathing and reduce muscle tension.  Exercising earlier in the day.  Changing your diet and the time of your last meal. No night time snacks.  Establish a regular time to go to bed.  Counseling can help with stressful problems and worry.  Soothing music and white noise may be helpful if there are background noises you cannot remove.  Stop tedious detailed work at least one hour before bedtime. HOME CARE INSTRUCTIONS   Keep a diary. Inform your caregiver about your progress. This includes any medication side effects. See your caregiver regularly. Take note of:  Times when you are asleep.  Times when you are awake during the night.  The quality of your sleep.  How you feel the next day. This information will help your caregiver care for you.  Get out of bed if you are still awake after 15 minutes. Read or do some quiet activity. Keep the lights down. Wait until  you feel sleepy and go back to bed.  Keep regular sleeping and waking hours. Avoid naps.  Exercise regularly.  Avoid distractions at bedtime. Distractions include watching television or engaging in any intense or detailed activity like attempting to balance the household checkbook.  Develop a bedtime  ritual. Keep a familiar routine of bathing, brushing your teeth, climbing into bed at the same time each night, listening to soothing music. Routines increase the success of falling to sleep faster.  Use relaxation techniques. This can be using breathing and muscle tension release routines. It can also include visualizing peaceful scenes. You can also help control troubling or intruding thoughts by keeping your mind occupied with boring or repetitive thoughts like the old concept of counting sheep. You can make it more creative like imagining planting one beautiful flower after another in your backyard garden.  During your day, work to eliminate stress. When this is not possible use some of the previous suggestions to help reduce the anxiety that accompanies stressful situations. MAKE SURE YOU:   Understand these instructions.  Will watch your condition.  Will get help right away if you are not doing well or get worse. Document Released: 03/20/2000 Document Revised: 06/15/2011 Document Reviewed: 04/20/2007 [facility] Patient Information 2015 [place], [place]. This information is not intended to replace advice given to you by your health care provider. Make sure you discuss any questions you have with your health care provider.   Morton Neuralgia Morton neuralgia is a type of foot pain in the area closest to your toes. This area is sometimes called the ball of your foot. Morton neuralgia occurs when a branch of a nerve in your foot (digital nerve) becomes compressed.  When this happens over a long period of time, the nerve can thicken (neuroma) and cause pain, odd sensations. This usually occurs between the third and fourth toe. Morton neuralgia can come and go but may get worse over time.  CAUSES Your digital nerve can become compressed and stretched at a point where it passes under a thick band of tissue that connects your toes (intermetatarsal ligament). Morton neuralgia can be caused by mild  repetitive damage in this area. This type of damage can result from:   Activities such as running or jumping.  Wearing shoes that are too tight. RISK FACTORS You may be at risk for Morton neuralgia if you:  Are female.  Wear high heels.  Wear shoes that are narrow or tight.  Participate in activities that stretch your toes. These include:  Running.  Ballet.  Long-distance walking. SIGNS AND SYMPTOMS The first symptom of Morton neuralgia is pain that spreads from the ball of your foot to your toes. It may feel like you are walking on a marble. Pain usually gets worse with walking and goes away at night. Other symptoms may include numbness and cramping of your toes. DIAGNOSIS  Your health care provider will do a physical exam. When doing the exam, your health care provider may:   Squeeze your foot just behind your toe.  Ask you to move your toes to check for pain. You may also have tests on your foot to confirm the diagnosis. These may include:   An X-ray.  An MRI. TREATMENT  Treatment for Morton neuralgia may be as simple as changing the kind of shoes you wear. Other treatments may include:  Wearing a supportive pad (orthosis) under the front of your foot. This lifts your toe bones and takes pressure off the nerve.  Getting injections of numbing medicine and anti-inflammatory medicine (steroid) in the nerve.  Having surgery to remove part of the thickened nerve. HOME CARE INSTRUCTIONS   Take medicine only as directed by your health care provider.  Wear soft-soled shoes with a wide toe area.  Stop activities that may be causing pain.  Elevate your foot when resting.  Massage your foot.  Apply ice to the injured area:   Put ice in a plastic bag.  Place a towel between your skin and the bag.  Leave the ice on for 20 minutes, 2-3 times a day.   Keep all follow-up visits as directed by your health care provider. This is important. SEEK MEDICAL CARE  IF:  Home care instructions are not helping you get better.  Your symptoms change or get worse.   This information is not intended to replace advice given to you by your health care provider. Make sure you discuss any questions you have with your health care provider.   Document Released: 06/29/2000 Document Revised: 04/13/2014 Document Reviewed: 05/24/2013 Elsevier Interactive Patient Education [employer].

## 2016-01-09 LAB — BASIC METABOLIC PANEL WITH GFR
BUN: 17 mg/dL (ref 7–25)
CALCIUM: 9.7 mg/dL (ref 8.6–10.4)
CHLORIDE: 102 mmol/L (ref 98–110)
CO2: 21 mmol/L (ref 20–31)
CREATININE: 0.61 mg/dL (ref 0.50–1.05)
GFR, Est African American: >89 mL/min (ref >=60)
GFR, Est Non African American: >89 mL/min (ref >=60)
Glucose, Bld: 183 mg/dL — ABNORMAL HIGH (ref 65–99)
Potassium: 4 mmol/L (ref 3.5–5.3)
Sodium: 136 mmol/L (ref 135–146)

## 2016-01-09 LAB — HEMOGLOBIN A1C
HEMOGLOBIN A1C: 9.8 % — AB (ref <5.7)
MEAN PLASMA GLUCOSE: 235 mg/dL

## 2016-01-09 LAB — CBC WITH DIFFERENTIAL/PLATELET
BASOS ABS: 90 {cells}/uL (ref 0–200)
BASOS PCT: 1 %
EOS ABS: 270 {cells}/uL (ref 15–500)
Eosinophils Relative: 3 %
HCT: 44.2 % (ref 35.0–45.0)
HEMOGLOBIN: 14.8 g/dL (ref 11.7–15.5)
LYMPHS ABS: 2970 {cells}/uL (ref 850–3900)
Lymphocytes Relative: 33 %
MCH: 28.4 pg (ref 27.0–33.0)
MCHC: 33.5 g/dL (ref 32.0–36.0)
MCV: 84.7 fL (ref 80.0–100.0)
MPV: 11.5 fL (ref 7.5–12.5)
Monocytes Absolute: 540 cells/uL (ref 200–950)
Monocytes Relative: 6 %
NEUTROS ABS: 5130 {cells}/uL (ref 1500–7800)
Neutrophils Relative %: 57 %
PLATELETS: 329 10*3/uL (ref 140–400)
RBC: 5.22 MIL/uL — ABNORMAL HIGH (ref 3.80–5.10)
RDW: 13.6 % (ref 11.0–15.0)
WBC: 9 10*3/uL (ref 3.8–10.8)

## 2016-01-09 LAB — HEPATIC FUNCTION PANEL
ALBUMIN: 4 g/dL (ref 3.6–5.1)
ALT: 22 U/L (ref 6–29)
AST: 16 U/L (ref 10–35)
Alkaline Phosphatase: 109 U/L (ref 33–130)
BILIRUBIN TOTAL: 0.5 mg/dL (ref 0.2–1.2)
Bilirubin, Direct: 0.1 mg/dL (ref <=0.2)
Indirect Bilirubin: 0.4 mg/dL (ref 0.2–1.2)
Total Protein: 6.8 g/dL (ref 6.1–8.1)

## 2016-01-09 LAB — LIPID PANEL
CHOLESTEROL: 194 mg/dL (ref 125–200)
HDL: 46 mg/dL (ref >=46)
LDL Cholesterol: 87 mg/dL (ref <130)
Total CHOL/HDL Ratio: 4.2 Ratio (ref <=5.0)
Triglycerides: 305 mg/dL — ABNORMAL HIGH (ref <150)
VLDL: 61 mg/dL — ABNORMAL HIGH (ref <30)

## 2016-01-09 LAB — TSH: TSH: 3.3 m[IU]/L

## 2016-02-07 ENCOUNTER — Other Ambulatory Visit: Payer: Self-pay | Admitting: Physician Assistant

## 2016-03-12 ENCOUNTER — Encounter: Payer: Self-pay | Admitting: Internal Medicine

## 2016-03-12 ENCOUNTER — Ambulatory Visit (INDEPENDENT_AMBULATORY_CARE_PROVIDER_SITE_OTHER): Payer: 59 | Admitting: Internal Medicine

## 2016-03-12 VITALS — HR 100 | Temp 98.2°F | Resp 16 | Ht 64.5 in

## 2016-03-12 DIAGNOSIS — J069 Acute upper respiratory infection, unspecified: Secondary | ICD-10-CM

## 2016-03-12 MED ORDER — PREDNISONE 20 MG PO TABS: ORAL_TABLET | ORAL | 0 refills | Status: DC

## 2016-03-12 MED ORDER — FLUTICASONE PROPIONATE 50 MCG/ACT NA SUSP: 2.0000 | Freq: Every day | NASAL | 0 refills | Status: DC

## 2016-03-12 MED ORDER — PROMETHAZINE-PHENYLEPHRINE 6.25-5 MG/5ML PO SYRP: 5.0000 mL | ORAL_SOLUTION | Freq: Four times a day (QID) | ORAL | 0 refills | Status: DC | PRN

## 2016-03-12 MED ORDER — DOXYCYCLINE HYCLATE 100 MG PO CAPS: 100.0000 mg | ORAL_CAPSULE | Freq: Two times a day (BID) | ORAL | 0 refills | Status: DC

## 2016-03-12 NOTE — Progress Notes (Signed)
HPI  Patient presents to the office for evaluation of cough.  It has been going on for 1 weeks.  Patient reports night > day, wet, worse with lying down.  They also endorse change in voice, postnasal drip and DOE, nasal congestion, green nasal sputum, occasional ear pain, sore throat.  .  They have tried mucinex dm, tylenol, xyzal.  They report that nothing has worked.  They admits to other sick contacts.  Her grandson has URI with double ear infections.   Review of Systems  Constitutional: Positive for malaise/fatigue. Negative for chills and fever.  HENT: Positive for congestion, ear pain, hearing loss and sore throat.   Respiratory: Positive for cough. Negative for sputum production, shortness of breath and wheezing.   Cardiovascular: Negative for chest pain, palpitations and leg swelling.  Neurological: Positive for headaches.    PE:  Vitals:   03/12/16 1401  Pulse: 100  Resp: 16  Temp: 98.2 F (36.8 C)    General:  Alert and non-toxic, WDWN, NAD HEENT: NCAT, PERLA, EOM normal, no occular discharge or erythema.  Nasal mucosal edema with sinus tenderness to palpation.  Oropharynx clear with minimal oropharyngeal edema and erythema.  Mucous membranes moist and pink. Neck:  Cervical adenopathy Chest:  RRR no MRGs.  Lungs clear to auscultation A&P with no wheezes rhonchi or rales.   Abdomen: +BS x 4 quadrants, soft, non-tender, no guarding, rigidity, or rebound. Skin: warm and dry no rash Neuro: A&Ox4, CN II-XII grossly intact  Assessment and Plan:   1. Acute URI -cont daily antihistamine -benadryl at bedtime -call if no improvement.  - fluticasone (FLONASE) 50 MCG/ACT nasal spray; Place 2 sprays into both nostrils daily.  Dispense: 16 g; Refill: 0 - predniSONE (DELTASONE) 20 MG tablet; 3 tabs po daily x 3 days, then 2 tabs x 3 days, then 1.5 tabs x 3 days, then 1 tab x 3 days, then 0.5 tabs x 3 days  Dispense: 27 tablet; Refill: 0 - doxycycline (VIBRAMYCIN) 100 MG capsule;  Take 1 capsule (100 mg total) by mouth 2 (two) times daily. One po bid x 7 days  Dispense: 14 capsule; Refill: 0 - promethazine-phenylephrine (PROMETHAZINE-PHENYLEPHRINE) 6.25-5 MG/5ML SYRP; Take 5 mLs by mouth every 6 (six) hours as needed for congestion.  Dispense: 280 mL; Refill: 0

## 2016-04-08 ENCOUNTER — Ambulatory Visit: Payer: Self-pay | Admitting: Internal Medicine

## 2016-04-14 ENCOUNTER — Ambulatory Visit (INDEPENDENT_AMBULATORY_CARE_PROVIDER_SITE_OTHER): Payer: 59 | Admitting: Physician Assistant

## 2016-04-14 ENCOUNTER — Encounter: Payer: Self-pay | Admitting: Physician Assistant

## 2016-04-14 VITALS — BP 140/90 | HR 86 | Temp 97.7°F | Resp 16 | Ht 64.5 in | Wt 182.0 lb

## 2016-04-14 DIAGNOSIS — Z79899 Other long term (current) drug therapy: Secondary | ICD-10-CM

## 2016-04-14 DIAGNOSIS — E6609 Other obesity due to excess calories: Secondary | ICD-10-CM

## 2016-04-14 DIAGNOSIS — E119 Type 2 diabetes mellitus without complications: Secondary | ICD-10-CM

## 2016-04-14 DIAGNOSIS — E559 Vitamin D deficiency, unspecified: Secondary | ICD-10-CM | POA: Diagnosis not present

## 2016-04-14 DIAGNOSIS — E785 Hyperlipidemia, unspecified: Secondary | ICD-10-CM | POA: Diagnosis not present

## 2016-04-14 DIAGNOSIS — I10 Essential (primary) hypertension: Secondary | ICD-10-CM

## 2016-04-14 DIAGNOSIS — J069 Acute upper respiratory infection, unspecified: Secondary | ICD-10-CM | POA: Diagnosis not present

## 2016-04-14 DIAGNOSIS — Z6830 Body mass index (BMI) 30.0-30.9, adult: Secondary | ICD-10-CM

## 2016-04-14 LAB — CBC WITH DIFFERENTIAL/PLATELET
BASOS PCT: 1 %
Basophils Absolute: 70 cells/uL (ref 0–200)
Eosinophils Absolute: 350 cells/uL (ref 15–500)
Eosinophils Relative: 5 %
HCT: 46.2 % — ABNORMAL HIGH (ref 35.0–45.0)
Hemoglobin: 15.1 g/dL (ref 11.7–15.5)
LYMPHS PCT: 37 %
Lymphs Abs: 2590 cells/uL (ref 850–3900)
MCH: 27.8 pg (ref 27.0–33.0)
MCHC: 32.7 g/dL (ref 32.0–36.0)
MCV: 84.9 fL (ref 80.0–100.0)
MONOS PCT: 9 %
MPV: 10.5 fL (ref 7.5–12.5)
Monocytes Absolute: 630 cells/uL (ref 200–950)
Neutro Abs: 3360 cells/uL (ref 1500–7800)
Neutrophils Relative %: 48 %
PLATELETS: 322 10*3/uL (ref 140–400)
RBC: 5.44 MIL/uL — AB (ref 3.80–5.10)
RDW: 13.5 % (ref 11.0–15.0)
WBC: 7 10*3/uL (ref 3.8–10.8)

## 2016-04-14 LAB — TSH: TSH: 3.32 mIU/L

## 2016-04-14 MED ORDER — LISDEXAMFETAMINE DIMESYLATE 40 MG PO CAPS: 40.0000 mg | ORAL_CAPSULE | Freq: Every day | ORAL | 0 refills | Status: DC

## 2016-04-14 MED ORDER — GLIMEPIRIDE 4 MG PO TABS: 4.0000 mg | ORAL_TABLET | Freq: Two times a day (BID) | ORAL | 3 refills | Status: DC

## 2016-04-14 MED ORDER — BENZONATATE 100 MG PO CAPS: 200.0000 mg | ORAL_CAPSULE | Freq: Three times a day (TID) | ORAL | 0 refills | Status: DC | PRN

## 2016-04-14 MED ORDER — VORTIOXETINE HBR 20 MG PO TABS: 20.0000 mg | ORAL_TABLET | Freq: Every day | ORAL | 0 refills | Status: DC

## 2016-04-14 MED ORDER — LEVOCETIRIZINE DIHYDROCHLORIDE 5 MG PO TABS: ORAL_TABLET | ORAL | 3 refills | Status: DC

## 2016-04-14 MED ORDER — AZITHROMYCIN 250 MG PO TABS: ORAL_TABLET | ORAL | 1 refills | Status: AC

## 2016-04-14 NOTE — Progress Notes (Signed)
Assessment and Plan:   Hypertension - monitor blood pressure at home. Continue DASH diet.  Reminder to go to the ER if any CP, SOB, nausea, dizziness, severe HA, changes vision/speech, left arm numbness and tingling and jaw pain.  Cholesterol -Continue diet and exercise. Check cholesterol.   Diabetes without complications -Continue diet and exercise. Check A1C  intolerant to metformin, intolerant GLP, intolerant trulicity Continue onglyza and add on amaryl '4mg'$  BID Refill freestyle lite test strips  Vitamin D Def - check level and continue medications.   Obesity with co morbidities - long discussion about weight loss, diet, and exercise  Depression Remission Continue trintellix PRN  Cough Will hold the zpak and take if she is not getting better, increase fluids, rest, cont allergy pill  Continue diet and meds as discussed. Further disposition pending results of labs. Over 30 minutes of exam, counseling, chart review, and critical decision making was performed  HPI 61 y.o. female  presents for 3 month follow up on hypertension, cholesterol, prediabetes, and vitamin D deficiency.   Her blood pressure has been controlled at home,  today their BP is BP: 140/90  She does workout, has started walking with husky/lab mix and chases 71 month old baby around and will walk with him. She denies chest pain, shortness of breath, dizziness. Mild sore throat, on xyzal. Has had cough with green mucus x 4 days.   She is on cholesterol medication, crestor 1/2 QOD and denies myalgias. Her cholesterol is not at goal. The cholesterol last visit was:   Lab Results  Component Value Date   CHOL 194 01/08/2016   HDL 46 01/08/2016   LDLCALC 87 01/08/2016   TRIG 305 (H) 01/08/2016   CHOLHDL 4.2 01/08/2016    She has been working on diet and exercise for diabetes without complications, she is on ongylza and can tolerate it, unable to tolerate GLP due to yeast,  can not tolerate metformin due to IBS,  trulicity and bydureon she had a skin reaction so she has not been on anything recently, her sugars are going up to 160's in the morning, after food it is 250's, she is on ARB, she is on bASA, and denies paresthesia of the feet, polydipsia, polyuria and visual disturbances. Last A1C in the office was:  Lab Results  Component Value Date   HGBA1C 9.8 (H) 01/08/2016   Has a history of low potassium Lab Results  Component Value Date   CREATININE 0.61 01/08/2016   BUN 17 01/08/2016   NA 136 01/08/2016   K 4.0 01/08/2016   CL 102 01/08/2016   CO2 21 01/08/2016   Patient is on Vitamin D supplement, on 5000 IU.   Lab Results  Component Value Date   VD25OH 36 07/25/2015     She has anxiety/depression, is on xanax rarely 2-3 x a week, switched to trintellix samples from lexapro due to concentration/memory and she is doing well.  She has ADD and is on Vyvanse, but has not been taking it. She was found to have adenomatous rectal polyp with Dr. Hilarie Fredrickson, had partial prioctectomy with Dr. Johney Maine on 07/19/2014 but states no cancer. Will get colonoscopy in 2019.   BMI is Body mass index is 30.76 kg/m., she is working on diet and exercise, normal sleep study. Wt Readings from Last 3 Encounters:  04/14/16 182 lb (82.6 kg)  01/08/16 182 lb 9.6 oz (82.8 kg)  07/25/15 182 lb 12.8 oz (82.9 kg)    Current Medications:  Current Outpatient  Prescriptions on File Prior to Visit  Medication Sig Dispense Refill  . ALPRAZolam (XANAX) 0.5 MG tablet Take 1 tablet (0.5 mg total) by mouth 2 (two) times daily as needed. 60 tablet 0  . amitriptyline (ELAVIL) 50 MG tablet TAKE 1/2-1 TABLET AT NIGHT FOR SLEEP AS NEEDED 90 tablet 1  . aspirin 81 MG tablet Take 81 mg by mouth daily.    . fluticasone (FLONASE) 50 MCG/ACT nasal spray Place 2 sprays into both nostrils daily. 16 g 0  . glucose blood (FREESTYLE LITE) test strip Use as instructed 100 each 12  . glucose monitoring kit (FREESTYLE) monitoring kit 1 each by Does  not apply route as needed for other. Dispense 1 freestyle lyte monitoring kit 1 each 0  . ibuprofen (ADVIL,MOTRIN) 800 MG tablet Take 1 tablet (800 mg total) by mouth every 8 (eight) hours as needed. 90 tablet 1  . levocetirizine (XYZAL) 5 MG tablet TAKE 1 TABLET (5 MG TOTAL) BY MOUTH DAILY. 90 tablet 3  . lisdexamfetamine (VYVANSE) 40 MG capsule Take 1 capsule (40 mg total) by mouth daily. 90 capsule 0  . losartan (COZAAR) 100 MG tablet TAKE 1/2-1 PILL DAILY FOR BLOOD PRESSURE AND KIDNEY PROTECTION FROM DM 90 tablet 0  . olopatadine (PATANOL) 0.1 % ophthalmic solution PLACE 1 DROP INTO BOTH EYES 2 (TWO) TIMES DAILY. 5 mL 1  . promethazine-phenylephrine (PROMETHAZINE-PHENYLEPHRINE) 6.25-5 MG/5ML SYRP Take 5 mLs by mouth every 6 (six) hours as needed for congestion. 280 mL 0  . rosuvastatin (CRESTOR) 20 MG tablet Take 1 tablet (20 mg total) by mouth every other day. 90 tablet 1  . saxagliptin HCl (ONGLYZA) 5 MG TABS tablet Take 1 tablet (5 mg total) by mouth daily. 30 tablet 3  . Saxagliptin-Metformin 5-500 MG TB24 One pill daily for your sugar 30 tablet 3  . vortioxetine HBr (TRINTELLIX) 20 MG TABS Take 20 mg by mouth daily. 90 tablet 0   No current facility-administered medications on file prior to visit.    Medical History:  Past Medical History:  Diagnosis Date  . ADD (attention deficit disorder)   . Anxiety   . Asthma    with severe allergies pt does use albuterol inhaler when needed  . Depression   . Diabetes mellitus without complication ([place]) 6239  . GERD (gastroesophageal reflux disease)    hx of had Nissen Fundoplication 2151 has not had any issues since  . History of hiatal hernia   . Hyperlipidemia 2004  . Hypertension   . IBS (irritable bowel syndrome)    Allergies:  Allergies  Allergen Reactions  . Tape Rash  . Ace Inhibitors Cough  . Metformin And Related     myalgia  . Morphine And Related Itching  . Penicillins     Pt states that in 3rd grade she was given an  injection in the buttock and reacted with a large hive in that area  . Wellbutrin [Bupropion]      Review of Systems:  Review of Systems  Constitutional: Negative.  Negative for chills, diaphoresis and fever.  HENT: Negative.   Eyes: Negative.   Respiratory: Positive for cough. Negative for shortness of breath.   Cardiovascular: Negative.   Gastrointestinal: Negative.   Genitourinary: Negative.   Musculoskeletal: Negative.   Skin: Negative.   Neurological: Negative.   Endo/Heme/Allergies: Negative.   Psychiatric/Behavioral: Negative.     Family history- Review and unchanged Social history- Review and unchanged Physical Exam: BP 140/90   Pulse 86  Temp 97.7 F (36.5 C)   Resp 16   Ht 5' 4.5" (1.638 m)   Wt 182 lb (82.6 kg)   SpO2 98%   BMI 30.76 kg/m  Wt Readings from Last 3 Encounters:  04/14/16 182 lb (82.6 kg)  01/08/16 182 lb 9.6 oz (82.8 kg)  07/25/15 182 lb 12.8 oz (82.9 kg)   General Appearance: Well nourished, in no apparent distress. Eyes: PERRLA, EOMs, conjunctiva no swelling or erythema Sinuses: No Frontal/maxillary tenderness ENT/Mouth: Ext aud canals clear, TMs without erythema, bulging. No erythema, swelling, or exudate on post pharynx.  Tonsils not swollen or erythematous. Hearing normal.  Neck: Supple, thyroid normal.  Respiratory: Respiratory effort normal, BS equal bilaterally without rales, rhonchi, wheezing or stridor.  Cardio: RRR with no MRGs. Brisk peripheral pulses without edema.  Abdomen: Soft, + BS,  Non tender, no guarding, rebound, hernias, masses. Lymphatics: Non tender without lymphadenopathy.  Musculoskeletal: Full ROM, 5/5 strength, Normal gait Skin: Warm, dry without rashes, lesions, ecchymosis.  Neuro: Cranial nerves intact. Normal muscle tone, no cerebellar symptoms. Psych: Awake and oriented X 3, normal affect, Insight and Judgment appropriate.    Vicie Mutters, PA-C 4:40 PM [facility]

## 2016-04-14 NOTE — Patient Instructions (Signed)
Take the amaryl with two largest meals, if you have any low sugars please stop.  Please be careful with glimepiride (Amaryl). This medication forces your blood sugar down no matter what it is starting at. Only take 1/2 of the medication if your sugar is above 120 and you can take a whole pill if your sugar is above 150 in the morning. If at any time you start to have low blood sugars in the morning or during the day please stop this medication. Please never take this medication if you are sick or can not eat. A low blood sugar is much more dangerous than a high blood sugar. Your brain needs two things, sugar and oxygen.      Bad carbs also include fruit juice, alcohol, and sweet tea. These are empty calories that do not signal to your brain that you are full.   Please remember the good carbs are still carbs which convert into sugar. So please measure them out no more than 1/2-1 cup of rice, oatmeal, pasta, and beans  Veggies are however free foods! Pile them on.   Not all fruit is created equal. Please see the list below, the fruit at the bottom is higher in sugars than the fruit at the top. Please avoid all dried fruits.      HOW TO TREAT VIRAL COUGH AND COLD SYMPTOMS:  -Symptoms usually last at least 1 week with the worst symptoms being around day 4.  - colds usually start with a sore throat and end with a cough, and the cough can take 2 weeks to get better.  -No antibiotics are needed for colds, flu, sore throats, cough, bronchitis UNLESS symptoms are longer than 7 days OR if you are getting better then get drastically worse.  -There are a lot of combination medications (Dayquil, Nyquil, Vicks 44, tyelnol cold and sinus, ETC). Please look at the ingredients on the back so that you are treating the correct symptoms and not doubling up on medications/ingredients.   Can do samples steroid inhaler if do not tolerate oral steroids, do 1 puff twice a day and wash mouth out afterwards to avoid  yeast.   Medicines you can use  Nasal congestion  - pseudoephedrine (Sudafed)- behind the counter, do not use if you have high blood pressure, medicine that have -D in them.  - phenylephrine (Sudafed PE) -Dextormethorphan + chlorpheniramine (Coridcidin HBP)- okay if you have high blood pressure -Oxymetazoline (Afrin) nasal spray- LIMIT to 3 days -Saline nasal spray -Neti pot (used distilled or bottled water)  Ear pain/congestion  -pseudoephedrine (sudafed) - Nasonex/flonase nasal spray  Fever  -Acetaminophen (Tyelnol) -Ibuprofen (Advil, motrin, aleve)  Sore Throat  -Acetaminophen (Tyelnol) -Ibuprofen (Advil, motrin, aleve) -Drink a lot of water -Gargle with salt water - Rest your voice (don't talk) -Throat sprays -Cough drops  Body Aches  -Acetaminophen (Tyelnol) -Ibuprofen (Advil, motrin, aleve)  Headache  -Acetaminophen (Tyelnol) -Ibuprofen (Advil, motrin, aleve) - Exedrin, Exedrin Migraine  Allergy symptoms (cough, sneeze, runny nose, itchy eyes) -Claritin or loratadine cheapest but likely the weakest  -Zyrtec or certizine at night because it can make you sleepy -The strongest is allegra or fexafinadine  Cheapest at [employer], [employer], [employer]  Cough  -Dextromethorphan (Delsym)- medicine that has DM in it -Guafenesin (Mucinex/Robitussin) - cough drops - drink lots of water  Chest Congestion  -Guafenesin (Mucinex/Robitussin)  Red Itchy Eyes  - Naphcon-A  Upset Stomach  - Bland diet (nothing spicy, greasy, fried, and high acid foods like tomatoes,  oranges, berries) -OKAY- cereal, bread, soup, crackers, rice -Eat smaller more frequent meals -reduce caffeine, no alcohol -Loperamide (Imodium-AD) if diarrhea -Prevacid for heart burn  General health when sick  -Hydration -wash your hands frequently -keep surfaces clean -change pillow cases and sheets often -Get fresh air but do not exercise strenuously -Vitamin D, double up on it - Vitamin  C -Zinc

## 2016-04-15 LAB — BASIC METABOLIC PANEL WITH GFR
BUN: 11 mg/dL (ref 7–25)
CO2: 18 mmol/L — ABNORMAL LOW (ref 20–31)
Calcium: 9.4 mg/dL (ref 8.6–10.4)
Chloride: 101 mmol/L (ref 98–110)
Creat: 0.82 mg/dL (ref 0.50–1.05)
GFR, EST NON AFRICAN AMERICAN: 79 mL/min (ref >=60)
Glucose, Bld: 351 mg/dL — ABNORMAL HIGH (ref 65–99)
POTASSIUM: 4.3 mmol/L (ref 3.5–5.3)
Sodium: 135 mmol/L (ref 135–146)

## 2016-04-15 LAB — HEPATIC FUNCTION PANEL
ALBUMIN: 4 g/dL (ref 3.6–5.1)
ALK PHOS: 103 U/L (ref 33–130)
ALT: 37 U/L — ABNORMAL HIGH (ref 6–29)
AST: 31 U/L (ref 10–35)
BILIRUBIN TOTAL: 0.5 mg/dL (ref 0.2–1.2)
Bilirubin, Direct: 0.1 mg/dL (ref <=0.2)
Indirect Bilirubin: 0.4 mg/dL (ref 0.2–1.2)
Total Protein: 7.3 g/dL (ref 6.1–8.1)

## 2016-04-15 LAB — LIPID PANEL
CHOLESTEROL: 165 mg/dL (ref <200)
HDL: 47 mg/dL — AB (ref >50)
LDL CALC: 79 mg/dL (ref <100)
TRIGLYCERIDES: 195 mg/dL — AB (ref <150)
Total CHOL/HDL Ratio: 3.5 Ratio (ref <5.0)
VLDL: 39 mg/dL — ABNORMAL HIGH (ref <30)

## 2016-04-15 LAB — HEMOGLOBIN A1C
Hgb A1c MFr Bld: 9.9 % — ABNORMAL HIGH (ref <5.7)
Mean Plasma Glucose: 237 mg/dL

## 2016-04-15 LAB — MAGNESIUM: Magnesium: 2.1 mg/dL (ref 1.5–2.5)

## 2016-04-15 LAB — VITAMIN D 25 HYDROXY (VIT D DEFICIENCY, FRACTURES): Vit D, 25-Hydroxy: 39 ng/mL (ref 30–100)

## 2016-04-27 ENCOUNTER — Other Ambulatory Visit: Payer: Self-pay | Admitting: Physician Assistant

## 2016-04-27 ENCOUNTER — Other Ambulatory Visit: Payer: Self-pay | Admitting: Internal Medicine

## 2016-04-27 DIAGNOSIS — J069 Acute upper respiratory infection, unspecified: Secondary | ICD-10-CM

## 2016-04-28 ENCOUNTER — Ambulatory Visit: Payer: Self-pay | Admitting: Physician Assistant

## 2016-06-08 ENCOUNTER — Other Ambulatory Visit: Payer: Self-pay

## 2016-06-08 MED ORDER — GLUCOSE BLOOD VI STRP: ORAL_STRIP | 3 refills | Status: DC

## 2016-06-09 ENCOUNTER — Other Ambulatory Visit: Payer: Self-pay

## 2016-06-09 DIAGNOSIS — E119 Type 2 diabetes mellitus without complications: Secondary | ICD-10-CM

## 2016-06-09 MED ORDER — GLIMEPIRIDE 4 MG PO TABS: 4.0000 mg | ORAL_TABLET | Freq: Two times a day (BID) | ORAL | 3 refills | Status: DC

## 2016-07-08 ENCOUNTER — Other Ambulatory Visit: Payer: Self-pay | Admitting: Physician Assistant

## 2016-07-10 ENCOUNTER — Other Ambulatory Visit: Payer: Self-pay | Admitting: Physician Assistant

## 2016-07-17 ENCOUNTER — Ambulatory Visit: Payer: Self-pay | Admitting: Physician Assistant

## 2016-07-17 NOTE — Progress Notes (Deleted)
Assessment and Plan:   Hypertension - monitor blood pressure at home. Continue DASH diet.  Reminder to go to the ER if any CP, SOB, nausea, dizziness, severe HA, changes vision/speech, left arm numbness and tingling and jaw pain.  Cholesterol -Continue diet and exercise. Check cholesterol.   Diabetes without complications -Continue diet and exercise. Check A1C  intolerant to metformin, intolerant GLP, intolerant trulicity Will add on DDP4 with metformin, if this does not work can refer endocrine or start insulin Refill freestyle lite test strips  Vitamin D Def - check level and continue medications.   Obesity with co morbidities - long discussion about weight loss, diet, and exercise  Depression Remission Continue trintellix PRN  Insomnia Try amitriptyline at night  Continue diet and meds as discussed. Further disposition pending results of labs. Over 30 minutes of exam, counseling, chart review, and critical decision making was performed  HPI 61 y.o. female  presents for 3 month follow up on hypertension, cholesterol, prediabetes, and vitamin D deficiency.   Her blood pressure has been controlled at home,  today their BP is    She does workout, has started walking with husky/lab mix and chases 3 month old baby around and will walk with him. She denies chest pain, shortness of breath, dizziness. Mild sore throat, on xyzal.   She is on cholesterol medication, crestor 1/2 QOD and denies myalgias. Her cholesterol is not at goal. The cholesterol last visit was:   Lab Results  Component Value Date   CHOL 165 04/14/2016   HDL 47 (L) 04/14/2016   LDLCALC 79 04/14/2016   TRIG 195 (H) 04/14/2016   CHOLHDL 3.5 04/14/2016    She has been working on diet and exercise for diabetes without complications, unable to tolerate GLP due to yeast,  can not tolerate metformin due to IBS, trulicity and bydureon she had a skin reaction so she has not been on anything recently, her sugars are going  up to 160's in the morning, after food it is 250's, she is on ARB, she is on bASA, and denies paresthesia of the feet, polydipsia, polyuria and visual disturbances. Last A1C in the office was:  Lab Results  Component Value Date   HGBA1C 9.9 (H) 04/14/2016   Has a history of low potassium Lab Results  Component Value Date   CREATININE 0.82 04/14/2016   BUN 11 04/14/2016   NA 135 04/14/2016   K 4.3 04/14/2016   CL 101 04/14/2016   CO2 18 (L) 04/14/2016   Patient is on Vitamin D supplement, on 5000 IU.   Lab Results  Component Value Date   VD25OH 39 04/14/2016     She has anxiety/depression, is on xanax rarely 2-3 x a week, switched to trintellix samples from lexapro due to concentration/memory and she is doing well.  She has ADD and is on Vyvanse, but has not been taking it. She was found to have adenomatous rectal polyp with Dr. Hilarie Fredrickson, had partial prioctectomy with Dr. Johney Maine on 07/19/2014 but states no cancer. Will get colonoscopy in 2019.   BMI is There is no height or weight on file to calculate BMI., she is working on diet and exercise, normal sleep study. Wt Readings from Last 3 Encounters:  04/14/16 182 lb (82.6 kg)  01/08/16 182 lb 9.6 oz (82.8 kg)  07/25/15 182 lb 12.8 oz (82.9 kg)    Current Medications:  Current Outpatient Prescriptions on File Prior to Visit  Medication Sig Dispense Refill  . ALPRAZolam Duanne Moron)  0.5 MG tablet Take 1 tablet (0.5 mg total) by mouth 2 (two) times daily as needed. 60 tablet 0  . amitriptyline (ELAVIL) 50 MG tablet TAKE 1/2-1 TABLET AT NIGHT FOR SLEEP AS NEEDED 90 tablet 1  . aspirin 81 MG tablet Take 81 mg by mouth daily.    . benzonatate (TESSALON PERLES) 100 MG capsule Take 2 capsules (200 mg total) by mouth 3 (three) times daily as needed for cough (Max: 68m per day). 60 capsule 0  . fluticasone (FLONASE) 50 MCG/ACT nasal spray PLACE 2 SPRAYS INTO BOTH NOSTRILS DAILY. 48 g 1  . glimepiride (AMARYL) 4 MG tablet Take 1 tablet (4 mg  total) by mouth 2 (two) times daily. 180 tablet 3  . glucose blood (FREESTYLE LITE) test strip Use as instructed 200 each 3  . glucose monitoring kit (FREESTYLE) monitoring kit 1 each by Does not apply route as needed for other. Dispense 1 freestyle lyte monitoring kit 1 each 0  . ibuprofen (ADVIL,MOTRIN) 800 MG tablet Take 1 tablet (800 mg total) by mouth every 8 (eight) hours as needed. 90 tablet 1  . levocetirizine (XYZAL) 5 MG tablet TAKE 1 TABLET (5 MG TOTAL) BY MOUTH DAILY. 90 tablet 3  . levocetirizine (XYZAL) 5 MG tablet TAKE 1 TABLET (5 MG TOTAL) BY MOUTH DAILY. 90 tablet 3  . lisdexamfetamine (VYVANSE) 40 MG capsule Take 1 capsule (40 mg total) by mouth daily. 90 capsule 0  . losartan (COZAAR) 100 MG tablet TAKE 1/2-1 PILL DAILY FOR BLOOD PRESSURE AND KIDNEY PROTECTION FROM DM 90 tablet 0  . olopatadine (PATANOL) 0.1 % ophthalmic solution PLACE 1 DROP INTO BOTH EYES 2 (TWO) TIMES DAILY. 5 mL 1  . promethazine-phenylephrine (PROMETHAZINE-PHENYLEPHRINE) 6.25-5 MG/5ML SYRP Take 5 mLs by mouth every 6 (six) hours as needed for congestion. 280 mL 0  . rosuvastatin (CRESTOR) 20 MG tablet Take 1 tablet (20 mg total) by mouth every other day. 90 tablet 1  . saxagliptin HCl (ONGLYZA) 5 MG TABS tablet Take 1 tablet (5 mg total) by mouth daily. 30 tablet 3  . TRINTELLIX 20 MG TABS TAKE 1 TABLET EVERY DAY 90 tablet 0   No current facility-administered medications on file prior to visit.    Medical History:  Past Medical History:  Diagnosis Date  . ADD (attention deficit disorder)   . Anxiety   . Asthma    with severe allergies pt does use albuterol inhaler when needed  . Depression   . [place] [address] . GERD (gastroesophageal reflux disease)    hx of had Nissen Fundoplication 20938has not had any issues since  . History of hiatal hernia   . Hyperlipidemia 2004  . Hypertension   . IBS (irritable bowel syndrome)    Allergies:  Allergies  Allergen  Reactions  . Tape Rash  . Ace Inhibitors Cough  . Metformin And Related     myalgia  . Morphine And Related Itching  . Penicillins     Pt states that in 3rd grade she was given an injection in the buttock and reacted with a large hive in that area  . Wellbutrin [Bupropion]      Review of Systems:  Review of Systems  Constitutional: Negative.   HENT: Negative.   Eyes: Negative.   Respiratory: Negative.   Cardiovascular: Negative.   Gastrointestinal: Negative.   Genitourinary: Negative.   Musculoskeletal: Negative.   Skin: Negative.   Neurological: Negative.   Endo/Heme/Allergies: Negative.  Psychiatric/Behavioral: Negative.     Family history- Review and unchanged Social history- Review and unchanged Physical Exam: There were no vitals taken for this visit. Wt Readings from Last 3 Encounters:  04/14/16 182 lb (82.6 kg)  01/08/16 182 lb 9.6 oz (82.8 kg)  07/25/15 182 lb 12.8 oz (82.9 kg)   General Appearance: Well nourished, in no apparent distress. Eyes: PERRLA, EOMs, conjunctiva no swelling or erythema Sinuses: No Frontal/maxillary tenderness ENT/Mouth: Ext aud canals clear, TMs without erythema, bulging. No erythema, swelling, or exudate on post pharynx.  Tonsils not swollen or erythematous. Hearing normal.  Neck: Supple, thyroid normal.  Respiratory: Respiratory effort normal, BS equal bilaterally without rales, rhonchi, wheezing or stridor.  Cardio: RRR with no MRGs. Brisk peripheral pulses without edema.  Abdomen: Soft, + BS,  Non tender, no guarding, rebound, hernias, masses. Lymphatics: Non tender without lymphadenopathy.  Musculoskeletal: Full ROM, 5/5 strength, Normal gait Skin: Warm, dry without rashes, lesions, ecchymosis.  Neuro: Cranial nerves intact. Normal muscle tone, no cerebellar symptoms. Psych: Awake and oriented X 3, normal affect, Insight and Judgment appropriate.    Vicie Mutters, PA-C 7:44 AM [facility]

## 2016-08-02 ENCOUNTER — Other Ambulatory Visit: Payer: Self-pay | Admitting: Internal Medicine
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
## 2016-08-06 ENCOUNTER — Ambulatory Visit (INDEPENDENT_AMBULATORY_CARE_PROVIDER_SITE_OTHER): Payer: 59 | Admitting: Physician Assistant

## 2016-08-06 ENCOUNTER — Encounter: Payer: Self-pay | Admitting: Physician Assistant

## 2016-08-06 VITALS — BP 120/88 | HR 85 | Temp 97.7°F | Resp 14 | Ht 64.5 in | Wt 182.2 lb

## 2016-08-06 DIAGNOSIS — Z79899 Other long term (current) drug therapy: Secondary | ICD-10-CM

## 2016-08-06 DIAGNOSIS — E785 Hyperlipidemia, unspecified: Secondary | ICD-10-CM

## 2016-08-06 DIAGNOSIS — I10 Essential (primary) hypertension: Secondary | ICD-10-CM

## 2016-08-06 DIAGNOSIS — F330 Major depressive disorder, recurrent, mild: Secondary | ICD-10-CM | POA: Diagnosis not present

## 2016-08-06 DIAGNOSIS — E119 Type 2 diabetes mellitus without complications: Secondary | ICD-10-CM

## 2016-08-06 DIAGNOSIS — E6609 Other obesity due to excess calories: Secondary | ICD-10-CM | POA: Diagnosis not present

## 2016-08-06 DIAGNOSIS — F988 Other specified behavioral and emotional disorders with onset usually occurring in childhood and adolescence: Secondary | ICD-10-CM

## 2016-08-06 DIAGNOSIS — E559 Vitamin D deficiency, unspecified: Secondary | ICD-10-CM

## 2016-08-06 DIAGNOSIS — Z6830 Body mass index (BMI) 30.0-30.9, adult: Secondary | ICD-10-CM | POA: Diagnosis not present

## 2016-08-06 LAB — CBC WITH DIFFERENTIAL/PLATELET
BASOS PCT: 1 %
Basophils Absolute: 71 cells/uL (ref 0–200)
EOS PCT: 8 %
Eosinophils Absolute: 568 cells/uL — ABNORMAL HIGH (ref 15–500)
HCT: 44.5 % (ref 35.0–45.0)
Hemoglobin: 14.4 g/dL (ref 11.7–15.5)
LYMPHS PCT: 36 %
Lymphs Abs: 2556 cells/uL (ref 850–3900)
MCH: 28.1 pg (ref 27.0–33.0)
MCHC: 32.4 g/dL (ref 32.0–36.0)
MCV: 86.7 fL (ref 80.0–100.0)
MPV: 10.5 fL (ref 7.5–12.5)
Monocytes Absolute: 355 cells/uL (ref 200–950)
Monocytes Relative: 5 %
Neutro Abs: 3550 cells/uL (ref 1500–7800)
Neutrophils Relative %: 50 %
Platelets: 318 10*3/uL (ref 140–400)
RBC: 5.13 MIL/uL — AB (ref 3.80–5.10)
RDW: 13.8 % (ref 11.0–15.0)
WBC: 7.1 10*3/uL (ref 3.8–10.8)

## 2016-08-06 MED ORDER — ESCITALOPRAM OXALATE 20 MG PO TABS: 20.0000 mg | ORAL_TABLET | Freq: Every day | ORAL | 2 refills | Status: DC

## 2016-08-06 MED ORDER — ROSUVASTATIN CALCIUM 20 MG PO TABS: 20.0000 mg | ORAL_TABLET | ORAL | 1 refills | Status: DC

## 2016-08-06 NOTE — Patient Instructions (Signed)
    Bad carbs also include fruit juice, alcohol, and sweet tea. These are empty calories that do not signal to your brain that you are full.   Please remember the good carbs are still carbs which convert into sugar. So please measure them out no more than 1/2-1 cup of rice, oatmeal, pasta, and beans  Veggies are however free foods! Pile them on.   Not all fruit is created equal. Please see the list below, the fruit at the bottom is higher in sugars than the fruit at the top. Please avoid all dried fruits.      Simple math prevails.    1st - exercise does not produce significant weight loss - at best one converts fat into muscle , "bulks up", loses inches, but usually stays "weight neutral"     2nd - think of your body weightas a check book: If you eat more calories than you burn up - you save money or gain weight .... Or if you spend more money than you put in the check book, ie burn up more calories than you eat, then you lose weight     3rd - if you walk or run 1 mile, you burn up 100 calories - you have to burn up 3,500 calories to lose 1 pound, ie you have to walk/run 35 miles to lose 1 measly pound. So if you want to lose 10 #, then you have to walk/run 350 miles, so.... clearly exercise is not the solution.     4. So if you consume 1,500 calories, then you have to burn up the equivalent of 15 miles to stay weight neutral - It also stands to reason that if you consume 1,500 cal/day and don't lose weight, then you must be burning up about 1,500 cals/day to stay weight neutral.     5. If you really want to lose weight, you must cut your calorie intake 300 calories /day and at that rate you should lose about 1 # every 3 days.   6. Please purchase Dr Joel Fuhrman's book(s) "The End of Dieting" & "Eat to Live" . It has some great concepts and recipes.      

## 2016-08-07 LAB — LIPID PANEL
CHOLESTEROL: 156 mg/dL (ref <200)
HDL: 54 mg/dL (ref >50)
LDL Cholesterol: 77 mg/dL (ref <100)
TRIGLYCERIDES: 126 mg/dL (ref <150)
Total CHOL/HDL Ratio: 2.9 Ratio (ref <5.0)
VLDL: 25 mg/dL (ref <30)

## 2016-08-07 LAB — BASIC METABOLIC PANEL WITH GFR
BUN: 14 mg/dL (ref 7–25)
CALCIUM: 9.3 mg/dL (ref 8.6–10.4)
CO2: 27 mmol/L (ref 20–31)
CREATININE: 0.88 mg/dL (ref 0.50–0.99)
Chloride: 103 mmol/L (ref 98–110)
GFR, EST AFRICAN AMERICAN: 83 mL/min (ref >=60)
GFR, EST NON AFRICAN AMERICAN: 72 mL/min (ref >=60)
GLUCOSE: 252 mg/dL — AB (ref 65–99)
Potassium: 4.6 mmol/L (ref 3.5–5.3)
SODIUM: 137 mmol/L (ref 135–146)

## 2016-08-07 LAB — HEPATIC FUNCTION PANEL
ALT: 27 U/L (ref 6–29)
AST: 21 U/L (ref 10–35)
Albumin: 4 g/dL (ref 3.6–5.1)
Alkaline Phosphatase: 92 U/L (ref 33–130)
BILIRUBIN DIRECT: 0.1 mg/dL (ref <=0.2)
BILIRUBIN INDIRECT: 0.3 mg/dL (ref 0.2–1.2)
TOTAL PROTEIN: 7 g/dL (ref 6.1–8.1)
Total Bilirubin: 0.4 mg/dL (ref 0.2–1.2)

## 2016-08-07 LAB — MAGNESIUM: MAGNESIUM: 2.1 mg/dL (ref 1.5–2.5)

## 2016-08-07 LAB — HEMOGLOBIN A1C
Hgb A1c MFr Bld: 7.6 % — ABNORMAL HIGH (ref <5.7)
MEAN PLASMA GLUCOSE: 171 mg/dL

## 2016-08-07 LAB — TSH: TSH: 2.53 m[IU]/L

## 2016-08-26 ENCOUNTER — Encounter: Payer: Self-pay | Admitting: Internal Medicine

## 2016-09-03 ENCOUNTER — Other Ambulatory Visit: Payer: Self-pay | Admitting: Internal Medicine

## 2016-09-15 ENCOUNTER — Other Ambulatory Visit: Payer: Self-pay | Admitting: *Deleted

## 2016-09-15 DIAGNOSIS — F330 Major depressive disorder, recurrent, mild: Secondary | ICD-10-CM

## 2016-09-15 MED ORDER — ESCITALOPRAM OXALATE 20 MG PO TABS: 20.0000 mg | ORAL_TABLET | Freq: Every day | ORAL | 2 refills | Status: DC

## 2016-10-12 ENCOUNTER — Other Ambulatory Visit: Payer: Self-pay

## 2016-10-12 DIAGNOSIS — F330 Major depressive disorder, recurrent, mild: Secondary | ICD-10-CM

## 2016-10-12 MED ORDER — ESCITALOPRAM OXALATE 20 MG PO TABS: 20.0000 mg | ORAL_TABLET | Freq: Every day | ORAL | 0 refills | Status: DC

## 2016-10-13 ENCOUNTER — Other Ambulatory Visit: Payer: Self-pay | Admitting: Physician Assistant

## 2016-10-13 DIAGNOSIS — F330 Major depressive disorder, recurrent, mild: Secondary | ICD-10-CM

## 2016-10-13 MED ORDER — ESCITALOPRAM OXALATE 20 MG PO TABS: 20.0000 mg | ORAL_TABLET | Freq: Every day | ORAL | 0 refills | Status: DC

## 2016-10-13 NOTE — Progress Notes (Signed)
Future Appointments Date Time Provider [place]  11/24/2016 10:30 AM Vicie Mutters, PA-C [id number] None

## 2016-10-14 ENCOUNTER — Other Ambulatory Visit: Payer: Self-pay

## 2016-10-14 DIAGNOSIS — F330 Major depressive disorder, recurrent, mild: Secondary | ICD-10-CM

## 2016-10-14 MED ORDER — ESCITALOPRAM OXALATE 20 MG PO TABS: 20.0000 mg | ORAL_TABLET | Freq: Every day | ORAL | 0 refills | Status: DC

## 2016-11-15 ENCOUNTER — Other Ambulatory Visit: Payer: Self-pay | Admitting: Internal Medicine
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
## 2016-11-24 ENCOUNTER — Encounter: Payer: Self-pay | Admitting: Physician Assistant

## 2016-11-24 ENCOUNTER — Ambulatory Visit (INDEPENDENT_AMBULATORY_CARE_PROVIDER_SITE_OTHER): Payer: 59 | Admitting: Physician Assistant

## 2016-11-24 VITALS — BP 130/80 | HR 105 | Temp 97.5°F | Resp 14 | Ht 64.5 in | Wt 181.4 lb

## 2016-11-24 DIAGNOSIS — E785 Hyperlipidemia, unspecified: Secondary | ICD-10-CM

## 2016-11-24 DIAGNOSIS — I10 Essential (primary) hypertension: Secondary | ICD-10-CM | POA: Diagnosis not present

## 2016-11-24 DIAGNOSIS — Z6830 Body mass index (BMI) 30.0-30.9, adult: Secondary | ICD-10-CM | POA: Diagnosis not present

## 2016-11-24 DIAGNOSIS — E119 Type 2 diabetes mellitus without complications: Secondary | ICD-10-CM | POA: Diagnosis not present

## 2016-11-24 DIAGNOSIS — F988 Other specified behavioral and emotional disorders with onset usually occurring in childhood and adolescence: Secondary | ICD-10-CM

## 2016-11-24 DIAGNOSIS — Z79899 Other long term (current) drug therapy: Secondary | ICD-10-CM

## 2016-11-24 DIAGNOSIS — E6609 Other obesity due to excess calories: Secondary | ICD-10-CM

## 2016-11-24 MED ORDER — LISDEXAMFETAMINE DIMESYLATE 40 MG PO CAPS: 40.0000 mg | ORAL_CAPSULE | Freq: Every day | ORAL | 0 refills | Status: DC

## 2016-11-24 NOTE — Patient Instructions (Signed)
Your A1C is a measure of your sugar over the past 3 months and is not affected by what you have eaten over the past few days. Diabetes increases your chances of stroke and heart attack over 300 % and is the leading cause of blindness and kidney failure in the [place]. Please make sure you decrease bad carbs like white bread, white rice, potatoes, corn, soft drinks, pasta, cereals, refined sugars, sweet tea, dried fruits, and fruit juice. Good carbs are okay to eat in moderation like sweet potatoes, [place] rice, whole grain pasta/bread, most fruit (except dried fruit) and you can eat as many veggies as you want.   Greater than 6.5 is considered diabetic. Between 6.4 and 5.7 is prediabetic If your A1C is less than 5.7 you are NOT diabetic.  Targets for Glucose Readings: Time of Check Target for patients WITHOUT Diabetes Target for DIABETICS  Before Meals Less than 100  less than 150  Two hours after meals Less than 200  Less than 250    Recommendations For Diabetic/Prediabetic Patients:   -  Take medications as prescribed  -  Recommend Dr Fara Olden Fuhrman's book "The End of Diabetes "  And "The End of Dieting"- Can get at  [email or web address] and encourage also get the Audio CD book  - AVOID Animal products, ie. Meat - red/white, Poultry and Dairy/especially cheese - Exercise at least 5 times a week for 30 minutes or preferably daily.  - No Smoking - Drink less than 2 drinks a day.  - Monitor your feet for sores - Have yearly Eye Exams - Recommend annual Flu vaccine  - Recommend Pneumovax and Prevnar vaccines - Shingles Vaccine (Zostavax) if over 86 y.o.  Goals:   - BMI less than 24 - Fasting sugar less than 130 or less than 150 if tapering medicines to lose weight  - Systolic BP less than 680  - Diastolic BP less than 80 - Bad LDL Cholesterol less than 70 - Triglycerides less than 150     Bad carbs also include fruit juice, alcohol, and sweet tea. These are empty calories that do  not signal to your brain that you are full.   Please remember the good carbs are still carbs which convert into sugar. So please measure them out no more than 1/2-1 cup of rice, oatmeal, pasta, and beans  Veggies are however free foods! Pile them on.   Not all fruit is created equal. Please see the list below, the fruit at the bottom is higher in sugars than the fruit at the top. Please avoid all dried fruits.

## 2016-11-25 LAB — HEMOGLOBIN A1C
EAG (MMOL/L): 10.6 (calc)
Hgb A1c MFr Bld: 8.3 % of total Hgb — ABNORMAL HIGH (ref <5.7)
Mean Plasma Glucose: 192 (calc)

## 2016-11-25 LAB — LIPID PANEL
Cholesterol: 153 mg/dL (ref <200)
HDL: 50 mg/dL — ABNORMAL LOW (ref >50)
LDL Cholesterol (Calc): 75 mg/dL (calc)
NON-HDL CHOLESTEROL (CALC): 103 mg/dL (ref <130)
Total CHOL/HDL Ratio: 3.1 (calc) (ref <5.0)
Triglycerides: 186 mg/dL — ABNORMAL HIGH (ref <150)

## 2016-11-25 LAB — CBC WITH DIFFERENTIAL/PLATELET
BASOS ABS: 141 {cells}/uL (ref 0–200)
Basophils Relative: 2.1 %
EOS ABS: 214 {cells}/uL (ref 15–500)
Eosinophils Relative: 3.2 %
HCT: 43.2 % (ref 35.0–45.0)
Hemoglobin: 14.3 g/dL (ref 11.7–15.5)
Lymphs Abs: 2325 cells/uL (ref 850–3900)
MCH: 28.1 pg (ref 27.0–33.0)
MCHC: 33.1 g/dL (ref 32.0–36.0)
MCV: 85 fL (ref 80.0–100.0)
MONOS PCT: 5.3 %
MPV: 11 fL (ref 7.5–12.5)
Neutro Abs: 3665 cells/uL (ref 1500–7800)
Neutrophils Relative %: 54.7 %
PLATELETS: 333 10*3/uL (ref 140–400)
RBC: 5.08 10*6/uL (ref 3.80–5.10)
RDW: 12.4 % (ref 11.0–15.0)
TOTAL LYMPHOCYTE: 34.7 %
WBC mixed population: 355 cells/uL (ref 200–950)
WBC: 6.7 10*3/uL (ref 3.8–10.8)

## 2016-11-25 LAB — HEPATIC FUNCTION PANEL
AG Ratio: 1.4 (calc) (ref 1.0–2.5)
ALT: 26 U/L (ref 6–29)
AST: 18 U/L (ref 10–35)
Albumin: 3.8 g/dL (ref 3.6–5.1)
Alkaline phosphatase (APISO): 104 U/L (ref 33–130)
BILIRUBIN DIRECT: 0.1 mg/dL (ref 0.0–0.2)
BILIRUBIN INDIRECT: 0.5 mg/dL (ref 0.2–1.2)
BILIRUBIN TOTAL: 0.6 mg/dL (ref 0.2–1.2)
Globulin: 2.8 g/dL (calc) (ref 1.9–3.7)
Total Protein: 6.6 g/dL (ref 6.1–8.1)

## 2016-11-25 LAB — BASIC METABOLIC PANEL WITH GFR
BUN: 10 mg/dL (ref 7–25)
CHLORIDE: 101 mmol/L (ref 98–110)
CO2: 24 mmol/L (ref 20–32)
Calcium: 9.1 mg/dL (ref 8.6–10.4)
Creat: 0.86 mg/dL (ref 0.50–0.99)
GFR, EST AFRICAN AMERICAN: 85 mL/min/{1.73_m2} (ref >=60)
GFR, Est Non African American: 73 mL/min/{1.73_m2} (ref >=60)
Glucose, Bld: 294 mg/dL — ABNORMAL HIGH (ref 65–99)
POTASSIUM: 4.2 mmol/L (ref 3.5–5.3)
Sodium: 137 mmol/L (ref 135–146)

## 2016-11-25 LAB — TSH: TSH: 2.75 m[IU]/L (ref 0.40–4.50)

## 2016-11-25 LAB — MAGNESIUM: MAGNESIUM: 1.9 mg/dL (ref 1.5–2.5)

## 2016-12-12 ENCOUNTER — Other Ambulatory Visit: Payer: Self-pay | Admitting: Internal Medicine

## 2017-01-10 ENCOUNTER — Other Ambulatory Visit: Payer: Self-pay | Admitting: Physician Assistant

## 2017-01-10 DIAGNOSIS — F330 Major depressive disorder, recurrent, mild: Secondary | ICD-10-CM

## 2017-01-22 ENCOUNTER — Ambulatory Visit (INDEPENDENT_AMBULATORY_CARE_PROVIDER_SITE_OTHER): Payer: 59

## 2017-01-22 DIAGNOSIS — Z23 Encounter for immunization: Secondary | ICD-10-CM | POA: Diagnosis not present

## 2017-02-10 ENCOUNTER — Other Ambulatory Visit: Payer: Self-pay | Admitting: Internal Medicine

## 2017-03-10 ENCOUNTER — Other Ambulatory Visit: Payer: Self-pay | Admitting: Internal Medicine

## 2017-03-18 ENCOUNTER — Encounter: Payer: Self-pay | Admitting: Physician Assistant

## 2017-03-18 DIAGNOSIS — Z1509 Genetic susceptibility to other malignant neoplasm: Secondary | ICD-10-CM | POA: Insufficient documentation

## 2017-03-25 NOTE — Progress Notes (Signed)
Complete Physical  Assessment and Plan: Essential hypertension - continue medications, DASH diet, exercise and monitor at home. Call if greater than 130/80.  -     CBC with Differential/Platelet -     BASIC METABOLIC PANEL WITH GFR -     Hepatic function panel -     TSH -     Urinalysis, Routine w reflex microscopic -     Microalbumin / creatinine urine ratio -     EKG 12-Lead  Lynch syndrome -     Ambulatory referral to Gynecology - at increased risk for endometrial cancer, needs PAP, will send to GYN to discuss TAH versus sampling/US - follow up GI sooner, has epigastric pain, need EGD and colonoscopy  Diabetes mellitus without complication ([place]) Discussed general issues about diabetes pathophysiology and management., Educational material distributed., Suggested low cholesterol diet., Encouraged aerobic exercise., Discussed foot care., Reminded to get yearly retinal exam. If not better need to discuss insulin -     Hemoglobin A1c  Hyperlipidemia, unspecified hyperlipidemia type -continue medications, check lipids, decrease fatty foods, increase activity.  -     Lipid panel  Medication management -     Magnesium  Vitamin D deficiency Continue supplement  Varicose veins of both lower extremities, unspecified whether complicated - weight loss discussed, continue compression stockings and elevation  Attention deficit disorder, unspecified hyperactivity presence vyvanse refilled  BMI 31.0-31.9,adult  Overweight  - long discussion about weight loss, diet, and exercise -recommended diet heavy in fruits and veggies and low in animal meats, cheeses, and dairy products  Class 1 obesity due to excess calories with serious comorbidity and body mass index (BMI) of 31.0 to 31.9 in adult - follow up 3 months for progress monitoring - increase veggies, decrease carbs - long discussion about weight loss, diet, and exercise  Tooth abscess -     clindamycin (CLEOCIN) 150 MG capsule;  Take 1 capsule (150 mg total) by mouth 4 (four) times daily for 10 days. - follow up dentist   Discussed med's effects and SE's. Screening labs and tests as requested with regular follow-up as recommended. Over 40 minutes of exam, counseling, chart review, and complex, high level critical decision making was performed this visit.   HPI  61 y.o. female  presents for a complete physical.  Her blood pressure has been controlled at home, today their BP is BP: 116/80 She does workout. She denies chest pain, shortness of breath, dizziness.  She is on cholesterol medication, STATIN and denies myalgias. Her cholesterol is not at goal. The cholesterol last visit was:   Lab Results  Component Value Date   CHOL 153 11/24/2016   HDL 50 (L) 11/24/2016   LDLCALC 77 08/06/2016   TRIG 186 (H) 11/24/2016   CHOLHDL 3.1 11/24/2016   She has been working on diet and exercise for diabetes without complications, she is on bASA, she is on ACE/ARB, amaryl '4mg'$  BID,  she does check sugars at home 1-2 x a week has been 120's, no hypoglycemia and denies paresthesia of the feet, polydipsia, polyuria and visual disturbances. Last A1C in the office was:  Lab Results  Component Value Date   HGBA1C 8.3 (H) 11/24/2016   Patient is on Vitamin D supplement.   Lab Results  Component Value Date   VD25OH 39 04/14/2016     She has anxiety/depression, is on xanax PRN and lexapro She has ADD and is on Vyvanse, wants refill today.  Patient has history of lynch syndrome, follows  with Dr. Hilarie Fredrickson, had partial prioctectomy with Dr. Johney Maine on 07/19/2014. They have also advised increase risk of other cancers including endometrial and ovarian cancer, will refer to gyn for possible TAH versus endometiral sampling per guidelines.  BMI is Body mass index is 31.45 kg/m., she is working on diet and exercise. Wt Readings from Last 3 Encounters:  03/26/17 183 lb 3.2 oz (83.1 kg)  11/24/16 181 lb 6.4 oz (82.3 kg)  08/06/16 182 lb 3.2  oz (82.6 kg)      Current Medications:  Current Outpatient Medications on File Prior to Visit  Medication Sig Dispense Refill  . ALPRAZolam (XANAX) 0.5 MG tablet Take 1 tablet (0.5 mg total) by mouth 2 (two) times daily as needed. 60 tablet 0  . aspirin 81 MG tablet Take 81 mg by mouth daily.    Marland Kitchen escitalopram (LEXAPRO) 20 MG tablet TAKE 1 TABLET BY MOUTH EVERY DAY 90 tablet 0  . fluticasone (FLONASE) 50 MCG/ACT nasal spray PLACE 2 SPRAYS INTO BOTH NOSTRILS DAILY. 48 g 1  . glimepiride (AMARYL) 4 MG tablet Take 1 tablet (4 mg total) by mouth 2 (two) times daily. 180 tablet 3  . glucose blood (FREESTYLE LITE) test strip Use as instructed 200 each 3  . glucose monitoring kit (FREESTYLE) monitoring kit 1 each by Does not apply route as needed for other. Dispense 1 freestyle lyte monitoring kit 1 each 0  . ibuprofen (ADVIL,MOTRIN) 800 MG tablet Take 1 tablet (800 mg total) by mouth every 8 (eight) hours as needed. 90 tablet 1  . levocetirizine (XYZAL) 5 MG tablet TAKE 1 TABLET (5 MG TOTAL) BY MOUTH DAILY. 90 tablet 3  . losartan (COZAAR) 100 MG tablet TAKE 1/2-1 TABLET DAILY FOR BLOOD PRESSURE AND KIDNEY PROTECTION FROM DM 90 tablet 1  . olopatadine (PATANOL) 0.1 % ophthalmic solution PLACE 1 DROP INTO BOTH EYES 2 (TWO) TIMES DAILY. 5 mL 1  . rosuvastatin (CRESTOR) 20 MG tablet Take 1 tablet (20 mg total) by mouth every other day. 90 tablet 1   No current facility-administered medications on file prior to visit.    Health Maintenance:   Immunization History  Administered Date(s) Administered  . Influenza Inj Mdck Quad With Preservative 01/22/2017  . Influenza Split 03/08/2014, 01/17/2015  . Influenza, Seasonal, Injecte, Preservative Fre 01/08/2016  . PPD Test 12/28/2013  . Pneumococcal Polysaccharide-23 04/22/2015  . Td 10/21/2005  . Tdap 08/03/2013   Tetanus: 2015 Pneumovax: 2017 Prevnar 13:  Age 63 Flu vaccine: 2018 Zostavax: declines  No LMP recorded. Patient is  postmenopausal. Pap: 12/2013 will refer to GYN MGM: 06/2016 DEXA: Colonoscopy: 05/2014 DUE EGD: DUE Stress test 2007 CXR 2009  Patient Care Team: Unk Pinto, MD as PCP - General (Internal Medicine) Richmond Campbell, MD as Consulting Physician (Gastroenterology) Jannette Spanner, MD as Referring Physician (Dermatology) Rachel Moulds, DO (Internal Medicine) Garald Balding, MD as Consulting Physician (Orthopedic Surgery) Rutherford Guys, MD as Consulting Physician (Ophthalmology) Stanford Breed Denice Bors, MD as Consulting Physician (Cardiology) Michael Boston, MD as Consulting Physician (General Surgery) Pyrtle, Lajuan Lines, MD as Consulting Physician (Gastroenterology)  Medical History:  Past Medical History:  Diagnosis Date  . ADD (attention deficit disorder)   . Anxiety   . Asthma    with severe allergies pt does use albuterol inhaler when needed  . Depression   . Diabetes mellitus without complication ([place]) 4098  . GERD (gastroesophageal reflux disease)    hx of had Nissen Fundoplication 1191 has not had any issues since  .  History of hiatal hernia   . Hyperlipidemia 2004  . Hypertension   . IBS (irritable bowel syndrome)    Allergies Allergies  Allergen Reactions  . Tape Rash  . Ace Inhibitors Cough  . Metformin And Related     myalgia  . Morphine And Related Itching  . Penicillins     Pt states that in 3rd grade she was given an injection in the buttock and reacted with a large hive in that area  . Wellbutrin [Bupropion]     SURGICAL HISTORY She  has a past surgical history that includes Hernia repair; Cholecystectomy; Tubal ligation; Cosmetic surgery; Rotator cuff repair (Right, 2012); Eye surgery (2006); Nissen fundoplication (0383); Carpal tunnel release (1998, 2001); Nasal sinus surgery; colonscopy ; Upper gi endoscopy; and Partial proctectomy by tem (N/A, 07/19/2014).   FAMILY HISTORY Her family history includes Breast cancer in her mother and sister; Cancer in  her father, mother, paternal grandfather, paternal grandmother, and sister; Cancer (age of onset: 31) in her brother; Colon cancer (age of onset: 50) in her brother; Colon cancer (age of onset: 32) in her paternal grandfather; Colon cancer (age of onset: 54) in her father; Diabetes in her mother; Hyperlipidemia in her father and mother; Hypertension in her father and mother.   SOCIAL HISTORY She  reports that  has never smoked. she has never used smokeless tobacco. She reports that she does not drink alcohol or use drugs.  Review of Systems: Review of Systems  Constitutional: Negative.   HENT: Negative.   Eyes: Negative.   Respiratory: Negative.   Cardiovascular: Negative.   Gastrointestinal: Negative.   Genitourinary: Negative.   Musculoskeletal: Negative.   Skin: Negative.   Neurological: Negative.   Endo/Heme/Allergies: Negative.   Psychiatric/Behavioral: Negative.     Physical Exam: Estimated body mass index is 31.45 kg/m as calculated from the following:   Height as of this encounter: _0  (1.626 m).   Weight as of this encounter: 183 lb 3.2 oz (83.1 kg). BP 116/80   Pulse 94   Temp 97.7 F (36.5 C)   Resp 16   Ht _1  (1.626 m)   Wt 183 lb 3.2 oz (83.1 kg)   SpO2 95%   BMI 31.45 kg/m  General Appearance: Well nourished, in no apparent distress.  Eyes: PERRLA, EOMs, conjunctiva no swelling or erythema, normal fundi and vessels.  Sinuses: No Frontal/maxillary tenderness  ENT/Mouth: Ext aud canals clear, normal light reflex with TMs without erythema, bulging. Okay dentition but some erythema, swelling left lower molar.  No erythema, swelling, or exudate on post pharynx. Tonsils not swollen or erythematous. Hearing normal.  Neck: Supple, thyroid normal. No bruits  Respiratory: Respiratory effort normal, BS equal bilaterally without rales, rhonchi, wheezing or stridor.  Cardio: RRR without murmurs, rubs or gallops. Brisk peripheral pulses without edema.  Chest:  symmetric, with normal excursions and percussion.  Breasts: Symmetric, without lumps, nipple discharge, retractions.  Abdomen: Soft, + epigastric tenderness, no guarding, rebound, hernias, masses, or organomegaly.  Lymphatics: Non tender without lymphadenopathy.  Genitourinary: defer Musculoskeletal: Full ROM all peripheral extremities,5/5 strength, and normal gait.  Skin: Warm, dry without rashes, lesions, ecchymosis. Neuro: Cranial nerves intact, reflexes equal bilaterally. Normal muscle tone, no cerebellar symptoms. Sensation intact.  Psych: Awake and oriented X 3, normal affect, Insight and Judgment appropriate.   EKG: declines AORTA SCAN: defer  Vicie Mutters 11:12 AM [facility]

## 2017-03-26 ENCOUNTER — Other Ambulatory Visit: Payer: Self-pay

## 2017-03-26 ENCOUNTER — Ambulatory Visit (INDEPENDENT_AMBULATORY_CARE_PROVIDER_SITE_OTHER): Payer: 59 | Admitting: Physician Assistant

## 2017-03-26 ENCOUNTER — Encounter: Payer: Self-pay | Admitting: Physician Assistant

## 2017-03-26 VITALS — BP 116/80 | HR 94 | Temp 97.7°F | Resp 16 | Ht 64.0 in | Wt 183.2 lb

## 2017-03-26 DIAGNOSIS — Z79899 Other long term (current) drug therapy: Secondary | ICD-10-CM

## 2017-03-26 DIAGNOSIS — Z136 Encounter for screening for cardiovascular disorders: Secondary | ICD-10-CM

## 2017-03-26 DIAGNOSIS — E6609 Other obesity due to excess calories: Secondary | ICD-10-CM

## 2017-03-26 DIAGNOSIS — Z0000 Encounter for general adult medical examination without abnormal findings: Secondary | ICD-10-CM | POA: Diagnosis not present

## 2017-03-26 DIAGNOSIS — F988 Other specified behavioral and emotional disorders with onset usually occurring in childhood and adolescence: Secondary | ICD-10-CM

## 2017-03-26 DIAGNOSIS — E785 Hyperlipidemia, unspecified: Secondary | ICD-10-CM

## 2017-03-26 DIAGNOSIS — E119 Type 2 diabetes mellitus without complications: Secondary | ICD-10-CM

## 2017-03-26 DIAGNOSIS — Z1509 Genetic susceptibility to other malignant neoplasm: Secondary | ICD-10-CM

## 2017-03-26 DIAGNOSIS — Z15068 Genetic susceptibility to other malignant neoplasm of digestive system: Secondary | ICD-10-CM

## 2017-03-26 DIAGNOSIS — E559 Vitamin D deficiency, unspecified: Secondary | ICD-10-CM

## 2017-03-26 DIAGNOSIS — K047 Periapical abscess without sinus: Secondary | ICD-10-CM

## 2017-03-26 DIAGNOSIS — I8393 Asymptomatic varicose veins of bilateral lower extremities: Secondary | ICD-10-CM

## 2017-03-26 DIAGNOSIS — I10 Essential (primary) hypertension: Secondary | ICD-10-CM

## 2017-03-26 DIAGNOSIS — Z6831 Body mass index (BMI) 31.0-31.9, adult: Secondary | ICD-10-CM

## 2017-03-26 MED ORDER — CLINDAMYCIN HCL 150 MG PO CAPS: 150.0000 mg | ORAL_CAPSULE | Freq: Four times a day (QID) | ORAL | 0 refills | Status: AC

## 2017-03-26 MED ORDER — AMITRIPTYLINE HCL 50 MG PO TABS: ORAL_TABLET | ORAL | 0 refills | Status: DC

## 2017-03-26 MED ORDER — LISDEXAMFETAMINE DIMESYLATE 40 MG PO CAPS: 40.0000 mg | ORAL_CAPSULE | Freq: Every day | ORAL | 0 refills | Status: DC

## 2017-03-26 NOTE — Patient Instructions (Addendum)
Will refer to GYN Suggest calling your GI doctor for colonoscopy/ EGD   If sugar is not better need to discuss insulin  Your A1C is a measure of your sugar over the past 3 months and is not affected by what you have eaten over the past few days. Diabetes increases your chances of stroke and heart attack over 300 % and is the leading cause of blindness and kidney failure in the [place]. Please make sure you decrease bad carbs like white bread, white rice, potatoes, corn, soft drinks, pasta, cereals, refined sugars, sweet tea, dried fruits, and fruit juice. Good carbs are okay to eat in moderation like sweet potatoes, [place] rice, whole grain pasta/bread, most fruit (except dried fruit) and you can eat as many veggies as you want.   Greater than 6.5 is considered diabetic. Between 6.4 and 5.7 is prediabetic If your A1C is less than 5.7 you are NOT diabetic.  Targets for Glucose Readings: Time of Check Target for patients WITHOUT Diabetes Target for DIABETICS  Before Meals Less than 100  less than 150  Two hours after meals Less than 200  Less than 250      Bad carbs also include fruit juice, alcohol, and sweet tea. These are empty calories that do not signal to your brain that you are full.   Please remember the good carbs are still carbs which convert into sugar. So please measure them out no more than 1/2-1 cup of rice, oatmeal, pasta, and beans  Veggies are however free foods! Pile them on.   Not all fruit is created equal. Please see the list below, the fruit at the bottom is higher in sugars than the fruit at the top. Please avoid all dried fruits.

## 2017-03-27 LAB — URINALYSIS, ROUTINE W REFLEX MICROSCOPIC
BILIRUBIN URINE: NEGATIVE
Hgb urine dipstick: NEGATIVE
Hyaline Cast: NONE SEEN /LPF
KETONES UR: NEGATIVE
NITRITE: NEGATIVE
PROTEIN: NEGATIVE
Specific Gravity, Urine: 1.024 (ref 1.001–1.03)
pH: 5.5 (ref 5.0–8.0)

## 2017-03-27 LAB — HEPATIC FUNCTION PANEL
AG RATIO: 1.3 (calc) (ref 1.0–2.5)
ALBUMIN MSPROF: 3.9 g/dL (ref 3.6–5.1)
ALT: 22 U/L (ref 6–29)
AST: 16 U/L (ref 10–35)
Alkaline phosphatase (APISO): 90 U/L (ref 33–130)
BILIRUBIN DIRECT: 0.1 mg/dL (ref 0.0–0.2)
BILIRUBIN INDIRECT: 0.6 mg/dL (ref 0.2–1.2)
BILIRUBIN TOTAL: 0.7 mg/dL (ref 0.2–1.2)
GLOBULIN: 2.9 g/dL (ref 1.9–3.7)
Total Protein: 6.8 g/dL (ref 6.1–8.1)

## 2017-03-27 LAB — CBC WITH DIFFERENTIAL/PLATELET
BASOS PCT: 1.5 %
Basophils Absolute: 102 cells/uL (ref 0–200)
EOS ABS: 279 {cells}/uL (ref 15–500)
Eosinophils Relative: 4.1 %
HEMATOCRIT: 42.8 % (ref 35.0–45.0)
Hemoglobin: 14.1 g/dL (ref 11.7–15.5)
LYMPHS ABS: 2693 {cells}/uL (ref 850–3900)
MCH: 27.5 pg (ref 27.0–33.0)
MCHC: 32.9 g/dL (ref 32.0–36.0)
MCV: 83.4 fL (ref 80.0–100.0)
MPV: 10.5 fL (ref 7.5–12.5)
Monocytes Relative: 6.5 %
NEUTROS PCT: 48.3 %
Neutro Abs: 3284 cells/uL (ref 1500–7800)
PLATELETS: 325 10*3/uL (ref 140–400)
RBC: 5.13 10*6/uL — ABNORMAL HIGH (ref 3.80–5.10)
RDW: 12.1 % (ref 11.0–15.0)
TOTAL LYMPHOCYTE: 39.6 %
WBC: 6.8 10*3/uL (ref 3.8–10.8)
WBCMIX: 442 {cells}/uL (ref 200–950)

## 2017-03-27 LAB — LIPID PANEL
CHOL/HDL RATIO: 3 (calc) (ref <5.0)
Cholesterol: 166 mg/dL (ref <200)
HDL: 56 mg/dL (ref >50)
LDL CHOLESTEROL (CALC): 92 mg/dL
NON-HDL CHOLESTEROL (CALC): 110 mg/dL (ref <130)
TRIGLYCERIDES: 90 mg/dL (ref <150)

## 2017-03-27 LAB — BASIC METABOLIC PANEL WITH GFR
BUN: 12 mg/dL (ref 7–25)
CHLORIDE: 102 mmol/L (ref 98–110)
CO2: 28 mmol/L (ref 20–32)
Calcium: 9.4 mg/dL (ref 8.6–10.4)
Creat: 0.73 mg/dL (ref 0.50–0.99)
GFR, EST AFRICAN AMERICAN: 104 mL/min/{1.73_m2} (ref >=60)
GFR, EST NON AFRICAN AMERICAN: 90 mL/min/{1.73_m2} (ref >=60)
Glucose, Bld: 159 mg/dL — ABNORMAL HIGH (ref 65–99)
Potassium: 4.1 mmol/L (ref 3.5–5.3)
Sodium: 138 mmol/L (ref 135–146)

## 2017-03-27 LAB — HEMOGLOBIN A1C
HEMOGLOBIN A1C: 9.4 %{Hb} — AB (ref <5.7)
MEAN PLASMA GLUCOSE: 223 (calc)
eAG (mmol/L): 12.4 (calc)

## 2017-03-27 LAB — TSH: TSH: 2.17 mIU/L (ref 0.40–4.50)

## 2017-03-27 LAB — MICROALBUMIN / CREATININE URINE RATIO
Creatinine, Urine: 134 mg/dL (ref 20–275)
Microalb Creat Ratio: 4 mcg/mg creat (ref <30)
Microalb, Ur: 0.6 mg/dL

## 2017-03-27 LAB — MAGNESIUM: MAGNESIUM: 1.9 mg/dL (ref 1.5–2.5)

## 2017-03-29 ENCOUNTER — Telehealth: Payer: Self-pay | Admitting: *Deleted

## 2017-03-29 NOTE — Telephone Encounter (Signed)
Dr Hilarie Fredrickson has received faxed note from [facility] (see 03/17/17 note under "media" tab in Epic). Dr Hilarie Fredrickson notes, "patient with history of colon polyp and now genetics eval consistent with lynch syndrome. Needs office visit to discuss colonoscopy with will need to be annual and also to discuss EGD. JMP"  I have left a voicemail for patient to call back.

## 2017-04-02 ENCOUNTER — Encounter: Payer: Self-pay | Admitting: Internal Medicine

## 2017-04-05 NOTE — Telephone Encounter (Signed)
Left voice mail to call back 

## 2017-04-07 NOTE — Telephone Encounter (Signed)
Patient scheduled for office visit on 04/12/17 with Dr Hilarie Fredrickson at 3:00 pm. She verbalizes understanding.

## 2017-04-08 ENCOUNTER — Encounter: Payer: Self-pay | Admitting: *Deleted

## 2017-04-12 ENCOUNTER — Telehealth: Payer: Self-pay | Admitting: *Deleted

## 2017-04-12 ENCOUNTER — Encounter: Payer: Self-pay | Admitting: Internal Medicine

## 2017-04-12 ENCOUNTER — Ambulatory Visit (INDEPENDENT_AMBULATORY_CARE_PROVIDER_SITE_OTHER): Payer: 59 | Admitting: Internal Medicine

## 2017-04-12 VITALS — BP 140/90 | HR 102 | Ht 64.0 in | Wt 183.0 lb

## 2017-04-12 DIAGNOSIS — Z86010 Personal history of colonic polyps: Secondary | ICD-10-CM

## 2017-04-12 DIAGNOSIS — Z1509 Genetic susceptibility to other malignant neoplasm: Secondary | ICD-10-CM | POA: Diagnosis not present

## 2017-04-12 MED ORDER — SUPREP BOWEL PREP KIT 17.5-3.13-1.6 GM/177ML PO SOLN: 1.0000 | ORAL | 0 refills | Status: DC

## 2017-04-12 NOTE — Patient Instructions (Signed)
You have been scheduled for an endoscopy and colonoscopy. Please follow the written instructions given to you at your visit today. Please pick up your prep supplies at the pharmacy within the next 1-3 days. If you use inhalers (even only as needed), please bring them with you on the day of your procedure. Your physician has requested that you go to www.startemmi.com and enter the access code given to you at your visit today. This web site gives a general overview about your procedure. However, you should still follow specific instructions given to you by our office regarding your preparation for the procedure.  If you are age 65 or older, your body mass index should be between 23-30. Your Body mass index is 31.41 kg/m. If this is out of the aforementioned range listed, please consider follow up with your Primary Care Provider.  If you are age 9 or younger, your body mass index should be between 19-25. Your Body mass index is 31.41 kg/m. If this is out of the aformentioned range listed, please consider follow up with your Primary Care Provider.

## 2017-04-12 NOTE — Telephone Encounter (Signed)
Called and left the patient a message to call the office back. Need to give the patient the new patient appt for January 16th at 12:15pm

## 2017-04-13 ENCOUNTER — Encounter: Payer: Self-pay | Admitting: Internal Medicine

## 2017-04-13 NOTE — Progress Notes (Signed)
Patient ID: Sydney Dickson, female   DOB: May 01, 1955, 62 y.o.   MRN: [id number] HPI: Sydney Dickson is a 62 year old female with a history of adenomatous colon polyps, family history of colon cancer and a recent diagnosis of Lynch syndrome by genetic analysis who is seen to further discuss Lynch syndrome diagnosis.  She also has a history of diabetes, GERD with hiatal hernia, hypertension, hyperlipidemia and IBS.  She is known to me from colonoscopy which was performed on 05/28/2014 for family history of colon cancer in her grandfather, father and brother.  She previously had colonoscopy in 2010 with Dr. Earlean Shawl.  This colonoscopy revealed a 3 mm cecal polyp which was removed and a semi-pedunculated 3 cm polyp growing in the distal rectum near the dentate line.  This was biopsied and she was referred for transanal excision which was performed by Dr. Johney Maine on 07/19/2014.  This revealed a large tubular adenoma with no evidence of high-grade dysplasia.  There was a transmural defect with all margins negative.  Recently after genetic analysis and diagnosis of Lynch syndrome and her daughter, she was diagnosed with Lynch syndrome with a [place] 2 gene mutation.  She denies GI complaint today.  She does have a history of GERD but denies heartburn.  She is not on acid suppression therapy currently.  She denies abdominal pain.  Denies diarrhea and constipation though she does typically have only 2-3 stools per week.  No change in her bowel habits.  Denies blood in her stool or melena.  Denies dysphagia and odynophagia.  Past Medical History:  Diagnosis Date  . ADD (attention deficit disorder)   . Anxiety   . Asthma    with severe allergies pt does use albuterol inhaler when needed  . Depression   . Diabetes mellitus without complication ([place]) 4259  . GERD (gastroesophageal reflux disease)    hx of had Nissen Fundoplication 5638 has not had any issues since  . History of hiatal hernia   . Hyperlipidemia 2004  .  Hypertension   . IBS (irritable bowel syndrome)   . Lynch syndrome   . Tubular adenoma of colon     Past Surgical History:  Procedure Laterality Date  . [place], 2001  . CHOLECYSTECTOMY    . colonscopy     . COSMETIC SURGERY     rhinoplasty  . EYE SURGERY  2006   lasik  . HERNIA REPAIR    . NASAL SINUS SURGERY    . NISSEN FUNDOPLICATION  7564  . PARTIAL PROCTECTOMY BY TEM N/A 07/19/2014   Procedure: PARTIAL PROCTECTOMY BY TEM;  Surgeon: Michael Boston, MD;  Location: [facility];  Service: General;  Laterality: N/A;  . ROTATOR CUFF REPAIR Right 2012  . TUBAL LIGATION    . UPPER GI ENDOSCOPY      Outpatient Medications Prior to Visit  Medication Sig Dispense Refill  . ALPRAZolam (XANAX) 0.5 MG tablet Take 1 tablet (0.5 mg total) by mouth 2 (two) times daily as needed. 60 tablet 0  . amitriptyline (ELAVIL) 50 MG tablet TAKE 1/2-1 TABLET AT NIGHT FOR SLEEP AS NEEDED 90 tablet 0  . aspirin 81 MG tablet Take 81 mg by mouth daily.    Marland Kitchen escitalopram (LEXAPRO) 20 MG tablet TAKE 1 TABLET BY MOUTH EVERY DAY 90 tablet 0  . fluticasone (FLONASE) 50 MCG/ACT nasal spray PLACE 2 SPRAYS INTO BOTH NOSTRILS DAILY. 48 g 1  . glimepiride (AMARYL) 4 MG tablet Take 1 tablet (4 mg  total) by mouth 2 (two) times daily. 180 tablet 3  . glucose blood (FREESTYLE LITE) test strip Use as instructed 200 each 3  . glucose monitoring kit (FREESTYLE) monitoring kit 1 each by Does not apply route as needed for other. Dispense 1 freestyle lyte monitoring kit 1 each 0  . ibuprofen (ADVIL,MOTRIN) 800 MG tablet Take 1 tablet (800 mg total) by mouth every 8 (eight) hours as needed. 90 tablet 1  . levocetirizine (XYZAL) 5 MG tablet TAKE 1 TABLET (5 MG TOTAL) BY MOUTH DAILY. 90 tablet 3  . lisdexamfetamine (VYVANSE) 40 MG capsule Take 1 capsule (40 mg total) by mouth daily. 90 capsule 0  . losartan (COZAAR) 100 MG tablet TAKE 1/2-1 TABLET DAILY FOR BLOOD PRESSURE AND KIDNEY PROTECTION FROM DM 90 tablet 1  .  olopatadine (PATANOL) 0.1 % ophthalmic solution PLACE 1 DROP INTO BOTH EYES 2 (TWO) TIMES DAILY. 5 mL 1  . rosuvastatin (CRESTOR) 20 MG tablet Take 1 tablet (20 mg total) by mouth every other day. 90 tablet 1   No facility-administered medications prior to visit.     Allergies  Allergen Reactions  . Tape Rash  . Ace Inhibitors Cough  . Metformin And Related     myalgia  . Morphine And Related Itching  . Penicillins     Pt states that in 3rd grade she was given an injection in the buttock and reacted with a large hive in that area  . Wellbutrin [Bupropion]     Felt funny    Family History  Problem Relation Age of Onset  . Cancer Mother        breast  . Hyperlipidemia Mother   . Hypertension Mother   . Diabetes Mother   . Breast cancer Mother   . Cancer Father        breast, colon, skin, prostate  . Hyperlipidemia Father   . Hypertension Father   . Colon cancer Father 34  . Cancer Sister        breast  . Breast cancer Sister   . Cancer Brother 19       colon  . Colon cancer Brother 25  . Cancer Paternal Grandmother        ovary  . Cancer Paternal Grandfather        colon  . Colon cancer Paternal Grandfather 24  . Stomach cancer Neg Hx   . Esophageal cancer Neg Hx     Social History   Tobacco Use  . Smoking status: Never Smoker  . Smokeless tobacco: Never Used  Substance Use Topics  . Alcohol use: No  . Drug use: No    ROS: As per history of present illness, otherwise negative  BP 140/90   Pulse (!) 102   Ht _0  (1.626 m)   Wt 183 lb (83 kg)   BMI 31.41 kg/m  Constitutional: Well-developed and well-nourished. No distress. HEENT: Normocephalic and atraumatic. No scleral icterus. Neck: Neck supple. Trachea midline. Cardiovascular: Normal rate, regular rhythm and intact distal pulses. No M/R/G Pulmonary/chest: Effort normal and breath sounds normal. No wheezing, rales or rhonchi. Abdominal: Soft, nontender, nondistended. Bowel sounds active  throughout. There are no masses palpable. No hepatosplenomegaly. Extremities: no clubbing, cyanosis, or edema Neurological: Alert and oriented to person place and time. Skin: Skin is warm and dry.  Psychiatric: Normal mood and affect. Behavior is normal.  RELEVANT LABS AND IMAGING: CBC    Component Value Date/Time   WBC 6.8 03/26/2017 1201  RBC 5.13 (H) 03/26/2017 1201   HGB 14.1 03/26/2017 1201   HCT 42.8 03/26/2017 1201   PLT 325 03/26/2017 1201   MCV 83.4 03/26/2017 1201   MCH 27.5 03/26/2017 1201   MCHC 32.9 03/26/2017 1201   RDW 12.1 03/26/2017 1201   LYMPHSABS 2,693 03/26/2017 1201   MONOABS 355 08/06/2016 1059   EOSABS 279 03/26/2017 1201   BASOSABS 102 03/26/2017 1201    CMP     Component Value Date/Time   NA 138 03/26/2017 1201   K 4.1 03/26/2017 1201   CL 102 03/26/2017 1201   CO2 28 03/26/2017 1201   GLUCOSE 159 (H) 03/26/2017 1201   BUN 12 03/26/2017 1201   CREATININE 0.73 03/26/2017 1201   CALCIUM 9.4 03/26/2017 1201   PROT 6.8 03/26/2017 1201   ALBUMIN 4.0 08/06/2016 1059   AST 16 03/26/2017 1201   ALT 22 03/26/2017 1201   ALKPHOS 92 08/06/2016 1059   BILITOT 0.7 03/26/2017 1201   GFRNONAA 90 03/26/2017 1201   GFRAA 104 03/26/2017 1201   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  1.  Lynch syndrome/family history of colon cancer/personal history of advanced adenomatous colon polyps --we discussed the Lynch syndrome diagnosis today and her [facility] 2 gene mutation.  She is receiving the indicated screening tests and has GYN visit pending given increased risk of endometrial and ovarian cancer. --I have recommended annual colonoscopy for her and she is due at this time.  We discussed the risks, benefits and alternatives and she is  agreeable to proceed --I recommended upper endoscopy for gastric cancer screening.  We will also need to exclude H. pylori.  Consider repeat EGD every 2-5 years. --We discussed pancreatic cancer screening, but she does not have a family history of pancreatic cancer in any first-degree relative.  For this reason we are not pursuing cross-sectional imaging for pancreas screening at this time.  We can discuss this in the future and certainly if her family history changes. --I recommended that her daughter with Lynch syndrome begin the necessary screening as soon as possible.  She also has a son who has not had genetic testing and I recommended that he have genetic testing and colonoscopy.  25 minutes spent with the patient today. Greater than 50% was spent in counseling and coordination of care with the patient   VO:HYWVPXT, [place], [place], [place] [id number]

## 2017-04-21 ENCOUNTER — Encounter: Payer: Self-pay | Admitting: Gynecologic Oncology

## 2017-04-21 ENCOUNTER — Inpatient Hospital Stay: Payer: 59 | Attending: Gynecologic Oncology | Admitting: Gynecologic Oncology

## 2017-04-21 VITALS — BP 134/87 | HR 106 | Temp 98.3°F | Resp 20 | Wt 180.2 lb

## 2017-04-21 DIAGNOSIS — Z803 Family history of malignant neoplasm of breast: Secondary | ICD-10-CM | POA: Insufficient documentation

## 2017-04-21 DIAGNOSIS — Z1509 Genetic susceptibility to other malignant neoplasm: Secondary | ICD-10-CM | POA: Insufficient documentation

## 2017-04-21 DIAGNOSIS — Z8042 Family history of malignant neoplasm of prostate: Secondary | ICD-10-CM | POA: Insufficient documentation

## 2017-04-21 DIAGNOSIS — Z800 Family history of malignant neoplasm of digestive organs: Secondary | ICD-10-CM | POA: Diagnosis not present

## 2017-04-21 DIAGNOSIS — Z808 Family history of malignant neoplasm of other organs or systems: Secondary | ICD-10-CM | POA: Insufficient documentation

## 2017-04-21 NOTE — Patient Instructions (Addendum)
Preparing for your Surgery  Plan for surgery on May 27, 2017 with Dr. Everitt Amber at [facility] will be scheduled for a robotic assisted total hysterectomy, bilateral salpingo-oophorectomy.  Pre-operative Testing -You will receive a phone call from presurgical testing at [facility] to arrange for a pre-operative testing appointment before your surgery.  This appointment normally occurs one to two weeks before your scheduled surgery.   -Bring your insurance card, copy of an advanced directive if applicable, medication list  -At that visit, you will be asked to sign a consent for a possible blood transfusion in case a transfusion becomes necessary during surgery.  The need for a blood transfusion is rare but having consent is a necessary part of your care.     -You should not be taking blood thinners or aspirin at least ten days prior to surgery unless instructed by your surgeon.  Day Before Surgery at [place] will be asked to take in a light diet the day before surgery.  Avoid carbonated beverages.  You will be advised to have nothing to eat or drink after midnight the evening before.    Eat a light diet the day before surgery.  Examples including soups, broths, toast, yogurt, mashed potatoes.  Things to avoid include carbonated beverages (fizzy beverages), raw fruits and raw vegetables, or beans.   If your bowels are filled with gas, your surgeon will have difficulty visualizing your pelvic organs which increases your surgical risks.  Your role in recovery Your role is to become active as soon as directed by your doctor, while still giving yourself time to heal.  Rest when you feel tired. You will be asked to do the following in order to speed your recovery:  - Cough and breathe deeply. This helps toclear and expand your lungs and can prevent pneumonia. You may be given a spirometer to practice deep breathing. A staff member will show you how to  use the spirometer. - Do mild physical activity. Walking or moving your legs help your circulation and body functions return to normal. A staff member will help you when you try to walk and will provide you with simple exercises. Do not try to get up or walk alone the first time. - Actively manage your pain. Managing your pain lets you move in comfort. We will ask you to rate your pain on a scale of zero to 10. It is your responsibility to tell your doctor or nurse where and how much you hurt so your pain can be treated.  Special Considerations -If you are diabetic, you may be placed on insulin after surgery to have closer control over your blood sugars to promote healing and recovery.  This does not mean that you will be discharged on insulin.  If applicable, your oral antidiabetics will be resumed when you are tolerating a solid diet.  -Your final pathology results from surgery should be available by the Friday after surgery and the results will be relayed to you when available.  -Dr. Lahoma Crocker is the Surgeon that assists your GYN Oncologist with surgery.  The next day after your surgery you will either see your GYN Oncologist or Dr. Lahoma Crocker.   Blood Transfusion Information WHAT IS A BLOOD TRANSFUSION? A transfusion is the replacement of blood or some of its parts. Blood is made up of multiple cells which provide different functions.  Red blood cells carry oxygen and are used for blood loss replacement.  White  blood cells fight against infection.  Platelets control bleeding.  Plasma helps clot blood.  Other blood products are available for specialized needs, such as hemophilia or other clotting disorders. BEFORE THE TRANSFUSION  Who gives blood for transfusions?   You may be able to donate blood to be used at a later date on yourself (autologous donation).  Relatives can be asked to donate blood. This is generally not any safer than if you have received blood from  a stranger. The same precautions are taken to ensure safety when a relative's blood is donated.  Healthy volunteers who are fully evaluated to make sure their blood is safe. This is blood bank blood. Transfusion therapy is the safest it has ever been in the practice of medicine. Before blood is taken from a donor, a complete history is taken to make sure that person has no history of diseases nor engages in risky social behavior (examples are intravenous drug use or sexual activity with multiple partners). The donor's travel history is screened to minimize risk of transmitting infections, such as malaria. The donated blood is tested for signs of infectious diseases, such as HIV and hepatitis. The blood is then tested to be sure it is compatible with you in order to minimize the chance of a transfusion reaction. If you or a relative donates blood, this is often done in anticipation of surgery and is not appropriate for emergency situations. It takes many days to process the donated blood. RISKS AND COMPLICATIONS Although transfusion therapy is very safe and saves many lives, the main dangers of transfusion include:   Getting an infectious disease.  Developing a transfusion reaction. This is an allergic reaction to something in the blood you were given. Every precaution is taken to prevent this. The decision to have a blood transfusion has been considered carefully by your caregiver before blood is given. Blood is not given unless the benefits outweigh the risks.  Total Laparoscopic Hysterectomy A total laparoscopic hysterectomy is a minimally invasive surgery to remove your uterus and cervix. This surgery is performed by making several small cuts (incisions) in your abdomen. It can also be done with a thin, lighted tube (laparoscope) inserted into two small incisions in your lower abdomen. Your fallopian tubes and ovaries can be removed (bilateral salpingo-oophorectomy) during this surgery as  well.Benefits of minimally invasive surgery include:  Less pain.  Less risk of blood loss.  Less risk of infection.  Quicker return to normal activities.  Tell a health care provider about:  Any allergies you have.  All medicines you are taking, including vitamins, herbs, eye drops, creams, and over-the-counter medicines.  Any problems you or family members have had with anesthetic medicines.  Any blood disorders you have.  Any surgeries you have had.  Any medical conditions you have. What are the risks? Generally, this is a safe procedure. However, as with any procedure, complications can occur. Possible complications include:  Bleeding.  Blood clots in the legs or lung.  Infection.  Injury to surrounding organs.  Problems with anesthesia.  Early menopause symptoms (hot flashes, night sweats, insomnia).  Risk of conversion to an open abdominal incision.  What happens before the procedure?  Ask your health care provider about changing or stopping your regular medicines.  Do not take aspirin or blood thinners (anticoagulants) for 1 week before the surgery or as told by your health care provider.  Do not eat or drink anything for 8 hours before the surgery or as told by  your health care provider.  Quit smoking if you smoke.  Arrange for a ride home after surgery and for someone to help you at home during recovery. What happens during the procedure?  You will be given antibiotic medicine.  An IV tube will be placed in your arm. You will be given medicine to make you sleep (general anesthetic).  A gas (carbon dioxide) will be used to inflate your abdomen. This will allow your surgeon to look inside your abdomen, perform your surgery, and treat any other problems found if necessary.  Three or four small incisions (often less than 1/2 inch) will be made in your abdomen. One of these incisions will be made in the area of your belly button (navel). The laparoscope  will be inserted into the incision. Your surgeon will look through the laparoscope while doing your procedure.  Other surgical instruments will be inserted through the other incisions.  Your uterus may be removed through your vagina or cut into small pieces and removed through the small incisions.  Your incisions will be closed. What happens after the procedure?  The gas will be released from inside your abdomen.  You will be taken to the recovery area where a nurse will watch and check your progress. Once you are awake, stable, and taking fluids well, without other problems, you will return to your room or be allowed to go home.  There is usually minimal discomfort following the surgery because the incisions are so small.  You will be given pain medicine while you are in the hospital and for when you go home. This information is not intended to replace advice given to you by your health care provider. Make sure you discuss any questions you have with your health care provider. Document Released: 01/18/2007 Document Revised: 08/29/2015 Document Reviewed: 10/11/2012 Elsevier Interactive Patient Education  2017 [place]. Total Laparoscopic Hysterectomy, Care After Refer to this sheet in the next few weeks. These instructions provide you with information on caring for yourself after your procedure. Your health care provider may also give you more specific instructions. Your treatment has been planned according to current medical practices, but problems sometimes occur. Call your health care provider if you have any problems or questions after your procedure. What can I expect after the procedure?  Pain and bruising at the incision sites. You will be given pain medicine to control it.  Menopausal symptoms such as hot flashes, night sweats, and insomnia if your ovaries were removed.  Sore throat from the breathing tube that was inserted during surgery. Follow these instructions at  home:  Only take over-the-counter or prescription medicines for pain, discomfort, or fever as directed by your health care provider.  Do not take aspirin. It can cause bleeding.  Do not drive when taking pain medicine.  Follow your health care provider's advice regarding diet, exercise, lifting, driving, and general activities.  Resume your usual diet as directed and allowed.  Get plenty of rest and sleep.  Do not douche, use tampons, or have sexual intercourse for at least 6 weeks, or until your health care provider gives you permission.  Change your bandages (dressings) as directed by your health care provider.  Monitor your temperature and notify your health care provider of a fever.  Take showers instead of baths for 2-3 weeks.  Do not drink alcohol until your health care provider gives you permission.  If you develop constipation, you may take a mild laxative with your health care provider's permission.  Bran foods may help with constipation problems. Drinking enough fluids to keep your urine clear or pale yellow may help as well.  Try to have someone home with you for 1-2 weeks to help around the house.  Keep all of your follow-up appointments as directed by your health care provider. Contact a health care provider if:  You have swelling, redness, or increasing pain around your incision sites.  You have pus coming from your incision.  You notice a bad smell coming from your incision.  Your incision breaks open.  You feel dizzy or lightheaded.  You have pain or bleeding when you urinate.  You have persistent diarrhea.  You have persistent nausea and vomiting.  You have abnormal vaginal discharge.  You have a rash.  You have any type of abnormal reaction or develop an allergy to your medicine.  You have poor pain control with your prescribed medicine. Get help right away if:  You have chest pain or shortness of breath.  You have severe abdominal pain that  is not relieved with pain medicine.  You have pain or swelling in your legs. This information is not intended to replace advice given to you by your health care provider. Make sure you discuss any questions you have with your health care provider. Document Released: 01/11/2013 Document Revised: 08/29/2015 Document Reviewed: 10/11/2012 Elsevier Interactive Patient Education  2017 [employer].

## 2017-04-21 NOTE — Progress Notes (Signed)
Consult Note: Gyn-Oncology New Patient Consultation  Sydney Dickson 62 y.o. female  CC:  Chief Complaint  Patient presents with  . Lynch syndrome    HPI: Patient is seen today in consultation at the request of Vicie Mutters.  Primary care physician Dr. Unk Pinto. Consulting MDs: Richmond Campbell, Amy Gilford Silvius Pyrtle.  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  Please see her family history below.  She has 2 children.  She is a 96 year old son who has not been tested as well as a 81 year old daughter who has been tested and is positive.  Her daughter does have a son who is 47-1/2 years old.  Her daughter is getting all of her GYN issues set up. The patient is otherwise in her usual state of health.  She does have a upper endoscopy and colonoscopy scheduled for February 15.  Her mammograms are up-to-date.  She has not had urine cytology as far she is aware of.  Her last Pap smear was in December 2017.  Recently they have all been normal.  In the distant past she did have an abnormal Pap but upon repeat normalized.  She has had 2 spontaneous vaginal deliveries.  Her only abdominal surgeries include a laparoscopic Nissen fundoplication in 1025 and a tubal ligation.  Review of Systems: Constitutional: Denies fever. Skin: No rash Cardiovascular: No chest pain, shortness of breath, or edema  Pulmonary: No cough  Gastro Intestinal: No nausea, vomiting, constipation, or diarrhea reported. Genitourinary:  Denies vaginal bleeding and discharge.  Musculoskeletal: No joint pain.  Psychology: No complaints, some stress related to family and helping care for her elderly father who has dementia.  Current Meds:  Outpatient Encounter Medications as of 04/21/2017  Medication Sig  . ALPRAZolam (XANAX) 0.5 MG tablet Take 1 tablet (0.5 mg total) by mouth 2 (two) times daily as needed.  Marland Kitchen amitriptyline (ELAVIL) 50 MG  tablet TAKE 1/2-1 TABLET AT NIGHT FOR SLEEP AS NEEDED  . aspirin 81 MG tablet Take 81 mg by mouth daily.  Marland Kitchen escitalopram (LEXAPRO) 20 MG tablet TAKE 1 TABLET BY MOUTH EVERY DAY  . fluticasone (FLONASE) 50 MCG/ACT nasal spray PLACE 2 SPRAYS INTO BOTH NOSTRILS DAILY.  Marland Kitchen glimepiride (AMARYL) 4 MG tablet Take 1 tablet (4 mg total) by mouth 2 (two) times daily.  Marland Kitchen glucose blood (FREESTYLE LITE) test strip Use as instructed  . glucose monitoring kit (FREESTYLE) monitoring kit 1 each by Does not apply route as needed for other. Dispense 1 freestyle lyte monitoring kit  . ibuprofen (ADVIL,MOTRIN) 800 MG tablet Take 1 tablet (800 mg total) by mouth every 8 (eight) hours as needed.  Marland Kitchen levocetirizine (XYZAL) 5 MG tablet TAKE 1 TABLET (5 MG TOTAL) BY MOUTH DAILY.  Marland Kitchen lisdexamfetamine (VYVANSE) 40 MG capsule Take 1 capsule (40 mg total) by mouth daily.  Marland Kitchen losartan (COZAAR) 100 MG tablet TAKE 1/2-1 TABLET DAILY FOR BLOOD PRESSURE AND KIDNEY PROTECTION FROM DM  . olopatadine (PATANOL) 0.1 % ophthalmic solution PLACE 1 DROP INTO BOTH EYES 2 (TWO) TIMES DAILY.  . rosuvastatin (CRESTOR) 20 MG tablet Take 1 tablet (20 mg total) by mouth every other day.  Manus Gunning BOWEL PREP KIT 17.5-3.13-1.6 GM/177ML SOLN Take 1 kit by mouth as directed.   No facility-administered encounter medications on file as of 04/21/2017.     Allergy:  Allergies  Allergen Reactions  . Tape Rash  . Ace  Inhibitors Cough  . Metformin And Related     myalgia  . Morphine And Related Itching  . Penicillins     Pt states that in 3rd grade she was given an injection in the buttock and reacted with a large hive in that area  . Wellbutrin [Bupropion]     Felt funny    Social Hx:   Social History   Socioeconomic History  . Marital status: Married    Spouse name: Not on file  . Number of children: Not on file  . Years of education: Not on file  . Highest education level: Not on file  Social Needs  . Financial resource strain: Not on  file  . Food insecurity - worry: Not on file  . Food insecurity - inability: Not on file  . Transportation needs - medical: Not on file  . Transportation needs - non-medical: Not on file  Occupational History  . Not on file  Tobacco Use  . Smoking status: Never Smoker  . Smokeless tobacco: Never Used  Substance and Sexual Activity  . Alcohol use: No  . Drug use: No  . Sexual activity: Not on file  Other Topics Concern  . Not on file  Social History Narrative  . Not on file    Past Surgical Hx:  Past Surgical History:  Procedure Laterality Date  . [place], 2001  . CHOLECYSTECTOMY    . colonscopy     . COSMETIC SURGERY     rhinoplasty  . EYE SURGERY  2006   lasik  . HERNIA REPAIR    . NASAL SINUS SURGERY    . NISSEN FUNDOPLICATION  9390  . PARTIAL PROCTECTOMY BY TEM N/A 07/19/2014   Procedure: PARTIAL PROCTECTOMY BY TEM;  Surgeon: Michael Boston, MD;  Location: [facility] ORS;  Service: General;  Laterality: N/A;  . ROTATOR CUFF REPAIR Right 2012  . TUBAL LIGATION    . UPPER GI ENDOSCOPY      Past Medical Hx:  Past Medical History:  Diagnosis Date  . ADD (attention deficit disorder)   . Anxiety   . Asthma    with severe allergies pt does use albuterol inhaler when needed  . Depression   . Diabetes mellitus without complication ([place]) 3009  . GERD (gastroesophageal reflux disease)    hx of had Nissen Fundoplication 2330 has not had any issues since  . History of hiatal hernia   . Hyperlipidemia 2004  . Hypertension   . IBS (irritable bowel syndrome)   . Lynch syndrome   . Tubular adenoma of colon     Oncology Hx:   No history exists.    Family Hx:  Family History  Problem Relation Age of Onset  . Cancer Mother        breast  . Hyperlipidemia Mother   . Hypertension Mother   . Diabetes Mother   . Breast cancer Mother   . Cancer Father        breast, colon, skin, prostate  . Hyperlipidemia Father   . Hypertension Father   . Colon cancer  Father 23  . Cancer Sister        breast  . Breast cancer Sister   . Cancer Brother 47       colon  . Colon cancer Brother 63  . Cancer Paternal Grandmother        cervical and ? uterine  . Cancer Paternal Grandfather        colon  .  Colon cancer Paternal Grandfather 67  . Stomach cancer Neg Hx   . Esophageal cancer Neg Hx     Vitals:  Blood pressure 134/87, pulse (!) 106, temperature 98.3 F (36.8 C), temperature source Oral, resp. rate 20, weight 180 lb 3.2 oz (81.7 kg), SpO2 96 %.  Physical Exam: Well-nourished well-dressed female in no acute distress.   Neck: Supple, no lymphadenopathy, no thyromegaly.   Lungs: Clear to auscultation bilaterally  Cardiac: Regular rate and rhythm.  Abdomen: Well-healed laparoscopic skin incisions.  She has mid epigastric incision, 2 incisions surrounding the umbilicus as well as other minimally invasive incisions.  There is no evidence of any incisional hernias.  Abdomen is soft, nontender, nondistended.  There are no appreciable masses.  There is no hepatospleno megaly.  Groins: No lymphadenopathy.  Extremities: No edema.  Pelvic: External genitalia within normal limits.  The vagina is atrophic.  The cervix is multiparous.  There are no visible lesions.  There is no discharge.  Bimanual examination the uterus is of normal size shape and consistency.  There are no adnexal masses.  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  We discussed that she has a increased lifetime risk of developing both an ovarian cancer as well as an endometrial cancer.  Her risk of developing endometrial cancer is 25-60% and her risk of developing ovarian cancer is 4-12%.  At her age we would recommend definitive surgery with a hysterectomy and removal of the fallopian tubes and ovaries.  She very much wishes to proceed.  I believe that we can perform her surgery in a minimally invasive fashion.  Risks of surgery including but not limited to bleeding,  infection, injury to surrounding organs and thromboembolic disease were discussed with the patient.  She would like to have a colonoscopy first and will come in for her surgery on February 21.  She knows that Dr. Denman George will be performing her surgery that day.  Her questions were elicited and answered to her satisfaction.  We did discuss the need to continue with her colon and stomach screening.  We also discussed the need for annual urine cytology to rule out upper GU tract malignancies.  I discussed that her risk of this is much lower but is anywhere from 1-7%.  Other malignancies that she may be at increased risk for include small bowel, brain, and pancreatic.  However the penetrance of her family's mutation seems to be primarily with colon cancer.  We appreciate the opportunity to partner in the care of this very pleasant patient.  Garritt Molyneux A., MD 04/21/2017, 1:05 PM

## 2017-05-11 ENCOUNTER — Encounter: Payer: Self-pay | Admitting: Internal Medicine

## 2017-05-18 NOTE — Progress Notes (Signed)
EKG 03-26-17 epic

## 2017-05-18 NOTE — Patient Instructions (Signed)
Sydney Dickson  05/18/2017   Your procedure is scheduled on: 05-27-17   Report to [facility] Main  Entrance   Follow signs to Short Stay on first floor at 530 AM    Call this number if you have problems the morning of surgery [phone]     Remember: Eat a light diet the day before surgery.  Examples including soups, broths, toast, yogurt, mashed potatoes.  Things to avoid include carbonated beverages (fizzy beverages), raw fruits and raw vegetables, or beans. If your bowels are filled with gas, your surgeon will have difficulty visualizing your pelvic organs which increases your surgical risks.  Do not eat food or drink liquids :After Midnight.     Take these medicines the morning of surgery with A SIP OF WATER: escitalopram(lexapro), levocetirizine(xyzal), xanax if needed,nasal spray if needed                                You may not have any metal on your body including hair pins and              piercings  Do not wear jewelry, make-up, lotions, powders or perfumes, deodorant             Do not wear nail polish.  Do not shave  48 hours prior to surgery.     Do not bring valuables to the hospital. [place].  Contacts, dentures or bridgework may not be worn into surgery.  Leave suitcase in the car. After surgery it may be brought to your room.                Please read over the following fact sheets you were given: _____________________________________________________________________            How to Manage Your Diabetes Before and After Surgery  Why is it important to control my blood sugar before and after surgery? . Improving blood sugar levels before and after surgery helps healing and can limit problems. . A way of improving blood sugar control is eating a healthy diet by: o  Eating less sugar and carbohydrates o  Increasing activity/exercise o  Talking with your doctor about reaching  your blood sugar goals . High blood sugars (greater than 180 mg/dL) can raise your risk of infections and slow your recovery, so you will need to focus on controlling your diabetes during the weeks before surgery. . Make sure that the doctor who takes care of your diabetes knows about your planned surgery including the date and location.  How do I manage my blood sugar before surgery? . Check your blood sugar at least 4 times a day, starting 2 days before surgery, to make sure that the level is not too high or low. o Check your blood sugar the morning of your surgery when you wake up and every 2 hours until you get to the Short Stay unit. . If your blood sugar is less than 70 mg/dL, you will need to treat for low blood sugar: o Do not take insulin. o Treat a low blood sugar (less than 70 mg/dL) with  cup of clear juice (cranberry or apple), 4 glucose tablets, OR glucose gel. o Recheck blood sugar in 15 minutes  after treatment (to make sure it is greater than 70 mg/dL). If your blood sugar is not greater than 70 mg/dL on recheck, call [phone] for further instructions. . Report your blood sugar to the short stay nurse when you get to Short Stay.  . If you are admitted to the hospital after surgery: o Your blood sugar will be checked by the staff and you will probably be given insulin after surgery (instead of oral diabetes medicines) to make sure you have good blood sugar levels. o The goal for blood sugar control after surgery is 80-180 mg/dL.   WHAT DO I DO ABOUT MY DIABETES MEDICATION?   . THE DAY BEFORE SURGERY, take GLIMEPIRIDE  normally      . THE MORNING OF SURGERY, DO NOT TAKE ANY DIABETIC MEDICATIONS!!   Patient Signature:  Date:   Nurse Signature:  Date:   Reviewed and Endorsed by [facility] Patient Education Committee, August 2015    [facility] - Preparing for Surgery Before surgery, you can play an important role.  Because skin is not sterile, your skin needs to  be as free of germs as possible.  You can reduce the number of germs on your skin by washing with CHG (chlorahexidine gluconate) soap before surgery.  CHG is an antiseptic cleaner which kills germs and bonds with the skin to continue killing germs even after washing. Please DO NOT use if you have an allergy to CHG or antibacterial soaps.  If your skin becomes reddened/irritated stop using the CHG and inform your nurse when you arrive at Short Stay. Do not shave (including legs and underarms) for at least 48 hours prior to the first CHG shower.  You may shave your face/neck. Please follow these instructions carefully:  1.  Shower with CHG Soap the night before surgery and the  morning of Surgery.  2.  If you choose to wash your hair, wash your hair first as usual with your  normal  shampoo.  3.  After you shampoo, rinse your hair and body thoroughly to remove the  shampoo.                           4.  Use CHG as you would any other liquid soap.  You can apply chg directly  to the skin and wash                       Gently with a scrungie or clean washcloth.  5.  Apply the CHG Soap to your body ONLY FROM THE NECK DOWN.   Do not use on face/ open                           Wound or open sores. Avoid contact with eyes, ears mouth and genitals (private parts).                       Wash face,  Genitals (private parts) with your normal soap.             6.  Wash thoroughly, paying special attention to the area where your surgery  will be performed.  7.  Thoroughly rinse your body with warm water from the neck down.  8.  DO NOT shower/wash with your normal soap after using and rinsing off  the CHG Soap.  9.  Pat yourself dry with a clean towel.            10.  Wear clean pajamas.            11.  Place clean sheets on your bed the night of your first shower and do not  sleep with pets. Day of Surgery : Do not apply any lotions/deodorants the morning of surgery.  Please wear clean clothes to  the hospital/surgery center.  FAILURE TO FOLLOW THESE INSTRUCTIONS MAY RESULT IN THE CANCELLATION OF YOUR SURGERY PATIENT SIGNATURE_________________________________  NURSE SIGNATURE__________________________________  ________________________________________________________________________   Adam Phenix  An incentive spirometer is a tool that can help keep your lungs clear and active. This tool measures how well you are filling your lungs with each breath. Taking long deep breaths may help reverse or decrease the chance of developing breathing (pulmonary) problems (especially infection) following:  A long period of time when you are unable to move or be active. BEFORE THE PROCEDURE   If the spirometer includes an indicator to show your best effort, your nurse or respiratory therapist will set it to a desired goal.  If possible, sit up straight or lean slightly forward. Try not to slouch.  Hold the incentive spirometer in an upright position. INSTRUCTIONS FOR USE  1. Sit on the edge of your bed if possible, or sit up as far as you can in bed or on a chair. 2. Hold the incentive spirometer in an upright position. 3. Breathe out normally. 4. Place the mouthpiece in your mouth and seal your lips tightly around it. 5. Breathe in slowly and as deeply as possible, raising the piston or the ball toward the top of the column. 6. Hold your breath for 3-5 seconds or for as long as possible. Allow the piston or ball to fall to the bottom of the column. 7. Remove the mouthpiece from your mouth and breathe out normally. 8. Rest for a few seconds and repeat Steps 1 through 7 at least 10 times every 1-2 hours when you are awake. Take your time and take a few normal breaths between deep breaths. 9. The spirometer may include an indicator to show your best effort. Use the indicator as a goal to work toward during each repetition. 10. After each set of 10 deep breaths, practice coughing to be  sure your lungs are clear. If you have an incision (the cut made at the time of surgery), support your incision when coughing by placing a pillow or rolled up towels firmly against it. Once you are able to get out of bed, walk around indoors and cough well. You may stop using the incentive spirometer when instructed by your caregiver.  RISKS AND COMPLICATIONS  Take your time so you do not get dizzy or light-headed.  If you are in pain, you may need to take or ask for pain medication before doing incentive spirometry. It is harder to take a deep breath if you are having pain. AFTER USE  Rest and breathe slowly and easily.  It can be helpful to keep track of a log of your progress. Your caregiver can provide you with a simple table to help with this. If you are using the spirometer at home, follow these instructions: [place] IF:   You are having difficultly using the spirometer.  You have trouble using the spirometer as often as instructed.  Your pain medication is not giving enough relief while using the spirometer.  You  develop fever of 100.5 F (38.1 C) or higher. SEEK IMMEDIATE MEDICAL CARE IF:   You cough up bloody sputum that had not been present before.  You develop fever of 102 F (38.9 C) or greater.  You develop worsening pain at or near the incision site. MAKE SURE YOU:   Understand these instructions.  Will watch your condition.  Will get help right away if you are not doing well or get worse. Document Released: 08/03/2006 Document Revised: 06/15/2011 Document Reviewed: 10/04/2006 [employer] Patient Information 2014 [employer], [place].   ________________________________________________________________________  WHAT IS A BLOOD TRANSFUSION? Blood Transfusion Information  A transfusion is the replacement of blood or some of its parts. Blood is made up of multiple cells which provide different functions.  Red blood cells carry oxygen and are used for blood  loss replacement.  White blood cells fight against infection.  Platelets control bleeding.  Plasma helps clot blood.  Other blood products are available for specialized needs, such as hemophilia or other clotting disorders. BEFORE THE TRANSFUSION  Who gives blood for transfusions?   Healthy volunteers who are fully evaluated to make sure their blood is safe. This is blood bank blood. Transfusion therapy is the safest it has ever been in the practice of medicine. Before blood is taken from a donor, a complete history is taken to make sure that person has no history of diseases nor engages in risky social behavior (examples are intravenous drug use or sexual activity with multiple partners). The donor's travel history is screened to minimize risk of transmitting infections, such as malaria. The donated blood is tested for signs of infectious diseases, such as HIV and hepatitis. The blood is then tested to be sure it is compatible with you in order to minimize the chance of a transfusion reaction. If you or a relative donates blood, this is often done in anticipation of surgery and is not appropriate for emergency situations. It takes many days to process the donated blood. RISKS AND COMPLICATIONS Although transfusion therapy is very safe and saves many lives, the main dangers of transfusion include:   Getting an infectious disease.  Developing a transfusion reaction. This is an allergic reaction to something in the blood you were given. Every precaution is taken to prevent this. The decision to have a blood transfusion has been considered carefully by your caregiver before blood is given. Blood is not given unless the benefits outweigh the risks. AFTER THE TRANSFUSION  Right after receiving a blood transfusion, you will usually feel much better and more energetic. This is especially true if your red blood cells have gotten low (anemic). The transfusion raises the level of the red blood cells  which carry oxygen, and this usually causes an energy increase.  The nurse administering the transfusion will monitor you carefully for complications. HOME CARE INSTRUCTIONS  No special instructions are needed after a transfusion. You may find your energy is better. Speak with your caregiver about any limitations on activity for underlying diseases you may have. SEEK MEDICAL CARE IF:   Your condition is not improving after your transfusion.  You develop redness or irritation at the intravenous (IV) site. SEEK IMMEDIATE MEDICAL CARE IF:  Any of the following symptoms occur over the next 12 hours:  Shaking chills.  You have a temperature by mouth above 102 F (38.9 C), not controlled by medicine.  Chest, back, or muscle pain.  People around you feel you are not acting correctly or are confused.  Shortness of  breath or difficulty breathing.  Dizziness and fainting.  You get a rash or develop hives.  You have a decrease in urine output.  Your urine turns a dark color or changes to pink, red, or Obst. Any of the following symptoms occur over the next 10 days:  You have a temperature by mouth above 102 F (38.9 C), not controlled by medicine.  Shortness of breath.  Weakness after normal activity.  The white part of the eye turns yellow (jaundice).  You have a decrease in the amount of urine or are urinating less often.  Your urine turns a dark color or changes to pink, red, or [place]. Document Released: 03/20/2000 Document Revised: 06/15/2011 Document Reviewed: 11/07/2007 [facility] Patient Information 2014 [place], [place].  _______________________________________________________________________

## 2017-05-20 ENCOUNTER — Encounter (HOSPITAL_COMMUNITY)
Admission: RE | Admit: 2017-05-20 | Discharge: 2017-05-20 | Disposition: A | Discharge status: Home / Self Care | Payer: 59 | Source: Ambulatory Visit | Attending: Gynecologic Oncology | Admitting: Gynecologic Oncology

## 2017-05-20 ENCOUNTER — Other Ambulatory Visit: Payer: Self-pay

## 2017-05-20 ENCOUNTER — Encounter (HOSPITAL_COMMUNITY): Payer: Self-pay

## 2017-05-20 DIAGNOSIS — Z01812 Encounter for preprocedural laboratory examination: Secondary | ICD-10-CM | POA: Diagnosis not present

## 2017-05-20 DIAGNOSIS — Z1509 Genetic susceptibility to other malignant neoplasm: Secondary | ICD-10-CM | POA: Insufficient documentation

## 2017-05-20 LAB — CBC
HEMATOCRIT: 44.2 % (ref 36.0–46.0)
HEMOGLOBIN: 14.9 g/dL (ref 12.0–15.0)
MCH: 28.7 pg (ref 26.0–34.0)
MCHC: 33.7 g/dL (ref 30.0–36.0)
MCV: 85.2 fL (ref 78.0–100.0)
Platelets: 308 10*3/uL (ref 150–400)
RBC: 5.19 MIL/uL — AB (ref 3.87–5.11)
RDW: 13 % (ref 11.5–15.5)
WBC: 10.9 10*3/uL — ABNORMAL HIGH (ref 4.0–10.5)

## 2017-05-20 LAB — URINALYSIS, ROUTINE W REFLEX MICROSCOPIC
BACTERIA UA: NONE SEEN
BILIRUBIN URINE: NEGATIVE
Glucose, UA: >=500 mg/dL — AB
HGB URINE DIPSTICK: NEGATIVE
Ketones, ur: NEGATIVE mg/dL
LEUKOCYTES UA: NEGATIVE
NITRITE: NEGATIVE
PROTEIN: NEGATIVE mg/dL
RBC / HPF: NONE SEEN RBC/hpf (ref 0–5)
SPECIFIC GRAVITY, URINE: 1.005 (ref 1.005–1.030)
Squamous Epithelial / LPF: NONE SEEN
pH: 6 (ref 5.0–8.0)

## 2017-05-20 LAB — COMPREHENSIVE METABOLIC PANEL
ALBUMIN: 4.1 g/dL (ref 3.5–5.0)
ALK PHOS: 100 U/L (ref 38–126)
ALT: 23 U/L (ref 14–54)
ANION GAP: 10 (ref 5–15)
AST: 22 U/L (ref 15–41)
BILIRUBIN TOTAL: 0.5 mg/dL (ref 0.3–1.2)
BUN: 13 mg/dL (ref 6–20)
CALCIUM: 9.6 mg/dL (ref 8.9–10.3)
CO2: 24 mmol/L (ref 22–32)
Chloride: 104 mmol/L (ref 101–111)
Creatinine, Ser: 0.73 mg/dL (ref 0.44–1.00)
GFR calc non Af Amer: >60 mL/min (ref >60)
GLUCOSE: 210 mg/dL — AB (ref 65–99)
Potassium: 4.2 mmol/L (ref 3.5–5.1)
Sodium: 138 mmol/L (ref 135–145)
Total Protein: 7.6 g/dL (ref 6.5–8.1)

## 2017-05-20 LAB — HEMOGLOBIN A1C
HEMOGLOBIN A1C: 9.7 % — AB (ref 4.8–5.6)
MEAN PLASMA GLUCOSE: 231.69 mg/dL

## 2017-05-20 LAB — GLUCOSE, CAPILLARY: GLUCOSE-CAPILLARY: 229 mg/dL — AB (ref 65–99)

## 2017-05-20 NOTE — Progress Notes (Signed)
URINALYSIS ROUTED VIA Epic TO DR EMMA ROSSI  

## 2017-05-21 ENCOUNTER — Other Ambulatory Visit: Payer: Self-pay

## 2017-05-21 ENCOUNTER — Encounter: Payer: 59 | Admitting: Internal Medicine

## 2017-05-21 ENCOUNTER — Ambulatory Visit (AMBULATORY_SURGERY_CENTER): Payer: 59 | Admitting: Internal Medicine

## 2017-05-21 ENCOUNTER — Encounter: Payer: Self-pay | Admitting: Internal Medicine

## 2017-05-21 VITALS — BP 107/72 | HR 70 | Temp 98.6°F | Resp 29 | Ht 64.0 in | Wt 183.0 lb

## 2017-05-21 DIAGNOSIS — D128 Benign neoplasm of rectum: Secondary | ICD-10-CM

## 2017-05-21 DIAGNOSIS — D129 Benign neoplasm of anus and anal canal: Secondary | ICD-10-CM

## 2017-05-21 DIAGNOSIS — K2950 Unspecified chronic gastritis without bleeding: Secondary | ICD-10-CM | POA: Diagnosis not present

## 2017-05-21 DIAGNOSIS — Z86010 Personal history of colonic polyps: Secondary | ICD-10-CM

## 2017-05-21 DIAGNOSIS — Z1509 Genetic susceptibility to other malignant neoplasm: Secondary | ICD-10-CM | POA: Diagnosis present

## 2017-05-21 DIAGNOSIS — D123 Benign neoplasm of transverse colon: Secondary | ICD-10-CM | POA: Diagnosis not present

## 2017-05-21 MED ORDER — SODIUM CHLORIDE 0.9 % IV SOLN: 500.0000 mL | Freq: Once | INTRAVENOUS | Status: DC

## 2017-05-21 NOTE — Progress Notes (Deleted)
[email or web address] routed via epic to Dr Everitt Amber

## 2017-05-21 NOTE — Op Note (Signed)
[place] Patient Name: Sydney Dickson Procedure Date: 05/21/2017 1:31 PM MRN: [id number] Endoscopist: Jerene Bears , MD Age: 62 Referring MD:  Date of Birth: 03/16/1956 Gender: Female Account #: [id number] Procedure:                Colonoscopy Indications:              High risk colon cancer surveillance: Personal                            history of adenoma (10 mm or greater in size), High                            risk colon cancer surveillance: Personal history of                            Lynch Syndrome, Last colonoscopy 3 years ago Medicines:                Monitored Anesthesia Care Procedure:                Pre-Anesthesia Assessment:                           - Prior to the procedure, a History and Physical                            was performed, and patient medications and                            allergies were reviewed. The patient's tolerance of                            previous anesthesia was also reviewed. The risks                            and benefits of the procedure and the sedation                            options and risks were discussed with the patient.                            All questions were answered, and informed consent                            was obtained. Prior Anticoagulants: The patient has                            taken no previous anticoagulant or antiplatelet                            agents. ASA Grade Assessment: III - A patient with                            severe systemic disease. After reviewing the risks  and benefits, the patient was deemed in                            satisfactory condition to undergo the procedure.                           After obtaining informed consent, the colonoscope                            was passed under direct vision. Throughout the                            procedure, the patient's blood pressure, pulse, and                            oxygen  saturations were monitored continuously. The                            Colonoscope was introduced through the anus and                            advanced to the the cecum, identified by                            appendiceal orifice and ileocecal valve. The                            colonoscopy was performed without difficulty. The                            patient tolerated the procedure well. The quality                            of the bowel preparation was good. The ileocecal                            valve, appendiceal orifice, and rectum were                            photographed. Scope In: 1:44:08 PM Scope Out: 2:05:04 PM Scope Withdrawal Time: 0 hours 14 minutes 55 seconds  Total Procedure Duration: 0 hours 20 minutes 56 seconds  Findings:                 The digital rectal exam findings include palpable                            scar in the distal rectum.                           Two sessile polyps were found in the transverse                            colon. The polyps were 3 to 4 mm in size. These  polyps were removed with a cold snare. Resection                            and retrieval were complete.                           A 2 mm polyp was found in the splenic flexure. The                            polyp was sessile. The polyp was removed with a                            cold biopsy forceps. Resection and retrieval were                            complete.                           A 6 mm polyp was found in the rectum. The polyp was                            sessile. The polyp was removed with a hot snare.                            Resection and retrieval were complete.                           A small post polypectomy scar was found in the                            distal rectum. There was no evidence of the                            previous polyp.                           Retroflexion in the rectum was not performed due to                             anatomy. Good views obtained from the distal                            rectum/anal canal. Complications:            No immediate complications. Estimated Blood Loss:     Estimated blood loss was minimal. Impression:               - Two 3 to 4 mm polyps in the transverse colon,                            removed with a cold snare. Resected and retrieved.                           - One 2 mm polyp at the splenic  flexure, removed                            with a cold biopsy forceps. Resected and retrieved.                           - One 6 mm polyp in the rectum, removed with a hot                            snare. Resected and retrieved.                           - Post-polypectomy scar in the distal rectum. Recommendation:           - Patient has a contact number available for                            emergencies. The signs and symptoms of potential                            delayed complications were discussed with the                            patient. Return to normal activities tomorrow.                            Written discharge instructions were provided to the                            patient.                           - Resume previous diet.                           - Continue present medications.                           - Await pathology results.                           - Repeat colonoscopy in 1 year for surveillance. Jerene Bears, MD 05/21/2017 2:16:46 PM This report has been signed electronically.

## 2017-05-21 NOTE — Progress Notes (Signed)
Called to room to assist during endoscopic procedure.  Patient ID and intended procedure confirmed with present staff. Received instructions for my participation in the procedure from the performing physician.  

## 2017-05-21 NOTE — Op Note (Signed)
[place] Patient Name: Sydney Dickson Procedure Date: 05/21/2017 1:31 PM MRN: [id number] Endoscopist: Jerene Bears , MD Age: 62 Referring MD:  Date of Birth: 02/14/1956 Gender: Female Account #: [id number] Procedure:                Upper GI endoscopy Indications:              Lynch Syndrome Medicines:                Monitored Anesthesia Care Procedure:                Pre-Anesthesia Assessment:                           - Prior to the procedure, a History and Physical                            was performed, and patient medications and                            allergies were reviewed. The patient's tolerance of                            previous anesthesia was also reviewed. The risks                            and benefits of the procedure and the sedation                            options and risks were discussed with the patient.                            All questions were answered, and informed consent                            was obtained. Prior Anticoagulants: The patient has                            taken no previous anticoagulant or antiplatelet                            agents. ASA Grade Assessment: III - A patient with                            severe systemic disease. After reviewing the risks                            and benefits, the patient was deemed in                            satisfactory condition to undergo the procedure.                           After obtaining informed consent, the endoscope was  passed under direct vision. Throughout the                            procedure, the patient's blood pressure, pulse, and                            oxygen saturations were monitored continuously. The                            Endoscope was introduced through the mouth, and                            advanced to the third part of duodenum. The upper                            GI endoscopy was accomplished without  difficulty.                            The patient tolerated the procedure well. Scope In: Scope Out: Findings:                 The examined esophagus was normal.                           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 This was characterized by healthy                            appearing mucosa.                           Normal mucosa was found in the entire examined                            stomach. Biopsies were taken with a cold forceps                            for histology and Helicobacter pylori testing.                           The examined duodenum was normal. Complications:            No immediate complications. Estimated Blood Loss:     Estimated blood loss was minimal. Impression:               - Normal esophagus.                           - A Nissen fundoplication was found, characterized                            by healthy appearing mucosa.                           -  Normal mucosa was found in the entire stomach.                            Biopsied.                           - Normal examined duodenum. Recommendation:           - Patient has a contact number available for                            emergencies. The signs and symptoms of potential                            delayed complications were discussed with the                            patient. Return to normal activities tomorrow.                            Written discharge instructions were provided to the                            patient.                           - Resume previous diet.                           - Continue present medications.                           - Await pathology results.                           - Repeat upper endoscopy in 3 years for screening                            purposes. Jerene Bears, MD 05/21/2017 2:11:14 PM This report has been signed electronically.

## 2017-05-21 NOTE — Patient Instructions (Signed)
HANDOUT GIVEN FOR POLYPS  YOU HAD AN ENDOSCOPIC PROCEDURE TODAY AT [facility]:   Refer to the procedure report that was given to you for any specific questions about what was found during the examination.  If the procedure report does not answer your questions, please call your gastroenterologist to clarify.  If you requested that your care partner not be given the details of your procedure findings, then the procedure report has been included in a sealed envelope for you to review at your convenience later.  YOU SHOULD EXPECT: Some feelings of bloating in the abdomen. Passage of more gas than usual.  Walking can help get rid of the air that was put into your GI tract during the procedure and reduce the bloating. If you had a lower endoscopy (such as a colonoscopy or flexible sigmoidoscopy) you may notice spotting of blood in your stool or on the toilet paper. If you underwent a bowel prep for your procedure, you may not have a normal bowel movement for a few days.  Please Note:  You might notice some irritation and congestion in your nose or some drainage.  This is from the oxygen used during your procedure.  There is no need for concern and it should clear up in a day or so.  SYMPTOMS TO REPORT IMMEDIATELY:   Following lower endoscopy (colonoscopy or flexible sigmoidoscopy):  Excessive amounts of blood in the stool  Significant tenderness or worsening of abdominal pains  Swelling of the abdomen that is new, acute  Fever of 100F or higher   Following upper endoscopy (EGD)  Vomiting of blood or coffee ground material  New chest pain or pain under the shoulder blades  Painful or persistently difficult swallowing  New shortness of breath  Fever of 100F or higher  Black, tarry-looking stools  For urgent or emergent issues, a gastroenterologist can be reached at any hour by calling [phone].   DIET:  We do recommend a small meal at first, but then you may proceed to  your regular diet.  Drink plenty of fluids but you should avoid alcoholic beverages for 24 hours.  ACTIVITY:  You should plan to take it easy for the rest of today and you should NOT DRIVE or use heavy machinery until tomorrow (because of the sedation medicines used during the test).    FOLLOW UP: Our staff will call the number listed on your records the next business day following your procedure to check on you and address any questions or concerns that you may have regarding the information given to you following your procedure. If we do not reach you, we will leave a message.  However, if you are feeling well and you are not experiencing any problems, there is no need to return our call.  We will assume that you have returned to your regular daily activities without incident.  If any biopsies were taken you will be contacted by phone or by letter within the next 1-3 weeks.  Please call us at [phone] if you have not heard about the biopsies in 3 weeks.    SIGNATURES/CONFIDENTIALITY: You and/or your care partner have signed paperwork which will be entered into your electronic medical record.  These signatures attest to the fact that that the information above on your After Visit Summary has been reviewed and is understood.  Full responsibility of the confidentiality of this discharge information lies with you and/or your care-partner.

## 2017-05-21 NOTE — Progress Notes (Signed)
hgba1c routed via epic to Dr Everitt Amber

## 2017-05-24 ENCOUNTER — Telehealth: Payer: Self-pay | Admitting: *Deleted

## 2017-05-24 ENCOUNTER — Telehealth: Payer: Self-pay | Admitting: Gynecologic Oncology

## 2017-05-24 NOTE — Telephone Encounter (Signed)
Attempted to call patient and discuss pre-op Hgb A1C with the potential that her surgery may be postponed.  Left message asking her to please call the office. Situation to be discussed with Dr. Denman George tomorrow and patient will be notified of recommendations.

## 2017-05-24 NOTE — Telephone Encounter (Signed)
No answer, message left for the patient. 

## 2017-05-24 NOTE — Telephone Encounter (Signed)
  Follow up Call-  Call back number 05/21/2017  Post procedure Call Back phone  # [phone]  Permission to leave phone message Yes  Some recent data might be hidden     Patient questions:  Do you have a fever, pain , or abdominal swelling? No. Pain Score  0 *  Have you tolerated food without any problems? Yes.    Have you been able to return to your normal activities? Yes.    Do you have any questions about your discharge instructions: Diet   No. Medications  No. Follow up visit  No.  Do you have questions or concerns about your Care? No.  Actions: * If pain score is 4 or above: No action needed, pain <4.pt says she went to ER last night due to sore neck(10 on pain scale at that time).Pt says she had had a crick in her neck the day before her Colonoscopy .ER diagnosed her with neck muscle spasm she says and gave her Valium and a pain med. And a muscle relaxer medication Rx that her daughter has gone now to pick up. Pt says she doesn't know if at all related to her procedure. Pain level in neck is now a 4 pt states.

## 2017-05-25 ENCOUNTER — Telehealth: Payer: Self-pay | Admitting: Gynecologic Oncology

## 2017-05-25 NOTE — Telephone Encounter (Signed)
Returned call to patient.  She states ever since she had her colonoscopy, she has had significant muscle cramps in her neck.  She went to the ED and was given valium.  Some improvement noted but spasms still present.  She states she does not feel like she "can handle surgery right now."  Wanting to cancel her surgery and reschedule.  Advised her that we would contact her with a new surgical date.  Also discussed pre-op labs including Hgb A1C.  All questions answered.  Our office will reach out to her to reschedule.  Advised to call for any needs or concerns.

## 2017-05-27 ENCOUNTER — Ambulatory Visit (HOSPITAL_COMMUNITY): Admission: RE | Admit: 2017-05-27 | Payer: 59 | Source: Ambulatory Visit | Admitting: Gynecologic Oncology

## 2017-05-27 ENCOUNTER — Encounter (HOSPITAL_COMMUNITY): Admission: RE | Payer: Self-pay | Source: Ambulatory Visit

## 2017-05-27 LAB — TYPE AND SCREEN
ABO/RH(D): O POS
Antibody Screen: NEGATIVE

## 2017-05-27 SURGERY — HYSTERECTOMY, TOTAL, ROBOT-ASSISTED, LAPAROSCOPIC, WITH BILATERAL SALPINGO-OOPHORECTOMY
Anesthesia: General

## 2017-05-28 ENCOUNTER — Telehealth: Payer: Self-pay | Admitting: Gynecologic Oncology

## 2017-05-28 NOTE — Telephone Encounter (Signed)
Left  Message for patient about rescheduling her surgery.  Advised that Dr. Denman George stated there would need to be improvement in her glucose levels and A1C prior to proceeding with surgery.  Advised her to please call the office to discuss.

## 2017-05-31 ENCOUNTER — Encounter: Payer: Self-pay | Admitting: Surgery

## 2017-06-02 ENCOUNTER — Encounter: Payer: Self-pay | Admitting: Internal Medicine

## 2017-06-10 ENCOUNTER — Telehealth: Payer: Self-pay

## 2017-06-10 NOTE — Telephone Encounter (Signed)
LM for paitnet that 06-16-17 appointment was cancelled as it was a post op appointment and the surgery was cancelled due to uncontrolled diabetes. Pt needs to call the office to set up a follow up appointment with Dr. Denman George in ~1 month to reassess diabetic control.

## 2017-06-16 ENCOUNTER — Ambulatory Visit: Payer: 59 | Admitting: Gynecologic Oncology

## 2017-06-21 ENCOUNTER — Other Ambulatory Visit: Payer: Self-pay | Admitting: Physician Assistant

## 2017-06-21 DIAGNOSIS — E119 Type 2 diabetes mellitus without complications: Secondary | ICD-10-CM

## 2017-06-22 ENCOUNTER — Other Ambulatory Visit: Payer: Self-pay | Admitting: Physician Assistant

## 2017-06-23 ENCOUNTER — Other Ambulatory Visit: Payer: Self-pay | Admitting: Adult Health

## 2017-06-23 ENCOUNTER — Telehealth: Payer: Self-pay | Admitting: *Deleted

## 2017-06-23 DIAGNOSIS — R21 Rash and other nonspecific skin eruption: Secondary | ICD-10-CM

## 2017-06-23 MED ORDER — PENCICLOVIR 1 % EX CREA: 1.0000 "application " | TOPICAL_CREAM | CUTANEOUS | 2 refills | Status: DC

## 2017-06-23 NOTE — Progress Notes (Signed)
Patient calls to report skin eruption responsive to old residual prescription of denavir 1% cream but ran out and requesting refill. This was provided today, encouraged to schedule OV for evaluation should the rash not improve/resolve.

## 2017-06-23 NOTE — Telephone Encounter (Signed)
Returned the patient's call and left a message to call the office back tomorrow if needed. Advised the patient we will notify both Dr. Denman George and Lenna Sciara that she wants to reschedule her surgery

## 2017-06-24 ENCOUNTER — Telehealth: Payer: Self-pay | Admitting: *Deleted

## 2017-06-24 NOTE — Telephone Encounter (Signed)
Called and left the patient a message to call the office regarding appts

## 2017-07-05 ENCOUNTER — Other Ambulatory Visit: Payer: Self-pay | Admitting: Adult Health

## 2017-07-05 MED ORDER — ACYCLOVIR 5 % EX OINT: TOPICAL_OINTMENT | CUTANEOUS | 0 refills | Status: DC

## 2017-07-05 NOTE — Progress Notes (Signed)
Patient presents accompanying her husband and requesting refill of previous medication - acyclovir 5% for recurrent cold sores. She reports this was much more effective than current prescription.

## 2017-07-06 ENCOUNTER — Other Ambulatory Visit: Payer: Self-pay | Admitting: Internal Medicine

## 2017-07-07 ENCOUNTER — Telehealth: Payer: Self-pay

## 2017-07-07 NOTE — Telephone Encounter (Signed)
LMTCB. Need to have patient call her insurance company to find out what they will cover instead of the Acyclovir or if patient is willing to try oral meds instead.

## 2017-07-07 NOTE — Telephone Encounter (Signed)
Pharmacy states that patient requests new Rx: can't get a 90 day Rx for Acyclovir ointment for insurance purposes.

## 2017-07-09 NOTE — Telephone Encounter (Signed)
LMTCB

## 2017-07-13 ENCOUNTER — Other Ambulatory Visit: Payer: Self-pay

## 2017-07-13 DIAGNOSIS — E119 Type 2 diabetes mellitus without complications: Secondary | ICD-10-CM

## 2017-07-14 ENCOUNTER — Inpatient Hospital Stay: Payer: 59 | Attending: Gynecologic Oncology

## 2017-07-14 DIAGNOSIS — E119 Type 2 diabetes mellitus without complications: Secondary | ICD-10-CM

## 2017-07-14 DIAGNOSIS — Z800 Family history of malignant neoplasm of digestive organs: Secondary | ICD-10-CM | POA: Diagnosis not present

## 2017-07-14 DIAGNOSIS — Z803 Family history of malignant neoplasm of breast: Secondary | ICD-10-CM | POA: Diagnosis not present

## 2017-07-14 DIAGNOSIS — Z1509 Genetic susceptibility to other malignant neoplasm: Secondary | ICD-10-CM | POA: Insufficient documentation

## 2017-07-14 LAB — HEMOGLOBIN A1C
HEMOGLOBIN A1C: 9.6 % — AB (ref 4.8–5.6)
Mean Plasma Glucose: 228.82 mg/dL

## 2017-07-16 ENCOUNTER — Inpatient Hospital Stay (HOSPITAL_BASED_OUTPATIENT_CLINIC_OR_DEPARTMENT_OTHER): Payer: 59 | Admitting: Obstetrics

## 2017-07-16 ENCOUNTER — Encounter: Payer: Self-pay | Admitting: Obstetrics

## 2017-07-16 ENCOUNTER — Inpatient Hospital Stay: Payer: 59

## 2017-07-16 VITALS — HR 110 | Temp 97.9°F | Resp 20 | Ht 64.0 in | Wt 178.4 lb

## 2017-07-16 DIAGNOSIS — Z1509 Genetic susceptibility to other malignant neoplasm: Secondary | ICD-10-CM | POA: Diagnosis not present

## 2017-07-16 DIAGNOSIS — Z800 Family history of malignant neoplasm of digestive organs: Secondary | ICD-10-CM | POA: Diagnosis not present

## 2017-07-16 DIAGNOSIS — E119 Type 2 diabetes mellitus without complications: Secondary | ICD-10-CM

## 2017-07-16 DIAGNOSIS — Z803 Family history of malignant neoplasm of breast: Secondary | ICD-10-CM

## 2017-07-16 NOTE — Patient Instructions (Signed)
1. We'll refer you to Endocrinology to help manage the diabetes which has not improved since your visit with [place] in January. 2. Return in 6 weeks for review of below. 3. Ultrasound screening exam 4. CA125

## 2017-07-16 NOTE — Progress Notes (Signed)
Progress Note: Gyn-Oncology Followup Established Patient  Sydney Dickson 62 y.o. female  CC:  Chief Complaint  Patient presents with  . Lynch syndrome  . Diabetes Mellitus    HPI: Referred by Vicie Mutters.  Primary care physician Dr. Unk Pinto. Consulting MDs: Richmond Campbell, Amy Gilford Silvius Pyrtle.  Patient is a very pleasant 62 year old gravida 2 para 2    Interval History-  Since her last visit underwent colonoscopy and endoscopy.  She had polyps that were removed at the time of colonoscopy.  Reportedly the upper endoscopy was normal.  These were performed March 2019. She has no complaints today an present to determine next steps in scheduling her surgery. Her HgbA1C is not improved from prior, on 07/14/17 being 9.6 noting 7 months ago it was 8.3. She states her sugars were better on "Trulicity" but she started to have some irritation at the injection site and she was advised (per her report) to stop the medication. She is now on Amaryl. She has not been on insulin.  She denies bleeding. She has not had ultrasound imaging or CA125 that she is aware.   Presenting History -  She was referred to [place] secondary to an [facility] 2 mutation.  Please see her family history below.  She has 2 children.  She has a 11 year old son who has not been tested as well as a 24 year old daughter who has been tested and is positive.  Her daughter does have a son who is 29-1/2 years old.  Her daughter is getting all of her GYN issues set up. The patient was otherwise in her usual state of health.  Her mammograms were up-to-date.  She had not had urine cytology as far she is aware of.  Her last Pap smear was in December 2017.  Recently they have all been normal.  In the distant past she did have an abnormal Pap but upon repeat normalized.  She has had 2 spontaneous vaginal deliveries.  Her only abdominal surgeries include a laparoscopic Nissen fundoplication in 8250 and a tubal  ligation.  Review of Systems: Constitutional: Denies fever. Skin: No rash Cardiovascular: No chest pain, or edema. Some SOB with activity Pulmonary: No cough  Gastro Intestinal: No nausea, vomiting. Occasional constipation / diarrhea. Chronic "bloating" but nothing new. Genitourinary:  Denies vaginal bleeding and discharge.  Musculoskeletal: Back pain. Joint stiffness Psychology: No complaints, some stress related to family and helping care for her elderly father who has dementia.  Current Meds:  Outpatient Encounter Medications as of 07/16/2017  Medication Sig  . acetaminophen (TYLENOL) 500 MG tablet Take 1,000 mg by mouth daily as needed for moderate pain or headache.  Marland Kitchen acyclovir ointment (ZOVIRAX) 5 % Apply thin layer to affected area  . ALPRAZolam (XANAX) 0.5 MG tablet Take 1 tablet (0.5 mg total) by mouth 2 (two) times daily as needed.  Marland Kitchen amitriptyline (ELAVIL) 50 MG tablet TAKE 1/2-1 TABLET AT NIGHT FOR SLEEP AS NEEDED (Patient taking differently: Take 50 mg by mouth at bedtime. )  . aspirin 81 MG tablet Take 81 mg by mouth daily.  Marland Kitchen escitalopram (LEXAPRO) 20 MG tablet TAKE 1 TABLET BY MOUTH EVERY DAY  . fluticasone (FLONASE) 50 MCG/ACT nasal spray PLACE 2 SPRAYS INTO BOTH NOSTRILS DAILY.  Marland Kitchen glimepiride (AMARYL) 4 MG tablet TAKE 1 TABLET (4 MG TOTAL) BY MOUTH 2 (TWO) TIMES DAILY.  Marland Kitchen glucose blood (FREESTYLE LITE) test strip USE AS INSTRUCTED  . glucose monitoring kit (FREESTYLE) monitoring kit 1 each  by Does not apply route as needed for other. Dispense 1 freestyle lyte monitoring kit  . ibuprofen (ADVIL,MOTRIN) 800 MG tablet Take 1 tablet (800 mg total) by mouth every 8 (eight) hours as needed.  Marland Kitchen levocetirizine (XYZAL) 5 MG tablet TAKE 1 TABLET (5 MG TOTAL) BY MOUTH DAILY.  Marland Kitchen levocetirizine (XYZAL) 5 MG tablet TAKE 1 TABLET BY MOUTH EVERY DAY  . lisdexamfetamine (VYVANSE) 40 MG capsule Take 1 capsule (40 mg total) by mouth daily.  Marland Kitchen losartan (COZAAR) 100 MG tablet TAKE 1/2-1  TABLET DAILY FOR BLOOD PRESSURE AND KIDNEY PROTECTION FROM DM (Patient taking differently: Take 50 mgs by mouth once daily)  . olopatadine (PATANOL) 0.1 % ophthalmic solution PLACE 1 DROP INTO BOTH EYES 2 (TWO) TIMES DAILY.  Marland Kitchen penciclovir (DENAVIR) 1 % cream Apply 1 application topically every 2 (two) hours.  . rosuvastatin (CRESTOR) 20 MG tablet Take 1 tablet (20 mg total) by mouth every other day. (Patient taking differently: Take 10 mg by mouth every other day. )   Facility-Administered Encounter Medications as of 07/16/2017  Medication  . 0.9 %  sodium chloride infusion    Allergy:  Allergies  Allergen Reactions  . Tape Rash  . Ace Inhibitors Cough  . Metformin And Related     myalgia  . Morphine And Related Itching  . Penicillins     Pt states that in 3rd grade she was given an injection in the buttock and reacted with a large hive in that area Pt can take oral penicillin with no reaction    . Wellbutrin [Bupropion]     Felt funny    Social Hx:   Social History   Socioeconomic History  . Marital status: Married    Spouse name: Not on file  . Number of children: Not on file  . Years of education: Not on file  . Highest education level: Not on file  Occupational History  . Not on file  Social Needs  . Financial resource strain: Not on file  . Food insecurity:    Worry: Not on file    Inability: Not on file  . Transportation needs:    Medical: Not on file    Non-medical: Not on file  Tobacco Use  . Smoking status: Never Smoker  . Smokeless tobacco: Never Used  Substance and Sexual Activity  . Alcohol use: No  . Drug use: No  . Sexual activity: Not on file  Lifestyle  . Physical activity:    Days per week: Not on file    Minutes per session: Not on file  . Stress: Not on file  Relationships  . Social connections:    Talks on phone: Not on file    Gets together: Not on file    Attends religious service: Not on file    Active member of club or organization:  Not on file    Attends meetings of clubs or organizations: Not on file    Relationship status: Not on file  . Intimate partner violence:    Fear of current or ex partner: Not on file    Emotionally abused: Not on file    Physically abused: Not on file    Forced sexual activity: Not on file  Other Topics Concern  . Not on file  Social History Narrative  . Not on file    Past Surgical Hx:  Past Surgical History:  Procedure Laterality Date  . [place], 2001  . CHOLECYSTECTOMY    .  colonscopy     . COSMETIC SURGERY     rhinoplasty  . EYE SURGERY  2006   lasik  . HERNIA REPAIR    . NASAL SINUS SURGERY    . NISSEN FUNDOPLICATION  6270  . PARTIAL PROCTECTOMY BY TEM N/A 07/19/2014   Procedure: PARTIAL PROCTECTOMY BY TEM;  Surgeon: Michael Boston, MD;  Location: [facility];  Service: General;  Laterality: N/A;  . ROTATOR CUFF REPAIR Right 2012  . TUBAL LIGATION    . UPPER GI ENDOSCOPY      Past Medical Hx:  Past Medical History:  Diagnosis Date  . ADD (attention deficit disorder)   . Anxiety   . Asthma    with severe allergies pt does use albuterol inhaler when needed  . Depression   . Diabetes mellitus without complication ([place]) 3500   TYPE 2   . GERD (gastroesophageal reflux disease)    hx of had Nissen Fundoplication 9381 has not had any issues since  . History of hiatal hernia   . Hyperlipidemia 2004  . Hypertension   . IBS (irritable bowel syndrome)   . Lynch syndrome   . Tubular adenoma of colon     Oncology Hx:   No history exists.    Family Hx:  Family History  Problem Relation Age of Onset  . Cancer Mother        breast  . Hyperlipidemia Mother   . Hypertension Mother   . Diabetes Mother   . Breast cancer Mother   . Cancer Father        breast, colon, skin, prostate  . Hyperlipidemia Father   . Hypertension Father   . Colon cancer Father 76  . Skin cancer Father   . Cancer Sister        breast  . Breast cancer Sister   . Cancer  Brother 43       colon  . Colon cancer Brother 41  . Cancer Paternal Grandmother        cervical and ? uterine  . Cancer Paternal Grandfather        colon  . Colon cancer Paternal Grandfather 68  . Stomach cancer Neg Hx   . Esophageal cancer Neg Hx     Vitals:  Pulse (!) 110, temperature 97.9 F (36.6 C), resp. rate 20, height 5' 4"  (1.626 m), weight 178 lb 6.4 oz (80.9 kg), SpO2 97 %.  Physical Exam: ECOG PERFORMANCE STATUS: 0 - Asymptomatic   General :  Well developed, 62 y.o., female in no apparent distress HEENT:  Normocephalic/atraumatic, symmetric, EOMI, eyelids normal Neck:   Supple, no mass/lesion. Respiratory:  Respirations unlabored, no use of accessory muscles CV:   Deferred Breast:  Deferred Musculoskeletal: Normal muscle strength. Abdomen:  No visible masses or protrusion. Soft, NT. Extremities:  No visible edema or deformities. No edema Skin:   Normal inspection Neuro/Psych:  No focal motor deficit, no abnormal mental status. Normal gait. Normal affect. 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   Dr. Alycia Rossetti has discussed that she has a increased lifetime risk of developing both an ovarian cancer as well as an endometrial cancer.  Her risk of developing endometrial cancer is 25-60% and her risk of developing ovarian cancer is 4-12%.  At her age we would recommend definitive surgery with a hysterectomy and removal of the fallopian tubes and ovaries.  She very much wishes to proceed.  I believe that we can  perform her surgery in a minimally invasive fashion.  Risks of surgery including but not limited to bleeding, infection, injury to surrounding organs and thromboembolic disease were discussed previously with the patient.    Dr. Alycia Rossetti also discussed We did discuss the need to continue with her colon and stomach screening, annual urine cytology to rule out upper GU tract malignancies  Her surgery was postponed  due to her being ill and then by [place] due to her uncontrolled diabetes. She still has not shown improvement in her HgbA1c.  I recommend the following: 1. Endocrinology consultation to help manage her diabetes knowing this is postponing her risk-reducing surgery. 2. TVUS/CA125 for ovarian cancer screening given we are delaying surgery. She denies bleeding. I do not see a strong benefit to EMB. 3. Return in ~6 weeks to review progress and results of above. 4. Obtain Pap from 2017 that she mentioned to Dr. Alycia Rossetti. 5. I ordered the urine cytology for urinary tract screening.  Isabel Caprice, MD 07/16/2017, 1:27 PM

## 2017-07-17 LAB — CA 125: Cancer Antigen (CA) 125: 16.3 U/mL (ref 0.0–38.1)

## 2017-07-19 ENCOUNTER — Telehealth: Payer: Self-pay | Admitting: *Deleted

## 2017-07-19 NOTE — Telephone Encounter (Addendum)
Called and left the patient a message to call the office back. Left message asking to leave information regarding her pelvic exam in February. Also need to schedule the patient for a urine cytology test.

## 2017-07-20 ENCOUNTER — Inpatient Hospital Stay: Payer: 59

## 2017-07-20 ENCOUNTER — Ambulatory Visit (HOSPITAL_COMMUNITY)
Admission: RE | Admit: 2017-07-20 | Discharge: 2017-07-20 | Disposition: A | Discharge status: Home / Self Care | Payer: 59 | Source: Ambulatory Visit | Attending: Gynecologic Oncology | Admitting: Gynecologic Oncology

## 2017-07-20 DIAGNOSIS — Z1509 Genetic susceptibility to other malignant neoplasm: Secondary | ICD-10-CM | POA: Insufficient documentation

## 2017-07-20 NOTE — Progress Notes (Signed)
Let her know her Ca125 and ultrasound look normal.

## 2017-07-21 NOTE — Progress Notes (Signed)
Let her know urine cytology normal

## 2017-07-22 ENCOUNTER — Telehealth: Payer: Self-pay

## 2017-07-22 NOTE — Telephone Encounter (Signed)
-----   Message from Dorothyann Gibbs, NP sent at 07/22/2017  9:29 AM EDT ----- Please let patient know.  Thank you! ----- Message ----- From: Isabel Caprice, MD Sent: 07/21/2017   5:49 PM To: Dorothyann Gibbs, NP  Let her know urine cytology normal

## 2017-07-22 NOTE — Telephone Encounter (Signed)
Told Ms Denny the results of the urine cytology as noted below by Dr. Gerarda Fraction.

## 2017-07-23 LAB — HM DIABETES EYE EXAM

## 2017-07-26 ENCOUNTER — Encounter: Payer: Self-pay | Admitting: *Deleted

## 2017-07-28 ENCOUNTER — Other Ambulatory Visit: Payer: Self-pay | Admitting: Internal Medicine

## 2017-07-29 ENCOUNTER — Telehealth: Payer: Self-pay

## 2017-07-29 NOTE — Telephone Encounter (Signed)
Notes recorded by Isabel Caprice, MD on 07/20/2017 at 3:09 PM EDT Let her know her Ca125 and ultrasound look normal.

## 2017-07-29 NOTE — Telephone Encounter (Signed)
Told Sydney Dickson the results of the [place] from 07-20-17 and CA-125 from 07-16-17 as noted below by Dr. Gerarda Fraction.

## 2017-08-02 ENCOUNTER — Encounter: Payer: Self-pay | Admitting: Adult Health

## 2017-08-02 ENCOUNTER — Ambulatory Visit: Payer: 59 | Admitting: Adult Health

## 2017-08-02 VITALS — BP 136/84 | HR 102 | Temp 97.4°F | Resp 18 | Ht 64.0 in | Wt 177.0 lb

## 2017-08-02 DIAGNOSIS — J4521 Mild intermittent asthma with (acute) exacerbation: Secondary | ICD-10-CM

## 2017-08-02 MED ORDER — BENZONATATE 100 MG PO CAPS: 200.0000 mg | ORAL_CAPSULE | Freq: Three times a day (TID) | ORAL | 0 refills | Status: DC | PRN

## 2017-08-02 MED ORDER — AZITHROMYCIN 250 MG PO TABS: ORAL_TABLET | ORAL | 1 refills | Status: AC

## 2017-08-02 MED ORDER — ALBUTEROL SULFATE HFA 108 (90 BASE) MCG/ACT IN AERS: 2.0000 | INHALATION_SPRAY | RESPIRATORY_TRACT | 0 refills | Status: DC | PRN

## 2017-08-02 MED ORDER — PREDNISONE 20 MG PO TABS: ORAL_TABLET | ORAL | 0 refills | Status: DC

## 2017-08-02 NOTE — Patient Instructions (Signed)

## 2017-08-02 NOTE — Progress Notes (Signed)
Assessment and Plan:  Sydney Dickson was seen today for cough.  Diagnoses and all orders for this visit:  Mild intermittent asthma with exacerbation Seasonal allergies + mild asthma exacerbation Advised rotating allergy agent to allegra for 1-2 months then resuming -     predniSONE (DELTASONE) 20 MG tablet; 2 tablets daily for 3 days, 1 tablet daily for 4 days. -     benzonatate (TESSALON PERLES) 100 MG capsule; Take 2 capsules (200 mg total) by mouth 3 (three) times daily as needed for cough (Max: 629m per day). -     albuterol (VENTOLIN HFA) 108 (90 Base) MCG/ACT inhaler; Inhale 2 puffs into the lungs every 4 (four) hours as needed for wheezing or shortness of breath. Please give generic or the one that insurance covers   Fill and take if develops productive cough or fever:   -     azithromycin (ZITHROMAX) 250 MG tablet; Take 2 tablets (500 mg) on  Day 1,  followed by 1 tablet (250 mg) once daily on Days 2 through 5.  Further disposition pending results of labs. Discussed med's effects and SE's.   Over 15 minutes of exam, counseling, chart review, and critical decision making was performed.   Future Appointments  Date Time Provider DNueces 08/25/2017  1:30 PM ERenato Shin MD [facility] None  08/26/2017  9:30 AM PIsabel Caprice MD [facility] None  04/11/2018 10:00 AM CVicie Mutters PA-C [facility] None    ------------------------------------------------------------------------------------------------------------------   HPI BP 136/84   Pulse (!) 102   Temp (!) 97.4 F (36.3 C)   Resp 18   Ht _0  (1.626 m)   Wt 177 lb (80.3 kg)   LMP  (LMP Unknown)   SpO2 96%   BMI 30.38 kg/m   62 y.o.female with hx T2DM, mild intermittent asthma presents for non-productive cough, mild dyspnea, wheezing x 1 week. Symptoms constant/staying the same. She reports intermittent nasal congestion (resolves with nasal irrigation). She denies fever/chills, body aches, headaches, dizziness,  vision changes. She reports symptoms acutely became worse 2 days ago after mowing her yard, even though she wore a mask. She takes xyzal daily, uses flonase PRN. Has been taking promethazine-DM cough syrup but has not been helpful. She presents with reactive cough with deep breaths. Speaks in complete sentences.    Past Medical History:  Diagnosis Date  . ADD (attention deficit disorder)   . Anxiety   . Asthma    with severe allergies pt does use albuterol inhaler when needed  . Depression   . Diabetes mellitus without complication ([place] [address]  TYPE 2   . GERD (gastroesophageal reflux disease)    hx of had Nissen Fundoplication 22694has not had any issues since  . History of hiatal hernia   . Hyperlipidemia 2004  . Hypertension   . IBS (irritable bowel syndrome)   . Lynch syndrome   . Tubular adenoma of colon      Allergies  Allergen Reactions  . Tape Rash  . Ace Inhibitors Cough  . Metformin And Related     myalgia  . Morphine And Related Itching  . Penicillins     Pt states that in 3rd grade she was given an injection in the buttock and reacted with a large hive in that area Pt can take oral penicillin with no reaction    . Wellbutrin [Bupropion]     Felt funny    Current Outpatient Medications on File Prior to Visit  Medication Sig  .  acetaminophen (TYLENOL) 500 MG tablet Take 1,000 mg by mouth daily as needed for moderate pain or headache.  . ALPRAZolam (XANAX) 0.5 MG tablet Take 1 tablet (0.5 mg total) by mouth 2 (two) times daily as needed.  Marland Kitchen amitriptyline (ELAVIL) 50 MG tablet TAKE 1/2-1 TABLET AT NIGHT FOR SLEEP AS NEEDED (Patient taking differently: Take 50 mg by mouth at bedtime. )  . aspirin 81 MG tablet Take 81 mg by mouth daily.  Marland Kitchen escitalopram (LEXAPRO) 20 MG tablet TAKE 1 TABLET BY MOUTH EVERY DAY  . fluticasone (FLONASE) 50 MCG/ACT nasal spray PLACE 2 SPRAYS INTO BOTH NOSTRILS DAILY.  Marland Kitchen glimepiride (AMARYL) 4 MG tablet TAKE 1 TABLET (4 MG TOTAL) BY MOUTH 2  (TWO) TIMES DAILY.  Marland Kitchen glucose blood (FREESTYLE LITE) test strip USE AS INSTRUCTED  . glucose monitoring kit (FREESTYLE) monitoring kit 1 each by Does not apply route as needed for other. Dispense 1 freestyle lyte monitoring kit  . ibuprofen (ADVIL,MOTRIN) 800 MG tablet Take 1 tablet (800 mg total) by mouth every 8 (eight) hours as needed.  Marland Kitchen levocetirizine (XYZAL) 5 MG tablet TAKE 1 TABLET (5 MG TOTAL) BY MOUTH DAILY.  Marland Kitchen lisdexamfetamine (VYVANSE) 40 MG capsule Take 1 capsule (40 mg total) by mouth daily.  Marland Kitchen losartan (COZAAR) 100 MG tablet TAKE 1/2-1 TABLET DAILY FOR BLOOD PRESSURE AND KIDNEY PROTECTION FROM DM (Patient taking differently: Take 50 mgs by mouth once daily)  . olopatadine (PATANOL) 0.1 % ophthalmic solution PLACE 1 DROP INTO BOTH EYES 2 (TWO) TIMES DAILY.  . rosuvastatin (CRESTOR) 20 MG tablet Take 1 tablet (20 mg total) by mouth every other day. (Patient taking differently: Take 10 mg by mouth every other day. )  . acyclovir ointment (ZOVIRAX) 5 % Apply thin layer to affected area (Patient not taking: Reported on 08/02/2017)  . levocetirizine (XYZAL) 5 MG tablet TAKE 1 TABLET BY MOUTH EVERY DAY  . penciclovir (DENAVIR) 1 % cream Apply 1 application topically every 2 (two) hours.   Current Facility-Administered Medications on File Prior to Visit  Medication  . 0.9 %  sodium chloride infusion    ROS: Review of Systems  Constitutional: Negative for chills, diaphoresis, fever and malaise/fatigue.  HENT: Positive for congestion. Negative for ear discharge, ear pain, hearing loss, sinus pain, sore throat and tinnitus.   Eyes: Negative for blurred vision, pain, discharge and redness.  Respiratory: Positive for cough, shortness of breath and wheezing. Negative for hemoptysis, sputum production and stridor.   Cardiovascular: Negative for chest pain, palpitations, orthopnea and leg swelling.  Gastrointestinal: Negative for abdominal pain, diarrhea, nausea and vomiting.  Genitourinary:  Negative.   Musculoskeletal: Negative for joint pain and myalgias.  Skin: Negative for rash.  Neurological: Negative for dizziness, sensory change, weakness and headaches.  Endo/Heme/Allergies: Positive for environmental allergies.  Psychiatric/Behavioral: Negative.   All other systems reviewed and are negative.   Physical Exam:  BP 136/84   Pulse (!) 102   Temp (!) 97.4 F (36.3 C)   Resp 18   Ht _0  (1.626 m)   Wt 177 lb (80.3 kg)   LMP  (LMP Unknown)   SpO2 96%   BMI 30.38 kg/m   General Appearance: Well nourished, in no apparent distress. Eyes: PERRLA, EOMs, conjunctiva no swelling or erythema Sinuses: No Frontal/maxillary tenderness ENT/Mouth: Ext aud canals clear, TMs without erythema, bulging. No erythema, swelling, or exudate on post pharynx.  Tonsils not swollen or erythematous. Hearing normal.  Neck: Supple, thyroid normal.  Respiratory: Respiratory  effort normal, BS equal with mild end expiratory wheezes in bilateral bases without rales, rhonchi, or stridor. Reactive cough with attempts to deep breathe.  Cardio: RRR with no MRGs. Brisk peripheral pulses without edema.  Abdomen: Soft, + BS.  Non tender. Lymphatics: Non tender without lymphadenopathy.  Musculoskeletal: normal gait.  Skin: Warm, dry without rashes, lesions, ecchymosis.  Psych: Awake and oriented X 3, normal affect, Insight and Judgment appropriate.     Sydney Ribas, NP 4:14 PM [facility]

## 2017-08-06 ENCOUNTER — Other Ambulatory Visit: Payer: Self-pay | Admitting: Adult Health

## 2017-08-06 ENCOUNTER — Telehealth: Payer: Self-pay | Admitting: Internal Medicine

## 2017-08-06 DIAGNOSIS — J4521 Mild intermittent asthma with (acute) exacerbation: Secondary | ICD-10-CM

## 2017-08-06 DIAGNOSIS — F988 Other specified behavioral and emotional disorders with onset usually occurring in childhood and adolescence: Secondary | ICD-10-CM

## 2017-08-06 MED ORDER — ALBUTEROL SULFATE HFA 108 (90 BASE) MCG/ACT IN AERS: 2.0000 | INHALATION_SPRAY | RESPIRATORY_TRACT | 0 refills | Status: DC | PRN

## 2017-08-06 MED ORDER — ALPRAZOLAM 0.5 MG PO TABS: 0.5000 mg | ORAL_TABLET | Freq: Two times a day (BID) | ORAL | 0 refills | Status: DC | PRN

## 2017-08-06 MED ORDER — LISDEXAMFETAMINE DIMESYLATE 40 MG PO CAPS: 40.0000 mg | ORAL_CAPSULE | Freq: Every day | ORAL | 0 refills | Status: DC

## 2017-08-06 NOTE — Telephone Encounter (Signed)
patient requesting refill on Xanax,  90day renewal on Vyvanse, and  renewal on Albuterol- grandson got into and destroyed Albuterol.  [facility]

## 2017-08-08 ENCOUNTER — Other Ambulatory Visit: Payer: Self-pay | Admitting: Physician Assistant

## 2017-08-08 ENCOUNTER — Other Ambulatory Visit: Payer: Self-pay | Admitting: Internal Medicine

## 2017-08-08 DIAGNOSIS — Z79899 Other long term (current) drug therapy: Secondary | ICD-10-CM

## 2017-08-08 DIAGNOSIS — F330 Major depressive disorder, recurrent, mild: Secondary | ICD-10-CM

## 2017-08-12 ENCOUNTER — Other Ambulatory Visit: Payer: Self-pay

## 2017-08-12 ENCOUNTER — Other Ambulatory Visit: Payer: Self-pay | Admitting: Adult Health

## 2017-08-12 DIAGNOSIS — J4521 Mild intermittent asthma with (acute) exacerbation: Secondary | ICD-10-CM

## 2017-08-12 DIAGNOSIS — F988 Other specified behavioral and emotional disorders with onset usually occurring in childhood and adolescence: Secondary | ICD-10-CM

## 2017-08-12 MED ORDER — ALBUTEROL SULFATE HFA 108 (90 BASE) MCG/ACT IN AERS: 2.0000 | INHALATION_SPRAY | RESPIRATORY_TRACT | 0 refills | Status: DC | PRN

## 2017-08-12 MED ORDER — LISDEXAMFETAMINE DIMESYLATE 40 MG PO CAPS: 40.0000 mg | ORAL_CAPSULE | Freq: Every day | ORAL | 0 refills | Status: DC

## 2017-08-25 ENCOUNTER — Encounter: Payer: Self-pay | Admitting: Endocrinology

## 2017-08-25 ENCOUNTER — Ambulatory Visit: Payer: 59 | Admitting: Endocrinology

## 2017-08-25 VITALS — BP 128/68 | HR 109 | Wt 177.0 lb

## 2017-08-25 DIAGNOSIS — E119 Type 2 diabetes mellitus without complications: Secondary | ICD-10-CM

## 2017-08-25 DIAGNOSIS — Z794 Long term (current) use of insulin: Secondary | ICD-10-CM | POA: Diagnosis not present

## 2017-08-25 MED ORDER — METFORMIN HCL ER 500 MG PO TB24: 500.0000 mg | ORAL_TABLET | Freq: Every day | ORAL | 11 refills | Status: DC

## 2017-08-25 NOTE — Progress Notes (Signed)
Subjective:    Patient ID: Sydney Dickson, female    DOB: [birth year], 62 y.o.   MRN: [id number]  HPI pt is referred by Dr Melford Aase, for diabetes.  Pt states DM was dx'ed in 2015; she has mild if any neuropathy of the lower extremities; she is unaware of any associated chronic complications; she has never been on insulin; pt says her diet and exercise are fair; she has never had GDM, pancreatitis, pancreatic surgery, severe hypoglycemia or DKA.  She takes amaryl.  improved glycemic control is needed for upcoming TAH.  trulicity was stopped, due to redness at the injection site.  She did not tolerate metformin (diarrhea). Past Medical History:  Diagnosis Date  . ADD (attention deficit disorder)   . Anxiety   . Asthma    with severe allergies pt does use albuterol inhaler when needed  . Depression   . Diabetes mellitus without complication ([place]) [address]   TYPE 2   . GERD (gastroesophageal reflux disease)    hx of had Nissen Fundoplication 6811 has not had any issues since  . History of hiatal hernia   . Hyperlipidemia 2004  . Hypertension   . IBS (irritable bowel syndrome)   . Lynch syndrome   . Tubular adenoma of colon     Past Surgical History:  Procedure Laterality Date  . [place], 2001  . CHOLECYSTECTOMY    . colonscopy     . COSMETIC SURGERY     rhinoplasty  . EYE SURGERY  2006   lasik  . HERNIA REPAIR    . NASAL SINUS SURGERY    . NISSEN FUNDOPLICATION  5726  . PARTIAL PROCTECTOMY BY TEM N/A 07/19/2014   Procedure: PARTIAL PROCTECTOMY BY TEM;  Surgeon: Michael Boston, MD;  Location: [facility];  Service: General;  Laterality: N/A;  . ROTATOR CUFF REPAIR Right 2012  . TUBAL LIGATION    . UPPER GI ENDOSCOPY      Social History   Socioeconomic History  . Marital status: Married    Spouse name: Not on file  . Number of children: Not on file  . Years of education: Not on file  . Highest education level: Not on file  Occupational History  . Not on file    Social Needs  . Financial resource strain: Not on file  . Food insecurity:    Worry: Not on file    Inability: Not on file  . Transportation needs:    Medical: Not on file    Non-medical: Not on file  Tobacco Use  . Smoking status: Never Smoker  . Smokeless tobacco: Never Used  Substance and Sexual Activity  . Alcohol use: No  . Drug use: No  . Sexual activity: Not on file  Lifestyle  . Physical activity:    Days per week: Not on file    Minutes per session: Not on file  . Stress: Not on file  Relationships  . Social connections:    Talks on phone: Not on file    Gets together: Not on file    Attends religious service: Not on file    Active member of club or organization: Not on file    Attends meetings of clubs or organizations: Not on file    Relationship status: Not on file  . Intimate partner violence:    Fear of current or ex partner: Not on file    Emotionally abused: Not on file    Physically abused:  Not on file    Forced sexual activity: Not on file  Other Topics Concern  . Not on file  Social History Narrative  . Not on file    Current Outpatient Medications on File Prior to Visit  Medication Sig Dispense Refill  . acetaminophen (TYLENOL) 500 MG tablet Take 1,000 mg by mouth daily as needed for moderate pain or headache.    . albuterol (VENTOLIN HFA) 108 (90 Base) MCG/ACT inhaler Inhale 2 puffs into the lungs every 4 (four) hours as needed for wheezing or shortness of breath. 1 Inhaler 0  . ALPRAZolam (XANAX) 0.5 MG tablet Take 1 tablet (0.5 mg total) by mouth 2 (two) times daily as needed. 60 tablet 0  . amitriptyline (ELAVIL) 50 MG tablet TAKE 1/2-1 TABLET AT NIGHT FOR SLEEP AS NEEDED 90 tablet 0  . aspirin 81 MG tablet Take 81 mg by mouth daily.    Marland Kitchen escitalopram (LEXAPRO) 20 MG tablet TAKE 1 TABLET BY MOUTH EVERY DAY 90 tablet 0  . fluticasone (FLONASE) 50 MCG/ACT nasal spray PLACE 2 SPRAYS INTO BOTH NOSTRILS DAILY. 48 g 1  . ibuprofen (ADVIL,MOTRIN) 800  MG tablet Take 1 tablet (800 mg total) by mouth every 8 (eight) hours as needed. 90 tablet 1  . levocetirizine (XYZAL) 5 MG tablet TAKE 1 TABLET (5 MG TOTAL) BY MOUTH DAILY. 90 tablet 3  . lisdexamfetamine (VYVANSE) 40 MG capsule Take 1 capsule (40 mg total) by mouth daily. 90 capsule 0  . losartan (COZAAR) 100 MG tablet TAKE 1/2-1 TABLET DAILY FOR BLOOD PRESSURE AND KIDNEY PROTECTION FROM DM (Patient taking differently: Take 50 mgs by mouth once daily) 90 tablet 1  . olopatadine (PATANOL) 0.1 % ophthalmic solution PLACE 1 DROP INTO BOTH EYES 2 (TWO) TIMES DAILY. 5 mL 5  . rosuvastatin (CRESTOR) 20 MG tablet Take 1 tablet (20 mg total) by mouth every other day. (Patient taking differently: Take 10 mg by mouth every other day. ) 90 tablet 1  . acyclovir ointment (ZOVIRAX) 5 % Apply thin layer to affected area (Patient not taking: Reported on 08/25/2017) 15 g 0  . benzonatate (TESSALON PERLES) 100 MG capsule Take 2 capsules (200 mg total) by mouth 3 (three) times daily as needed for cough (Max: 638m per day). (Patient not taking: Reported on 08/25/2017) 60 capsule 0  . glimepiride (AMARYL) 4 MG tablet TAKE 1 TABLET (4 MG TOTAL) BY MOUTH 2 (TWO) TIMES DAILY. 180 tablet 3  . glucose blood (FREESTYLE LITE) test strip USE AS INSTRUCTED (Patient not taking: Reported on 08/25/2017) 200 each 1  . glucose monitoring kit (FREESTYLE) monitoring kit 1 each by Does not apply route as needed for other. Dispense 1 freestyle lyte monitoring kit (Patient not taking: Reported on 08/25/2017) 1 each 0  . levocetirizine (XYZAL) 5 MG tablet TAKE 1 TABLET BY MOUTH EVERY DAY (Patient not taking: Reported on 08/25/2017) 90 tablet 1  . penciclovir (DENAVIR) 1 % cream Apply 1 application topically every 2 (two) hours. (Patient not taking: Reported on 08/25/2017) 1.5 g 2  . predniSONE (DELTASONE) 20 MG tablet 2 tablets daily for 3 days, 1 tablet daily for 4 days. (Patient not taking: Reported on 08/25/2017) 10 tablet 0   Current  Facility-Administered Medications on File Prior to Visit  Medication Dose Route Frequency Provider Last Rate Last Dose  . 0.9 %  sodium chloride infusion  500 mL Intravenous Once Pyrtle, JLajuan Lines MD        Allergies  Allergen Reactions  .  Tape Rash  . Ace Inhibitors Cough  . Metformin And Related     myalgia  . Morphine And Related Itching  . Penicillins     Pt states that in 3rd grade she was given an injection in the buttock and reacted with a large hive in that area Pt can take oral penicillin with no reaction    . Wellbutrin [Bupropion]     Felt funny    Family History  Problem Relation Age of Onset  . Cancer Mother        breast  . Hyperlipidemia Mother   . Hypertension Mother   . Diabetes Mother   . Breast cancer Mother   . Cancer Father        breast, colon, skin, prostate  . Hyperlipidemia Father   . Hypertension Father   . Colon cancer Father 69  . Skin cancer Father   . Cancer Sister        breast  . Breast cancer Sister   . Cancer Brother 91       colon  . Colon cancer Brother 65  . Cancer Paternal Grandmother        cervical and ? uterine  . Cancer Paternal Grandfather        colon  . Colon cancer Paternal Grandfather 51  . Stomach cancer Neg Hx   . Esophageal cancer Neg Hx     BP 128/68   Pulse (!) 109   Wt 177 lb (80.3 kg)   LMP  (LMP Unknown)   SpO2 95%   BMI 30.38 kg/m    Review of Systems denies weight loss, blurry vision, headache, chest pain, sob, n/v, urinary frequency, muscle cramps, excessive diaphoresis, memory loss, cold intolerance, rhinorrhea, and easy bruising.  Depression is well-controlled.  She has allergy-related cough.      Objective:   Physical Exam VS: see vs page GEN: no distress HEAD: head: no deformity eyes: no periorbital swelling, no proptosis external nose and ears are normal mouth: no lesion seen NECK: supple, thyroid is not enlarged CHEST WALL: no deformity LUNGS: clear to auscultation CV: reg rate and  rhythm, no murmur ABD: abdomen is soft, nontender.  no hepatosplenomegaly.  not distended.  no hernia MUSCULOSKELETAL: muscle bulk and strength are grossly normal.  no obvious joint swelling.  gait is normal and steady EXTEMITIES: no deformity.  no ulcer on the feet.  feet are of normal color and temp.  no edema.   PULSES: dorsalis pedis intact bilat.  no carotid bruit NEURO:  cn 2-12 grossly intact.   readily moves all 4's.  sensation is intact to touch on the feet SKIN:  Normal texture and temperature.  No rash or suspicious lesion is visible.   NODES:  None palpable at the neck PSYCH: alert, well-oriented.  Does not appear anxious nor depressed.   Lab Results  Component Value Date   HGBA1C 9.6 (H) 07/14/2017   I have reviewed outside records, and summarized: Pt was noted to have elevated a1c, and referred here.  Due to genetic mutation, TAH and BSO were advised.       Assessment & Plan:  Insulin-requiring type 2 DM: she needs increased rx.  Intolerance to trulicity: we'll use ozempic instead.   Patient Instructions  good diet and exercise significantly improve the control of your diabetes.  please let me know if you wish to be referred to a dietician.  high blood sugar is very risky to your health.  you should  see an eye doctor and dentist every year.  It is very important to get all recommended vaccinations.  Controlling your blood pressure and cholesterol drastically reduces the damage diabetes does to your body.  Those who smoke should quit.  Please discuss these with your doctor.  check your blood sugar once a day.  vary the time of day when you check, between before the 3 meals, and at bedtime.  also check if you have symptoms of your blood sugar being too high or too low.  please keep a record of the readings and bring it to your next appointment here (or you can bring the meter itself).  You can write it on any piece of paper.  please call us sooner if your blood sugar goes below  70, or if you have a lot of readings over 200.   I have sent a prescription to your pharmacy, to add extended-release metformin. Please call or message [place] next week, to tell us how the blood sugar is doing.  Our goal is the low to mid-100's.  If it is still high, we can add "ozempic."   Please come back for a follow-up appointment in 6 weeks.

## 2017-08-25 NOTE — Patient Instructions (Addendum)
good diet and exercise significantly improve the control of your diabetes.  please let me know if you wish to be referred to a dietician.  high blood sugar is very risky to your health.  you should see an eye doctor and dentist every year.  It is very important to get all recommended vaccinations.  Controlling your blood pressure and cholesterol drastically reduces the damage diabetes does to your body.  Those who smoke should quit.  Please discuss these with your doctor.  check your blood sugar once a day.  vary the time of day when you check, between before the 3 meals, and at bedtime.  also check if you have symptoms of your blood sugar being too high or too low.  please keep a record of the readings and bring it to your next appointment here (or you can bring the meter itself).  You can write it on any piece of paper.  please call us sooner if your blood sugar goes below 70, or if you have a lot of readings over 200.   I have sent a prescription to your pharmacy, to add extended-release metformin. Please call or message [place] next week, to tell us how the blood sugar is doing.  Our goal is the low to mid-100's.  If it is still high, we can add "ozempic."   Please come back for a follow-up appointment in 6 weeks.

## 2017-08-26 ENCOUNTER — Inpatient Hospital Stay: Payer: 59 | Admitting: Obstetrics

## 2017-09-06 ENCOUNTER — Encounter: Payer: Self-pay | Admitting: Obstetrics

## 2017-09-06 ENCOUNTER — Other Ambulatory Visit (HOSPITAL_COMMUNITY)
Admission: RE | Admit: 2017-09-06 | Discharge: 2017-09-06 | Disposition: A | Discharge status: Home / Self Care | Payer: 59 | Source: Ambulatory Visit | Attending: Obstetrics | Admitting: Obstetrics

## 2017-09-06 ENCOUNTER — Other Ambulatory Visit: Payer: Self-pay | Admitting: Internal Medicine

## 2017-09-06 ENCOUNTER — Inpatient Hospital Stay: Payer: 59 | Attending: Gynecologic Oncology | Admitting: Obstetrics

## 2017-09-06 VITALS — BP 138/89 | HR 110 | Temp 98.0°F | Resp 20 | Ht 64.0 in | Wt 174.4 lb

## 2017-09-06 DIAGNOSIS — Z1502 Genetic susceptibility to malignant neoplasm of ovary: Secondary | ICD-10-CM | POA: Insufficient documentation

## 2017-09-06 DIAGNOSIS — Z1509 Genetic susceptibility to other malignant neoplasm: Secondary | ICD-10-CM | POA: Insufficient documentation

## 2017-09-06 DIAGNOSIS — Z800 Family history of malignant neoplasm of digestive organs: Secondary | ICD-10-CM

## 2017-09-06 DIAGNOSIS — Z803 Family history of malignant neoplasm of breast: Secondary | ICD-10-CM | POA: Diagnosis not present

## 2017-09-06 DIAGNOSIS — Z8042 Family history of malignant neoplasm of prostate: Secondary | ICD-10-CM | POA: Diagnosis not present

## 2017-09-06 NOTE — Progress Notes (Signed)
Progress Note: Gyn-Oncology Followup Established Patient  Sydney Dickson 62 y.o. female  CC:  Chief Complaint  Patient presents with  . Lynch synd rome    HPI: Referred by Vicie Mutters.   Primary care physician Dr. Unk Pinto.   Patient is a very pleasant 62 y.o. gravida 2 para 2    Interval History-  For her Donnal Debar she has undergone colonoscopy and endoscopy 06/2017.  She had polyps that were removed at the time of colonoscopy.  Reportedly the upper endoscopy was normal.   She has no complaints today and present to determine next steps in scheduling of her surgery; no we have been postponing her due to her uncontrolled diabetes. After her last visit noting the length of time of lack of glucose control I referred her on to endocrinology. She was started on some type of alternative to metformin; she states metformin XR.  Endocrinology's plan is to see her back early July to determine next steps.  We do note since her last visit a transvaginal ultrasound and Ca1 25 were performed and within normal limits.  She continued to not deny any postmenopausal bleeding.  Note her endometrial thickness was 5 mm again she denies any post menopausal bleeding I also had urine cytology performed as it seemed that that had not yet been done and that was negative for malignancy.  He was diagnosed with some type of lymphoma and she wants to postpone surgery until October 2019.   Presenting History -  She was referred to [place] secondary to an [facility] 2 mutation.  Please see her family history below.  She has 2 children.  She has a 74 year old son who has not been tested as well as a 22 year old daughter who has been tested and is positive.  Her daughter does have a son who is 22-1/2 years old.  Her daughter is getting all of her GYN issues set up. The patient was otherwise in her usual state of health.  Her mammograms were up-to-date.  She had not had urine cytology as far she is aware of.  Her last Pap smear  was in December 2017.  Recently they have all been normal.  In the distant past she did have an abnormal Pap but upon repeat normalized.  She has had 2 spontaneous vaginal deliveries.  Her only abdominal surgeries include a laparoscopic Nissen fundoplication in 6270 and a tubal ligation.  Current Meds:  Outpatient Encounter Medications as of 09/06/2017  Medication Sig  . acetaminophen (TYLENOL) 500 MG tablet Take 1,000 mg by mouth daily as needed for moderate pain or headache.  . albuterol (VENTOLIN HFA) 108 (90 Base) MCG/ACT inhaler Inhale 2 puffs into the lungs every 4 (four) hours as needed for wheezing or shortness of breath.  . ALPRAZolam (XANAX) 0.5 MG tablet Take 1 tablet (0.5 mg total) by mouth 2 (two) times daily as needed.  Marland Kitchen amitriptyline (ELAVIL) 50 MG tablet TAKE 1/2-1 TABLET AT NIGHT FOR SLEEP AS NEEDED  . aspirin 81 MG tablet Take 81 mg by mouth daily.  . benzonatate (TESSALON PERLES) 100 MG capsule Take 2 capsules (200 mg total) by mouth 3 (three) times daily as needed for cough (Max: 670m per day).  .Marland Kitchenescitalopram (LEXAPRO) 20 MG tablet TAKE 1 TABLET BY MOUTH EVERY DAY  . fluticasone (FLONASE) 50 MCG/ACT nasal spray PLACE 2 SPRAYS INTO BOTH NOSTRILS DAILY.  .Marland Kitchenglimepiride (AMARYL) 4 MG tablet TAKE 1 TABLET (4 MG TOTAL) BY MOUTH 2 (TWO) TIMES DAILY.  .Marland Kitchen  ibuprofen (ADVIL,MOTRIN) 800 MG tablet Take 1 tablet (800 mg total) by mouth every 8 (eight) hours as needed.  Marland Kitchen levocetirizine (XYZAL) 5 MG tablet TAKE 1 TABLET (5 MG TOTAL) BY MOUTH DAILY.  Marland Kitchen lisdexamfetamine (VYVANSE) 40 MG capsule Take 1 capsule (40 mg total) by mouth daily.  Marland Kitchen losartan (COZAAR) 100 MG tablet TAKE 1/2-1 TABLET DAILY FOR BLOOD PRESSURE AND KIDNEY PROTECTION FROM DM (Patient taking differently: Patient takes 61m every T, Th, Sun nights)  . metFORMIN (GLUCOPHAGE-XR) 500 MG 24 hr tablet Take 1 tablet (500 mg total) by mouth daily with breakfast.  . olopatadine (PATANOL) 0.1 % ophthalmic solution PLACE 1 DROP INTO BOTH  EYES 2 (TWO) TIMES DAILY.  . rosuvastatin (CRESTOR) 20 MG tablet Take 1 tablet (20 mg total) by mouth every other day. (Patient taking differently: Take 10 mg by mouth every other day. )  . [DISCONTINUED] acyclovir ointment (ZOVIRAX) 5 % Apply thin layer to affected area (Patient not taking: Reported on 08/25/2017)  . [DISCONTINUED] glucose blood (FREESTYLE LITE) test strip USE AS INSTRUCTED (Patient not taking: Reported on 08/25/2017)  . [DISCONTINUED] glucose monitoring kit (FREESTYLE) monitoring kit 1 each by Does not apply route as needed for other. Dispense 1 freestyle lyte monitoring kit  . [DISCONTINUED] levocetirizine (XYZAL) 5 MG tablet TAKE 1 TABLET BY MOUTH EVERY DAY (Patient not taking: Reported on 08/25/2017)  . [DISCONTINUED] penciclovir (DENAVIR) 1 % cream Apply 1 application topically every 2 (two) hours. (Patient not taking: Reported on 09/06/2017)  . [DISCONTINUED] predniSONE (DELTASONE) 20 MG tablet 2 tablets daily for 3 days, 1 tablet daily for 4 days. (Patient not taking: Reported on 08/25/2017)   Facility-Administered Encounter Medications as of 09/06/2017  Medication  . 0.9 %  sodium chloride infusion    Allergy:  Allergies  Allergen Reactions  . Tape Rash  . Ace Inhibitors Cough  . Metformin And Related     myalgia  . Morphine And Related Itching  . Penicillins     Pt states that in 3rd grade she was given an injection in the buttock and reacted with a large hive in that area Pt can take oral penicillin with no reaction    . Wellbutrin [Bupropion]     Felt funny    Social Hx:   Social History   Socioeconomic History  . Marital status: Married    Spouse name: Not on file  . Number of children: Not on file  . Years of education: Not on file  . Highest education level: Not on file  Occupational History  . Not on file  Social Needs  . Financial resource strain: Not on file  . Food insecurity:    Worry: Not on file    Inability: Not on file  . Transportation  needs:    Medical: Not on file    Non-medical: Not on file  Tobacco Use  . Smoking status: Never Smoker  . Smokeless tobacco: Never Used  Substance and Sexual Activity  . Alcohol use: No  . Drug use: No  . Sexual activity: Not on file  Lifestyle  . Physical activity:    Days per week: Not on file    Minutes per session: Not on file  . Stress: Not on file  Relationships  . Social connections:    Talks on phone: Not on file    Gets together: Not on file    Attends religious service: Not on file    Active member of club or organization: Not  on file    Attends meetings of clubs or organizations: Not on file    Relationship status: Not on file  . Intimate partner violence:    Fear of current or ex partner: Not on file    Emotionally abused: Not on file    Physically abused: Not on file    Forced sexual activity: Not on file  Other Topics Concern  . Not on file  Social History Narrative  . Not on file    Past Surgical Hx:  Past Surgical History:  Procedure Laterality Date  . [place], 2001  . CHOLECYSTECTOMY    . colonscopy     . COSMETIC SURGERY     rhinoplasty  . EYE SURGERY  2006   lasik  . HERNIA REPAIR    . NASAL SINUS SURGERY    . NISSEN FUNDOPLICATION  4818  . PARTIAL PROCTECTOMY BY TEM N/A 07/19/2014   Procedure: PARTIAL PROCTECTOMY BY TEM;  Surgeon: Michael Boston, MD;  Location: [facility];  Service: General;  Laterality: N/A;  . ROTATOR CUFF REPAIR Right 2012  . TUBAL LIGATION    . UPPER GI ENDOSCOPY      Past Medical Hx:  Past Medical History:  Diagnosis Date  . ADD (attention deficit disorder)   . Anxiety   . Asthma    with severe allergies pt does use albuterol inhaler when needed  . Depression   . Diabetes mellitus without complication ([place]) 5631   TYPE 2   . GERD (gastroesophageal reflux disease)    hx of had Nissen Fundoplication 4970 has not had any issues since  . History of hiatal hernia   . Hyperlipidemia 2004  .  Hypertension   . IBS (irritable bowel syndrome)   . Lynch syndrome   . Tubular adenoma of colon     Oncology Hx:   No history exists.    Family Hx:  Family History  Problem Relation Age of Onset  . Cancer Mother        breast  . Hyperlipidemia Mother   . Hypertension Mother   . Diabetes Mother   . Breast cancer Mother   . Cancer Father        breast, colon, skin, prostate  . Hyperlipidemia Father   . Hypertension Father   . Colon cancer Father 21  . Skin cancer Father   . Cancer Sister        breast  . Breast cancer Sister   . Cancer Brother 61       colon  . Colon cancer Brother 24  . Cancer Paternal Grandmother        cervical and ? uterine  . Cancer Paternal Grandfather        colon  . Colon cancer Paternal Grandfather 57  . Stomach cancer Neg Hx   . Esophageal cancer Neg Hx     Review of Systems  HENT:   Positive for tinnitus.   Eyes: Positive for eye problems.  Respiratory: Positive for cough.   Cardiovascular: Positive for palpitations.  Gastrointestinal: Positive for constipation and diarrhea.  Psychiatric/Behavioral: Positive for depression.  All other systems reviewed and are negative. + dry mouth + joint stiffness NOTE palpitations <1/ month. States stressed because of meeting for disability with her husbands diagnosis   Vitals:  Blood pressure 138/89, pulse (!) 110, temperature 98 F (36.7 C), temperature source Oral, resp. rate 20, height _0  (1.626 m), weight 174 lb 6.4 oz (79.1 kg),  SpO2 97 %. Body mass index is 29.94 kg/m.  Physical Exam:  ECOG PERFORMANCE STATUS: 0 - Asymptomatic   General :  Well developed, 62 y.o., female in no apparent distress HEENT:  Normocephalic/atraumatic, symmetric, EOMI, eyelids normal Neck:   Supple, no masses.  Lymphatics:  No cervical/ submandibular/ supraclavicular/ infraclavicular/ inguinal adenopathy Respiratory:  Respirations unlabored, no use of accessory muscles CV:   Deferred Breast:   Deferred Musculoskeletal: No CVA tenderness, normal muscle strength. Abdomen:   Soft, non-tender and nondistended. No evidence of hernia. No masses. Extremities:  No lymphedema, no erythema, non-tender. Skin:   Normal inspection Neuro/Psych:  No focal motor deficit, no abnormal mental status. Normal gait. Normal affect. Alert and oriented to person, place, and time  Genito Urinary: Vulva: Normal external female genitalia.  Bladder/urethra: Urethral meatus normal in size and location. No lesions or   masses, well supported bladder Speculum exam: Vagina: Notable for pelvic prolapse.  No lesion, no discharge, no bleeding. Cervix: Normal appearing, no lesions. Bimanual exam:  Uterus: Normal size, mobile.  Adnexa: Question palpable left ovary and cul-de-sac no masses of concern. Rectovaginal:  See above adnexal exam good tone, no masses, no cul de sac nodularity, no parametrial involvement or nodularity.   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   Dr. Alycia Rossetti has discussed that she has a increased lifetime risk of developing both an ovarian cancer as well as an endometrial cancer.  Her risk of developing endometrial cancer is 25-60% and her risk of developing ovarian cancer is 4-12%.  At her age we would recommend definitive surgery with a hysterectomy and removal of the fallopian tubes and ovaries.  She very much wishes to proceed.  I believe that we can perform her surgery in a minimally invasive fashion.  Risks of surgery including but not limited to bleeding, infection, injury to surrounding organs and thromboembolic disease were discussed previously with the patient.    Her surgery was postponed due to her being ill and then by [place] due to her uncontrolled diabetes. She still has not shown improvement in her HgbA1c we have written now to endocrinology and will await their final recommendations.  She has next follow-up with them July 2019  I recommend the following: 1. Endocrinology  -continue medication per their most recent recommendations and follow-up in July 2. TVUS/CA125 -we reviewed her findings.  Note that the endometrial lining is 5 mm but she denies post menopausal bleeding.  She does not appear to have cervical stenosis on exam. 3. We briefly reviewed the possibility of endometrial biopsy while she postpone surgery until after her husband's treatments we both agree that if she starts bleeding then she will let us know and we would probably biopsy her at that time 4. Return to clinic mid to end of July after endocrinology has seen her back to get an idea of how her diabetes treatment is coming along 5. Pap smear apparently was last done 2015 so we went ahead and did a Pap smear today we will add her know those results 6. I did review the normal urine cytology with her today. 7. She wants to postpone surgery until October due to her husband's recent diagnosis and plan treatment.  My plan would be to potentially schedule surgery in October with a Ca1 25 and ultrasound 1 week prior and an office visit 1 week prior to the planned surgical date.  She is in agreement with this plan and had no further questions today  Isabel Caprice,  MD 09/06/2017, 2:41 PM   Cc: PCP

## 2017-09-06 NOTE — Patient Instructions (Signed)
1. Pap was done and we'll let you know those results 2. Return mid-end July to followup Endocrinology plans and potential for moving on to surgical scheduling

## 2017-09-08 ENCOUNTER — Telehealth: Payer: Self-pay

## 2017-09-08 LAB — CYTOLOGY - PAP
DIAGNOSIS: NEGATIVE
HPV (WINDOPATH): NOT DETECTED

## 2017-09-08 NOTE — Telephone Encounter (Signed)
Pt notified about pap results: negative.  No questions or concerns voiced. 

## 2017-09-14 ENCOUNTER — Other Ambulatory Visit: Payer: Self-pay | Admitting: Adult Health

## 2017-09-14 DIAGNOSIS — J4521 Mild intermittent asthma with (acute) exacerbation: Secondary | ICD-10-CM

## 2017-09-18 ENCOUNTER — Other Ambulatory Visit: Payer: Self-pay | Admitting: Physician Assistant

## 2017-10-06 ENCOUNTER — Ambulatory Visit: Payer: 59 | Admitting: Endocrinology

## 2017-10-06 DIAGNOSIS — Z0289 Encounter for other administrative examinations: Secondary | ICD-10-CM

## 2017-10-18 ENCOUNTER — Telehealth: Payer: Self-pay

## 2017-10-18 ENCOUNTER — Other Ambulatory Visit: Payer: Self-pay | Admitting: Physician Assistant

## 2017-10-18 ENCOUNTER — Ambulatory Visit: Payer: 59 | Admitting: Obstetrics

## 2017-10-18 DIAGNOSIS — F330 Major depressive disorder, recurrent, mild: Secondary | ICD-10-CM

## 2017-10-18 NOTE — Progress Notes (Deleted)
GYNECOLOGIC ONCOLOGY - [facility] at [facility] MD,PhD, Marti Sleigh MD, Precious Haws Md, Everitt Amber MD [place] at [facility]  Progress Note : Established Patient  [name] 62 y.o. female  CC:  No chief complaint on file.  Initially referred by Sydney Dickson.   Primary care physician Dr. Unk Dickson.   HPI: [name] is a very pleasant 62 y.o. P2    Interval History-  For her Lynch syndrome screening she has undergone colonoscopy and endoscopy 06/2017.  She had polyps that were removed at the time of colonoscopy.  Reportedly the upper endoscopy was normal.    Her surgery has been postponed due to uncontrolled diabetes.  She has been seen by Endocrinology and started on some type of alternative to metformin; she states metformin XR.  Endocrinology's plan was to see her back early July to determine next steps.  ***  The patient had wanted to postpone surgery until October 2019.   Presenting History -  She was referred to [place] secondary to an [facility] 2 mutation.  Please see her family history below.  She has 2 children.  She has a 73 year old son who has not been tested as well as a 49 year old daughter who has been tested and is positive.  Her daughter does have a son who is 30-1/2 years old.  Her daughter is getting all of her GYN issues set up. The patient was otherwise in her usual state of health.  Her mammograms were up-to-date.  She had not had urine cytology as far she is aware of.  Her last Pap smear was in December 2017.  Recently they have all been normal.  In the distant past she did have an abnormal Pap but upon repeat normalized.  She has had 2 spontaneous vaginal deliveries.  Her only abdominal surgeries include a laparoscopic Nissen fundoplication in 3329 and a tubal ligation.  Radiology   07/20/17 - TVUS Uterus Measurements: 6.6 x 2.8 x 4.3 cm.  Normal morphology without mass Endometrium Thickness: 5 mm thick,  normal. No endometrial fluid or focal Abnormality Right ovary Not visualized on either transabdominal or endovaginal imaging suspect obscured by bowel Left ovary Measurements: 1.7 x 0.9 x 1.0 cm.  Normal morphology without mass Other findings No free pelvic fluid or adnexal masses. IMPRESSION: Unremarkable uterus and LEFT ovary. Nonvisualization of RIGHT ovary.   CA125 Recent Labs    07/16/17 1312  CAN125 16.3      Current Meds:  Outpatient Encounter Medications as of 10/18/2017  Medication Sig  . acetaminophen (TYLENOL) 500 MG tablet Take 1,000 mg by mouth daily as needed for moderate pain or headache.  . albuterol (PROVENTIL HFA;VENTOLIN HFA) 108 (90 Base) MCG/ACT inhaler  Use 1 to 2 Inhalations 15 minutes apart every 4 hours to Rescue Asthma  . ALPRAZolam (XANAX) 0.5 MG tablet Take 1 tablet (0.5 mg total) by mouth 2 (two) times daily as needed.  Marland Kitchen amitriptyline (ELAVIL) 50 MG tablet TAKE 1/2-1 TABLET AT NIGHT FOR SLEEP AS NEEDED  . aspirin 81 MG tablet Take 81 mg by mouth daily.  Marland Kitchen escitalopram (LEXAPRO) 20 MG tablet TAKE 1 TABLET BY MOUTH EVERY DAY  . fluticasone (FLONASE) 50 MCG/ACT nasal spray PLACE 2 SPRAYS INTO BOTH NOSTRILS DAILY.  Marland Kitchen glimepiride (AMARYL) 4 MG tablet TAKE 1 TABLET (4 MG TOTAL) BY MOUTH 2 (TWO) TIMES DAILY.  Marland Kitchen ibuprofen (ADVIL,MOTRIN) 800 MG tablet Take 1 tablet (800 mg  total) by mouth every 8 (eight) hours as needed.  Marland Kitchen levocetirizine (XYZAL) 5 MG tablet TAKE 1 TABLET (5 MG TOTAL) BY MOUTH DAILY.  Marland Kitchen lisdexamfetamine (VYVANSE) 40 MG capsule Take 1 capsule (40 mg total) by mouth daily.  Marland Kitchen losartan (COZAAR) 100 MG tablet TAKE 1/2-1 TABLET DAILY FOR BLOOD PRESSURE AND KIDNEY PROTECTION FROM DM  . metFORMIN (GLUCOPHAGE-XR) 500 MG 24 hr tablet Take 1 tablet (500 mg total) by mouth daily with breakfast.  . olopatadine (PATANOL) 0.1 % ophthalmic solution PLACE 1 DROP INTO BOTH EYES 2 (TWO) TIMES DAILY.  . rosuvastatin (CRESTOR) 20 MG tablet Take 1 tablet (20 mg total) by  mouth every other day. (Patient taking differently: Take 10 mg by mouth every other day. )   Facility-Administered Encounter Medications as of 10/18/2017  Medication  . 0.9 %  sodium chloride infusion    Allergy:  Allergies  Allergen Reactions  . Tape Rash  . Ace Inhibitors Cough  . Metformin And Related     myalgia  . Morphine And Related Itching  . Penicillins     Pt states that in 3rd grade she was given an injection in the buttock and reacted with a large hive in that area Pt can take oral penicillin with no reaction    . Wellbutrin [Bupropion]     Felt funny    Social Hx:   Tobacco: never EtOH: none Illicit Drug use: none   Past Surgical Hx:  Past Surgical History:  Procedure Laterality Date  . [place], 2001  . CHOLECYSTECTOMY    . colonscopy     . COSMETIC SURGERY     rhinoplasty  . EYE SURGERY  2006   lasik  . HERNIA REPAIR    . NASAL SINUS SURGERY    . NISSEN FUNDOPLICATION  0092  . PARTIAL PROCTECTOMY BY TEM N/A 07/19/2014   Procedure: PARTIAL PROCTECTOMY BY TEM;  Surgeon: Sydney Boston, MD;  Location: [facility];  Service: General;  Laterality: N/A;  . ROTATOR CUFF REPAIR Right 2012  . TUBAL LIGATION    . UPPER GI ENDOSCOPY      Past Medical Hx:  Past Medical History:  Diagnosis Date  . ADD (attention deficit disorder)   . Anxiety   . Asthma    with severe allergies pt does use albuterol inhaler when needed  . Depression   . Diabetes mellitus without complication ([place]) 3300   TYPE 2   . GERD (gastroesophageal reflux disease)    hx of had Nissen Fundoplication 7622 has not had any issues since  . History of hiatal hernia   . Hyperlipidemia 2004  . Hypertension   . IBS (irritable bowel syndrome)   . Lynch syndrome   . Tubular adenoma of colon     Oncology Hx:   No history exists.    Family Hx:  Family History  Problem Relation Age of Onset  . Cancer Mother        breast  . Hyperlipidemia Mother   . Hypertension Mother    . Diabetes Mother   . Breast cancer Mother   . Cancer Father        breast, colon, skin, prostate  . Hyperlipidemia Father   . Hypertension Father   . Colon cancer Father 30  . Skin cancer Father   . Cancer Sister        breast  . Breast cancer Sister   . Cancer Brother 74  colon  . Colon cancer Brother 29  . Cancer Paternal Grandmother        cervical and ? uterine  . Cancer Paternal Grandfather        colon  . Colon cancer Paternal Grandfather 69  . Stomach cancer Neg Hx   . Esophageal cancer Neg Hx     Review of Systems - Oncology+ dry mouth + joint stiffness NOTE palpitations <1/ month. States stressed because of meeting for disability with her husbands diagnosis   Vitals:  There were no vitals taken for this visit. There is no height or weight on file to calculate BMI.  Physical Exam:  ECOG PERFORMANCE STATUS: 0 - Asymptomatic   General :  Well developed, 62 y.o., female in no apparent distress HEENT:  Normocephalic/atraumatic, symmetric, EOMI, eyelids normal Neck:   Supple, no masses.  Lymphatics:  No cervical/ submandibular/ supraclavicular/ infraclavicular/ inguinal adenopathy Respiratory:  Respirations unlabored, no use of accessory muscles CV:   Deferred Breast:  Deferred Musculoskeletal: No CVA tenderness, normal muscle strength. Abdomen:   Soft, non-tender and nondistended. No evidence of hernia. No masses. Extremities:  No lymphedema, no erythema, non-tender. Skin:   Normal inspection Neuro/Psych:  No focal motor deficit, no abnormal mental status. Normal gait. Normal affect. Alert and oriented to person, place, and time  Genito Urinary: Vulva: Normal external female genitalia.  Bladder/urethra: Urethral meatus normal in size and location. No lesions or   masses, well supported bladder Speculum exam: Vagina: Notable for pelvic prolapse.  No lesion, no discharge, no bleeding. Cervix: Normal appearing, no lesions. Bimanual exam:  Uterus:  Normal size, mobile.  Adnexa: Question palpable left ovary and cul-de-sac no masses of concern. Rectovaginal:  See above adnexal exam good tone, no masses, no cul de sac nodularity, no parametrial involvement or nodularity.   Assessment: MSH2 mutation.   Dr. Alycia Rossetti and I have discussed that she has a increased lifetime risk of developing both an ovarian cancer as well as an endometrial cancer.  Her risk of developing endometrial cancer is 25-60% and her risk of developing ovarian cancer is 4-12%.  At her age we would recommend definitive surgery with a hysterectomy and removal of the fallopian tubes and ovaries.  She very much wishes to proceed.    Her surgery was postponed due to her being ill and then by [place] due to her uncontrolled diabetes.   PLAN: 1. Endocrinology -continue medication per their most recent recommendations and follow-up in July 2. We briefly reviewed the possibility of endometrial biopsy while she postpone surgery until after her husband's treatments we both agree that if she starts bleeding then she will let us know and we would probably biopsy her at that time 3. Return to clinic mid to end of July after endocrinology has seen her back to get an idea of how her diabetes treatment is coming along 4. Pap smear by me 09/2017 negative with negative HRHPV. 5. Urine cytology I believe should be done annually. We checked it in June 2019. Will defer to PCP after we have completed her care with [place]. 6. She wants to postpone surgery until October due to her husband's recent diagnosis and plan treatment.  My plan would be to potentially schedule surgery in October with a Ca1 25 and ultrasound 1 week prior and an office visit 1 week prior to the planned surgical date.  Isabel Caprice, MD 10/18/2017, 7:57 AM   Cc: PCP

## 2017-10-18 NOTE — Telephone Encounter (Signed)
Pt stated that she is cancelling appointment with ?Dr. Gerarda Fraction today and will call at a later date to reschedule.

## 2017-11-01 ENCOUNTER — Other Ambulatory Visit: Payer: Self-pay | Admitting: Internal Medicine

## 2017-11-01 ENCOUNTER — Other Ambulatory Visit: Payer: Self-pay | Admitting: Adult Health

## 2017-11-01 ENCOUNTER — Other Ambulatory Visit: Payer: Self-pay | Admitting: Physician Assistant

## 2017-11-01 DIAGNOSIS — E785 Hyperlipidemia, unspecified: Secondary | ICD-10-CM

## 2017-11-01 DIAGNOSIS — Z79899 Other long term (current) drug therapy: Secondary | ICD-10-CM

## 2017-11-24 NOTE — Progress Notes (Signed)
FOLLOW UP  Assessment and Plan:   Hypertension Well controlled with current medications  Monitor blood pressure at home; patient to call if consistently greater than 130/80 Continue DASH diet.   Reminder to go to the ER if any CP, SOB, nausea, dizziness, severe HA, changes vision/speech, left arm numbness and tingling and jaw pain.  Cholesterol Currently near goal; currently taking 10 mg rosuvastatin every other day, advised increase to 20 mg every other day Continue low cholesterol diet and exercise.  Check lipid panel.   Diabetes without complications Managed by endocrinology Continue medication: metformin XR 500 mg once daily; advised try increasing to BID; endocrinology was considering adding onglyza, pending results will plan on starting this as she currently doesn't have follow up scheduled Continue diet and exercise.  Perform daily foot/skin check, notify office of any concerning changes.  Check A1C  Overweight Long discussion about weight loss, diet, and exercise Recommended diet heavy in fruits and veggies and low in animal meats, cheeses, and dairy products, appropriate calorie intake Discussed ideal weight for height Will follow up in 3 months  Vitamin D Def Below goal at last visit; hasn't been supplementing but willing to restart - has 5000 IU daily  Defer Vit D level  Depression/anxiety Continue medications  Lifestyle discussed: diet/exerise, sleep hygiene, stress management, hydration  ADD Changed vyvanse to 30 mg to see if this would improve tachycardia/insomnia Helps with focus, no AE's. The patient was counseled on the addictive nature of the medication and was encouraged to take drug holidays when not needed.    Continue diet and meds as discussed. Further disposition pending results of labs. Discussed med's effects and SE's.   Over 30 minutes of exam, counseling, chart review, and critical decision making was performed.   Future Appointments  Date  Time Provider [place]  04/11/2018 10:00 AM Vicie Mutters, PA-C [facility] None    ----------------------------------------------------------------------------------------------------------------------  HPI 62 y.o. female  presents for 3 month follow up on hypertension, cholesterol, diabetes, weight and vitamin D deficiency.   She has depression/anxiety and ADD and is currently on lexapro, xanax 0.5 mg BID PRN, vyvanse 40 mg daily; reports symptoms are well controlled on current regimen, but having some difficulty falling asleep; discussed trial of 30 mg dose.  she currently uses xanax occasionally, ~2 tabs/week depending on stress.   BMI is Body mass index is 28.15 kg/m., she has been working on diet.  Wt Readings from Last 3 Encounters:  11/29/17 164 lb (74.4 kg)  09/06/17 174 lb 6.4 oz (79.1 kg)  08/25/17 177 lb (80.3 kg)   Her blood pressure has been controlled at home, today their BP is BP: 126/84  She does not workout, but watches her 55 y/o grandchild and consequently very acitve She denies chest pain, shortness of breath, dizziness.   She is on cholesterol medication (rosuvastatin 10 mg every other day) and denies myalgias. Her cholesterol is not at goal. The cholesterol last visit was:   Lab Results  Component Value Date   CHOL 166 03/26/2017   HDL 56 03/26/2017   [place] 92 03/26/2017   TRIG 90 03/26/2017   CHOLHDL 3.0 03/26/2017    She has been working on diet and exercise for T2DM (has been referred to Dr. Rocco Pauls for management, currently taking metformin XR 500 mg once daily, was discussing adding onglyza), and denies foot ulcerations, increased appetite, nausea, paresthesia of the feet, polydipsia, polyuria, visual disturbances, vomiting and weight loss. She does check fasting sugars, reports currently  running 250-300 in the AM (worse than previous). Last A1C in the office was:  Lab Results  Component Value Date   HGBA1C 9.6 (H) 07/14/2017   Patient is  not on Vitamin D supplement.   Lab Results  Component Value Date   VD25OH 39 04/14/2016        Current Medications:  Current Outpatient Medications on File Prior to Visit  Medication Sig  . acetaminophen (TYLENOL) 500 MG tablet Take 1,000 mg by mouth daily as needed for moderate pain or headache.  . albuterol (PROVENTIL HFA;VENTOLIN HFA) 108 (90 Base) MCG/ACT inhaler  Use 1 to 2 Inhalations 15 minutes apart every 4 hours to Rescue Asthma  . ALPRAZolam (XANAX) 0.5 MG tablet TAKE 1 TABLET (0.5 MG TOTAL) BY MOUTH 2 (TWO) TIMES DAILY AS NEEDED.  Marland Kitchen amitriptyline (ELAVIL) 50 MG tablet TAKE 1/2-1 TABLET AT NIGHT FOR SLEEP AS NEEDED  . aspirin 81 MG tablet Take 81 mg by mouth daily.  Marland Kitchen escitalopram (LEXAPRO) 20 MG tablet TAKE 1 TABLET BY MOUTH EVERY DAY  . fluticasone (FLONASE) 50 MCG/ACT nasal spray PLACE 2 SPRAYS INTO BOTH NOSTRILS DAILY.  Marland Kitchen ibuprofen (ADVIL,MOTRIN) 800 MG tablet Take 1 tablet (800 mg total) by mouth every 8 (eight) hours as needed.  Marland Kitchen levocetirizine (XYZAL) 5 MG tablet TAKE 1 TABLET (5 MG TOTAL) BY MOUTH DAILY.  Marland Kitchen lisdexamfetamine (VYVANSE) 40 MG capsule Take 1 capsule (40 mg total) by mouth daily.  Marland Kitchen losartan (COZAAR) 100 MG tablet TAKE 1/2-1 TABLET DAILY FOR BLOOD PRESSURE AND KIDNEY PROTECTION FROM DM  . metFORMIN (GLUCOPHAGE-XR) 500 MG 24 hr tablet Take 1 tablet (500 mg total) by mouth daily with breakfast.  . olopatadine (PATANOL) 0.1 % ophthalmic solution PLACE 1 DROP INTO BOTH EYES 2 (TWO) TIMES DAILY.  . rosuvastatin (CRESTOR) 20 MG tablet TAKE 1 TABLET (20 MG TOTAL) BY MOUTH EVERY OTHER DAY.   Current Facility-Administered Medications on File Prior to Visit  Medication  . 0.9 %  sodium chloride infusion     Allergies:  Allergies  Allergen Reactions  . Tape Rash  . Ace Inhibitors Cough  . Metformin And Related     myalgia  . Morphine And Related Itching  . Penicillins     Pt states that in 3rd grade she was given an injection in the buttock and reacted  with a large hive in that area Pt can take oral penicillin with no reaction    . Wellbutrin [Bupropion]     Felt funny     Medical History:  Past Medical History:  Diagnosis Date  . ADD (attention deficit disorder)   . Anxiety   . Asthma    with severe allergies pt does use albuterol inhaler when needed  . Depression   . Diabetes mellitus without complication ([place]) 0017   TYPE 2   . GERD (gastroesophageal reflux disease)    hx of had Nissen Fundoplication 4944 has not had any issues since  . History of hiatal hernia   . Hyperlipidemia 2004  . Hypertension   . IBS (irritable bowel syndrome)   . Lynch syndrome   . Tubular adenoma of colon    Family history- Reviewed and unchanged Social history- Reviewed and unchanged   Review of Systems:  Review of Systems  Constitutional: Negative for malaise/fatigue and weight loss.  HENT: Negative for hearing loss and tinnitus.   Eyes: Negative for blurred vision and double vision.  Respiratory: Negative for cough, shortness of breath and wheezing.  Cardiovascular: Negative for chest pain, palpitations, orthopnea, claudication and leg swelling.  Gastrointestinal: Negative for abdominal pain, blood in stool, constipation, diarrhea, heartburn, melena, nausea and vomiting.  Genitourinary: Negative.   Musculoskeletal: Negative for joint pain and myalgias.  Skin: Negative for rash.  Neurological: Negative for dizziness, tingling, sensory change, weakness and headaches.  Endo/Heme/Allergies: Negative for polydipsia.  Psychiatric/Behavioral: Positive for depression. The patient is nervous/anxious and has insomnia.   All other systems reviewed and are negative.     Physical Exam: BP 126/84   Pulse (!) 116   Temp (!) 97.5 F (36.4 C)   Ht 5\' 4"  (1.626 m)   Wt 164 lb (74.4 kg)   LMP  (LMP Unknown)   SpO2 95%   BMI 28.15 kg/m  Wt Readings from Last 3 Encounters:  11/29/17 164 lb (74.4 kg)  09/06/17 174 lb 6.4 oz (79.1 kg)   08/25/17 177 lb (80.3 kg)   General Appearance: Well nourished, in no apparent distress. Eyes: PERRLA, EOMs, conjunctiva no swelling or erythema Sinuses: No Frontal/maxillary tenderness ENT/Mouth: Ext aud canals clear, TMs without erythema, bulging. No erythema, swelling, or exudate on post pharynx.  Tonsils not swollen or erythematous. Hearing normal.  Neck: Supple, thyroid normal.  Respiratory: Respiratory effort normal, BS equal bilaterally without rales, rhonchi, wheezing or stridor.  Cardio: sinus tachy with no MRGs. Brisk peripheral pulses without edema.  Abdomen: Soft, + BS.  Non tender, no guarding, rebound, hernias, masses. Lymphatics: Non tender without lymphadenopathy.  Musculoskeletal: Full ROM, 5/5 strength, Normal gait Skin: Warm, dry without rashes, lesions, ecchymosis.  Neuro: Cranial nerves intact. No cerebellar symptoms.  Psych: Awake and oriented X 3, normal affect, Insight and Judgment appropriate.    Izora Ribas, NP 11:00 AM [facility]

## 2017-11-29 ENCOUNTER — Encounter: Payer: Self-pay | Admitting: Adult Health

## 2017-11-29 ENCOUNTER — Ambulatory Visit: Payer: 59 | Admitting: Adult Health

## 2017-11-29 VITALS — BP 126/84 | HR 116 | Temp 97.5°F | Ht 64.0 in | Wt 164.0 lb

## 2017-11-29 DIAGNOSIS — E559 Vitamin D deficiency, unspecified: Secondary | ICD-10-CM | POA: Diagnosis not present

## 2017-11-29 DIAGNOSIS — F418 Other specified anxiety disorders: Secondary | ICD-10-CM | POA: Diagnosis not present

## 2017-11-29 DIAGNOSIS — F988 Other specified behavioral and emotional disorders with onset usually occurring in childhood and adolescence: Secondary | ICD-10-CM | POA: Diagnosis not present

## 2017-11-29 DIAGNOSIS — Z794 Long term (current) use of insulin: Secondary | ICD-10-CM

## 2017-11-29 DIAGNOSIS — Z79899 Other long term (current) drug therapy: Secondary | ICD-10-CM

## 2017-11-29 DIAGNOSIS — E785 Hyperlipidemia, unspecified: Secondary | ICD-10-CM

## 2017-11-29 DIAGNOSIS — I10 Essential (primary) hypertension: Secondary | ICD-10-CM | POA: Diagnosis not present

## 2017-11-29 DIAGNOSIS — E119 Type 2 diabetes mellitus without complications: Secondary | ICD-10-CM | POA: Diagnosis not present

## 2017-11-29 DIAGNOSIS — E1169 Type 2 diabetes mellitus with other specified complication: Secondary | ICD-10-CM

## 2017-11-29 DIAGNOSIS — E663 Overweight: Secondary | ICD-10-CM

## 2017-11-29 MED ORDER — LISDEXAMFETAMINE DIMESYLATE 30 MG PO CAPS: 30.0000 mg | ORAL_CAPSULE | ORAL | 0 refills | Status: DC

## 2017-11-29 NOTE — Patient Instructions (Signed)
Goals    . HEMOGLOBIN A1C < 8       Increase metformin to twice daily; if tolerating OK, consider increasing further to 3 tabs daily spread over meals  Ok to take imodium for mild diarrhea/loose stools   Saxagliptin oral tablets What is this medicine? SAXAGLIPTIN (SAX a glip tin) helps to treat type 2 diabetes. It helps to control blood sugar. Treatment is combined with diet and exercise. This medicine may be used for other purposes; ask your health care provider or pharmacist if you have questions. COMMON BRAND NAME(S): Onglyza What should I tell my health care provider before I take this medicine? They need to know if you have any of these conditions: -diabetic ketoacidosis -heart disease -heart failure -kidney disease -pancreatitis -previous swelling of the tongue, face, or lips with difficulty breathing, difficulty swallowing, hoarseness, or tightening of the throat -type 1 diabetes -an unusual or allergic reaction to saxagliptin, other medicines, foods, dyes, or preservatives -pregnant or trying to get pregnant -breast-feeding How should I use this medicine? Take this medicine by mouth with a glass of water. Follow the directions on the prescription label. You can take it with or without food. Do not cut, crush, or chew this medicine. Take your dose at the same time each day. Do not take more often than directed. Do not stop taking except on your doctor's advice. A special MedGuide will be given to you by the pharmacist with each prescription and refill. Be sure to read this information carefully each time. Talk to your pediatrician regarding the use of this medicine in children. Special care may be needed. Overdosage: If you think you have taken too much of this medicine contact a poison control center or emergency room at once. NOTE: This medicine is only for you. Do not share this medicine with others. What if I miss a dose? If you miss a dose, take it as soon as you can. If  it is almost time for your next dose, take only that dose. Do not take double or extra doses. What may interact with this medicine? Do not take this medicine with any of the following medications: -gatifloxacin This medicine may also interact with the following medications: -alcohol -atazanavir -clarithromycin -indinavir -insulin -itraconazole -ketoconazole -nefazodone -nelfinavir -ritonavir -saquinavir -sulfonylureas like glimepiride, glipizide, glyburide -telithromycin This list may not describe all possible interactions. Give your health care provider a list of all the medicines, herbs, non-prescription drugs, or dietary supplements you use. Also tell them if you smoke, drink alcohol, or use illegal drugs. Some items may interact with your medicine. What should I watch for while using this medicine? Visit your doctor or health care professional for regular checks on your progress. A test called the HbA1C (A1C) will be monitored. This is a simple blood test. It measures your blood sugar control over the last 2 to 3 months. You will receive this test every 3 to 6 months. Learn how to check your blood sugar. Learn the symptoms of low and high blood sugar and how to manage them. Always carry a quick-source of sugar with you in case you have symptoms of low blood sugar. Examples include hard sugar candy or glucose tablets. Make sure others know that you can choke if you eat or drink when you develop serious symptoms of low blood sugar, such as seizures or unconsciousness. They must get medical help at once. Tell your doctor or health care professional if you have high blood sugar. You might need  to change the dose of your medicine. If you are sick or exercising more than usual, you might need to change the dose of your medicine. Do not skip meals. Ask your doctor or health care professional if you should avoid alcohol. Many nonprescription cough and cold products contain sugar or alcohol.  These can affect blood sugar. Wear a medical ID bracelet or chain, and carry a card that describes your disease and details of your medicine and dosage times. What side effects may I notice from receiving this medicine? Side effects that you should report to your doctor or health care professional as soon as possible: -allergic reactions like skin rash, itching or hives, swelling of the face, lips, or tongue -breathing problems -fever, chills -joint pain -pain when urinating -signs and symptoms of low blood sugar such as feeling anxious, confusion, dizziness, increased hunger, unusually weak or tired, sweating, shakiness, cold, irritable, headache, blurred vision, fast heartbeat, loss of consciousness -swelling of the ankles, feet, hands -unusual stomach upset or pain -vomiting Side effects that usually do not require medical attention (report to your doctor or health care professional if they continue or are bothersome): -headache -stuffy or runny nose This list may not describe all possible side effects. Call your doctor for medical advice about side effects. You may report side effects to FDA at 1-800-FDA-1088. Where should I keep my medicine? Keep out of the reach of children. Store at room temperature between 15 and 30 degrees C (59 and 86 degrees F). Throw away any unused medicine after the expiration date. NOTE: This sheet is a summary. It may not cover all possible information. If you have questions about this medicine, talk to your doctor, pharmacist, or health care provider.  2018 Elsevier/Gold Standard (2014-07-11 13:06:26)

## 2017-12-01 LAB — LIPID PANEL
Cholesterol: 183 mg/dL (ref <200)
HDL: 51 mg/dL (ref >50)
LDL Cholesterol (Calc): 97 mg/dL (calc)
Non-HDL Cholesterol (Calc): 132 mg/dL (calc) — ABNORMAL HIGH (ref <130)
Total CHOL/HDL Ratio: 3.6 (calc) (ref <5.0)
Triglycerides: 229 mg/dL — ABNORMAL HIGH (ref <150)

## 2017-12-01 LAB — COMPLETE METABOLIC PANEL WITH GFR
AG Ratio: 1.4 (calc) (ref 1.0–2.5)
ALBUMIN MSPROF: 4.2 g/dL (ref 3.6–5.1)
ALKALINE PHOSPHATASE (APISO): 126 U/L (ref 33–130)
ALT: 23 U/L (ref 6–29)
AST: 18 U/L (ref 10–35)
BUN: 15 mg/dL (ref 7–25)
CO2: 26 mmol/L (ref 20–32)
CREATININE: 0.88 mg/dL (ref 0.50–0.99)
Calcium: 9.8 mg/dL (ref 8.6–10.4)
Chloride: 100 mmol/L (ref 98–110)
GFR, EST AFRICAN AMERICAN: 82 mL/min/{1.73_m2} (ref >=60)
GFR, Est Non African American: 71 mL/min/{1.73_m2} (ref >=60)
GLUCOSE: 410 mg/dL — AB (ref 65–99)
Globulin: 3.1 g/dL (calc) (ref 1.9–3.7)
Potassium: 4.1 mmol/L (ref 3.5–5.3)
Sodium: 136 mmol/L (ref 135–146)
Total Bilirubin: 0.6 mg/dL (ref 0.2–1.2)
Total Protein: 7.3 g/dL (ref 6.1–8.1)

## 2017-12-01 LAB — CBC WITH DIFFERENTIAL/PLATELET
Basophils Absolute: 108 cells/uL (ref 0–200)
Basophils Relative: 1.5 %
Eosinophils Absolute: 230 cells/uL (ref 15–500)
Eosinophils Relative: 3.2 %
HCT: 46.8 % — ABNORMAL HIGH (ref 35.0–45.0)
Hemoglobin: 15.3 g/dL (ref 11.7–15.5)
Lymphs Abs: 2527 cells/uL (ref 850–3900)
MCH: 28 pg (ref 27.0–33.0)
MCHC: 32.7 g/dL (ref 32.0–36.0)
MCV: 85.6 fL (ref 80.0–100.0)
MPV: 11.2 fL (ref 7.5–12.5)
Monocytes Relative: 5.5 %
Neutro Abs: 3938 cells/uL (ref 1500–7800)
Neutrophils Relative %: 54.7 %
Platelets: 313 10*3/uL (ref 140–400)
RBC: 5.47 10*6/uL — ABNORMAL HIGH (ref 3.80–5.10)
RDW: 11.8 % (ref 11.0–15.0)
Total Lymphocyte: 35.1 %
WBC mixed population: 396 cells/uL (ref 200–950)
WBC: 7.2 10*3/uL (ref 3.8–10.8)

## 2017-12-01 LAB — HEMOGLOBIN A1C
Hgb A1c MFr Bld: 13.8 % of total Hgb — ABNORMAL HIGH (ref <5.7)
Mean Plasma Glucose: 349 (calc)
eAG (mmol/L): 19.4 (calc)

## 2017-12-01 LAB — TSH: TSH: 4.46 m[IU]/L (ref 0.40–4.50)

## 2017-12-01 LAB — MAGNESIUM: MAGNESIUM: 1.8 mg/dL (ref 1.5–2.5)

## 2017-12-23 ENCOUNTER — Other Ambulatory Visit: Payer: Self-pay | Admitting: Adult Health

## 2017-12-23 ENCOUNTER — Telehealth: Payer: Self-pay | Admitting: Internal Medicine

## 2017-12-23 DIAGNOSIS — J069 Acute upper respiratory infection, unspecified: Secondary | ICD-10-CM

## 2017-12-23 MED ORDER — PREDNISONE 20 MG PO TABS: ORAL_TABLET | ORAL | 0 refills | Status: DC

## 2017-12-23 NOTE — Telephone Encounter (Signed)
Patient called, got into poison ivy. requesting prednsone to [facility], [address]

## 2017-12-30 ENCOUNTER — Ambulatory Visit (INDEPENDENT_AMBULATORY_CARE_PROVIDER_SITE_OTHER): Payer: 59 | Admitting: *Deleted

## 2017-12-30 DIAGNOSIS — Z23 Encounter for immunization: Secondary | ICD-10-CM

## 2018-01-01 ENCOUNTER — Other Ambulatory Visit: Payer: Self-pay | Admitting: Internal Medicine

## 2018-01-17 ENCOUNTER — Other Ambulatory Visit: Payer: Self-pay | Admitting: Adult Health

## 2018-01-17 ENCOUNTER — Telehealth: Payer: Self-pay

## 2018-01-17 DIAGNOSIS — E119 Type 2 diabetes mellitus without complications: Secondary | ICD-10-CM

## 2018-01-17 DIAGNOSIS — Z79899 Other long term (current) drug therapy: Secondary | ICD-10-CM

## 2018-01-17 MED ORDER — PREDNISONE 20 MG PO TABS: ORAL_TABLET | ORAL | 0 refills | Status: DC

## 2018-01-17 MED ORDER — METFORMIN HCL ER 500 MG PO TB24: ORAL_TABLET | ORAL | 2 refills | Status: DC

## 2018-01-17 MED ORDER — FLUCONAZOLE 150 MG PO TABS: 150.0000 mg | ORAL_TABLET | Freq: Once | ORAL | 3 refills | Status: AC

## 2018-01-17 MED ORDER — PROMETHAZINE-DM 6.25-15 MG/5ML PO SYRP: 5.0000 mL | ORAL_SOLUTION | Freq: Four times a day (QID) | ORAL | 1 refills | Status: DC | PRN

## 2018-01-17 MED ORDER — AZITHROMYCIN 250 MG PO TABS: ORAL_TABLET | ORAL | 1 refills | Status: AC

## 2018-01-17 NOTE — Telephone Encounter (Signed)
Patient is complaining of sinus drainage, sinus pressure, headache and cough. Mucus is green in color. Has had symptoms for 1 week. Requesting an antibiotic and also will need Diflucan for the antibiotic

## 2018-01-17 NOTE — Telephone Encounter (Signed)
Patient states that she is currently on an allergy regimen daily already along with using a Neti Pot. Has taken tylenol and sudafed for her current symptoms. Patient advised that antibiotics were sent in and to follow up with [place] if she hasn't improved at all.

## 2018-01-29 ENCOUNTER — Other Ambulatory Visit: Payer: Self-pay | Admitting: Adult Health

## 2018-01-29 DIAGNOSIS — Z79899 Other long term (current) drug therapy: Secondary | ICD-10-CM

## 2018-03-01 NOTE — Progress Notes (Deleted)
FOLLOW UP  Assessment and Plan:   Hypertension Well controlled with current medications  Monitor blood pressure at home; patient to call if consistently greater than 130/80 Continue DASH diet.   Reminder to go to the ER if any CP, SOB, nausea, dizziness, severe HA, changes vision/speech, left arm numbness and tingling and jaw pain.  Cholesterol Currently near goal; currently taking 10 mg rosuvastatin every other day, advised increase to 20 mg every other day Continue low cholesterol diet and exercise.  Check lipid panel.   Diabetes without complications Managed by endocrinology Continue medication: metformin XR 500 mg once daily; advised try increasing to BID; endocrinology was considering adding onglyza, pending results will plan on starting this as she currently doesn't have follow up scheduled Continue diet and exercise.  Perform daily foot/skin check, notify office of any concerning changes.  Check A1C  Overweight Long discussion about weight loss, diet, and exercise Recommended diet heavy in fruits and veggies and low in animal meats, cheeses, and dairy products, appropriate calorie intake Discussed ideal weight for height Will follow up in 3 months  Vitamin D Def Below goal at last visit; hasn't been supplementing but willing to restart - has 5000 IU daily  Defer Vit D level  Depression/anxiety Continue medications  Lifestyle discussed: diet/exerise, sleep hygiene, stress management, hydration  ADD Changed vyvanse to 30 mg to see if this would improve tachycardia/insomnia Helps with focus, no AE's. The patient was counseled on the addictive nature of the medication and was encouraged to take drug holidays when not needed.    Continue diet and meds as discussed. Further disposition pending results of labs. Discussed med's effects and SE's.   Over 30 minutes of exam, counseling, chart review, and critical decision making was performed.   Future Appointments  Date  Time Provider [place]  03/02/2018 11:30 AM Liane Comber, NP [facility] None  04/11/2018 10:00 AM Vicie Mutters, PA-C [facility] None    ----------------------------------------------------------------------------------------------------------------------  HPI 62 y.o. female  presents for 3 month follow up on hypertension, cholesterol, diabetes, weight and vitamin D deficiency.   She has depression/anxiety and ADD and is currently on lexapro, xanax 0.5 mg BID PRN, vyvanse 40 mg daily; reports symptoms are well controlled on current regimen, but having some difficulty falling asleep; discussed trial of 30 mg dose.  she currently uses xanax occasionally, ~2 tabs/week depending on stress.   BMI is There is no height or weight on file to calculate BMI., she has been working on diet.  Wt Readings from Last 3 Encounters:  11/29/17 164 lb (74.4 kg)  09/06/17 174 lb 6.4 oz (79.1 kg)  08/25/17 177 lb (80.3 kg)   Her blood pressure has been controlled at home, today their BP is    She does not workout, but watches her 11 y/o grandchild and consequently very acitve She denies chest pain, shortness of breath, dizziness.   She is on cholesterol medication (rosuvastatin 10 mg every other day) and denies myalgias. Her cholesterol is not at goal. The cholesterol last visit was:   Lab Results  Component Value Date   CHOL 183 11/29/2017   HDL 51 11/29/2017   LDLCALC 97 11/29/2017   TRIG 229 (H) 11/29/2017   CHOLHDL 3.6 11/29/2017    She has been working on diet and exercise for T2DM (has been referred to Dr. Rocco Pauls for management though with very poor follow through by patient, currently taking metformin XR 500 mg once daily ***, was discussing adding onglyza), and denies  foot ulcerations, increased appetite, nausea, paresthesia of the feet, polydipsia, polyuria, visual disturbances, vomiting and weight loss. She does check fasting sugars, reports currently running 250-300 in the AM  (worse than previous). Last A1C in the office was:  Lab Results  Component Value Date   HGBA1C 13.8 (H) 11/29/2017   Patient is not on Vitamin D supplement.   Lab Results  Component Value Date   VD25OH 39 04/14/2016        Current Medications:  Current Outpatient Medications on File Prior to Visit  Medication Sig  . acetaminophen (TYLENOL) 500 MG tablet Take 1,000 mg by mouth daily as needed for moderate pain or headache.  . albuterol (PROVENTIL HFA;VENTOLIN HFA) 108 (90 Base) MCG/ACT inhaler  Use 1 to 2 Inhalations 15 minutes apart every 4 hours to Rescue Asthma  . ALPRAZolam (XANAX) 0.5 MG tablet TAKE 1 TABLET (0.5 MG TOTAL) BY MOUTH 2 (TWO) TIMES DAILY AS NEEDED.  Marland Kitchen amitriptyline (ELAVIL) 50 MG tablet TAKE 1/2-1 TABLET AT NIGHT FOR SLEEP AS NEEDED  . aspirin 81 MG tablet Take 81 mg by mouth daily.  . CHOLECALCIFEROL PO Take 5,000 Units by mouth daily.  Marland Kitchen escitalopram (LEXAPRO) 20 MG tablet TAKE 1 TABLET BY MOUTH EVERY DAY  . fluticasone (FLONASE) 50 MCG/ACT nasal spray PLACE 2 SPRAYS INTO BOTH NOSTRILS DAILY.  Marland Kitchen ibuprofen (ADVIL,MOTRIN) 800 MG tablet Take 1 tablet (800 mg total) by mouth every 8 (eight) hours as needed.  Marland Kitchen levocetirizine (XYZAL) 5 MG tablet TAKE 1 TABLET BY MOUTH EVERY DAY  . lisdexamfetamine (VYVANSE) 30 MG capsule Take 1 capsule (30 mg total) by mouth every morning.  Marland Kitchen losartan (COZAAR) 100 MG tablet TAKE 1/2-1 TABLET DAILY FOR BLOOD PRESSURE AND KIDNEY PROTECTION FROM DM  . metFORMIN (GLUCOPHAGE-XR) 500 MG 24 hr tablet Take one tablet twice a day with meals.  Marland Kitchen olopatadine (PATANOL) 0.1 % ophthalmic solution PLACE 1 DROP INTO BOTH EYES 2 (TWO) TIMES DAILY.  Marland Kitchen predniSONE (DELTASONE) 20 MG tablet Take one pill two times daily for 3 days, take one pill daily for 4 days.  . promethazine-dextromethorphan (PROMETHAZINE-DM) 6.25-15 MG/5ML syrup Take 5 mLs by mouth 4 (four) times daily as needed for cough.  . rosuvastatin (CRESTOR) 20 MG tablet TAKE 1 TABLET (20 MG  TOTAL) BY MOUTH EVERY OTHER DAY.   Current Facility-Administered Medications on File Prior to Visit  Medication  . 0.9 %  sodium chloride infusion     Allergies:  Allergies  Allergen Reactions  . Tape Rash  . Ace Inhibitors Cough  . Metformin And Related     myalgia  . Morphine And Related Itching  . Penicillins     Pt states that in 3rd grade she was given an injection in the buttock and reacted with a large hive in that area Pt can take oral penicillin with no reaction    . Wellbutrin [Bupropion]     Felt funny     Medical History:  Past Medical History:  Diagnosis Date  . ADD (attention deficit disorder)   . Anxiety   . Asthma    with severe allergies pt does use albuterol inhaler when needed  . Depression   . Diabetes mellitus without complication ([place]) 0109   TYPE 2   . GERD (gastroesophageal reflux disease)    hx of had Nissen Fundoplication 3235 has not had any issues since  . History of hiatal hernia   . Hyperlipidemia 2004  . Hypertension   . IBS (irritable bowel  syndrome)   . Lynch syndrome   . Tubular adenoma of colon    Family history- Reviewed and unchanged Social history- Reviewed and unchanged   Review of Systems:  Review of Systems  Constitutional: Negative for malaise/fatigue and weight loss.  HENT: Negative for hearing loss and tinnitus.   Eyes: Negative for blurred vision and double vision.  Respiratory: Negative for cough, shortness of breath and wheezing.   Cardiovascular: Negative for chest pain, palpitations, orthopnea, claudication and leg swelling.  Gastrointestinal: Negative for abdominal pain, blood in stool, constipation, diarrhea, heartburn, melena, nausea and vomiting.  Genitourinary: Negative.   Musculoskeletal: Negative for joint pain and myalgias.  Skin: Negative for rash.  Neurological: Negative for dizziness, tingling, sensory change, weakness and headaches.  Endo/Heme/Allergies: Negative for polydipsia.   Psychiatric/Behavioral: Positive for depression. The patient is nervous/anxious and has insomnia.   All other systems reviewed and are negative.   Physical Exam: LMP  (LMP Unknown)  Wt Readings from Last 3 Encounters:  11/29/17 164 lb (74.4 kg)  09/06/17 174 lb 6.4 oz (79.1 kg)  08/25/17 177 lb (80.3 kg)   General Appearance: Well nourished, in no apparent distress. Eyes: PERRLA, EOMs, conjunctiva no swelling or erythema Sinuses: No Frontal/maxillary tenderness ENT/Mouth: Ext aud canals clear, TMs without erythema, bulging. No erythema, swelling, or exudate on post pharynx.  Tonsils not swollen or erythematous. Hearing normal.  Neck: Supple, thyroid normal.  Respiratory: Respiratory effort normal, BS equal bilaterally without rales, rhonchi, wheezing or stridor.  Cardio: sinus tachy with no MRGs. Brisk peripheral pulses without edema.  Abdomen: Soft, + BS.  Non tender, no guarding, rebound, hernias, masses. Lymphatics: Non tender without lymphadenopathy.  Musculoskeletal: Full ROM, 5/5 strength, Normal gait Skin: Warm, dry without rashes, lesions, ecchymosis.  Neuro: Cranial nerves intact. No cerebellar symptoms.  Psych: Awake and oriented X 3, normal affect, Insight and Judgment appropriate.    Izora Ribas, NP 8:58 AM [facility]

## 2018-03-02 ENCOUNTER — Ambulatory Visit: Payer: 59 | Admitting: Adult Health

## 2018-03-02 ENCOUNTER — Other Ambulatory Visit: Payer: Self-pay | Admitting: Adult Health

## 2018-03-02 ENCOUNTER — Ambulatory Visit: Payer: Self-pay | Admitting: Adult Health

## 2018-03-02 ENCOUNTER — Encounter: Payer: Self-pay | Admitting: Adult Health

## 2018-03-02 ENCOUNTER — Telehealth: Payer: Self-pay | Admitting: Adult Health

## 2018-03-02 VITALS — BP 114/68 | HR 102 | Temp 97.3°F | Ht 64.0 in | Wt 157.8 lb

## 2018-03-02 DIAGNOSIS — Z79899 Other long term (current) drug therapy: Secondary | ICD-10-CM

## 2018-03-02 DIAGNOSIS — E559 Vitamin D deficiency, unspecified: Secondary | ICD-10-CM

## 2018-03-02 DIAGNOSIS — E119 Type 2 diabetes mellitus without complications: Secondary | ICD-10-CM | POA: Diagnosis not present

## 2018-03-02 DIAGNOSIS — I10 Essential (primary) hypertension: Secondary | ICD-10-CM | POA: Diagnosis not present

## 2018-03-02 DIAGNOSIS — E1169 Type 2 diabetes mellitus with other specified complication: Secondary | ICD-10-CM

## 2018-03-02 DIAGNOSIS — E785 Hyperlipidemia, unspecified: Secondary | ICD-10-CM

## 2018-03-02 DIAGNOSIS — F988 Other specified behavioral and emotional disorders with onset usually occurring in childhood and adolescence: Secondary | ICD-10-CM | POA: Diagnosis not present

## 2018-03-02 DIAGNOSIS — E663 Overweight: Secondary | ICD-10-CM

## 2018-03-02 MED ORDER — DULAGLUTIDE 0.75 MG/0.5ML ~~LOC~~ SOAJ: 1.0000 "pen " | SUBCUTANEOUS | 0 refills | Status: DC

## 2018-03-02 MED ORDER — LISDEXAMFETAMINE DIMESYLATE 30 MG PO CAPS: 30.0000 mg | ORAL_CAPSULE | ORAL | 0 refills | Status: DC

## 2018-03-02 MED ORDER — METFORMIN HCL ER 500 MG PO TB24: ORAL_TABLET | ORAL | 1 refills | Status: DC

## 2018-03-02 MED ORDER — FLUCONAZOLE 150 MG PO TABS: 150.0000 mg | ORAL_TABLET | Freq: Once | ORAL | 3 refills | Status: AC

## 2018-03-02 NOTE — Telephone Encounter (Signed)
Patient requesting Yeast Infection medicine sent to [facility]

## 2018-03-02 NOTE — Patient Instructions (Addendum)
Goals    . HEMOGLOBIN A1C < 8       Know what a healthy weight is for you (roughly BMI <25) and aim to maintain this  Aim for 7+ servings of fruits and vegetables daily  65-80+ fluid ounces of water or unsweet tea for healthy kidneys  Limit to max 1 drink of alcohol per day; avoid smoking/tobacco  Limit animal fats in diet for cholesterol and heart health - choose grass fed whenever available  Avoid highly processed foods, and foods high in saturated/trans fats  Aim for low stress - take time to unwind and care for your mental health  Aim for 150 min of moderate intensity exercise weekly for heart health, and weights twice weekly for bone health  Aim for 7-9 hours of sleep daily      When it comes to diets, agreement about the perfect plan isn't easy to find, even among the experts. Experts at the [employer] developed an idea known as the Healthy Eating Plate. Just imagine a plate divided into logical, healthy portions.  The emphasis is on diet quality:  Load up on vegetables and fruits - one-half of your plate: Aim for color and variety, and remember that potatoes don't count.  Go for whole grains - one-quarter of your plate: Whole wheat, barley, wheat berries, quinoa, oats, [place] rice, and foods made with them. If you want pasta, go with whole wheat pasta.  Protein power - one-quarter of your plate: Fish, chicken, beans, and nuts are all healthy, versatile protein sources. Limit red meat.  The diet, however, does go beyond the plate, offering a few other suggestions.  Use healthy plant oils, such as olive, canola, soy, corn, sunflower and peanut. Check the labels, and avoid partially hydrogenated oil, which have unhealthy trans fats.  If you're thirsty, drink water. Coffee and tea are good in moderation, but skip sugary drinks and limit milk and dairy products to one or two daily servings.  The type of carbohydrate in the diet is more important than  the amount. Some sources of carbohydrates, such as vegetables, fruits, whole grains, and beans-are healthier than others.  Finally, stay active.    Dulaglutide injection What is this medicine? DULAGLUTIDE (DOO la GLOO tide) is used to improve blood sugar control in adults with type 2 diabetes. This medicine may be used with other oral diabetes medicines. This medicine may be used for other purposes; ask your health care provider or pharmacist if you have questions. COMMON BRAND NAME(S): TRULICITY What should I tell my health care provider before I take this medicine? They need to know if you have any of these conditions: -endocrine tumors (MEN 2) or if someone in your family had these tumors -history of pancreatitis -kidney disease -liver disease -stomach problems -thyroid cancer or if someone in your family had thyroid cancer -an unusual or allergic reaction to dulaglutide, other medicines, foods, dyes, or preservatives -pregnant or trying to get pregnant -breast-feeding How should I use this medicine? This medicine is for injection under the skin of your upper leg (thigh), stomach area, or upper arm. It is usually given once every week (every 7 days). You will be taught how to prepare and give this medicine. Use exactly as directed. Take your medicine at regular intervals. Do not take it more often than directed. If you use this medicine with insulin, you should inject this medicine and the insulin separately. Do not mix them together. Do not give the  injections right next to each other. Change (rotate) injection sites with each injection. It is important that you put your used needles and syringes in a special sharps container. Do not put them in a trash can. If you do not have a sharps container, call your pharmacist or healthcare provider to get one. A special MedGuide will be given to you by the pharmacist with each prescription and refill. Be sure to read this information carefully  each time. Talk to your pediatrician regarding the use of this medicine in children. Special care may be needed. Overdosage: If you think you have taken too much of this medicine contact a poison control center or emergency room at once. NOTE: This medicine is only for you. Do not share this medicine with others. What if I miss a dose? If you miss a dose, take it as soon as you can within 3 days after the missed dose. Then take your next dose at your regular weekly time. If it has been longer than 3 days after the missed dose, do not take the missed dose. Take the next dose at your regular time. Do not take double or extra doses. If you have questions about a missed dose, contact your health care provider for advice. What may interact with this medicine? -other medicines for diabetes Many medications may cause changes in blood sugar, these include: -alcohol containing beverages -antiviral medicines for HIV or AIDS -aspirin and aspirin-like drugs -certain medicines for blood pressure, heart disease, irregular heart beat -chromium -diuretics -female hormones, such as estrogens or progestins, birth control pills -fenofibrate -gemfibrozil -isoniazid -lanreotide -female hormones or anabolic steroids -MAOIs like Carbex, Eldepryl, Marplan, Nardil, and Parnate -medicines for weight loss -medicines for allergies, asthma, cold, or cough -medicines for depression, anxiety, or psychotic disturbances -niacin -nicotine -NSAIDs, medicines for pain and inflammation, like ibuprofen or naproxen -octreotide -pasireotide -pentamidine -phenytoin -probenecid -quinolone antibiotics such as ciprofloxacin, levofloxacin, ofloxacin -some herbal dietary supplements -steroid medicines such as prednisone or cortisone -sulfamethoxazole; trimethoprim -thyroid hormones Some medications can hide the warning symptoms of low blood sugar (hypoglycemia). You may need to monitor your blood sugar more closely if you are  taking one of these medications. These include: -beta-blockers, often used for high blood pressure or heart problems (examples include atenolol, metoprolol, propranolol) -clonidine -guanethidine -reserpine This list may not describe all possible interactions. Give your health care provider a list of all the medicines, herbs, non-prescription drugs, or dietary supplements you use. Also tell them if you smoke, drink alcohol, or use illegal drugs. Some items may interact with your medicine. What should I watch for while using this medicine? Visit your doctor or health care professional for regular checks on your progress. Drink plenty of fluids while taking this medicine. Check with your doctor or health care professional if you get an attack of severe diarrhea, nausea, and vomiting. The loss of too much body fluid can make it dangerous for you to take this medicine. A test called the HbA1C (A1C) will be monitored. This is a simple blood test. It measures your blood sugar control over the last 2 to 3 months. You will receive this test every 3 to 6 months. Learn how to check your blood sugar. Learn the symptoms of low and high blood sugar and how to manage them. Always carry a quick-source of sugar with you in case you have symptoms of low blood sugar. Examples include hard sugar candy or glucose tablets. Make sure others know that you can choke  if you eat or drink when you develop serious symptoms of low blood sugar, such as seizures or unconsciousness. They must get medical help at once. Tell your doctor or health care professional if you have high blood sugar. You might need to change the dose of your medicine. If you are sick or exercising more than usual, you might need to change the dose of your medicine. Do not skip meals. Ask your doctor or health care professional if you should avoid alcohol. Many nonprescription cough and cold products contain sugar or alcohol. These can affect blood sugar. Pens  should never be shared. Even if the needle is changed, sharing may result in passing of viruses like hepatitis or HIV. Wear a medical ID bracelet or chain, and carry a card that describes your disease and details of your medicine and dosage times. What side effects may I notice from receiving this medicine? Side effects that you should report to your doctor or health care professional as soon as possible: -allergic reactions like skin rash, itching or hives, swelling of the face, lips, or tongue -breathing problems -diarrhea that continues or is severe -lump or swelling on the neck -severe nausea -signs and symptoms of infection like fever or chills; cough; sore throat; pain or trouble passing urine -signs and symptoms of low blood sugar such as feeling anxious, confusion, dizziness, increased hunger, unusually weak or tired, sweating, shakiness, cold, irritable, headache, blurred vision, fast heartbeat, loss of consciousness -signs and symptoms of kidney injury like trouble passing urine or change in the amount of urine -trouble swallowing -unusual stomach upset or pain -vomiting Side effects that usually do not require medical attention (report to your doctor or health care professional if they continue or are bothersome): -diarrhea -loss of appetite -nausea -pain, redness, or irritation at site where injected -stomach upset This list may not describe all possible side effects. Call your doctor for medical advice about side effects. You may report side effects to FDA at 1-800-FDA-1088. Where should I keep my medicine? Keep out of the reach of children. Store unopened pens in a refrigerator between 2 and 8 degrees C (36 and 46 degrees F). Do not freeze or use if the medicine has been frozen. Protect from light and excessive heat. Store in the carton until use. Each single-dose pen can be kept at room temperature, not to exceed 30 degrees C (86 degrees F) for a total of 14 days, if needed.  Throw away any unused medicine after the expiration date on the label. NOTE: This sheet is a summary. It may not cover all possible information. If you have questions about this medicine, talk to your doctor, pharmacist, or health care provider.  2018 Elsevier/Gold Standard (2016-04-09 14:35:01)

## 2018-03-02 NOTE — Progress Notes (Signed)
FOLLOW UP  Assessment and Plan:   Hypertension Well controlled with current medications  Monitor blood pressure at home; patient to call if consistently greater than 130/80 Continue DASH diet.   Reminder to go to the ER if any CP, SOB, nausea, dizziness, severe HA, changes vision/speech, left arm numbness and tingling and jaw pain.  Cholesterol Currently near goal; currently taking 10 mg rosuvastatin every other day, advised increase to 20 mg every other day Continue low cholesterol diet and exercise.  Check lipid panel.   Diabetes without complications Managed by endocrinology, encouraged her to schedule follow up Continue medication: increase to metformin XR 500 mg x 4 daily; restart trulicity 0.01 mg weekly, will titrate to 1.5 mg dose in 1 month. Continue diet and exercise.  Perform daily foot/skin check, notify office of any concerning changes.  Check A1C  Overweight Long discussion about weight loss, diet, and exercise Recommended diet heavy in fruits and veggies and low in animal meats, cheeses, and dairy products, appropriate calorie intake Discussed ideal weight for height Will follow up in 3 months  Vitamin D Def Below goal at last visit; taking 5000 IU daily  check Vit D level  Depression/anxiety Continue medications  Lifestyle discussed: diet/exerise, sleep hygiene, stress management, hydration  ADD Changed vyvanse to 30 mg with improved sleep Helps with focus, no AE's. The patient was counseled on the addictive nature of the medication and was encouraged to take drug holidays when not needed.    Continue diet and meds as discussed. Further disposition pending results of labs. Discussed med's effects and SE's.   Over 30 minutes of exam, counseling, chart review, and critical decision making was performed.   Future Appointments  Date Time Provider [place]  04/11/2018 10:00 AM Vicie Mutters, PA-C [id number] None     ----------------------------------------------------------------------------------------------------------------------  HPI 62 y.o. female  presents for 3 month follow up on hypertension, cholesterol, diabetes, weight and vitamin D deficiency.   She has depression/anxiety and ADD and is currently on lexapro, xanax 0.5 mg BID PRN, vyvanse 30 mg daily, doing better with this dose.  she currently uses xanax occasionally, ~2 tabs/week depending on stress.   BMI is Body mass index is 27.09 kg/m., she has been working on diet, watches her grandson but otherwise not exercise.   Wt Readings from Last 3 Encounters:  03/02/18 157 lb 12.8 oz (71.6 kg)  11/29/17 164 lb (74.4 kg)  09/06/17 174 lb 6.4 oz (79.1 kg)   Her blood pressure has been controlled at home, today their BP is BP: 114/68  She does not workout, but watches her 45 y/o grandchild and consequently very acitve She denies chest pain, shortness of breath, dizziness.   She is on cholesterol medication (rosuvastatin 10 mg every other day) and denies myalgias. Her cholesterol is not at goal. The cholesterol last visit was:   Lab Results  Component Value Date   CHOL 183 11/29/2017   HDL 51 11/29/2017   LDLCALC 97 11/29/2017   TRIG 229 (H) 11/29/2017   CHOLHDL 3.6 11/29/2017    She has been working on diet and exercise for T2DM (has been referred to Dr. Rocco Pauls for management, though hasn't follow up since May 2019, currently taking metformin XR 500 mg BID, was discussing adding onglyza, but never started after sending in last visit, she today reports she was tolerating trulicity well with only mild local injection site redness), and denies foot ulcerations, increased appetite, nausea, paresthesia of the feet and visual disturbances. She  does endorse unintentional weight loss, polyuria, polydipsia. She does check fasting sugars, reports currently running 220-260 in the AM (improved from previous). Last A1C in the office was:  Lab  Results  Component Value Date   HGBA1C 13.8 (H) 11/29/2017   Patient is not on Vitamin D supplement.   Lab Results  Component Value Date   VD25OH 39 04/14/2016       Current Medications:  Current Outpatient Medications on File Prior to Visit  Medication Sig  . acetaminophen (TYLENOL) 500 MG tablet Take 1,000 mg by mouth daily as needed for moderate pain or headache.  . albuterol (PROVENTIL HFA;VENTOLIN HFA) 108 (90 Base) MCG/ACT inhaler  Use 1 to 2 Inhalations 15 minutes apart every 4 hours to Rescue Asthma  . ALPRAZolam (XANAX) 0.5 MG tablet TAKE 1 TABLET (0.5 MG TOTAL) BY MOUTH 2 (TWO) TIMES DAILY AS NEEDED.  Marland Kitchen amitriptyline (ELAVIL) 50 MG tablet TAKE 1/2-1 TABLET AT NIGHT FOR SLEEP AS NEEDED  . aspirin 81 MG tablet Take 81 mg by mouth daily.  . CHOLECALCIFEROL PO Take 5,000 Units by mouth daily.  Marland Kitchen escitalopram (LEXAPRO) 20 MG tablet TAKE 1 TABLET BY MOUTH EVERY DAY  . fluticasone (FLONASE) 50 MCG/ACT nasal spray PLACE 2 SPRAYS INTO BOTH NOSTRILS DAILY.  Marland Kitchen ibuprofen (ADVIL,MOTRIN) 800 MG tablet Take 1 tablet (800 mg total) by mouth every 8 (eight) hours as needed.  Marland Kitchen levocetirizine (XYZAL) 5 MG tablet TAKE 1 TABLET BY MOUTH EVERY DAY  . lisdexamfetamine (VYVANSE) 30 MG capsule Take 1 capsule (30 mg total) by mouth every morning.  Marland Kitchen losartan (COZAAR) 100 MG tablet TAKE 1/2-1 TABLET DAILY FOR BLOOD PRESSURE AND KIDNEY PROTECTION FROM DM  . metFORMIN (GLUCOPHAGE-XR) 500 MG 24 hr tablet Take one tablet twice a day with meals.  Marland Kitchen olopatadine (PATANOL) 0.1 % ophthalmic solution PLACE 1 DROP INTO BOTH EYES 2 (TWO) TIMES DAILY.  Marland Kitchen promethazine-dextromethorphan (PROMETHAZINE-DM) 6.25-15 MG/5ML syrup Take 5 mLs by mouth 4 (four) times daily as needed for cough.  . rosuvastatin (CRESTOR) 20 MG tablet TAKE 1 TABLET (20 MG TOTAL) BY MOUTH EVERY OTHER DAY.   Current Facility-Administered Medications on File Prior to Visit  Medication  . 0.9 %  sodium chloride infusion     Allergies:   Allergies  Allergen Reactions  . Tape Rash  . Ace Inhibitors Cough  . Metformin And Related     myalgia  . Morphine And Related Itching  . Penicillins     Pt states that in 3rd grade she was given an injection in the buttock and reacted with a large hive in that area Pt can take oral penicillin with no reaction    . Wellbutrin [Bupropion]     Felt funny     Medical History:  Past Medical History:  Diagnosis Date  . ADD (attention deficit disorder)   . Anxiety   . Asthma    with severe allergies pt does use albuterol inhaler when needed  . Depression   . Diabetes mellitus without complication ([place]) 7628   TYPE 2   . GERD (gastroesophageal reflux disease)    hx of had Nissen Fundoplication 3151 has not had any issues since  . History of hiatal hernia   . Hyperlipidemia 2004  . Hypertension   . IBS (irritable bowel syndrome)   . Lynch syndrome   . Tubular adenoma of colon    Family history- Reviewed and unchanged Social history- Reviewed and unchanged   Review of Systems:  Review of  Systems  Constitutional: Negative for malaise/fatigue and weight loss.  HENT: Negative for hearing loss and tinnitus.   Eyes: Negative for blurred vision and double vision.  Respiratory: Negative for cough, shortness of breath and wheezing.   Cardiovascular: Negative for chest pain, palpitations, orthopnea, claudication and leg swelling.  Gastrointestinal: Negative for abdominal pain, blood in stool, constipation, diarrhea, heartburn, melena, nausea and vomiting.  Genitourinary: Negative.   Musculoskeletal: Negative for joint pain and myalgias.  Skin: Negative for rash.  Neurological: Negative for dizziness, tingling, sensory change, weakness and headaches.  Endo/Heme/Allergies: Negative for polydipsia.  Psychiatric/Behavioral: Positive for depression. The patient is nervous/anxious and has insomnia.   All other systems reviewed and are negative.   Physical Exam: BP 114/68   Pulse  (!) 102   Temp (!) 97.3 F (36.3 C)   Ht 5\' 4"  (1.626 m)   Wt 157 lb 12.8 oz (71.6 kg)   LMP  (LMP Unknown)   SpO2 97%   BMI 27.09 kg/m  Wt Readings from Last 3 Encounters:  03/02/18 157 lb 12.8 oz (71.6 kg)  11/29/17 164 lb (74.4 kg)  09/06/17 174 lb 6.4 oz (79.1 kg)   General Appearance: Well nourished, in no apparent distress. Eyes: PERRLA, EOMs, conjunctiva no swelling or erythema Sinuses: No Frontal/maxillary tenderness ENT/Mouth: Ext aud canals clear, TMs without erythema, bulging. No erythema, swelling, or exudate on post pharynx.  Tonsils not swollen or erythematous. Hearing normal.  Neck: Supple, thyroid normal.  Respiratory: Respiratory effort normal, BS equal bilaterally without rales, rhonchi, wheezing or stridor.  Cardio: sinus tachy with no MRGs. Brisk peripheral pulses without edema.  Abdomen: Soft, + BS.  Non tender, no guarding, rebound, hernias, masses. Lymphatics: Non tender without lymphadenopathy.  Musculoskeletal: Full ROM, 5/5 strength, Normal gait Skin: Warm, dry without rashes, lesions, ecchymosis.  Neuro: Cranial nerves intact. No cerebellar symptoms.  Psych: Awake and oriented X 3, normal affect, Insight and Judgment appropriate.    Izora Ribas, NP 9:29 AM [facility]

## 2018-03-03 LAB — COMPLETE METABOLIC PANEL WITH GFR
AG Ratio: 1.3 (calc) (ref 1.0–2.5)
ALKALINE PHOSPHATASE (APISO): 122 U/L (ref 33–130)
ALT: 15 U/L (ref 6–29)
AST: 14 U/L (ref 10–35)
Albumin: 4 g/dL (ref 3.6–5.1)
BILIRUBIN TOTAL: 0.5 mg/dL (ref 0.2–1.2)
BUN: 18 mg/dL (ref 7–25)
CO2: 25 mmol/L (ref 20–32)
CREATININE: 0.71 mg/dL (ref 0.50–0.99)
Calcium: 9.9 mg/dL (ref 8.6–10.4)
Chloride: 98 mmol/L (ref 98–110)
GFR, Est African American: 107 mL/min/{1.73_m2} (ref >=60)
GFR, Est Non African American: 92 mL/min/{1.73_m2} (ref >=60)
GLOBULIN: 3.1 g/dL (ref 1.9–3.7)
GLUCOSE: 563 mg/dL — AB (ref 65–99)
Potassium: 4.4 mmol/L (ref 3.5–5.3)
SODIUM: 134 mmol/L — AB (ref 135–146)
Total Protein: 7.1 g/dL (ref 6.1–8.1)

## 2018-03-03 LAB — CBC WITH DIFFERENTIAL/PLATELET
BASOS PCT: 1.8 %
Basophils Absolute: 110 cells/uL (ref 0–200)
EOS ABS: 287 {cells}/uL (ref 15–500)
Eosinophils Relative: 4.7 %
HEMATOCRIT: 45.2 % — AB (ref 35.0–45.0)
Hemoglobin: 14.5 g/dL (ref 11.7–15.5)
LYMPHS ABS: 1891 {cells}/uL (ref 850–3900)
MCH: 27.9 pg (ref 27.0–33.0)
MCHC: 32.1 g/dL (ref 32.0–36.0)
MCV: 86.9 fL (ref 80.0–100.0)
MPV: 11.1 fL (ref 7.5–12.5)
Monocytes Relative: 4.9 %
NEUTROS PCT: 57.6 %
Neutro Abs: 3514 cells/uL (ref 1500–7800)
Platelets: 314 10*3/uL (ref 140–400)
RBC: 5.2 10*6/uL — AB (ref 3.80–5.10)
RDW: 11.8 % (ref 11.0–15.0)
Total Lymphocyte: 31 %
WBC: 6.1 10*3/uL (ref 3.8–10.8)
WBCMIX: 299 {cells}/uL (ref 200–950)

## 2018-03-03 LAB — HEMOGLOBIN A1C
EAG (MMOL/L): 18.1 (calc)
Hgb A1c MFr Bld: 13 % of total Hgb — ABNORMAL HIGH (ref <5.7)
MEAN PLASMA GLUCOSE: 326 (calc)

## 2018-03-03 LAB — LIPID PANEL
CHOLESTEROL: 189 mg/dL (ref <200)
HDL: 55 mg/dL (ref >50)
LDL CHOLESTEROL (CALC): 97 mg/dL
Non-HDL Cholesterol (Calc): 134 mg/dL (calc) — ABNORMAL HIGH (ref <130)
Total CHOL/HDL Ratio: 3.4 (calc) (ref <5.0)
Triglycerides: 241 mg/dL — ABNORMAL HIGH (ref <150)

## 2018-03-03 LAB — TSH: TSH: 2.81 mIU/L (ref 0.40–4.50)

## 2018-03-03 LAB — MAGNESIUM: MAGNESIUM: 1.8 mg/dL (ref 1.5–2.5)

## 2018-03-03 LAB — VITAMIN D 25 HYDROXY (VIT D DEFICIENCY, FRACTURES): Vit D, 25-Hydroxy: 46 ng/mL (ref 30–100)

## 2018-03-07 ENCOUNTER — Other Ambulatory Visit: Payer: Self-pay | Admitting: Adult Health

## 2018-03-07 MED ORDER — INSULIN NPH ISOPHANE & REGULAR (70-30) 100 UNIT/ML ~~LOC~~ SUSP: SUBCUTANEOUS | 4 refills | Status: DC

## 2018-03-08 ENCOUNTER — Other Ambulatory Visit: Payer: Self-pay

## 2018-03-10 ENCOUNTER — Other Ambulatory Visit: Payer: Self-pay

## 2018-03-16 ENCOUNTER — Ambulatory Visit: Payer: Self-pay | Admitting: Adult Health

## 2018-03-18 NOTE — Progress Notes (Signed)
Assessment and Plan:  Cannon was seen today for diabetes mellitus.  Diagnoses and all orders for this visit:  Type 2 diabetes mellitus with hyperglycemia, with long-term current use of insulin ([place]) Continue with current insulin regimen, discussed hypoglycemia plan, information provided Follow up as scheduled, defer starting on dulaglutide for now Goal is to taper off of insulin and transition to oral medications Follow up as scheduled or sooner for concerns with insulin -     Fructosamine -     BASIC METABOLIC PANEL WITH GFR  Overweight (BMI 25.0-29.9) Long discussion about weight loss, diet, and exercise Recommended diet heavy in fruits and veggies and low in animal meats, cheeses, and dairy products, appropriate calorie intake Discussed appropriate weight for height  Follow up at next visit  Acute non-recurrent pansinusitis Suggested symptomatic OTC remedies. Nasal saline spray for congestion. Nasal steroids, allergy pill, oral steroids offered Follow up as needed. -     fluticasone (FLONASE) 50 MCG/ACT nasal spray; Place 2 sprays into both nostrils daily. -     azithromycin (ZITHROMAX) 250 MG tablet; Take 2 tablets (500 mg) on  Day 1,  followed by 1 tablet (250 mg) once daily on Days 2 through 5. -     promethazine-dextromethorphan (PROMETHAZINE-DM) 6.25-15 MG/5ML syrup; Take 5 mLs by mouth 4 (four) times daily as needed for cough.   Further disposition pending results of labs. Discussed med's effects and SE's.   Over 15 minutes of exam, counseling, chart review, and critical decision making was performed.   Future Appointments  Date Time Provider [place]  06/13/2018  2:00 PM Vicie Mutters, PA-C [id number] None    ------------------------------------------------------------------------------------------------------------------   HPI BP 114/72   Pulse 97   Temp (!) 97.5 F (36.4 C)   Ht 5\' 4"  (1.626 m)   Wt 164 lb (74.4 kg)   LMP  (LMP Unknown)   SpO2 98%    BMI 28.15 kg/m   62 y.o.female with T2DM presents for close 1-2 week follow up after initiation of insulin for hyperglycemia  She is on metformin 6945 mg, trulicity, and newly on novolin 70/30, taking 25 units in the AM, and 20 units at night; she has glucose log today, checking twice daily. Fasting ranging from 120-160 with rare outlier higher than this, and taking prior to dinner, ranging from 89-low 200s. She denies any hypoglycemia or symptoms.     She has been working on diet and exercise, has cut out sweet tea, and denies hyperglycemia, increased appetite, nausea, paresthesia of the feet, polydipsia, polyuria, visual disturbances and vomiting. Last A1C in the office was:  Lab Results  Component Value Date   HGBA1C 13.0 (H) 03/02/2018    She also reports 1 week of nasal congestion, facial pressure, R ear pressure, productive cough without fever/chills. She is on daily allergy medication, saline nasal irrigations.    Past Medical History:  Diagnosis Date  . ADD (attention deficit disorder)   . Anxiety   . Asthma    with severe allergies pt does use albuterol inhaler when needed  . Depression   . Diabetes mellitus without complication ([place]) 0388   TYPE 2   . GERD (gastroesophageal reflux disease)    hx of had Nissen Fundoplication 8280 has not had any issues since  . History of hiatal hernia   . Hyperlipidemia 2004  . Hypertension   . IBS (irritable bowel syndrome)   . Lynch syndrome   . Tubular adenoma of colon  Allergies  Allergen Reactions  . Tape Rash  . Ace Inhibitors Cough  . Metformin And Related     myalgia  . Morphine And Related Itching  . Penicillins     Pt states that in 3rd grade she was given an injection in the buttock and reacted with a large hive in that area Pt can take oral penicillin with no reaction    . Wellbutrin [Bupropion]     Felt funny    Current Outpatient Medications on File Prior to Visit  Medication Sig  . acetaminophen  (TYLENOL) 500 MG tablet Take 1,000 mg by mouth daily as needed for moderate pain or headache.  . albuterol (PROVENTIL HFA;VENTOLIN HFA) 108 (90 Base) MCG/ACT inhaler  Use 1 to 2 Inhalations 15 minutes apart every 4 hours to Rescue Asthma (Patient taking differently: as needed. )  . ALPRAZolam (XANAX) 0.5 MG tablet TAKE 1 TABLET (0.5 MG TOTAL) BY MOUTH 2 (TWO) TIMES DAILY AS NEEDED.  Marland Kitchen amitriptyline (ELAVIL) 50 MG tablet TAKE 1/2-1 TABLET AT NIGHT FOR SLEEP AS NEEDED  . aspirin 81 MG tablet Take 81 mg by mouth daily.  . CHOLECALCIFEROL PO Take 5,000 Units by mouth daily.  . Dulaglutide (TRULICITY) 5.03 TW/6.5KC SOPN Inject 1 pen into the skin once a week.  . escitalopram (LEXAPRO) 20 MG tablet TAKE 1 TABLET BY MOUTH EVERY DAY  . fluticasone (FLONASE) 50 MCG/ACT nasal spray PLACE 2 SPRAYS INTO BOTH NOSTRILS DAILY. (Patient taking differently: Place 2 sprays into both nostrils as needed. )  . ibuprofen (ADVIL,MOTRIN) 800 MG tablet Take 1 tablet (800 mg total) by mouth every 8 (eight) hours as needed.  . insulin NPH-regular Human (NOVOLIN 70/30) (70-30) 100 UNIT/ML injection Inject 15 units in the AM [place] with food, and inject 10 units [place] with evening meal. (Patient taking differently: Inject 25 units in the am and 20 units at dinner)  . levocetirizine (XYZAL) 5 MG tablet TAKE 1 TABLET BY MOUTH EVERY DAY  . lisdexamfetamine (VYVANSE) 30 MG capsule Take 1 capsule (30 mg total) by mouth every morning.  Marland Kitchen losartan (COZAAR) 100 MG tablet TAKE 1/2-1 TABLET DAILY FOR BLOOD PRESSURE AND KIDNEY PROTECTION FROM DM  . metFORMIN (GLUCOPHAGE-XR) 500 MG 24 hr tablet Take 1 tab with breakfast and lunch, 2 tabs with dinner or largest meal of the day.  . olopatadine (PATANOL) 0.1 % ophthalmic solution PLACE 1 DROP INTO BOTH EYES 2 (TWO) TIMES DAILY.  Marland Kitchen promethazine-dextromethorphan (PROMETHAZINE-DM) 6.25-15 MG/5ML syrup Take 5 mLs by mouth 4 (four) times daily as needed for cough.  . rosuvastatin (CRESTOR) 20 MG tablet  TAKE 1 TABLET (20 MG TOTAL) BY MOUTH EVERY OTHER DAY.   Current Facility-Administered Medications on File Prior to Visit  Medication  . 0.9 %  sodium chloride infusion    ROS: all negative except above.   Physical Exam:  BP 114/72   Pulse 97   Temp (!) 97.5 F (36.4 C)   Ht 5\' 4"  (1.626 m)   Wt 164 lb (74.4 kg)   LMP  (LMP Unknown)   SpO2 98%   BMI 28.15 kg/m   General Appearance: Well nourished, in no apparent distress. Eyes: PERRLA, EOMs, conjunctiva no swelling or erythema Sinuses: Generalized frontal/maxillary sinus tenderness.  ENT/Mouth: Ext aud canals clear, TMs without erythema, bulging. No erythema, swelling, or exudate on post pharynx.  Tonsils not swollen or erythematous. Hearing normal.  Neck: Supple, thyroid normal.  Respiratory: Respiratory effort normal, BS equal bilaterally without rales, rhonchi, wheezing  or stridor.  Cardio: RRR with no MRGs. Brisk peripheral pulses without edema.  Abdomen: Soft, + BS.  Non tender, no guarding, rebound, hernias, masses. Lymphatics: Non tender without lymphadenopathy.  Musculoskeletal: Full ROM, 5/5 strength, normal gait.  Skin: Warm, dry without rashes, lesions, ecchymosis.  Neuro: Cranial nerves intact. Normal muscle tone, no cerebellar symptoms. Sensation intact.  Psych: Awake and oriented X 3, normal affect, Insight and Judgment appropriate.     Izora Ribas, NP 3:48 PM [facility]

## 2018-03-21 ENCOUNTER — Ambulatory Visit: Payer: 59 | Admitting: Adult Health

## 2018-03-21 ENCOUNTER — Encounter: Payer: Self-pay | Admitting: Adult Health

## 2018-03-21 VITALS — BP 114/72 | HR 97 | Temp 97.5°F | Ht 64.0 in | Wt 164.0 lb

## 2018-03-21 DIAGNOSIS — E663 Overweight: Secondary | ICD-10-CM

## 2018-03-21 DIAGNOSIS — E1165 Type 2 diabetes mellitus with hyperglycemia: Secondary | ICD-10-CM

## 2018-03-21 DIAGNOSIS — J069 Acute upper respiratory infection, unspecified: Secondary | ICD-10-CM | POA: Diagnosis not present

## 2018-03-21 DIAGNOSIS — J0140 Acute pansinusitis, unspecified: Secondary | ICD-10-CM

## 2018-03-21 DIAGNOSIS — Z794 Long term (current) use of insulin: Secondary | ICD-10-CM

## 2018-03-21 MED ORDER — FLUTICASONE PROPIONATE 50 MCG/ACT NA SUSP: 2.0000 | Freq: Every day | NASAL | 1 refills | Status: DC

## 2018-03-21 MED ORDER — PROMETHAZINE-DM 6.25-15 MG/5ML PO SYRP: 5.0000 mL | ORAL_SOLUTION | Freq: Four times a day (QID) | ORAL | 1 refills | Status: DC | PRN

## 2018-03-21 MED ORDER — AZITHROMYCIN 250 MG PO TABS: ORAL_TABLET | ORAL | 1 refills | Status: AC

## 2018-03-21 NOTE — Patient Instructions (Signed)
Targets for Glucose Readings: Time of Check Usual Target for Most People  Before Meals  70-130  Two hours after meals  Less than 180  Bedtime  90-150   Why Should You Check Your Blood Glucose? -The A1C tells you how your diabetes is doing over a 3 month period.  Home blood glucose monitoring (or checking) gives you information about your diabetes on a daily basis.  You will learn how well your diabetes care plan is working and whether your blood glucose is in your target range throughout the day.   -Reviewing daily blood glucose levels will help you and your healthcare team make any needed changes to you meal plan, physical activity and medications.  Meter Supplies: - A glucose meter was provided at your visit today along with strips and lancets. The blood glucose meter strips and lancets are usually covered by health insurance. Please let us know if they are not and contact your insurance to see which meter they prefer.  How to get a good blood sample: -Wash your hands in warm water.  You do not need to use alcohol wipes. -Massage your hands. -Choose which finger you will use.  It helps to use a different finger each time to avoid soreness. -Keep you hand below your wrist when using you lancet to "prick" your finger. -Apply gentle pressure, but do NOT squeeze your finger.  Checking your blood glucose Important Reminders: -Make sure your strips are not expired.  Check the date on the bottle. -Make sure the code on bottle matches the code on your machine. -Make sure your hands are clean and dry. -Do not use the center of your finger, it is the most sensitive area.  Use a spot to the side of the center of your fingertip. -Completely fill the strip target area with blood (until it beeps) to make sure the results are accurate. -You will need a prescription to have your glucose strips and lancets covered by insurance. -There is usually an 800 number on the back of your meter for help with  meter issues.  How often to check: -If you take diabetes pills or take one injection of insulin each day, you will usually be asked to check twice a day, before breakfast and 2 hours after one meal, or as directed. -If you take several insulin injections each day, you will usually be asked to check four times a day before meals and at bedtime every day.     Hypoglycemia Hypoglycemia occurs when the level of sugar (glucose) in the blood is too low. Glucose is a type of sugar that provides the body's main source of energy. Certain hormones (insulin and glucagon) control the level of glucose in the blood. Insulin lowers blood glucose, and glucagon increases blood glucose. Hypoglycemia can result from having too much insulin in the bloodstream, or from not eating enough food that contains glucose. Hypoglycemia can happen in people who do or do not have diabetes. It can develop quickly, and it can be a medical emergency. What are the causes? Hypoglycemia occurs most often in people who have diabetes. If you have diabetes, hypoglycemia may be caused by:  Diabetes medicine.  Not eating enough, or not eating often enough.  Increased physical activity.  Drinking alcohol, especially when you have not eaten recently.  If you do not have diabetes, hypoglycemia may be caused by:  A tumor in the pancreas. The pancreas is the organ that makes insulin.  Not eating enough, or not  eating for long periods at a time (fasting).  Severe infection or illness that affects the liver, heart, or kidneys.  Certain medicines.  You may also have reactive hypoglycemia. This condition causes hypoglycemia within 4 hours of eating a meal. This may occur after having stomach surgery. Sometimes, the cause of reactive hypoglycemia is not known. What increases the risk? Hypoglycemia is more likely to develop in:  People who have diabetes and take medicines to lower blood glucose.  People who abuse  alcohol.  People who have a severe illness.  What are the signs or symptoms? Hypoglycemia may not cause any symptoms. If you have symptoms, they may include:  Hunger.  Anxiety.  Sweating and feeling clammy.  Confusion.  Dizziness or feeling light-headed.  Sleepiness.  Nausea.  Increased heart rate.  Headache.  Blurry vision.  Seizure.  Nightmares.  Tingling or numbness around the mouth, lips, or tongue.  A change in speech.  Decreased ability to concentrate.  A change in coordination.  Restless sleep.  Tremors or shakes.  Fainting.  Irritability.  How is this diagnosed? Hypoglycemia is diagnosed with a blood test to measure your blood glucose level. This blood test is done while you are having symptoms. Your health care provider may also do a physical exam and review your medical history. If you do not have diabetes, other tests may be done to find the cause of your hypoglycemia. How is this treated? This condition can often be treated by immediately eating or drinking something that contains glucose, such as:  3-4 sugar tablets (glucose pills).  Glucose gel, 15-gram tube.  Fruit juice, 4 oz (120 mL).  Regular soda (not diet soda), 4 oz (120 mL).  Low-fat milk, 4 oz (120 mL).  Several pieces of hard candy.  Sugar or honey, 1 Tbsp.  Treating Hypoglycemia If You Have Diabetes  If you are alert and able to swallow safely, follow the 15:15 rule:  Take 15 grams of a rapid-acting carbohydrate. Rapid-acting options include: ? 1 tube of glucose gel. ? 3 glucose pills. ? 6-8 pieces of hard candy. ? 4 oz (120 mL) of fruit juice. ? 4 oz (120 ml) of regular (not diet) soda.  Check your blood glucose 15 minutes after you take the carbohydrate.  If the repeat blood glucose level is still at or below 70 mg/dL (3.9 mmol/L), take 15 grams of a carbohydrate again.  If your blood glucose level does not increase above 70 mg/dL (3.9 mmol/L) after 3 tries,  seek emergency medical care.  After your blood glucose level returns to normal, eat a meal or a snack within 1 hour.  Treating Severe Hypoglycemia Severe hypoglycemia is when your blood glucose level is at or below 54 mg/dL (3 mmol/L). Severe hypoglycemia is an emergency. Do not wait to see if the symptoms will go away. Get medical help right away. Call your local emergency services (911 in the [place] Do not drive yourself to the hospital. If you have severe hypoglycemia and you cannot eat or drink, you may need an injection of glucagon. A family member or close friend should learn how to check your blood glucose and how to give you a glucagon injection. Ask your health care provider if you need to have an emergency glucagon injection kit available. Severe hypoglycemia may need to be treated in a hospital. The treatment may include getting glucose through an IV tube. You may also need treatment for the cause of your hypoglycemia. Follow these instructions at  home: General instructions  Avoid any diets that cause you to not eat enough food. Talk with your health care provider before you start any new diet.  Take over-the-counter and prescription medicines only as told by your health care provider.  Limit alcohol intake to no more than 1 drink per day for nonpregnant women and 2 drinks per day for men. One drink equals 12 oz of beer, 5 oz of wine, or 1 oz of hard liquor.  Keep all follow-up visits as told by your health care provider. This is important. If You Have Diabetes:   Make sure you know the symptoms of hypoglycemia.  Always have a rapid-acting carbohydrate snack with you to treat low blood sugar.  Follow your diabetes management plan, as told by your health care provider. Make sure you: ? Take your medicines as directed. ? Follow your exercise plan. ? Follow your meal plan. Eat on time, and do not skip meals. ? Check your blood glucose as often as directed. Make sure to check  your blood glucose before and after exercise. If you exercise longer or in a different way than usual, check your blood glucose more often. ? Follow your sick day plan whenever you cannot eat or drink normally. Make this plan in advance with your health care provider.  Share your diabetes management plan with people in your workplace, school, and household.  Check your urine for ketones when you are ill and as told by your health care provider.  Carry a medical alert card or wear medical alert jewelry. If You Have Reactive Hypoglycemia or Low Blood Sugar From Other Causes:  Monitor your blood glucose as told by your health care provider.  Follow instructions from your health care provider about eating or drinking restrictions. Contact a health care provider if:  You have problems keeping your blood glucose in your target range.  You have frequent episodes of hypoglycemia. Get help right away if:  You continue to have hypoglycemia symptoms after eating or drinking something containing glucose.  Your blood glucose is at or below 54 mg/dL (3 mmol/L).  You have a seizure.  You faint. These symptoms may represent a serious problem that is an emergency. Do not wait to see if the symptoms will go away. Get medical help right away. Call your local emergency services (911 in the [place] Do not drive yourself to the hospital. This information is not intended to replace advice given to you by your health care provider. Make sure you discuss any questions you have with your health care provider. Document Released: 03/23/2005 Document Revised: 09/04/2015 Document Reviewed: 04/26/2015 Elsevier Interactive Patient Education  [employer].

## 2018-03-22 LAB — BASIC METABOLIC PANEL WITH GFR
BUN: 19 mg/dL (ref 7–25)
CO2: 26 mmol/L (ref 20–32)
CREATININE: 0.76 mg/dL (ref 0.50–0.99)
Calcium: 9.6 mg/dL (ref 8.6–10.4)
Chloride: 102 mmol/L (ref 98–110)
GFR, EST NON AFRICAN AMERICAN: 85 mL/min/{1.73_m2} (ref >=60)
GFR, Est African American: 98 mL/min/{1.73_m2} (ref >=60)
Glucose, Bld: 95 mg/dL (ref 65–99)
POTASSIUM: 3.9 mmol/L (ref 3.5–5.3)
SODIUM: 138 mmol/L (ref 135–146)

## 2018-03-24 LAB — FRUCTOSAMINE: Fructosamine: 332 umol/L — ABNORMAL HIGH (ref 205–285)

## 2018-04-07 ENCOUNTER — Other Ambulatory Visit: Payer: Self-pay

## 2018-04-07 DIAGNOSIS — Z794 Long term (current) use of insulin: Principal | ICD-10-CM

## 2018-04-07 DIAGNOSIS — E1165 Type 2 diabetes mellitus with hyperglycemia: Secondary | ICD-10-CM

## 2018-04-07 MED ORDER — INSULIN PEN NEEDLE 31G X 8 MM MISC: 1.0000 | Freq: Three times a day (TID) | 11 refills | Status: DC

## 2018-04-07 NOTE — Telephone Encounter (Signed)
Prescription request for insulin needles

## 2018-04-11 ENCOUNTER — Encounter: Payer: Self-pay | Admitting: Physician Assistant

## 2018-04-14 ENCOUNTER — Other Ambulatory Visit: Payer: Self-pay | Admitting: Adult Health

## 2018-04-14 DIAGNOSIS — E119 Type 2 diabetes mellitus without complications: Secondary | ICD-10-CM

## 2018-04-20 ENCOUNTER — Other Ambulatory Visit: Payer: Self-pay | Admitting: Internal Medicine

## 2018-04-20 DIAGNOSIS — F330 Major depressive disorder, recurrent, mild: Secondary | ICD-10-CM

## 2018-05-09 ENCOUNTER — Encounter (INDEPENDENT_AMBULATORY_CARE_PROVIDER_SITE_OTHER): Payer: Self-pay

## 2018-05-13 ENCOUNTER — Encounter: Payer: Self-pay | Admitting: *Deleted

## 2018-05-24 ENCOUNTER — Other Ambulatory Visit: Payer: Self-pay

## 2018-05-24 DIAGNOSIS — F988 Other specified behavioral and emotional disorders with onset usually occurring in childhood and adolescence: Secondary | ICD-10-CM

## 2018-05-24 MED ORDER — LISDEXAMFETAMINE DIMESYLATE 30 MG PO CAPS: 30.0000 mg | ORAL_CAPSULE | ORAL | 0 refills | Status: DC

## 2018-05-24 NOTE — Telephone Encounter (Signed)
Vyvanse refill request. PMP checked, last filled on 03/07/18, #90, 90 day supply. In que for your review with a DNF date until 05/30/18.

## 2018-05-31 ENCOUNTER — Other Ambulatory Visit: Payer: Self-pay | Admitting: Adult Health

## 2018-06-04 ENCOUNTER — Encounter: Payer: Self-pay | Admitting: Internal Medicine

## 2018-06-07 ENCOUNTER — Other Ambulatory Visit: Payer: Self-pay | Admitting: Adult Health

## 2018-06-07 ENCOUNTER — Other Ambulatory Visit: Payer: Self-pay

## 2018-06-07 DIAGNOSIS — Z794 Long term (current) use of insulin: Principal | ICD-10-CM

## 2018-06-07 DIAGNOSIS — E1165 Type 2 diabetes mellitus with hyperglycemia: Secondary | ICD-10-CM

## 2018-06-07 MED ORDER — "INSULIN SYRINGE-NEEDLE U-100 31G X 5/16"" 1 ML MISC": 2 refills | Status: DC

## 2018-06-07 MED ORDER — TIZANIDINE HCL 2 MG PO TABS: 2.0000 mg | ORAL_TABLET | Freq: Four times a day (QID) | ORAL | 0 refills | Status: DC | PRN

## 2018-06-09 NOTE — Progress Notes (Signed)
Complete Physical  Assessment and Plan: Essential hypertension - continue medications, DASH diet, exercise and monitor at home. Call if greater than 130/80.  -     CBC with Differential/Platelet -     BASIC METABOLIC PANEL WITH GFR -     Hepatic function panel -     TSH -     Urinalysis, Routine w reflex microscopic -     Microalbumin / creatinine urine ratio -     EKG 12-Lead  Lynch syndrome -     Continue follow up GYN/GI  Diabetes mellitus with hyperlipidemia ([place]) Discussed general issues about diabetes pathophysiology and management., Educational material distributed., Suggested low cholesterol diet., Encouraged aerobic exercise., Discussed foot care., Reminded to get yearly retinal exam. If not better need to discuss insulin - add on Rybelsus to insulin, try to cut back on insulin and weight -     Hemoglobin A1c  Hyperlipidemia, unspecified hyperlipidemia type -continue medications, check lipids, decrease fatty foods, increase activity.  -     Lipid panel  Medication management -     Magnesium  Vitamin D deficiency Continue supplement  Varicose veins of both lower extremities, unspecified whether complicated - weight loss discussed, continue compression stockings and elevation  Attention deficit disorder, unspecified hyperactivity presence vyvanse refilled  BMI 31.0-31.9,adult  Overweight  - long discussion about weight loss, diet, and exercise -recommended diet heavy in fruits and veggies and low in animal meats, cheeses, and dairy products  Class 1 obesity due to excess calories with serious comorbidity and body mass index (BMI) of 31.0 to 31.9 in adult - follow up 3 months for progress monitoring - increase veggies, decrease carbs - long discussion about weight loss, diet, and exercise  Hyperlipidemia associated with type 2 diabetes mellitus (HCC) -     Lipid panel  Right flank pain -     Urine Culture -     DG Abd 1 View; Future ? Shingles versus stone  versus back - gabapentin, get KUB and urine to rule out stone The patient was advised to call immediately if she has any concerning symptoms in the interval. The patient voices understanding of current treatment options and is in agreement with the current care plan.The patient knows to call the clinic with any problems, questions or concerns or go to the ER if any further progression of symptoms.    Discussed med's effects and SE's. Screening labs and tests as requested with regular follow-up as recommended. Over 40 minutes of exam, counseling, chart review, and complex, high level critical decision making was performed this visit.   Future Appointments  Date Time Provider [place]  09/16/2018 10:15 AM Vicie Mutters, PA-C [facility] None  06/19/2019  2:00 PM Vicie Mutters, PA-C [facility] None    HPI  63 y.o. female  presents for a complete physical.  She states about 2 weeks ago she "did something to her back." Given muscle relaxer that did not help. Has tried heat, ice, Aspercreme, 1 hydrocodone, ibuprofen 800mg  2 x a day. No injury. Feels right sided back pain, not worse with movement, feels like "a fist pushing into her right side". She has a small rash and states her skin itches.  She has not had a shingles vaccine.  Never had a kidney stone, no nausea, no fever, chills.   Her blood pressure has been controlled at home, today their BP is BP: 126/80 She does workout. She denies chest pain, shortness of breath, dizziness.  She is on  cholesterol medication, STATIN crestor 20mg  1/2 3 days a week and denies myalgias. Her cholesterol is not at goal. The cholesterol last visit was:   Lab Results  Component Value Date   CHOL 189 03/02/2018   HDL 55 03/02/2018   LDLCALC 97 03/02/2018   TRIG 241 (H) 03/02/2018   CHOLHDL 3.4 03/02/2018   She has been working on diet and exercise for diabetes without complications, she is on bASA, she is on ACE/ARB, she checks her sugars with  livongo, checking 1-2 a day, average 7 day is 202, high is 325 and low is 148,  No low sugars. She is on 15 in the AM and 10 at night.  and denies paresthesia of the feet, polydipsia, polyuria and visual disturbances. Last A1C in the office was:  Lab Results  Component Value Date   HGBA1C 13.0 (H) 03/02/2018   Patient is on Vitamin D supplement.   Lab Results  Component Value Date   VD25OH 46 03/02/2018     She has anxiety/depression, is on xanax PRN and lexapro She has ADD and is on Vyvanse.  Patient has history of lynch syndrome, follows with Dr. Hilarie Fredrickson, had partial prioctectomy with Dr. Johney Maine on 07/19/2014. They have also advised increase risk of other cancers including endometrial and ovarian cancer, she has not yes had a TAH due to recent cancer diagnosis in her husband.  BMI is Body mass index is 28.97 kg/m., she is working on diet and exercise. Wt Readings from Last 3 Encounters:  06/13/18 168 lb 12.8 oz (76.6 kg)  03/21/18 164 lb (74.4 kg)  03/02/18 157 lb 12.8 oz (71.6 kg)      Current Medications:  Current Outpatient Medications on File Prior to Visit  Medication Sig Dispense Refill  . acetaminophen (TYLENOL) 500 MG tablet Take 1,000 mg by mouth daily as needed for moderate pain or headache.    . albuterol (PROVENTIL HFA;VENTOLIN HFA) 108 (90 Base) MCG/ACT inhaler  Use 1 to 2 Inhalations 15 minutes apart every 4 hours to Rescue Asthma (Patient taking differently: as needed. ) 48 g 3  . ALPRAZolam (XANAX) 0.5 MG tablet TAKE 1 TABLET (0.5 MG TOTAL) BY MOUTH 2 (TWO) TIMES DAILY AS NEEDED. 60 tablet 0  . amitriptyline (ELAVIL) 50 MG tablet TAKE 1/2-1 TABLET AT NIGHT FOR SLEEP AS NEEDED 90 tablet 0  . aspirin 81 MG tablet Take 81 mg by mouth daily.    . CHOLECALCIFEROL PO Take 5,000 Units by mouth daily.    Marland Kitchen escitalopram (LEXAPRO) 20 MG tablet TAKE 1 TABLET BY MOUTH EVERY DAY 90 tablet 0  . fluticasone (FLONASE) 50 MCG/ACT nasal spray Place 2 sprays into both nostrils daily. 48  g 1  . ibuprofen (ADVIL,MOTRIN) 800 MG tablet Take 1 tablet (800 mg total) by mouth every 8 (eight) hours as needed. 90 tablet 1  . insulin NPH-regular Human (NOVOLIN 70/30) (70-30) 100 UNIT/ML injection Inject 25 units in the am and 20 units at dinner 30 mL 1  . Insulin Syringe-Needle U-100 (BD INSULIN SYRINGE U/F) 31G X 5/16" 1 ML MISC Use twice a day 100 each 2  . levocetirizine (XYZAL) 5 MG tablet TAKE 1 TABLET BY MOUTH EVERY DAY 90 tablet 1  . lisdexamfetamine (VYVANSE) 30 MG capsule Take 1 capsule (30 mg total) by mouth every morning. 90 capsule 0  . losartan (COZAAR) 100 MG tablet TAKE 1/2-1 TABLET DAILY FOR BLOOD PRESSURE AND KIDNEY PROTECTION FROM DM 90 tablet 1  . metFORMIN (GLUCOPHAGE-XR) 500  MG 24 hr tablet Take 2 tablets 2 x /day with meals for Diabetes 360 tablet 1  . olopatadine (PATANOL) 0.1 % ophthalmic solution PLACE 1 DROP INTO BOTH EYES 2 (TWO) TIMES DAILY. 5 mL 5  . promethazine-dextromethorphan (PROMETHAZINE-DM) 6.25-15 MG/5ML syrup Take 5 mLs by mouth 4 (four) times daily as needed for cough. 240 mL 1  . rosuvastatin (CRESTOR) 20 MG tablet TAKE 1 TABLET (20 MG TOTAL) BY MOUTH EVERY OTHER DAY. 90 tablet 1  . tiZANidine (ZANAFLEX) 2 MG tablet Take 1 tablet (2 mg total) by mouth every 6 (six) hours as needed for muscle spasms. 60 tablet 0  . Dulaglutide (TRULICITY) 4.85 IO/2.7OJ SOPN Inject 1 pen into the skin once a week. (Patient not taking: Reported on 06/13/2018) 4 pen 0   Current Facility-Administered Medications on File Prior to Visit  Medication Dose Route Frequency Provider Last Rate Last Dose  . 0.9 %  sodium chloride infusion  500 mL Intravenous Once Pyrtle, Lajuan Lines, MD       Health Maintenance:   Immunization History  Administered Date(s) Administered  . Influenza Inj Mdck Quad With Preservative 01/22/2017, 12/30/2017  . Influenza Split 03/08/2014, 01/17/2015  . Influenza, Seasonal, Injecte, Preservative Fre 01/08/2016  . PPD Test 12/28/2013  . Pneumococcal  Polysaccharide-23 04/22/2015  . Td 10/21/2005  . Tdap 08/03/2013   Tetanus: 2015 Pneumovax: 2017 Prevnar 13:  Age 45 Flu vaccine: 2019 Zostavax: declines  No LMP recorded (lmp unknown). Patient is postmenopausal. Pap: 09/2017 MGM: 06/2016 DEXA: Colonoscopy: 05/2017 DUE this year EGD: 05/2017 Stress test 2007 CXR 2009 Eye exam 07/23/2017  Patient Care Team: Unk Pinto, MD as PCP - General (Internal Medicine) Richmond Campbell, MD as Consulting Physician (Gastroenterology) Jannette Spanner, MD as Referring Physician (Dermatology) Rachel Moulds, DO (Internal Medicine) Garald Balding, MD as Consulting Physician (Orthopedic Surgery) Rutherford Guys, MD as Consulting Physician (Ophthalmology) Stanford Breed Denice Bors, MD as Consulting Physician (Cardiology) Michael Boston, MD as Consulting Physician (General Surgery) Pyrtle, Lajuan Lines, MD as Consulting Physician (Gastroenterology)  Medical History:  Past Medical History:  Diagnosis Date  . ADD (attention deficit disorder)   . Anxiety   . Asthma    with severe allergies pt does use albuterol inhaler when needed  . Depression   . Diabetes mellitus without complication ([place]) 5009   TYPE 2   . GERD (gastroesophageal reflux disease)    hx of had Nissen Fundoplication 3818 has not had any issues since  . History of hiatal hernia   . Hyperlipidemia 2004  . Hypertension   . IBS (irritable bowel syndrome)   . Lynch syndrome   . Tubular adenoma of colon    Allergies Allergies  Allergen Reactions  . Tape Rash  . Ace Inhibitors Cough  . Metformin And Related     myalgia  . Morphine And Related Itching  . Penicillins     Pt states that in 3rd grade she was given an injection in the buttock and reacted with a large hive in that area Pt can take oral penicillin with no reaction    . Wellbutrin [Bupropion]     Felt funny    SURGICAL HISTORY She  has a past surgical history that includes Hernia repair; Cholecystectomy; Tubal  ligation; Cosmetic surgery; Rotator cuff repair (Right, 2012); Eye surgery (2006); Nissen fundoplication (2993); Carpal tunnel release (1998, 2001); Nasal sinus surgery; colonscopy ; Upper gi endoscopy; and Partial proctectomy by tem (N/A, 07/19/2014).   FAMILY HISTORY Her family history includes Breast  cancer in her mother and sister; Cancer in her mother and paternal grandmother; Colon cancer (age of onset: 68) in her brother; Colon cancer (age of onset: 34) in her paternal grandfather; Colon cancer (age of onset: 16) in her father; Diabetes in her mother; Hyperlipidemia in her father and mother; Hypertension in her father and mother; Prostate cancer in her father; Skin cancer in her father.   SOCIAL HISTORY She  reports that she has never smoked. She has never used smokeless tobacco. She reports that she does not drink alcohol or use drugs.  Review of Systems: Review of Systems  Constitutional: Negative.   HENT: Negative.   Eyes: Negative.   Respiratory: Negative.   Cardiovascular: Negative.   Gastrointestinal: Negative.   Genitourinary: Positive for flank pain.  Skin: Negative.   Neurological: Negative.   Endo/Heme/Allergies: Negative.   Psychiatric/Behavioral: Negative.     Physical Exam: Estimated body mass index is 28.97 kg/m as calculated from the following:   Height as of this encounter: 5\' 4"  (1.626 m).   Weight as of this encounter: 168 lb 12.8 oz (76.6 kg). BP 126/80   Pulse 68   Temp 97.9 F (36.6 C)   Ht 5\' 4"  (1.626 m)   Wt 168 lb 12.8 oz (76.6 kg)   LMP  (LMP Unknown)   SpO2 95%   BMI 28.97 kg/m  General Appearance: Well nourished, in no apparent distress.  Eyes: PERRLA, EOMs, conjunctiva no swelling or erythema, normal fundi and vessels.  Sinuses: No Frontal/maxillary tenderness  ENT/Mouth: Ext aud canals clear, normal light reflex with TMs without erythema, bulging.  No erythema, swelling, or exudate on post pharynx. Tonsils not swollen or erythematous.  Hearing normal.  Neck: Supple, thyroid normal. No bruits  Respiratory: Respiratory effort normal, BS equal bilaterally without rales, rhonchi, wheezing or stridor.  Cardio: RRR without murmurs, rubs or gallops. Brisk peripheral pulses without edema.  Chest: symmetric, with normal excursions and percussion.  Breasts: Symmetric, without lumps, nipple discharge, retractions.  Abdomen: Soft, + epigastric tenderness, no guarding, rebound, hernias, masses, or organomegaly.  Lymphatics: Non tender without lymphadenopathy.  Genitourinary: defer Musculoskeletal: Full ROM all peripheral extremities,5/5 strength, and normal gait.  Skin: very mild erythematous rash along right flank. Warm, dry without rashes, lesions, ecchymosis. Neuro: Cranial nerves intact, reflexes equal bilaterally. Normal muscle tone, no cerebellar symptoms. Sensation intact.  Psych: Awake and oriented X 3, normal affect, Insight and Judgment appropriate.   EKG: WNL AORTA SCAN: defer  Vicie Mutters 2:18 PM [facility]

## 2018-06-13 ENCOUNTER — Ambulatory Visit (INDEPENDENT_AMBULATORY_CARE_PROVIDER_SITE_OTHER): Payer: 59 | Admitting: Physician Assistant

## 2018-06-13 ENCOUNTER — Ambulatory Visit
Admission: RE | Admit: 2018-06-13 | Discharge: 2018-06-13 | Disposition: A | Discharge status: Home / Self Care | Payer: 59 | Source: Ambulatory Visit | Attending: Physician Assistant | Admitting: Physician Assistant

## 2018-06-13 ENCOUNTER — Encounter: Payer: Self-pay | Admitting: Physician Assistant

## 2018-06-13 VITALS — BP 126/80 | HR 68 | Temp 97.9°F | Ht 64.0 in | Wt 168.8 lb

## 2018-06-13 DIAGNOSIS — R109 Unspecified abdominal pain: Secondary | ICD-10-CM

## 2018-06-13 DIAGNOSIS — Z79899 Other long term (current) drug therapy: Secondary | ICD-10-CM

## 2018-06-13 DIAGNOSIS — E1169 Type 2 diabetes mellitus with other specified complication: Secondary | ICD-10-CM

## 2018-06-13 DIAGNOSIS — I8393 Asymptomatic varicose veins of bilateral lower extremities: Secondary | ICD-10-CM

## 2018-06-13 DIAGNOSIS — I10 Essential (primary) hypertension: Secondary | ICD-10-CM

## 2018-06-13 DIAGNOSIS — E785 Hyperlipidemia, unspecified: Secondary | ICD-10-CM

## 2018-06-13 DIAGNOSIS — F988 Other specified behavioral and emotional disorders with onset usually occurring in childhood and adolescence: Secondary | ICD-10-CM

## 2018-06-13 DIAGNOSIS — Z136 Encounter for screening for cardiovascular disorders: Secondary | ICD-10-CM

## 2018-06-13 DIAGNOSIS — E559 Vitamin D deficiency, unspecified: Secondary | ICD-10-CM

## 2018-06-13 DIAGNOSIS — Z13220 Encounter for screening for lipoid disorders: Secondary | ICD-10-CM | POA: Diagnosis not present

## 2018-06-13 DIAGNOSIS — Z1329 Encounter for screening for other suspected endocrine disorder: Secondary | ICD-10-CM | POA: Diagnosis not present

## 2018-06-13 DIAGNOSIS — R350 Frequency of micturition: Secondary | ICD-10-CM | POA: Diagnosis not present

## 2018-06-13 DIAGNOSIS — D128 Benign neoplasm of rectum: Secondary | ICD-10-CM

## 2018-06-13 DIAGNOSIS — Z794 Long term (current) use of insulin: Secondary | ICD-10-CM

## 2018-06-13 DIAGNOSIS — E1165 Type 2 diabetes mellitus with hyperglycemia: Secondary | ICD-10-CM

## 2018-06-13 DIAGNOSIS — E663 Overweight: Secondary | ICD-10-CM

## 2018-06-13 DIAGNOSIS — Z0000 Encounter for general adult medical examination without abnormal findings: Secondary | ICD-10-CM | POA: Diagnosis not present

## 2018-06-13 DIAGNOSIS — Z1389 Encounter for screening for other disorder: Secondary | ICD-10-CM

## 2018-06-13 DIAGNOSIS — Z131 Encounter for screening for diabetes mellitus: Secondary | ICD-10-CM | POA: Diagnosis not present

## 2018-06-13 DIAGNOSIS — Z1509 Genetic susceptibility to other malignant neoplasm: Secondary | ICD-10-CM

## 2018-06-13 MED ORDER — SEMAGLUTIDE 14 MG PO TABS: 14.0000 mg | ORAL_TABLET | Freq: Every day | ORAL | 3 refills | Status: DC

## 2018-06-13 MED ORDER — GABAPENTIN 300 MG PO CAPS: 300.0000 mg | ORAL_CAPSULE | Freq: Three times a day (TID) | ORAL | 2 refills | Status: DC

## 2018-06-13 MED ORDER — SEMAGLUTIDE 7 MG PO TABS: 7.0000 mg | ORAL_TABLET | ORAL | 0 refills | Status: DC

## 2018-06-13 NOTE — Patient Instructions (Addendum)
INFORMATION ABOUT YOUR XRAY  Can walk into [address] for an Insurance account manager. They will have the order and take you back. You do not any paper work, I should get the result back today or tomorrow. This order is good for a year.   I think your right side is either shingles or a kidney stone/issue Can take the gabapentin 300mg  at night FOR NERVE PAIN/SHINGLES PAIN  It can make you sleepy so we suggest trying it at night first and please plan to not drive or do anything strenuous.  Also please do not take this medication with alcohol.   Start out 1 pill at night before bed, can increase to 2 pills at night before bed. Please call the office if you have any side effects.   Can take 3 pills a day total however you would like  Some examples: - 1 breakfast, lunch, bedtime. - 1 at breakfast, 2 at bed time  Common side effects are sleepiness, concentration problems, dizziness, swelling.  Do not stop abruptly unless you have a reaction to it.    We are starting you on a mix of insulin- it is called 70/30 mixed insulin The 30 of insulin is fast acting and MUST be taken with a meal The 70 of the insulin is an intermediate dose and will last for longer to cover you during the day.   Please check your sugars at home: check it in in the morning before food Check it before your dinner And for a while check it before bed while we are adjusting your medications  IT IS VERY IMPORTANT TO BRING THIS LOG IN to your office visits.   We will likely need to increase this but we will do this slowly because a low sugar is much more dangerous than a high sugar.    Your brain needs 2 things, oxygen and sugar, so lets make sure it gets both.  Please remember only take the insulin WITH food   if you are sick or unable to eat DO NOT take your insulin   If at any time you have a question or concern, call the office or message [place] in [place].   START THE RYBELSUS 3 MG FOR 1 MONTH IN THE MORNING BEFORE FOOD ON  AN EMPTY STOMACH WITH WATER ONLY DO THE 7 MG ONCE A DAY FOR 1 MONTHIN THE MORNING BEFORE FOOD ON AN EMPTY STOMACH WITH WATER ONLY THEN GET THE 14 MG ONCE A DAY IN THE MORNING BEFORE FOOD ON AN EMPTY STOMACH WITH WATER ONLY  Largest symptoms is nausea if you are going to stop it   Your ears and sinuses are connected by the eustachian tube. When your sinuses are inflamed, this can close off the tube and cause fluid to collect in your middle ear. This can then cause dizziness, popping, clicking, ringing, and echoing in your ears. This is often NOT an infection and does NOT require antibiotics, it is caused by inflammation so the treatments help the inflammation. This can take a long time to get better so please be patient.  Here are things you can do to help with this: - Try the Flonase or Nasonex. Remember to spray each nostril twice towards the outer part of your eye.  Do not sniff but instead pinch your nose and tilt your head back to help the medicine get into your sinuses.  The best time to do this is at bedtime.Stop if you get blurred vision or nose  bleeds.  -While drinking fluids, pinch and hold nose close and swallow, to help open eustachian tubes to drain fluid behind ear drums. -Please pick one of the over the counter allergy medications below and take it once daily for allergies.  It will also help with fluid behind ear drums. Claritin or loratadine cheapest but likely the weakest  Zyrtec or certizine at night because it can make you sleepy The strongest is allegra or fexafinadine  Cheapest at [facility], [employer], [employer] -can use decongestant over the counter, please do not use if you have high blood pressure or certain heart conditions.   if worsening HA, changes vision/speech, imbalance, weakness go to the ER   Diabetes or even increased sugars put you at 300% increased risk of heart attack and stroke.  ALSO BEING DIABETIC YOU MAY NOT HAVE ANY PAIN WITH A HEART ATTACK.  Even worse of a  chance of no pain if you are a woman.  It is very unlikely that you will have any pain with a heart attack. Likely your symptoms will be very subtle, even for very severe disease.  Your symptoms for a heart attack will likely occur when you exert your self or exercise and include: Shortness of breath Sweating Nausea Dizziness Fast or irregular heart beats Fatigue   It makes me feel better if my diabetics get their heart rate up with exercise once or twice a week and pay close attention to your body. If there is ANY change in your exercise capacity or if you have symptoms above, please STOP and call 911 or call to come to the office.   PLEASE REMEMBER:  Diabetes is preventable! Up to 93 percent of complications and morbidities among individuals with type 2 diabetes can be prevented, delayed, or effectively treated and minimized with regular visits to a health professional, appropriate monitoring and medication, and a healthy diet and lifestyle.   Here is some information to help you keep your heart healthy: Move it! - Aim for 30 mins of activity every day. Take it slowly at first. Talk to [place] before starting any new exercise program.   Lose it.  -Body Mass Index (BMI) can indicate if you need to lose weight. A healthy range is 18.5-24.9. For a BMI calculator, go to [employer].com  Waist Management -Excess abdominal fat is a risk factor for heart disease, diabetes, asthma, stroke and more. Ideal waist circumference is less than 35" for women and less than 40" for men.   Eat Right -focus on fruits, vegetables, whole grains, and meals you make yourself. Avoid foods with trans fat and high sugar/sodium content.   Snooze or Snore? - Loud snoring can be a sign of sleep apnea, a significant risk factor for high blood pressure, heart attach, stroke, and heart arrhythmias.  Kick the habit -Quit Smoking! Avoid second hand smoke. A single cigarette raises your blood pressure for 20 mins and  increases the risk of heart attack and stroke for the next 24 hours.   Are Aspirin and Supplements right for you? -Add ENTERIC COATED low dose 81 mg Aspirin daily OR can do every other day if you have easy bruising to protect your heart and head. As well as to reduce risk of Colon Cancer by 20 %, Skin Cancer by 26 % , Melanoma by 46% and Pancreatic cancer by 60%  Say "No to Stress -There may be little you can do about problems that cause stress. However, techniques such as long walks, meditation, and exercise  can help you manage it.   Start Now! - Make changes one at a time and set reasonable goals to increase your likelihood of success.         Stroke Prevention Some medical conditions and behaviors are associated with a higher chance of having a stroke. You can help prevent a stroke by making nutrition, lifestyle, and other changes, including managing any medical conditions you may have. What nutrition changes can be made?  Eat healthy foods. You can do this by: ? Choosing foods high in fiber, such as fresh fruits and vegetables and whole grains. ? Eating at least 5 or more servings of fruits and vegetables a day. Try to fill half of your plate at each meal with fruits and vegetables. ? Choosing lean protein foods, such as lean cuts of meat, poultry without skin, fish, tofu, beans, and nuts. ? Eating low-fat dairy products. ? Avoiding foods that are high in salt (sodium). This can help lower blood pressure. ? Avoiding foods that have saturated fat, trans fat, and cholesterol. This can help prevent high cholesterol. ? Avoiding processed and premade foods.  Follow your health care provider's specific guidelines for losing weight, controlling high blood pressure (hypertension), lowering high cholesterol, and managing diabetes. These may include: ? Reducing your daily calorie intake. ? Limiting your daily sodium intake to 1,500 milligrams (mg). ? Using only healthy fats for cooking,  such as olive oil, canola oil, or sunflower oil. ? Counting your daily carbohydrate intake. What lifestyle changes can be made?  Maintain a healthy weight. Talk to your health care provider about your ideal weight.  Get at least 30 minutes of moderate physical activity at least 5 days a week. Moderate activity includes brisk walking, biking, and swimming.  Do not use any products that contain nicotine or tobacco, such as cigarettes and e-cigarettes. If you need help quitting, ask your health care provider. It may also be helpful to avoid exposure to secondhand smoke.  Limit alcohol intake to no more than 1 drink a day for nonpregnant women and 2 drinks a day for men. One drink equals 12 oz of beer, 5 oz of wine, or 1 oz of hard liquor.  Stop any illegal drug use.  Avoid taking birth control pills. Talk to your health care provider about the risks of taking birth control pills if: ? You are over 10 years old. ? You smoke. ? You get migraines. ? You have ever had a blood clot. What other changes can be made?  Manage your cholesterol levels. ? Eating a healthy diet is important for preventing high cholesterol. If cholesterol cannot be managed through diet alone, you may also need to take medicines. ? Take any prescribed medicines to control your cholesterol as told by your health care provider.  Manage your diabetes. ? Eating a healthy diet and exercising regularly are important parts of managing your blood sugar. If your blood sugar cannot be managed through diet and exercise, you may need to take medicines. ? Take any prescribed medicines to control your diabetes as told by your health care provider.  Control your hypertension. ? To reduce your risk of stroke, try to keep your blood pressure below 130/80. ? Eating a healthy diet and exercising regularly are an important part of controlling your blood pressure. If your blood pressure cannot be managed through diet and exercise, you may  need to take medicines. ? Take any prescribed medicines to control hypertension as told by your health care  provider. ? Ask your health care provider if you should monitor your blood pressure at home. ? Have your blood pressure checked every year, even if your blood pressure is normal. Blood pressure increases with age and some medical conditions.  Get evaluated for sleep disorders (sleep apnea). Talk to your health care provider about getting a sleep evaluation if you snore a lot or have excessive sleepiness.  Take over-the-counter and prescription medicines only as told by your health care provider. Aspirin or blood thinners (antiplatelets or anticoagulants) may be recommended to reduce your risk of forming blood clots that can lead to stroke.  Make sure that any other medical conditions you have, such as atrial fibrillation or atherosclerosis, are managed. What are the warning signs of a stroke? The warning signs of a stroke can be easily remembered as BEFAST.  B is for balance. Signs include: ? Dizziness. ? Loss of balance or coordination. ? Sudden trouble walking.  E is for eyes. Signs include: ? A sudden change in vision. ? Trouble seeing.  F is for face. Signs include: ? Sudden weakness or numbness of the face. ? The face or eyelid drooping to one side.  A is for arms. Signs include: ? Sudden weakness or numbness of the arm, usually on one side of the body.  S is for speech. Signs include: ? Trouble speaking (aphasia). ? Trouble understanding.  T is for time. ? These symptoms may represent a serious problem that is an emergency. Do not wait to see if the symptoms will go away. Get medical help right away. Call your local emergency services (911 in the [place] Do not drive yourself to the hospital.  Other signs of stroke may include: ? A sudden, severe headache with no known cause. ? Nausea or vomiting. ? Seizure.  Where to find more information: For more information,  visit:  American Stroke Association: www.strokeassociation.org  National Stroke Association: www.stroke.org  Summary  You can prevent a stroke by eating healthy, exercising, not smoking, limiting alcohol intake, and managing any medical conditions you may have.  Do not use any products that contain nicotine or tobacco, such as cigarettes and e-cigarettes. If you need help quitting, ask your health care provider. It may also be helpful to avoid exposure to secondhand smoke.  Remember BEFAST for warning signs of stroke. Get help right away if you or a loved one has any of these signs. This information is not intended to replace advice given to you by your health care provider. Make sure you discuss any questions you have with your health care provider. Document Released: 04/30/2004 Document Revised: 04/28/2016 Document Reviewed: 04/28/2016 Elsevier Interactive Patient Education  [employer].     When it comes to diets, agreement about the perfect plan isn't easy to find, even among the experts. Experts at the [employer] developed an idea known as the Healthy Eating Plate. Just imagine a plate divided into logical, healthy portions.  The emphasis is on diet quality:  Load up on vegetables and fruits - one-half of your plate: Aim for color and variety, and remember that potatoes don't count.  Go for whole grains - one-quarter of your plate: Whole wheat, barley, wheat berries, quinoa, oats, [place] rice, and foods made with them. If you want pasta, go with whole wheat pasta.  Protein power - one-quarter of your plate: Fish, chicken, beans, and nuts are all healthy, versatile protein sources. Limit red meat.  The diet, however, does go  beyond the plate, offering a few other suggestions.  Use healthy plant oils, such as olive, canola, soy, corn, sunflower and peanut. Check the labels, and avoid partially hydrogenated oil, which have unhealthy trans fats.  If  you're thirsty, drink water. Coffee and tea are good in moderation, but skip sugary drinks and limit milk and dairy products to one or two daily servings.  The type of carbohydrate in the diet is more important than the amount. Some sources of carbohydrates, such as vegetables, fruits, whole grains, and beans-are healthier than others.  Finally, stay active.

## 2018-06-14 ENCOUNTER — Other Ambulatory Visit: Payer: Self-pay | Admitting: Adult Health

## 2018-06-14 LAB — CBC WITH DIFFERENTIAL/PLATELET
Absolute Monocytes: 512 cells/uL (ref 200–950)
BASOS ABS: 121 {cells}/uL (ref 0–200)
Basophils Relative: 1.3 %
Eosinophils Absolute: 177 cells/uL (ref 15–500)
Eosinophils Relative: 1.9 %
HCT: 45 % (ref 35.0–45.0)
Hemoglobin: 14.7 g/dL (ref 11.7–15.5)
Lymphs Abs: 2251 cells/uL (ref 850–3900)
MCH: 27.3 pg (ref 27.0–33.0)
MCHC: 32.7 g/dL (ref 32.0–36.0)
MCV: 83.5 fL (ref 80.0–100.0)
MPV: 11.4 fL (ref 7.5–12.5)
Monocytes Relative: 5.5 %
Neutro Abs: 6240 cells/uL (ref 1500–7800)
Neutrophils Relative %: 67.1 %
Platelets: 378 10*3/uL (ref 140–400)
RBC: 5.39 10*6/uL — ABNORMAL HIGH (ref 3.80–5.10)
RDW: 12.3 % (ref 11.0–15.0)
Total Lymphocyte: 24.2 %
WBC: 9.3 10*3/uL (ref 3.8–10.8)

## 2018-06-14 LAB — COMPLETE METABOLIC PANEL WITH GFR
AG Ratio: 1.4 (calc) (ref 1.0–2.5)
ALT: 12 U/L (ref 6–29)
AST: 13 U/L (ref 10–35)
Albumin: 4.2 g/dL (ref 3.6–5.1)
Alkaline phosphatase (APISO): 82 U/L (ref 37–153)
BUN: 14 mg/dL (ref 7–25)
CO2: 25 mmol/L (ref 20–32)
Calcium: 10.1 mg/dL (ref 8.6–10.4)
Chloride: 102 mmol/L (ref 98–110)
Creat: 0.76 mg/dL (ref 0.50–0.99)
GFR, Est African American: 97 mL/min/{1.73_m2} (ref >=60)
GFR, Est Non African American: 84 mL/min/{1.73_m2} (ref >=60)
Globulin: 3.1 g/dL (calc) (ref 1.9–3.7)
Glucose, Bld: 206 mg/dL — ABNORMAL HIGH (ref 65–99)
POTASSIUM: 4 mmol/L (ref 3.5–5.3)
Sodium: 138 mmol/L (ref 135–146)
Total Bilirubin: 0.5 mg/dL (ref 0.2–1.2)
Total Protein: 7.3 g/dL (ref 6.1–8.1)

## 2018-06-14 LAB — URINE CULTURE
MICRO NUMBER:: 294560
Result:: NO GROWTH
SPECIMEN QUALITY:: ADEQUATE

## 2018-06-14 LAB — LIPID PANEL
Cholesterol: 177 mg/dL (ref <200)
HDL: 60 mg/dL (ref >=50)
LDL Cholesterol (Calc): 85 mg/dL (calc)
Non-HDL Cholesterol (Calc): 117 mg/dL (calc) (ref <130)
Total CHOL/HDL Ratio: 3 (calc) (ref <5.0)
Triglycerides: 219 mg/dL — ABNORMAL HIGH (ref <150)

## 2018-06-14 LAB — HEMOGLOBIN A1C
EAG (MMOL/L): 13.2 (calc)
Hgb A1c MFr Bld: 9.9 % of total Hgb — ABNORMAL HIGH (ref <5.7)
Mean Plasma Glucose: 237 (calc)

## 2018-06-14 LAB — MICROALBUMIN / CREATININE URINE RATIO
Creatinine, Urine: 77 mg/dL (ref 20–275)
MICROALB/CREAT RATIO: 8 ug/mg{creat} (ref <30)
Microalb, Ur: 0.6 mg/dL

## 2018-06-14 LAB — URINALYSIS, ROUTINE W REFLEX MICROSCOPIC
Bacteria, UA: NONE SEEN /HPF
Bilirubin Urine: NEGATIVE
HYALINE CAST: NONE SEEN /LPF
Hgb urine dipstick: NEGATIVE
Ketones, ur: NEGATIVE
Nitrite: NEGATIVE
Protein, ur: NEGATIVE
RBC / HPF: NONE SEEN /HPF (ref 0–2)
Specific Gravity, Urine: 1.016 (ref 1.001–1.03)
Squamous Epithelial / HPF: NONE SEEN /HPF (ref <=5)
WBC, UA: NONE SEEN /HPF (ref 0–5)
pH: <=5 (ref 5.0–8.0)

## 2018-06-14 LAB — TSH: TSH: 3.23 mIU/L (ref 0.40–4.50)

## 2018-06-14 LAB — VITAMIN D 25 HYDROXY (VIT D DEFICIENCY, FRACTURES): Vit D, 25-Hydroxy: 52 ng/mL (ref 30–100)

## 2018-06-14 LAB — MAGNESIUM: MAGNESIUM: 1.6 mg/dL (ref 1.5–2.5)

## 2018-06-15 ENCOUNTER — Encounter: Payer: Self-pay | Admitting: Physician Assistant

## 2018-06-18 ENCOUNTER — Other Ambulatory Visit: Payer: Self-pay | Admitting: Internal Medicine

## 2018-06-18 ENCOUNTER — Other Ambulatory Visit: Payer: Self-pay | Admitting: Adult Health

## 2018-06-18 MED ORDER — INSULIN NPH ISOPHANE & REGULAR (70-30) 100 UNIT/ML ~~LOC~~ SUSP: SUBCUTANEOUS | 1 refills | Status: DC

## 2018-06-28 ENCOUNTER — Other Ambulatory Visit: Payer: Self-pay | Admitting: Adult Health

## 2018-07-01 ENCOUNTER — Other Ambulatory Visit: Payer: Self-pay | Admitting: Adult Health

## 2018-07-04 ENCOUNTER — Telehealth: Payer: Self-pay | Admitting: Physician Assistant

## 2018-07-04 DIAGNOSIS — F988 Other specified behavioral and emotional disorders with onset usually occurring in childhood and adolescence: Secondary | ICD-10-CM

## 2018-07-04 MED ORDER — LISDEXAMFETAMINE DIMESYLATE 30 MG PO CAPS: 30.0000 mg | ORAL_CAPSULE | ORAL | 0 refills | Status: DC

## 2018-07-04 NOTE — Telephone Encounter (Signed)
-----   Message from Elenor Quinones, [place] sent at 07/04/2018 11:15 AM EDT ----- Regarding: med refill Contact: [phone] PER PT/YELLOW NOTE:  Refill on VYVANSE Please & thank you!  Pharmacy: [facility]

## 2018-07-05 ENCOUNTER — Other Ambulatory Visit: Payer: Self-pay | Admitting: Physician Assistant

## 2018-07-05 DIAGNOSIS — R109 Unspecified abdominal pain: Secondary | ICD-10-CM

## 2018-07-08 ENCOUNTER — Other Ambulatory Visit: Payer: Self-pay | Admitting: Adult Health

## 2018-07-22 ENCOUNTER — Other Ambulatory Visit: Payer: Self-pay | Admitting: Internal Medicine

## 2018-07-22 DIAGNOSIS — F330 Major depressive disorder, recurrent, mild: Secondary | ICD-10-CM

## 2018-08-17 ENCOUNTER — Other Ambulatory Visit: Payer: Self-pay | Admitting: Internal Medicine

## 2018-08-17 DIAGNOSIS — Z79899 Other long term (current) drug therapy: Secondary | ICD-10-CM

## 2018-09-15 NOTE — Progress Notes (Signed)
THIS ENCOUNTER IS A VIRTUAL/TELEPHONE VISIT DUE TO COVID-19 - PATIENT WAS NOT SEEN IN THE OFFICE.  PATIENT HAS CONSENTED TO VIRTUAL VISIT / TELEMEDICINE VISIT  This provider placed a call to Sydney Dickson using telephone, her appointment was changed to a virtual office visit to reduce the risk of exposure to the COVID-19 virus and to help [employer] remain healthy and safe. The virtual visit will also provide continuity of care. She verbalizes understanding.   FOLLOW UP  Assessment and Plan:   Hypertension Well controlled with current medications  Monitor blood pressure at home; patient to call if consistently greater than 130/80 Continue DASH diet.   Reminder to go to the ER if any CP, SOB, nausea, dizziness, severe HA, changes vision/speech, left arm numbness and tingling and jaw pain.  Cholesterol Currently near goal; currently taking 10 mg rosuvastatin every other day, advised increase to 20 mg every other day Continue low cholesterol diet and exercise.  Check lipid panel.   Diabetes without complications Managed by endocrinology, encouraged her to schedule follow up Continue medication: increase to metformin XR 500 mg x 4 daily; start on ozempic, will monitor sugars and start decreasing her insulin Phone visit 1 month to see how doing and OV in 3 months Perform daily foot/skin check, notify office of any concerning changes.  Check A1C  Overweight Long discussion about weight loss, diet, and exercise Recommended diet heavy in fruits and veggies and low in animal meats, cheeses, and dairy products, appropriate calorie intake Discussed ideal weight for height Will follow up in 3 months  Vitamin D Def Below goal at last visit; taking 5000 IU daily  check Vit D level  Depression/anxiety Continue medications  Lifestyle discussed: diet/exerise, sleep hygiene, stress management, hydration  ADD Changed vyvanse to 30 mg with improved sleep Helps with focus, no AE's. The  patient was counseled on the addictive nature of the medication and was encouraged to take drug holidays when not needed.   Dry mouth/eyes Get sjogren antibody next OV Increase water Do flonase night, cut back on elavil Do sour sugar free candy  Continue diet and meds as discussed. Further disposition pending results of labs. Discussed med's effects and SE's.   Over 30 minutes of exam, counseling, chart review, and critical decision making was performed.   Future Appointments  Date Time Provider [place]  09/16/2018 10:15 AM Vicie Mutters, PA-C [facility] None  06/19/2019  2:00 PM Vicie Mutters, PA-C [facility] None    ----------------------------------------------------------------------------------------------------------------------  HPI 63 y.o. female  presents for 3 month follow up on hypertension, cholesterol, diabetes, weight and vitamin D deficiency.   She complains of dry eyes, dry mouth. She is on elavil nightly 50mg  xyzal, not on flonase at this time.     she currently uses xanax occasionally, ~2 tabs/week depending on stress.  She started a part time job this week at [employer] in [place].   BMI is Body mass index is 28.84 kg/m., she has been working on diet, not watching grandson at this time, just put in swimming pool.  Wt Readings from Last 3 Encounters:  09/16/18 168 lb (76.2 kg)  06/13/18 168 lb 12.8 oz (76.6 kg)  03/21/18 164 lb (74.4 kg)   Her blood pressure has been controlled at home, today their BP is BP: 130/83  She does not workout, but watches her 50 y/o grandchild and consequently very acitve She denies chest pain, shortness of breath, dizziness.   She is on cholesterol medication (  rosuvastatin 10 mg every other day) and denies myalgias. Her cholesterol is not at goal of less than 70 due to DM. The cholesterol last visit was:   Lab Results  Component Value Date   CHOL 177 06/13/2018   HDL 60 06/13/2018   LDLCALC 85 06/13/2018   TRIG 219  (H) 06/13/2018   CHOLHDL 3.0 06/13/2018    She has been working on diet and exercise for T2DM  CKD stage 1 on losartan 100mg  Hyperlipidemia on crestor 10 every other day She is on 70/30 insulin 15 units in the AM and 10 at night with dinner She is on the metformin ER 782 BID Was on trulicity, given rylbesus but has not started, would prefer a once a week shot.  has been referred to Dr. Rocco Pauls for management, though hasn't follow up since May 2019 Eye exam 07/23/2017- scheduled for 2 weeks denies foot ulcerations, increased appetite, nausea, paresthesia of the feet and visual disturbances. Meter: [place] Sugars: average 185 this week- takes before breakfast and before supper- high 243, low 114  Last A1C in the office was:  Lab Results  Component Value Date   HGBA1C 9.9 (H) 06/13/2018     Lab Results  Component Value Date   GFRNONAA 84 06/13/2018    Patient is not on Vitamin D supplement.   Lab Results  Component Value Date   VD25OH 52 06/13/2018       Current Medications:  Current Outpatient Medications on File Prior to Visit  Medication Sig  . acetaminophen (TYLENOL) 500 MG tablet Take 1,000 mg by mouth daily as needed for moderate pain or headache.  . albuterol (PROVENTIL HFA;VENTOLIN HFA) 108 (90 Base) MCG/ACT inhaler  Use 1 to 2 Inhalations 15 minutes apart every 4 hours to Rescue Asthma (Patient taking differently: as needed. )  . ALPRAZolam (XANAX) 0.5 MG tablet TAKE 1 TABLET (0.5 MG TOTAL) BY MOUTH 2 (TWO) TIMES DAILY AS NEEDED.  Marland Kitchen amitriptyline (ELAVIL) 50 MG tablet TAKE 1/2-1 TABLET AT NIGHT FOR SLEEP AS NEEDED  . aspirin 81 MG tablet Take 81 mg by mouth daily.  . CHOLECALCIFEROL PO Take 5,000 Units by mouth daily.  Marland Kitchen escitalopram (LEXAPRO) 20 MG tablet Take 1 tablet Daily for Mood  . fluticasone (FLONASE) 50 MCG/ACT nasal spray Place 2 sprays into both nostrils daily.  Marland Kitchen gabapentin (NEURONTIN) 300 MG capsule TAKE 1 CAPSULE BY MOUTH THREE TIMES A DAY  .  ibuprofen (ADVIL,MOTRIN) 800 MG tablet Take 1 tablet (800 mg total) by mouth every 8 (eight) hours as needed.  . insulin NPH-regular Human (NOVOLIN 70/30) (70-30) 100 UNIT/ML injection INJECT 25 UNITS IN THE AM AND 20 UNITS AT DINNER  . Insulin Syringe-Needle U-100 (BD INSULIN SYRINGE U/F) 31G X 5/16" 1 ML MISC Use twice a day  . levocetirizine (XYZAL) 5 MG tablet TAKE 1 TABLET BY MOUTH EVERY DAY  . lisdexamfetamine (VYVANSE) 30 MG capsule Take 1 capsule (30 mg total) by mouth every morning.  Marland Kitchen losartan (COZAAR) 100 MG tablet TAKE 1/2-1 TABLET DAILY FOR BLOOD PRESSURE AND KIDNEY PROTECTION FROM DM  . metFORMIN (GLUCOPHAGE-XR) 500 MG 24 hr tablet Take 2 tablets 2 x /day with meals for Diabetes  . olopatadine (PATANOL) 0.1 % ophthalmic solution PLACE 1 DROP INTO BOTH EYES 2 (TWO) TIMES DAILY.  Marland Kitchen promethazine-dextromethorphan (PROMETHAZINE-DM) 6.25-15 MG/5ML syrup Take 5 mLs by mouth 4 (four) times daily as needed for cough.  . rosuvastatin (CRESTOR) 20 MG tablet TAKE 1 TABLET (20 MG TOTAL) BY MOUTH  EVERY OTHER DAY.  Marland Kitchen tiZANidine (ZANAFLEX) 2 MG tablet TAKE 1 TABLET (2 MG TOTAL) BY MOUTH EVERY 6 (SIX) HOURS AS NEEDED FOR MUSCLE SPASMS.   No current facility-administered medications on file prior to visit.      Allergies:  Allergies  Allergen Reactions  . Tape Rash  . Ace Inhibitors Cough  . Metformin And Related     myalgia  . Morphine And Related Itching  . Penicillins     Pt states that in 3rd grade she was given an injection in the buttock and reacted with a large hive in that area Pt can take oral penicillin with no reaction    . Wellbutrin [Bupropion]     Felt funny     Medical History:  Past Medical History:  Diagnosis Date  . ADD (attention deficit disorder)   . Anxiety   . Asthma    with severe allergies pt does use albuterol inhaler when needed  . Depression   . Diabetes mellitus without complication ([place]) 2500   TYPE 2   . GERD (gastroesophageal reflux disease)    hx  of had Nissen Fundoplication 3704 has not had any issues since  . History of hiatal hernia   . Hyperlipidemia 2004  . Hypertension   . IBS (irritable bowel syndrome)   . Lynch syndrome   . Tubular adenoma of colon    Family history- Reviewed and unchanged Social history- Reviewed and unchanged   Review of Systems:  Review of Systems  Constitutional: Negative for malaise/fatigue and weight loss.  HENT: Negative for hearing loss and tinnitus.   Eyes: Negative for blurred vision and double vision.  Respiratory: Negative for cough, shortness of breath and wheezing.   Cardiovascular: Negative for chest pain, palpitations, orthopnea, claudication and leg swelling.  Gastrointestinal: Negative for abdominal pain, blood in stool, constipation, diarrhea, heartburn, melena, nausea and vomiting.  Genitourinary: Negative.   Musculoskeletal: Negative for joint pain and myalgias.  Skin: Negative for rash.  Neurological: Negative for dizziness, tingling, sensory change, weakness and headaches.  Endo/Heme/Allergies: Negative for polydipsia.  Psychiatric/Behavioral: Negative for depression. The patient is not nervous/anxious and does not have insomnia.   All other systems reviewed and are negative.   Physical Exam: BP 130/83   Pulse (!) 110   Wt 168 lb (76.2 kg)   LMP  (LMP Unknown)   BMI 28.84 kg/m  Wt Readings from Last 3 Encounters:  09/16/18 168 lb (76.2 kg)  06/13/18 168 lb 12.8 oz (76.6 kg)  03/21/18 164 lb (74.4 kg)  General Appearance:Well sounding, in no apparent distress.  ENT/Mouth: No hoarseness, No cough for duration of visit.  Respiratory: completing full sentences without distress, without audible wheeze Neuro: Awake and oriented X 3,  Psych:  Insight and Judgment appropriate.     Vicie Mutters, PA-C 9:50 AM [facility]

## 2018-09-16 ENCOUNTER — Other Ambulatory Visit: Payer: Self-pay

## 2018-09-16 ENCOUNTER — Encounter: Payer: Self-pay | Admitting: Physician Assistant

## 2018-09-16 ENCOUNTER — Ambulatory Visit: Payer: 59 | Admitting: Physician Assistant

## 2018-09-16 VITALS — BP 130/83 | HR 110 | Wt 168.0 lb

## 2018-09-16 DIAGNOSIS — E559 Vitamin D deficiency, unspecified: Secondary | ICD-10-CM

## 2018-09-16 DIAGNOSIS — Z79899 Other long term (current) drug therapy: Secondary | ICD-10-CM

## 2018-09-16 DIAGNOSIS — E1165 Type 2 diabetes mellitus with hyperglycemia: Secondary | ICD-10-CM | POA: Diagnosis not present

## 2018-09-16 DIAGNOSIS — I10 Essential (primary) hypertension: Secondary | ICD-10-CM | POA: Diagnosis not present

## 2018-09-16 DIAGNOSIS — E785 Hyperlipidemia, unspecified: Secondary | ICD-10-CM

## 2018-09-16 DIAGNOSIS — E663 Overweight: Secondary | ICD-10-CM

## 2018-09-16 DIAGNOSIS — Z794 Long term (current) use of insulin: Secondary | ICD-10-CM

## 2018-09-16 DIAGNOSIS — E1169 Type 2 diabetes mellitus with other specified complication: Secondary | ICD-10-CM

## 2018-09-16 MED ORDER — OZEMPIC (1 MG/DOSE) 2 MG/1.5ML ~~LOC~~ SOPN: 1.0000 mg | PEN_INJECTOR | SUBCUTANEOUS | 3 refills | Status: DC

## 2018-09-16 MED ORDER — OZEMPIC (0.25 OR 0.5 MG/DOSE) 2 MG/1.5ML ~~LOC~~ SOPN: 0.5000 mg | PEN_INJECTOR | SUBCUTANEOUS | 0 refills | Status: DC

## 2018-09-16 NOTE — Patient Instructions (Signed)
Sent in message

## 2018-09-21 ENCOUNTER — Other Ambulatory Visit: Payer: 59

## 2018-09-23 ENCOUNTER — Emergency Department (HOSPITAL_BASED_OUTPATIENT_CLINIC_OR_DEPARTMENT_OTHER): Payer: 59

## 2018-09-23 ENCOUNTER — Emergency Department (HOSPITAL_BASED_OUTPATIENT_CLINIC_OR_DEPARTMENT_OTHER)
Admission: EM | Admit: 2018-09-23 | Discharge: 2018-09-23 | Disposition: A | Discharge status: Home / Self Care | Payer: 59 | Attending: Emergency Medicine | Admitting: Emergency Medicine

## 2018-09-23 ENCOUNTER — Other Ambulatory Visit: Payer: Self-pay

## 2018-09-23 ENCOUNTER — Encounter (HOSPITAL_BASED_OUTPATIENT_CLINIC_OR_DEPARTMENT_OTHER): Payer: Self-pay

## 2018-09-23 DIAGNOSIS — S8264XA Nondisplaced fracture of lateral malleolus of right fibula, initial encounter for closed fracture: Secondary | ICD-10-CM | POA: Diagnosis not present

## 2018-09-23 DIAGNOSIS — E119 Type 2 diabetes mellitus without complications: Secondary | ICD-10-CM | POA: Insufficient documentation

## 2018-09-23 DIAGNOSIS — I10 Essential (primary) hypertension: Secondary | ICD-10-CM | POA: Diagnosis not present

## 2018-09-23 DIAGNOSIS — Y998 Other external cause status: Secondary | ICD-10-CM | POA: Diagnosis not present

## 2018-09-23 DIAGNOSIS — E785 Hyperlipidemia, unspecified: Secondary | ICD-10-CM | POA: Diagnosis not present

## 2018-09-23 DIAGNOSIS — Y9289 Other specified places as the place of occurrence of the external cause: Secondary | ICD-10-CM | POA: Diagnosis not present

## 2018-09-23 DIAGNOSIS — Z79899 Other long term (current) drug therapy: Secondary | ICD-10-CM | POA: Insufficient documentation

## 2018-09-23 DIAGNOSIS — Y9301 Activity, walking, marching and hiking: Secondary | ICD-10-CM | POA: Diagnosis not present

## 2018-09-23 DIAGNOSIS — S99911A Unspecified injury of right ankle, initial encounter: Secondary | ICD-10-CM | POA: Diagnosis present

## 2018-09-23 DIAGNOSIS — X500XXA Overexertion from strenuous movement or load, initial encounter: Secondary | ICD-10-CM | POA: Insufficient documentation

## 2018-09-23 MED ORDER — BACITRACIN ZINC 500 UNIT/GM EX OINT
TOPICAL_OINTMENT | Freq: Once | CUTANEOUS | Status: AC
  Administered 2018-09-23: 18:00:00 via TOPICAL
  Filled 2018-09-23: qty 28.35

## 2018-09-23 NOTE — ED Triage Notes (Signed)
Pt states she fell off bed and injured right ankle today-NAD-to triage in w/c

## 2018-09-23 NOTE — Discharge Instructions (Signed)
It was my pleasure taking care of you today!   Call your orthopedic doctor Monday morning to schedule a follow up appointment.   Ice and elevate to help with pain / swelling.  Alternate between Tylenol and Ibuprofen as needed for pain.   Return to ER for new or worsening symptoms, any additional concerns.

## 2018-09-23 NOTE — ED Provider Notes (Signed)
[facility] EMERGENCY DEPARTMENT Provider Note   CSN: [id number] Arrival date & time: 09/23/18  1715    History   Chief Complaint Chief Complaint  Patient presents with  . Ankle Injury    HPI Sydney Dickson is a 63 y.o. female.     The history is provided by the patient and medical records. No language interpreter was used.  Ankle Injury Pertinent negatives include no headaches.   Sydney Dickson is a 63 y.o. female  with a PMH as listed below who presents to the Emergency Department complaining of right ankle pain.  Patient states that she was trying to vacuum her ceiling fans when she lost her balance on the bed and rolled her right ankle.  This occurred earlier this morning.  She took an ibuprofen and placed an ankle brace on the ankle.  Ibuprofen did seem to help, but her pain has been persistent.  She noticed some bruising to the bottom of her foot, therefore came to the emergency department today to make sure that nothing was broken.  Pain is worse with ambulation.  She has crutches at home which she has been using to help with that.  No numbness or weakness.  Did not hit her head.  Not on anticoagulation.  Did cut her bottom toe on something, but is unsure what.  Her tetanus is up-to-date.  Past Medical History:  Diagnosis Date  . ADD (attention deficit disorder)   . Anxiety   . Asthma    with severe allergies pt does use albuterol inhaler when needed  . Depression   . Diabetes mellitus without complication ([place]) 7829   TYPE 2   . GERD (gastroesophageal reflux disease)    hx of had Nissen Fundoplication 5621 has not had any issues since  . History of hiatal hernia   . Hyperlipidemia 2004  . Hypertension   . IBS (irritable bowel syndrome)   . Lynch syndrome   . Tubular adenoma of colon     Patient Active Problem List   Diagnosis Date Noted  . MSH2-related Lynch syndrome (HNPCC1) 03/18/2017  . Overweight (BMI 25.0-29.9) 10/11/2014  . Vitamin D deficiency  10/11/2014  . Medication management 10/11/2014  . 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 ADD (attention deficit disorder)   . Essential hypertension   . Hyperlipidemia associated with type 2 diabetes mellitus ([place])   . Type 2 diabetes mellitus with hyperglycemia ([place])   . Varicose vein of leg 01/24/2013    Past Surgical History:  Procedure Laterality Date  . [place], 2001  . CHOLECYSTECTOMY    . colonscopy     . COSMETIC SURGERY     rhinoplasty  . EYE SURGERY  2006   lasik  . HERNIA REPAIR    . NASAL SINUS SURGERY    . NISSEN FUNDOPLICATION  3086  . PARTIAL PROCTECTOMY BY TEM N/A 07/19/2014   Procedure: PARTIAL PROCTECTOMY BY TEM;  Surgeon: Michael Boston, MD;  Location: [facility];  Service: General;  Laterality: N/A;  . ROTATOR CUFF REPAIR Right 2012  . TUBAL LIGATION    . UPPER GI ENDOSCOPY       OB History   No obstetric history on file.      Home Medications    Prior to Admission medications   Medication Sig Start Date End Date Taking? Authorizing Provider  acetaminophen (TYLENOL) 500 MG tablet Take 1,000 mg by mouth daily as needed for  moderate pain or headache.    [provider]  albuterol (PROVENTIL HFA;VENTOLIN HFA) 108 (90 Base) MCG/ACT inhaler  Use 1 to 2 Inhalations 15 minutes apart every 4 hours to Rescue Asthma Patient taking differently: as needed.  09/14/17   Unk Pinto, MD  ALPRAZolam Duanne Moron) 0.5 MG tablet TAKE 1 TABLET (0.5 MG TOTAL) BY MOUTH 2 (TWO) TIMES DAILY AS NEEDED. 11/01/17   Liane Comber, NP  amitriptyline (ELAVIL) 50 MG tablet TAKE 1/2-1 TABLET AT NIGHT FOR SLEEP AS NEEDED 08/17/18   Liane Comber, NP  aspirin 81 MG tablet Take 81 mg by mouth daily.    [provider]  CHOLECALCIFEROL PO Take 5,000 Units by mouth daily.    [provider]  escitalopram (LEXAPRO) 20 MG tablet Take 1 tablet Daily for Mood 07/22/18   Unk Pinto, MD  fluticasone [facility])  50 MCG/ACT nasal spray Place 2 sprays into both nostrils daily. 03/21/18   Liane Comber, NP  gabapentin (NEURONTIN) 300 MG capsule TAKE 1 CAPSULE BY MOUTH THREE TIMES A DAY 07/05/18   Liane Comber, NP  ibuprofen (ADVIL,MOTRIN) 800 MG tablet Take 1 tablet (800 mg total) by mouth every 8 (eight) hours as needed. 05/14/14   Kelby Aline, PA-C  insulin NPH-regular Human (NOVOLIN 70/30) (70-30) 100 UNIT/ML injection INJECT 25 UNITS IN THE AM AND 20 UNITS AT [facility] 06/18/18   Unk Pinto, MD  Insulin Syringe-Needle U-100 (BD INSULIN SYRINGE U/F) 31G X 5/16" 1 ML MISC Use twice a day 06/07/18   Liane Comber, NP  levocetirizine (XYZAL) 5 MG tablet TAKE 1 TABLET BY MOUTH EVERY DAY 07/01/18   Vicie Mutters, PA-C  lisdexamfetamine (VYVANSE) 30 MG capsule Take 1 capsule (30 mg total) by mouth every morning. 07/04/18   Vicie Mutters, PA-C  losartan (COZAAR) 100 MG tablet TAKE 1/2-1 TABLET DAILY FOR BLOOD PRESSURE AND KIDNEY PROTECTION FROM DM 09/18/17   Unk Pinto, MD  metFORMIN (GLUCOPHAGE-XR) 500 MG 24 hr tablet Take 2 tablets 2 x /day with meals for Diabetes 04/14/18   Unk Pinto, MD  olopatadine (PATANOL) 0.1 % ophthalmic solution PLACE 1 DROP INTO BOTH EYES 2 (TWO) TIMES DAILY. 07/28/17   Unk Pinto, MD  promethazine-dextromethorphan (PROMETHAZINE-DM) 6.25-15 MG/5ML syrup Take 5 mLs by mouth 4 (four) times daily as needed for cough. 03/21/18   Liane Comber, NP  rosuvastatin (CRESTOR) 20 MG tablet TAKE 1 TABLET (20 MG TOTAL) BY MOUTH EVERY OTHER DAY. 11/01/17   Liane Comber, NP  Semaglutide, 1 MG/DOSE, (OZEMPIC, 1 MG/DOSE,) 2 MG/1.5ML SOPN Inject 1 mg into the skin once a week. 09/16/18   Vicie Mutters, PA-C  Semaglutide,0.25 or 0.5MG/DOS, (OZEMPIC, 0.25 OR 0.5 MG/DOSE,) 2 MG/1.5ML SOPN Inject 0.5 mg into the skin once a week. 09/16/18   Vicie Mutters, PA-C  tiZANidine (ZANAFLEX) 2 MG tablet TAKE 1 TABLET (2 MG TOTAL) BY MOUTH EVERY 6 (SIX) HOURS AS NEEDED FOR MUSCLE SPASMS.  07/08/18   Liane Comber, NP    Family History Family History  Problem Relation Age of Onset  . Cancer Mother        breast  . Hyperlipidemia Mother   . Hypertension Mother   . Diabetes Mother   . Breast cancer Mother   . Hyperlipidemia Father   . Hypertension Father   . Colon cancer Father 57  . Skin cancer Father   . Prostate cancer Father   . Breast cancer Sister   . Colon cancer Brother 81  . Cancer Paternal Grandmother  cervical and ? uterine  . Colon cancer Paternal Grandfather 42  . Stomach cancer Neg Hx   . Esophageal cancer Neg Hx     Social History Social History   Tobacco Use  . Smoking status: Never Smoker  . Smokeless tobacco: Never Used  Substance Use Topics  . Alcohol use: No  . Drug use: No     Allergies   Tape, Ace inhibitors, Metformin and related, Morphine and related, Penicillins, and Wellbutrin [bupropion]   Review of Systems Review of Systems  Musculoskeletal: Positive for arthralgias and myalgias.  Skin: Positive for color change and wound.  Neurological: Negative for weakness, numbness and headaches.     Physical Exam Updated Vital Signs BP (!) 145/90 (BP Location: Right Arm)   Pulse 90   Temp 98.1 F (36.7 C) (Oral)   Resp 20   LMP  (LMP Unknown)   SpO2 97%   Physical Exam Vitals signs and nursing note reviewed.  Constitutional:      General: She is not in acute distress.    Appearance: She is well-developed.  HENT:     Head: Normocephalic and atraumatic.  Neck:     Musculoskeletal: Neck supple.  Cardiovascular:     Rate and Rhythm: Normal rate and regular rhythm.     Heart sounds: Normal heart sounds. No murmur.  Pulmonary:     Effort: Pulmonary effort is normal. No respiratory distress.     Breath sounds: Normal breath sounds. No wheezing or rales.  Musculoskeletal:     Comments: Right lateral malleolus tender to palpation with surrounding swelling and ecchymosis.  Full range of motion.  2+ DP.  Sensation is  intact.  Good cap refill of the digits.  Does have 2 cm superficial laceration to the great toe.  Skin:    General: Skin is warm and dry.  Neurological:     Mental Status: She is alert.      ED Treatments / Results  Labs (all labs ordered are listed, but only abnormal results are displayed) Labs Reviewed - No data to display  EKG None  Radiology Dg Ankle Complete Right  Result Date: 09/23/2018 CLINICAL DATA:  Twisting injury right ankle in a fall today. Initial encounter. EXAM: RIGHT ANKLE - COMPLETE 3+ VIEW COMPARISON:  None. FINDINGS: There is an acute nondisplaced fracture of the distal aspect of the lateral malleolus with overlying soft tissue swelling. No other acute bony or joint abnormality is identified. Small plantar calcaneal spur noted. IMPRESSION: Acute nondisplaced fracture distal aspect of the lateral malleolus with overlying soft tissue swelling. Electronically Signed   By: Inge Rise M.D.   On: 09/23/2018 19:18   Dg Foot Complete Right  Result Date: 09/23/2018 CLINICAL DATA:  Twisting injury right foot and ankle today. Initial encounter. EXAM: RIGHT FOOT COMPLETE - 3+ VIEW COMPARISON:  None. FINDINGS: Nondisplaced fracture of the tip of the lateral malleolus with overlying soft tissue swelling noted. No other acute bony or joint abnormality. IMPRESSION: Acute nondisplaced fracture tip of the lateral malleolus. Otherwise negative. Electronically Signed   By: Inge Rise M.D.   On: 09/23/2018 19:19    Procedures Procedures (including critical care time)  Medications Ordered in ED Medications  bacitracin ointment ( Topical Given 09/23/18 1759)     Initial Impression / Assessment and Plan / ED Course  I have reviewed the triage vital signs and the nursing notes.  Pertinent labs & imaging results that were available during my care of the patient were  reviewed by me and considered in my medical decision making (see chart for details).       Sydney Dickson is a 63 y.o. female who presents to ED for evaluation of right ankle pain after fall today.  She did cut the bottom of her great toe on something as well.  Her tetanus is up-to-date.  Wound was thoroughly cleaned in the emergency department.  It is very superficial and do not believe would benefit from suturing.  Counseled on home wound care instructions.  X-ray of the ankle/foot obtained showing acute nondisplaced distal fibular fracture.  Will place in a cam walker and have her follow-up with her orthopedist.  Home care instructions and return precautions discussed.  All questions answered.  Final Clinical Impressions(s) / ED Diagnoses   Final diagnoses:  Closed nondisplaced fracture of lateral malleolus of right fibula, initial encounter    ED Discharge Orders    None       Ward, Ozella Almond, PA-C 09/23/18 1934    Davonna Belling, MD 09/23/18 2347

## 2018-09-25 ENCOUNTER — Other Ambulatory Visit: Payer: Self-pay | Admitting: Adult Health

## 2018-09-25 DIAGNOSIS — E119 Type 2 diabetes mellitus without complications: Secondary | ICD-10-CM

## 2018-09-27 ENCOUNTER — Other Ambulatory Visit: Payer: Self-pay

## 2018-09-27 ENCOUNTER — Ambulatory Visit (INDEPENDENT_AMBULATORY_CARE_PROVIDER_SITE_OTHER): Payer: 59 | Admitting: Orthopaedic Surgery

## 2018-09-27 ENCOUNTER — Encounter: Payer: Self-pay | Admitting: Orthopaedic Surgery

## 2018-09-27 DIAGNOSIS — S8265XA Nondisplaced fracture of lateral malleolus of left fibula, initial encounter for closed fracture: Secondary | ICD-10-CM | POA: Diagnosis not present

## 2018-09-27 MED ORDER — TRAMADOL HCL 50 MG PO TABS: 50.0000 mg | ORAL_TABLET | Freq: Four times a day (QID) | ORAL | 0 refills | Status: DC | PRN

## 2018-09-27 NOTE — Progress Notes (Signed)
Office Visit Note   Patient: Sydney Dickson           Date of Birth: Apr 14, 1955           MRN: [id number] Visit Date: 09/27/2018              Requested by: Unk Pinto, MD [address] [place],   [address] PCP: Unk Pinto, MD   Assessment & Plan: Visit Diagnoses:  1. Nondisplaced fracture of lateral malleolus of left fibula, initial encounter for closed fracture     Plan:  #1: continue with the equalizer boot for at least a month #2: That time she can wean into the ankle brace that she has #3: She can return at any point in time if she is having problems  Follow-Up Instructions: Return in about 3 weeks (around 10/18/2018), or if symptoms worsen or fail to improve.   Orders:  No orders of the defined types were placed in this encounter.  Meds ordered this encounter  Medications  . traMADol (ULTRAM) 50 MG tablet    Sig: Take 1 tablet (50 mg total) by mouth every 6 (six) hours as needed.    Dispense:  20 tablet    Refill:  0    Order Specific Question:   Supervising Provider    Answer:   Garald Balding [[id number]]      Procedures: No procedures performed   Clinical Data: No additional findings.   Subjective: Chief Complaint  Patient presents with  . Right Ankle - Pain  HPI Patient presents today for right ankle pain. She fell from her bed while standing on it to clean her ceiling fans on Friday 09/23/18. She went to [facility] ER on [address] that same day. She had x-rays taken and was told she had fractured her ankle. Her pain is located on the lateral side of her ankle. She in a walking boot and using crutches to get around. She is taking Ibuprofen as needed for pain.      Review of Systems  Constitutional: Negative for fatigue.  HENT: Negative for ear pain.   Eyes: Negative for pain.  Respiratory: Negative for shortness of breath.   Cardiovascular: Negative for leg swelling.  Gastrointestinal: Negative for constipation and diarrhea.   Endocrine: Negative for cold intolerance and heat intolerance.  Genitourinary: Negative for difficulty urinating.  Musculoskeletal: Positive for joint swelling.  Skin: Negative for rash.  Allergic/Immunologic: Positive for food allergies.  Neurological: Negative for weakness.  Hematological: Does not bruise/bleed easily.  Psychiatric/Behavioral: Positive for sleep disturbance.     Objective: Vital Signs: BP (!) 155/86   Pulse (!) 112   Ht '5\' 4"'$  (1.626 m)   Wt 160 lb (72.6 kg)   LMP  (LMP Unknown)   BMI 27.46 kg/m   Physical Exam Constitutional:      Appearance: She is well-developed.  Eyes:     Pupils: Pupils are equal, round, and reactive to light.  Pulmonary:     Effort: Pulmonary effort is normal.  Skin:    General: Skin is warm and dry.  Neurological:     Mental Status: She is alert and oriented to person, place, and time.  Psychiatric:        Behavior: Behavior normal.     Ortho Exam  Exam today reveals some swelling about the lateral malleolus.  She is tender over the distal fibula.  Some tenderness over the proximal fifth metatarsal.  Neurovascular intact distally.  Good capillary  refill.  No medial pain.  No calcaneal pain. Specialty Comments:  No specialty comments available.  Imaging: Dg Ankle Complete Right  Result Date: 09/23/2018 CLINICAL DATA:  Twisting injury right ankle in a fall today. Initial encounter. EXAM: RIGHT ANKLE - COMPLETE 3+ VIEW COMPARISON:  None. FINDINGS: There is an acute nondisplaced fracture of the distal aspect of the lateral malleolus with overlying soft tissue swelling. No other acute bony or joint abnormality is identified. Small plantar calcaneal spur noted. IMPRESSION: Acute nondisplaced fracture distal aspect of the lateral malleolus with overlying soft tissue swelling. Electronically Signed   By: Inge Rise M.D.   On: 09/23/2018 19:18   Dg Foot Complete Right  Result Date: 09/23/2018 CLINICAL DATA:  Twisting injury  right foot and ankle today. Initial encounter. EXAM: RIGHT FOOT COMPLETE - 3+ VIEW COMPARISON:  None. FINDINGS: Nondisplaced fracture of the tip of the lateral malleolus with overlying soft tissue swelling noted. No other acute bony or joint abnormality. IMPRESSION: Acute nondisplaced fracture tip of the lateral malleolus. Otherwise negative. Electronically Signed   By: Inge Rise M.D.   On: 09/23/2018 19:19    PMFS History: Current Outpatient Medications  Medication Sig Dispense Refill  . acetaminophen (TYLENOL) 500 MG tablet Take 1,000 mg by mouth daily as needed for moderate pain or headache.    . albuterol (PROVENTIL HFA;VENTOLIN HFA) 108 (90 Base) MCG/ACT inhaler  Use 1 to 2 Inhalations 15 minutes apart every 4 hours to Rescue Asthma (Patient taking differently: as needed. ) 48 g 3  . ALPRAZolam (XANAX) 0.5 MG tablet TAKE 1 TABLET (0.5 MG TOTAL) BY MOUTH 2 (TWO) TIMES DAILY AS NEEDED. 60 tablet 0  . amitriptyline (ELAVIL) 50 MG tablet TAKE 1/2-1 TABLET AT NIGHT FOR SLEEP AS NEEDED 90 tablet 0  . aspirin 81 MG tablet Take 81 mg by mouth daily.    . CHOLECALCIFEROL PO Take 5,000 Units by mouth daily.    Marland Kitchen escitalopram (LEXAPRO) 20 MG tablet Take 1 tablet Daily for Mood 90 tablet 0  . fluticasone (FLONASE) 50 MCG/ACT nasal spray Place 2 sprays into both nostrils daily. 48 g 1  . gabapentin (NEURONTIN) 300 MG capsule TAKE 1 CAPSULE BY MOUTH THREE TIMES A DAY 270 capsule 1  . ibuprofen (ADVIL,MOTRIN) 800 MG tablet Take 1 tablet (800 mg total) by mouth every 8 (eight) hours as needed. 90 tablet 1  . Insulin Syringe-Needle U-100 (BD INSULIN SYRINGE U/F) 31G X 5/16" 1 ML MISC Use twice a day 100 each 2  . levocetirizine (XYZAL) 5 MG tablet TAKE 1 TABLET BY MOUTH EVERY DAY 90 tablet 1  . lisdexamfetamine (VYVANSE) 30 MG capsule Take 1 capsule (30 mg total) by mouth every morning. 90 capsule 0  . losartan (COZAAR) 100 MG tablet TAKE 1/2-1 TABLET DAILY FOR BLOOD PRESSURE AND KIDNEY PROTECTION FROM  DM 90 tablet 1  . olopatadine (PATANOL) 0.1 % ophthalmic solution PLACE 1 DROP INTO BOTH EYES 2 (TWO) TIMES DAILY. 5 mL 5  . promethazine-dextromethorphan (PROMETHAZINE-DM) 6.25-15 MG/5ML syrup Take 5 mLs by mouth 4 (four) times daily as needed for cough. 240 mL 1  . rosuvastatin (CRESTOR) 20 MG tablet TAKE 1 TABLET (20 MG TOTAL) BY MOUTH EVERY OTHER DAY. 90 tablet 1  . Semaglutide, 1 MG/DOSE, (OZEMPIC, 1 MG/DOSE,) 2 MG/1.5ML SOPN Inject 1 mg into the skin once a week. 8 pen 3  . Semaglutide,0.25 or 0.'5MG'$ /DOS, (OZEMPIC, 0.25 OR 0.5 MG/DOSE,) 2 MG/1.5ML SOPN Inject 0.5 mg into the skin once a  week. 2 pen 0  . tiZANidine (ZANAFLEX) 2 MG tablet TAKE 1 TABLET (2 MG TOTAL) BY MOUTH EVERY 6 (SIX) HOURS AS NEEDED FOR MUSCLE SPASMS. 60 tablet 0  . insulin NPH-regular Human (NOVOLIN 70/30) (70-30) 100 UNIT/ML injection INJECT 25 UNITS IN THE AM AND 20 UNITS AT DINNER (Patient not taking: Reported on 09/27/2018) 90 mL 1  . metFORMIN (GLUCOPHAGE-XR) 500 MG 24 hr tablet Take 2 tablets 2 x /day with meals for Diabetes (Patient not taking: Reported on 09/27/2018) 360 tablet 1  . traMADol (ULTRAM) 50 MG tablet Take 1 tablet (50 mg total) by mouth every 6 (six) hours as needed. 20 tablet 0   No current facility-administered medications for this visit.     Patient Active Problem List   Diagnosis Date Noted  . Nondisplaced fracture of lateral malleolus of left fibula, initial encounter for closed fracture 09/27/2018  . MSH2-related Lynch syndrome (HNPCC1) 03/18/2017  . Overweight (BMI 25.0-29.9) 10/11/2014  . Vitamin D deficiency 10/11/2014  . Medication management 10/11/2014  . 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 ADD (attention deficit disorder)   . Essential hypertension   . Hyperlipidemia associated with type 2 diabetes mellitus ([place])   . Type 2 diabetes mellitus with hyperglycemia ([place])   . Varicose vein of leg 01/24/2013   Past Medical History:  Diagnosis Date   . ADD (attention deficit disorder)   . Anxiety   . Asthma    with severe allergies pt does use albuterol inhaler when needed  . Depression   . Diabetes mellitus without complication ([place]) 5329   TYPE 2   . GERD (gastroesophageal reflux disease)    hx of had Nissen Fundoplication 9242 has not had any issues since  . History of hiatal hernia   . Hyperlipidemia 2004  . Hypertension   . IBS (irritable bowel syndrome)   . Lynch syndrome   . Tubular adenoma of colon     Family History  Problem Relation Age of Onset  . Cancer Mother        breast  . Hyperlipidemia Mother   . Hypertension Mother   . Diabetes Mother   . Breast cancer Mother   . Hyperlipidemia Father   . Hypertension Father   . Colon cancer Father 70  . Skin cancer Father   . Prostate cancer Father   . Breast cancer Sister   . Colon cancer Brother 55  . Cancer Paternal Grandmother        cervical and ? uterine  . Colon cancer Paternal Grandfather 27  . Stomach cancer Neg Hx   . Esophageal cancer Neg Hx     Past Surgical History:  Procedure Laterality Date  . [place], 2001  . CHOLECYSTECTOMY    . colonscopy     . COSMETIC SURGERY     rhinoplasty  . EYE SURGERY  2006   lasik  . HERNIA REPAIR    . NASAL SINUS SURGERY    . NISSEN FUNDOPLICATION  6834  . PARTIAL PROCTECTOMY BY TEM N/A 07/19/2014   Procedure: PARTIAL PROCTECTOMY BY TEM;  Surgeon: Michael Boston, MD;  Location: [facility];  Service: General;  Laterality: N/A;  . ROTATOR CUFF REPAIR Right 2012  . TUBAL LIGATION    . UPPER GI ENDOSCOPY     Social History   Occupational History  . Not on file  Tobacco Use  . Smoking status: Never Smoker  . Smokeless tobacco: Never Used  Substance and Sexual Activity  . Alcohol use: No  . Drug use: No  . Sexual activity: Not on file

## 2018-09-28 ENCOUNTER — Other Ambulatory Visit: Payer: 59

## 2018-09-28 ENCOUNTER — Other Ambulatory Visit: Payer: Self-pay

## 2018-09-28 DIAGNOSIS — E785 Hyperlipidemia, unspecified: Secondary | ICD-10-CM

## 2018-09-28 DIAGNOSIS — Z794 Long term (current) use of insulin: Secondary | ICD-10-CM

## 2018-09-28 DIAGNOSIS — E1165 Type 2 diabetes mellitus with hyperglycemia: Secondary | ICD-10-CM

## 2018-09-28 DIAGNOSIS — R5383 Other fatigue: Secondary | ICD-10-CM

## 2018-09-28 DIAGNOSIS — Z79899 Other long term (current) drug therapy: Secondary | ICD-10-CM

## 2018-09-28 DIAGNOSIS — Z130 Encounter for screening for diseases of the blood and blood-forming organs and certain disorders involving the immune mechanism: Secondary | ICD-10-CM

## 2018-09-28 DIAGNOSIS — I10 Essential (primary) hypertension: Secondary | ICD-10-CM

## 2018-09-28 DIAGNOSIS — E559 Vitamin D deficiency, unspecified: Secondary | ICD-10-CM

## 2018-09-28 DIAGNOSIS — E1169 Type 2 diabetes mellitus with other specified complication: Secondary | ICD-10-CM

## 2018-09-28 LAB — HM DIABETES EYE EXAM

## 2018-09-29 ENCOUNTER — Encounter: Payer: Self-pay | Admitting: *Deleted

## 2018-09-29 LAB — CBC WITH DIFFERENTIAL/PLATELET
Absolute Monocytes: 380 cells/uL (ref 200–950)
Basophils Absolute: 102 cells/uL (ref 0–200)
Basophils Relative: 1.4 %
Eosinophils Absolute: 270 cells/uL (ref 15–500)
Eosinophils Relative: 3.7 %
HCT: 40.8 % (ref 35.0–45.0)
Hemoglobin: 13.4 g/dL (ref 11.7–15.5)
Lymphs Abs: 2351 cells/uL (ref 850–3900)
MCH: 28.4 pg (ref 27.0–33.0)
MCHC: 32.8 g/dL (ref 32.0–36.0)
MCV: 86.4 fL (ref 80.0–100.0)
MPV: 10.3 fL (ref 7.5–12.5)
Monocytes Relative: 5.2 %
Neutro Abs: 4198 cells/uL (ref 1500–7800)
Neutrophils Relative %: 57.5 %
Platelets: 365 10*3/uL (ref 140–400)
RBC: 4.72 10*6/uL (ref 3.80–5.10)
RDW: 12.2 % (ref 11.0–15.0)
Total Lymphocyte: 32.2 %
WBC: 7.3 10*3/uL (ref 3.8–10.8)

## 2018-09-29 LAB — COMPLETE METABOLIC PANEL WITH GFR
AG Ratio: 1.4 (calc) (ref 1.0–2.5)
ALT: 25 U/L (ref 6–29)
AST: 15 U/L (ref 10–35)
Albumin: 3.9 g/dL (ref 3.6–5.1)
Alkaline phosphatase (APISO): 103 U/L (ref 37–153)
BUN: 16 mg/dL (ref 7–25)
CO2: 25 mmol/L (ref 20–32)
Calcium: 9.5 mg/dL (ref 8.6–10.4)
Chloride: 100 mmol/L (ref 98–110)
Creat: 0.82 mg/dL (ref 0.50–0.99)
GFR, Est African American: 89 mL/min/{1.73_m2} (ref >=60)
GFR, Est Non African American: 77 mL/min/{1.73_m2} (ref >=60)
Globulin: 2.7 g/dL (calc) (ref 1.9–3.7)
Glucose, Bld: 478 mg/dL — ABNORMAL HIGH (ref 65–99)
Potassium: 4.3 mmol/L (ref 3.5–5.3)
Sodium: 134 mmol/L — ABNORMAL LOW (ref 135–146)
Total Bilirubin: 0.6 mg/dL (ref 0.2–1.2)
Total Protein: 6.6 g/dL (ref 6.1–8.1)

## 2018-09-29 LAB — IRON, TOTAL/TOTAL IRON BINDING CAP
%SAT: 24 % (calc) (ref 16–45)
Iron: 76 ug/dL (ref 45–160)
TIBC: 311 mcg/dL (calc) (ref 250–450)

## 2018-09-29 LAB — MAGNESIUM: Magnesium: 1.6 mg/dL (ref 1.5–2.5)

## 2018-09-29 LAB — LIPID PANEL
Cholesterol: 142 mg/dL (ref <200)
HDL: 50 mg/dL (ref >=50)
LDL Cholesterol (Calc): 72 mg/dL (calc)
Non-HDL Cholesterol (Calc): 92 mg/dL (calc) (ref <130)
Total CHOL/HDL Ratio: 2.8 (calc) (ref <5.0)
Triglycerides: 123 mg/dL (ref <150)

## 2018-09-29 LAB — FERRITIN: Ferritin: 114 ng/mL (ref 16–288)

## 2018-09-29 LAB — VITAMIN D 25 HYDROXY (VIT D DEFICIENCY, FRACTURES): Vit D, 25-Hydroxy: 59 ng/mL (ref 30–100)

## 2018-09-29 LAB — TSH: TSH: 2.61 mIU/L (ref 0.40–4.50)

## 2018-09-29 LAB — HEMOGLOBIN A1C
Hgb A1c MFr Bld: 9.8 % of total Hgb — ABNORMAL HIGH (ref <5.7)
Mean Plasma Glucose: 235 (calc)
eAG (mmol/L): 13 (calc)

## 2018-09-29 LAB — VITAMIN B12: Vitamin B-12: 581 pg/mL (ref 200–1100)

## 2018-10-11 ENCOUNTER — Other Ambulatory Visit: Payer: Self-pay | Admitting: Physician Assistant

## 2018-10-16 ENCOUNTER — Other Ambulatory Visit: Payer: Self-pay | Admitting: Internal Medicine

## 2018-10-16 DIAGNOSIS — F330 Major depressive disorder, recurrent, mild: Secondary | ICD-10-CM

## 2018-10-19 ENCOUNTER — Other Ambulatory Visit: Payer: Self-pay | Admitting: Adult Health

## 2018-10-19 DIAGNOSIS — E785 Hyperlipidemia, unspecified: Secondary | ICD-10-CM

## 2018-11-10 ENCOUNTER — Other Ambulatory Visit: Payer: Self-pay | Admitting: Adult Health

## 2018-11-10 DIAGNOSIS — Z79899 Other long term (current) drug therapy: Secondary | ICD-10-CM

## 2018-12-14 ENCOUNTER — Encounter: Payer: Self-pay | Admitting: Internal Medicine

## 2018-12-23 ENCOUNTER — Other Ambulatory Visit: Payer: Self-pay | Admitting: Physician Assistant

## 2019-01-03 ENCOUNTER — Other Ambulatory Visit: Payer: Self-pay | Admitting: Physician Assistant

## 2019-01-03 MED ORDER — LOSARTAN POTASSIUM 100 MG PO TABS: ORAL_TABLET | ORAL | 0 refills | Status: DC

## 2019-01-03 MED ORDER — METFORMIN HCL ER 750 MG PO TB24: 750.0000 mg | ORAL_TABLET | Freq: Two times a day (BID) | ORAL | 0 refills | Status: DC

## 2019-01-03 MED ORDER — NOVOLIN 70/30 (70-30) 100 UNIT/ML ~~LOC~~ SUSP: SUBCUTANEOUS | 0 refills | Status: DC

## 2019-01-03 NOTE — Progress Notes (Signed)
Future Appointments  Date Time Provider Reserve  01/12/2019 10:30 AM [facility] PREVISIT RM [facility]  01/26/2019  8:00 AM Pyrtle, Lajuan Lines, MD [facility]  06/19/2019  2:00 PM Vicie Mutters, PA-C [facility] None   Needs follow up in Oct for 3 month OV, overdue with DM

## 2019-01-11 NOTE — Progress Notes (Signed)
FOLLOW UP  Assessment and Plan:   HA with left sided tinnitus Declines work up with CTA or MRI to rule out aneurysm Will increase losartan to 100mg  Get cataract surgery  And if continue to have HA will get imaging Will go to the ER if worsening headache, changes vision/speech, imbalance, weakness.  Hypertension Will increase the losartan to 100 mg Monitor blood pressure at home; patient to call if consistently greater than 130/80 Continue DASH diet.   Reminder to go to the ER if any CP, SOB, nausea, dizziness, severe HA, changes vision/speech, left arm numbness and tingling and jaw pain.  Cholesterol Currently near goal; currently taking 10 mg rosuvastatin every other day- never increased as she was advised to do Continue low cholesterol diet and exercise.  Check lipid panel.   Diabetes without complications Continue insulin and metformin Check A1C Not seeing endocrinology anymore  Overweight Long discussion about weight loss, diet, and exercise Recommended diet heavy in fruits and veggies and low in animal meats, cheeses, and dairy products, appropriate calorie intake Discussed ideal weight for height Will follow up in 3 months  Vitamin D Def Below goal at last visit; taking 5000 IU daily  check Vit D level  Depression/anxiety Continue medications  Lifestyle discussed: diet/exerise, sleep hygiene, stress management, hydration   Continue diet and meds as discussed. Further disposition pending results of labs. Discussed med's effects and SE's.   Over 30 minutes of exam, counseling, chart review, and critical decision making was performed.   Future Appointments  Date Time Provider [place]  01/16/2019 10:45 AM Vicie Mutters, PA-C [facility] None  01/26/2019  8:00 AM Pyrtle, Lajuan Lines, MD [facility]  06/19/2019  2:00 PM Vicie Mutters, PA-C [facility] None     ----------------------------------------------------------------------------------------------------------------------  HPI 63 y.o. female  presents for 3 month follow up on hypertension, cholesterol, diabetes, weight and vitamin D deficiency.   She has had a mild low HA x 1 month, has pulsatile tinnitus in left ear.  She states she is always tired, normal B12 and iron in June. She has history of normal sleep study long time ago. She states she does not sleep well.   BMI is Body mass index is 28.67 kg/m., she has been working on diet, watches her grandson but otherwise not exercise.   Wt Readings from Last 3 Encounters:  01/16/19 167 lb (75.8 kg)  01/12/19 167 lb (75.8 kg)  09/27/18 160 lb (72.6 kg)   Her blood pressure has been controlled at home, today their BP is BP: (!) 142/90  She does not workout, but watches her 27 y/o grandchild and consequently very acitve She denies chest pain, shortness of breath, dizziness.   She has been working on diet and exercise for T2DM  With CKD on ARB Losartan 100 Hyperlipiemia on Crestor 10 mg every other day close to goal less than 70. She is on 70/30 15 in the AM and 10 at night.  She could not tolerate the ozepmic, had reaction site issues with trulicity but tolerated well and not itchy or bad and denies foot ulcerations, increased appetite, nausea, paresthesia of the feet and visual disturbances.  She was switched from 500ER metformin to the 750mg  ER due to recall.  She does check fasting sugars, reports currently running 120-140 [place] meter  Last A1C in the office was:  Lab Results  Component Value Date   HGBA1C 9.8 (H) 09/28/2018   Lab Results  Component Value Date   CHOL 142 09/28/2018  HDL 50 09/28/2018   LDLCALC 72 09/28/2018   TRIG 123 09/28/2018   CHOLHDL 2.8 09/28/2018    Lab Results  Component Value Date   GFRNONAA 77 09/28/2018    Patient is not on Vitamin D supplement.   Lab Results  Component Value Date    VD25OH 59 09/28/2018       Current Medications:  Current Outpatient Medications on File Prior to Visit  Medication Sig  . acetaminophen (TYLENOL) 500 MG tablet Take 1,000 mg by mouth daily as needed for moderate pain or headache.  . albuterol (PROVENTIL HFA;VENTOLIN HFA) 108 (90 Base) MCG/ACT inhaler  Use 1 to 2 Inhalations 15 minutes apart every 4 hours to Rescue Asthma (Patient taking differently: as needed. )  . ALPRAZolam (XANAX) 0.5 MG tablet TAKE 1 TABLET (0.5 MG TOTAL) BY MOUTH 2 (TWO) TIMES DAILY AS NEEDED.  Marland Kitchen amitriptyline (ELAVIL) 50 MG tablet TAKE 1/2-1 TABLET AT NIGHT FOR SLEEP AS NEEDED  . aspirin 81 MG tablet Take 81 mg by mouth daily.  . CHOLECALCIFEROL PO Take 5,000 Units by mouth daily.  Marland Kitchen escitalopram (LEXAPRO) 20 MG tablet Take 1 tablet Daily for Mood  . fluticasone (FLONASE) 50 MCG/ACT nasal spray Place 2 sprays into both nostrils daily.  Marland Kitchen gabapentin (NEURONTIN) 300 MG capsule TAKE 1 CAPSULE BY MOUTH THREE TIMES A DAY  . ibuprofen (ADVIL,MOTRIN) 800 MG tablet Take 1 tablet (800 mg total) by mouth every 8 (eight) hours as needed.  . insulin NPH-regular Human (NOVOLIN 70/30) (70-30) 100 UNIT/ML injection INJECT 25 UNITS IN THE AM AND 20 UNITS AT DINNER  . Insulin Syringe-Needle U-100 (BD INSULIN SYRINGE U/F) 31G X 5/16" 1 ML MISC Use twice a day  . levocetirizine (XYZAL) 5 MG tablet Take 1 tablet Daily for Allergies  . lisdexamfetamine (VYVANSE) 30 MG capsule Take 1 capsule (30 mg total) by mouth every morning.  Marland Kitchen losartan (COZAAR) 100 MG tablet TAKE 1/2-1 TABLET DAILY FOR BLOOD PRESSURE AND KIDNEY PROTECTION FROM DM  . metFORMIN (GLUCOPHAGE XR) 750 MG 24 hr tablet Take 1 tablet (750 mg total) by mouth 2 (two) times daily with a meal.  . olopatadine (PATANOL) 0.1 % ophthalmic solution PLACE 1 DROP INTO BOTH EYES 2 (TWO) TIMES DAILY.  Marland Kitchen promethazine-dextromethorphan (PROMETHAZINE-DM) 6.25-15 MG/5ML syrup Take 5 mLs by mouth 4 (four) times daily as needed for cough.  .  rosuvastatin (CRESTOR) 20 MG tablet Take 1 tablet daily for Cholesterol  . tiZANidine (ZANAFLEX) 2 MG tablet TAKE 1 TABLET (2 MG TOTAL) BY MOUTH EVERY 6 (SIX) HOURS AS NEEDED FOR MUSCLE SPASMS.  Marland Kitchen traMADol (ULTRAM) 50 MG tablet Take 1 tablet (50 mg total) by mouth every 6 (six) hours as needed.   No current facility-administered medications on file prior to visit.      Allergies:  Allergies  Allergen Reactions  . Tape Rash  . Ace Inhibitors Cough  . Metformin And Related     myalgia  . Morphine And Related Itching  . Penicillins     Pt states that in 3rd grade she was given an injection in the buttock and reacted with a large hive in that area Pt can take oral penicillin with no reaction    . Wellbutrin [Bupropion]     Felt funny     Medical History:  Past Medical History:  Diagnosis Date  . ADD (attention deficit disorder)   . Allergy   . Anxiety   . Arthritis   . Asthma    with  severe allergies pt does use albuterol inhaler when needed  . Cataract   . Depression   . Diabetes mellitus without complication ([place]) [id number]   TYPE 2   . GERD (gastroesophageal reflux disease)    hx of had Nissen Fundoplication [id number] has not had any issues since  . History of hiatal hernia   . Hyperlipidemia 2004  . Hypertension   . IBS (irritable bowel syndrome)   . Lynch syndrome   . Tubular adenoma of colon    Family history- Reviewed and unchanged Social history- Reviewed and unchanged   Review of Systems:  Review of Systems  Constitutional: Negative for malaise/fatigue and weight loss.  HENT: Negative for hearing loss and tinnitus.   Eyes: Negative for blurred vision and double vision.  Respiratory: Negative for cough, shortness of breath and wheezing.   Cardiovascular: Negative for chest pain, palpitations, orthopnea, claudication and leg swelling.  Gastrointestinal: Negative for abdominal pain, blood in stool, constipation, diarrhea, heartburn, melena, nausea and vomiting.   Genitourinary: Negative.   Musculoskeletal: Negative for joint pain and myalgias.  Skin: Negative for rash.  Neurological: Negative for dizziness, tingling, sensory change, weakness and headaches.  Endo/Heme/Allergies: Negative for polydipsia.  Psychiatric/Behavioral: Positive for depression. The patient is nervous/anxious and has insomnia.   All other systems reviewed and are negative.   Physical Exam: BP (!) 142/90   Temp (!) 97.4 F (36.3 C)   Wt 167 lb (75.8 kg)   LMP  (LMP Unknown)   SpO2 97%   BMI 28.67 kg/m  Wt Readings from Last 3 Encounters:  01/16/19 167 lb (75.8 kg)  01/12/19 167 lb (75.8 kg)  09/27/18 160 lb (72.6 kg)   General Appearance: Well nourished, in no apparent distress. Eyes: PERRLA, EOMs, conjunctiva no swelling or erythema Sinuses: No Frontal/maxillary tenderness ENT/Mouth: Ext aud canals clear, TMs without erythema, bulging. No erythema, swelling, or exudate on post pharynx.  Tonsils not swollen or erythematous. Hearing normal.  Neck: Supple, thyroid normal.  Respiratory: Respiratory effort normal, BS equal bilaterally without rales, rhonchi, wheezing or stridor.  Cardio: sinus tachy with no MRGs. Brisk peripheral pulses without edema.  Abdomen: Soft, + BS.  Non tender, no guarding, rebound, hernias, masses. Lymphatics: Non tender without lymphadenopathy.  Musculoskeletal: Full ROM, 5/5 strength, Normal gait Skin: Warm, dry without rashes, lesions, ecchymosis.  Neuro: Cranial nerves intact. No cerebellar symptoms.  Psych: Awake and oriented X 3, normal affect, Insight and Judgment appropriate.    Vicie Mutters, PA-C 10:37 AM [facility]

## 2019-01-12 ENCOUNTER — Ambulatory Visit (AMBULATORY_SURGERY_CENTER): Payer: Self-pay

## 2019-01-12 ENCOUNTER — Other Ambulatory Visit: Payer: Self-pay

## 2019-01-12 VITALS — Temp 97.1°F | Ht 64.0 in | Wt 167.0 lb

## 2019-01-12 DIAGNOSIS — Z1509 Genetic susceptibility to other malignant neoplasm: Secondary | ICD-10-CM

## 2019-01-12 DIAGNOSIS — Z86010 Personal history of colonic polyps: Secondary | ICD-10-CM

## 2019-01-12 MED ORDER — NA SULFATE-K SULFATE-MG SULF 17.5-3.13-1.6 GM/177ML PO SOLN: 1.0000 | Freq: Once | ORAL | 0 refills | Status: AC

## 2019-01-12 NOTE — Progress Notes (Signed)
Denies allergies to eggs or soy products. Denies complication of anesthesia or sedation. Denies use of weight loss medication. Denies use of O2.   Emmi instructions given for colonoscopy.  A 15.00 coupon for Suprep was given to the patient.  

## 2019-01-16 ENCOUNTER — Ambulatory Visit: Payer: 59 | Admitting: Physician Assistant

## 2019-01-16 ENCOUNTER — Other Ambulatory Visit: Payer: Self-pay

## 2019-01-16 ENCOUNTER — Encounter: Payer: Self-pay | Admitting: Physician Assistant

## 2019-01-16 VITALS — BP 142/90 | Temp 97.4°F | Wt 167.0 lb

## 2019-01-16 DIAGNOSIS — Z794 Long term (current) use of insulin: Secondary | ICD-10-CM

## 2019-01-16 DIAGNOSIS — I10 Essential (primary) hypertension: Secondary | ICD-10-CM | POA: Diagnosis not present

## 2019-01-16 DIAGNOSIS — F988 Other specified behavioral and emotional disorders with onset usually occurring in childhood and adolescence: Secondary | ICD-10-CM

## 2019-01-16 DIAGNOSIS — E1165 Type 2 diabetes mellitus with hyperglycemia: Secondary | ICD-10-CM | POA: Diagnosis not present

## 2019-01-16 DIAGNOSIS — Z23 Encounter for immunization: Secondary | ICD-10-CM

## 2019-01-16 DIAGNOSIS — E785 Hyperlipidemia, unspecified: Secondary | ICD-10-CM

## 2019-01-16 DIAGNOSIS — E559 Vitamin D deficiency, unspecified: Secondary | ICD-10-CM

## 2019-01-16 DIAGNOSIS — E1169 Type 2 diabetes mellitus with other specified complication: Secondary | ICD-10-CM | POA: Diagnosis not present

## 2019-01-16 DIAGNOSIS — Z79899 Other long term (current) drug therapy: Secondary | ICD-10-CM | POA: Diagnosis not present

## 2019-01-16 DIAGNOSIS — H93A2 Pulsatile tinnitus, left ear: Secondary | ICD-10-CM

## 2019-01-16 NOTE — Patient Instructions (Addendum)
Increase the losartan to 100mg  or one whole pill and monitor your sugars.  Testing/Procedures: HOW TO TAKE YOUR BLOOD PRESSURE:  Rest 5 minutes before taking your blood pressure.  Don't smoke or drink caffeinated beverages for at least 30 minutes before.  Take your blood pressure before (not after) you eat.  Sit comfortably with your back supported and both feet on the floor (don't cross your legs).  Elevate your arm to heart level on a table or a desk.  Use the proper sized cuff. It should fit smoothly and snugly around your bare upper arm. There should be enough room to slip a fingertip under the cuff. The bottom edge of the cuff should be 1 inch above the crease of the elbow.  Somogyi effect The brain needs two things: oxygen and sugar. If the blood sugar level drops too low in the early morning hours, hormones (such as growth hormone, cortisol, and catecholamines) are released to make sure you brain can still function. These help reverse the low blood sugar level but may lead to blood sugar levels that are higher than normal in the morning. This is common for patient that take insulin at night or do not ear regular snacks.  Please schedule to get up in the middle of the night to check your blood sugar.   This may be happening to you. Please eat a high protein night time snack and we would have to decrease your night time sugars.   General eating tips  What to Avoid . Avoid added sugars o Often added sugar can be found in processed foods such as many condiments, dry cereals, cakes, cookies, chips, crisps, crackers, candies, sweetened drinks, etc.  o Read labels and AVOID/DECREASE use of foods with the following in their ingredient list: Sugar, fructose, high fructose corn syrup, sucrose, glucose, maltose, dextrose, molasses, cane sugar, [place] sugar, any type of syrup, agave nectar, etc.   . Avoid snacking in between meals- drink water or if you feel you need a snack, pick a high water  content snack such as cucumbers, watermelon, or any veggie.  Marland Kitchen Avoid foods made with flour o If you are going to eat food made with flour, choose those made with whole-grains; and, minimize your consumption as much as is tolerable . Avoid processed foods o These foods are generally stocked in the middle of the grocery store.  o Focus on shopping on the perimeter of the grocery.  What to Include . Vegetables o GREEN LEAFY VEGETABLES: Kale, spinach, mustard greens, collard greens, cabbage, broccoli, etc. o OTHER: Asparagus, cauliflower, eggplant, carrots, peas, Brussel sprouts, tomatoes, bell peppers, zucchini, beets, cucumbers, etc. . Grains, seeds, and legumes o Beans: kidney beans, black eyed peas, garbanzo beans, black beans, pinto beans, etc. o Whole, unrefined grains: [place] rice, barley, bulgur, oatmeal, etc. . Healthy fats  o Avoid highly processed fats such as vegetable oil o Examples of healthy fats: avocado, olives, virgin olive oil, dark chocolate (?72% Cocoa), nuts (peanuts, almonds, walnuts, cashews, pecans, etc.) o Please still do small amount of these healthy fats, they are dense in calories.  . Low - Moderate Intake of Animal Sources of Protein o Meat sources: chicken, [place], salmon, tuna. Limit to 4 ounces of meat at one time or the size of your palm. o Consider limiting dairy sources, but when choosing dairy focus on: PLAIN [place] yogurt, cottage cheese, high-protein milk . Fruit o Choose berries     General Headache Without Cause A headache is  pain or discomfort felt around the head or neck area. The specific cause of a headache may not be found. There are many causes and types of headaches. A few common ones are:  Tension headaches.  Migraine headaches.  Cluster headaches.  Chronic daily headaches. Follow these instructions at home: Watch your condition for any changes. Let your health care provider know about them. Take these steps to help with your  condition: Managing pain      Take over-the-counter and prescription medicines only as told by your health care provider.  Lie down in a dark, quiet room when you have a headache.  If directed, put ice on your head and neck area: ? Put ice in a plastic bag. ? Place a towel between your skin and the bag. ? Leave the ice on for 20 minutes, 2-3 times per day.  If directed, apply heat to the affected area. Use the heat source that your health care provider recommends, such as a moist heat pack or a heating pad. ? Place a towel between your skin and the heat source. ? Leave the heat on for 20-30 minutes. ? Remove the heat if your skin turns bright red. This is especially important if you are unable to feel pain, heat, or cold. You may have a greater risk of getting burned.  Keep lights dim if bright lights bother you or make your headaches worse. Eating and drinking  Eat meals on a regular schedule.  If you drink alcohol: ? Limit how much you use to:  0-1 drink a day for women.  0-2 drinks a day for men. ? Be aware of how much alcohol is in your drink. In the [place] one drink equals one 12 oz bottle of beer (355 mL), one 5 oz glass of wine (148 mL), or one 1 oz glass of hard liquor (44 mL).  Stop drinking caffeine, or decrease the amount of caffeine you drink. General instructions   Keep a headache journal to help find out what may trigger your headaches. For example, write down: ? What you eat and drink. ? How much sleep you get. ? Any change to your diet or medicines.  Try massage or other relaxation techniques.  Limit stress.  Sit up straight, and do not tense your muscles.  Do not use any products that contain nicotine or tobacco, such as cigarettes, e-cigarettes, and chewing tobacco. If you need help quitting, ask your health care provider.  Exercise regularly as told by your health care provider.  Sleep on a regular schedule. Get 7-9 hours of sleep each night, or  the amount recommended by your health care provider.  Keep all follow-up visits as told by your health care provider. This is important. Contact a health care provider if:  Your symptoms are not helped by medicine.  You have a headache that is different from the usual headache.  You have nausea or you vomit.  You have a fever. Get help right away if:  Your headache becomes severe quickly.  Your headache gets worse after moderate to intense physical activity.  You have repeated vomiting.  You have a stiff neck.  You have a loss of vision.  You have problems with speech.  You have pain in the eye or ear.  You have muscular weakness or loss of muscle control.  You lose your balance or have trouble walking.  You feel faint or pass out.  You have confusion.  You have a seizure. Summary  A headache is pain or discomfort felt around the head or neck area.  There are many causes and types of headaches. In some cases, the cause may not be found.  Keep a headache journal to help find out what may trigger your headaches. Watch your condition for any changes. Let your health care provider know about them.  Contact a health care provider if you have a headache that is different from the usual headache, or if your symptoms are not helped by medicine.  Get help right away if your headache becomes severe, you vomit, you have a loss of vision, you lose your balance, or you have a seizure. This information is not intended to replace advice given to you by your health care provider. Make sure you discuss any questions you have with your health care provider. Document Released: 03/23/2005 Document Revised: 10/11/2017 Document Reviewed: 10/11/2017 Elsevier Patient Education  2020 [employer].

## 2019-01-17 ENCOUNTER — Encounter: Payer: Self-pay | Admitting: Internal Medicine

## 2019-01-17 LAB — LIPID PANEL
Cholesterol: 140 mg/dL (ref <200)
HDL: 58 mg/dL (ref >=50)
LDL Cholesterol (Calc): 60 mg/dL (calc)
Non-HDL Cholesterol (Calc): 82 mg/dL (calc) (ref <130)
Total CHOL/HDL Ratio: 2.4 (calc) (ref <5.0)
Triglycerides: 136 mg/dL (ref <150)

## 2019-01-17 LAB — CBC WITH DIFFERENTIAL/PLATELET
Absolute Monocytes: 505 cells/uL (ref 200–950)
Basophils Absolute: 131 cells/uL (ref 0–200)
Basophils Relative: 1.5 %
Eosinophils Absolute: 322 cells/uL (ref 15–500)
Eosinophils Relative: 3.7 %
HCT: 42.7 % (ref 35.0–45.0)
Hemoglobin: 13.9 g/dL (ref 11.7–15.5)
Lymphs Abs: 2575 cells/uL (ref 850–3900)
MCH: 27.5 pg (ref 27.0–33.0)
MCHC: 32.6 g/dL (ref 32.0–36.0)
MCV: 84.6 fL (ref 80.0–100.0)
MPV: 10.7 fL (ref 7.5–12.5)
Monocytes Relative: 5.8 %
Neutro Abs: 5168 cells/uL (ref 1500–7800)
Neutrophils Relative %: 59.4 %
Platelets: 383 10*3/uL (ref 140–400)
RBC: 5.05 10*6/uL (ref 3.80–5.10)
RDW: 12.6 % (ref 11.0–15.0)
Total Lymphocyte: 29.6 %
WBC: 8.7 10*3/uL (ref 3.8–10.8)

## 2019-01-17 LAB — COMPLETE METABOLIC PANEL WITH GFR
AG Ratio: 1.5 (calc) (ref 1.0–2.5)
ALT: 18 U/L (ref 6–29)
AST: 17 U/L (ref 10–35)
Albumin: 4.2 g/dL (ref 3.6–5.1)
Alkaline phosphatase (APISO): 74 U/L (ref 37–153)
BUN: 15 mg/dL (ref 7–25)
CO2: 25 mmol/L (ref 20–32)
Calcium: 10 mg/dL (ref 8.6–10.4)
Chloride: 102 mmol/L (ref 98–110)
Creat: 0.77 mg/dL (ref 0.50–0.99)
GFR, Est African American: 96 mL/min/{1.73_m2} (ref >=60)
GFR, Est Non African American: 83 mL/min/{1.73_m2} (ref >=60)
Globulin: 2.8 g/dL (calc) (ref 1.9–3.7)
Glucose, Bld: 278 mg/dL — ABNORMAL HIGH (ref 65–99)
Potassium: 4.2 mmol/L (ref 3.5–5.3)
Sodium: 138 mmol/L (ref 135–146)
Total Bilirubin: 0.5 mg/dL (ref 0.2–1.2)
Total Protein: 7 g/dL (ref 6.1–8.1)

## 2019-01-17 LAB — HEMOGLOBIN A1C
Hgb A1c MFr Bld: 8.6 % of total Hgb — ABNORMAL HIGH (ref <5.7)
Mean Plasma Glucose: 200 (calc)
eAG (mmol/L): 11.1 (calc)

## 2019-01-17 LAB — TSH: TSH: 3.74 mIU/L (ref 0.40–4.50)

## 2019-01-17 LAB — VITAMIN D 25 HYDROXY (VIT D DEFICIENCY, FRACTURES): Vit D, 25-Hydroxy: 77 ng/mL (ref 30–100)

## 2019-01-17 LAB — MAGNESIUM: Magnesium: 1.5 mg/dL (ref 1.5–2.5)

## 2019-01-25 ENCOUNTER — Telehealth: Payer: Self-pay | Admitting: Internal Medicine

## 2019-01-25 NOTE — Telephone Encounter (Signed)

## 2019-01-26 ENCOUNTER — Ambulatory Visit (AMBULATORY_SURGERY_CENTER): Payer: 59 | Admitting: Internal Medicine

## 2019-01-26 ENCOUNTER — Encounter: Payer: Self-pay | Admitting: Internal Medicine

## 2019-01-26 ENCOUNTER — Other Ambulatory Visit: Payer: Self-pay | Admitting: Physician Assistant

## 2019-01-26 ENCOUNTER — Other Ambulatory Visit: Payer: Self-pay

## 2019-01-26 VITALS — BP 96/53 | HR 76 | Temp 98.7°F | Resp 12 | Ht 64.0 in | Wt 167.0 lb

## 2019-01-26 DIAGNOSIS — Z1509 Genetic susceptibility to other malignant neoplasm: Secondary | ICD-10-CM

## 2019-01-26 DIAGNOSIS — D122 Benign neoplasm of ascending colon: Secondary | ICD-10-CM | POA: Diagnosis not present

## 2019-01-26 DIAGNOSIS — K621 Rectal polyp: Secondary | ICD-10-CM | POA: Diagnosis not present

## 2019-01-26 DIAGNOSIS — D128 Benign neoplasm of rectum: Secondary | ICD-10-CM

## 2019-01-26 DIAGNOSIS — Z86010 Personal history of colonic polyps: Secondary | ICD-10-CM

## 2019-01-26 MED ORDER — SODIUM CHLORIDE 0.9 % IV SOLN: 500.0000 mL | Freq: Once | INTRAVENOUS | Status: DC

## 2019-01-26 NOTE — Progress Notes (Signed)
Report given to PACU, vss 

## 2019-01-26 NOTE — Op Note (Signed)
[place] Patient Name: Sydney Dickson Procedure Date: 01/26/2019 7:57 AM MRN: [id number] Endoscopist: Jerene Bears , MD Age: 63 Referring MD:  Date of Birth: 1955/04/20 Gender: Female Account #: [id number] Procedure:                Colonoscopy Indications:              High risk colon cancer surveillance: Personal                            history of hereditary nonpolyposis colorectal                            cancer (Lynch Syndrome), Last colonoscopy: 2019 Medicines:                Monitored Anesthesia Care Procedure:                Pre-Anesthesia Assessment:                           - Prior to the procedure, a History and Physical                            was performed, and patient medications and                            allergies were reviewed. The patient's tolerance of                            previous anesthesia was also reviewed. The risks                            and benefits of the procedure and the sedation                            options and risks were discussed with the patient.                            All questions were answered, and informed consent                            was obtained. Prior Anticoagulants: The patient has                            taken no previous anticoagulant or antiplatelet                            agents. ASA Grade Assessment: II - A patient with                            mild systemic disease. After reviewing the risks                            and benefits, the patient was deemed in  satisfactory condition to undergo the procedure.                           After obtaining informed consent, the colonoscope                            was passed under direct vision. Throughout the                            procedure, the patient's blood pressure, pulse, and                            oxygen saturations were monitored continuously. The                            Colonoscope was  introduced through the anus and                            advanced to the cecum, identified by appendiceal                            orifice and ileocecal valve. The colonoscopy was                            performed without difficulty. The patient tolerated                            the procedure well. The quality of the bowel                            preparation was good. The ileocecal valve,                            appendiceal orifice, and rectum were photographed. Scope In: 8:04:20 AM Scope Out: 8:27:36 AM Scope Withdrawal Time: 0 hours 18 minutes 20 seconds  Total Procedure Duration: 0 hours 23 minutes 16 seconds  Findings:                 The digital rectal exam was normal.                           A 2 mm polyp was found in the ascending colon. The                            polyp was sessile. The polyp was removed with a                            cold snare. Resection and retrieval were complete.                           A 3 mm polyp was found in the rectum. The polyp was                            sessile.  The polyp was removed with a cold biopsy                            forceps. Resection and retrieval were complete.                           The exam was otherwise without abnormality. Complications:            No immediate complications. Estimated Blood Loss:     Estimated blood loss was minimal. Impression:               - One 2 mm polyp in the ascending colon, removed                            with a cold snare. Resected and retrieved.                           - One 3 mm polyp in the rectum just proximal to the                            dentate line, removed with a cold biopsy forceps.                            Resected and retrieved.                           - The examination was otherwise normal. Recommendation:           - Patient has a contact number available for                            emergencies. The signs and symptoms of potential                             delayed complications were discussed with the                            patient. Return to normal activities tomorrow.                            Written discharge instructions were provided to the                            patient.                           - Resume previous diet.                           - Continue present medications.                           - Await pathology results.                           - Repeat colonoscopy in 1 year for surveillance.  Jerene Bears, MD 01/26/2019 8:31:58 AM This report has been signed electronically.

## 2019-01-26 NOTE — Patient Instructions (Signed)
HANDOUTS PROVIDED ON: POLYPS  THE POLYPS REMOVED TODAY HAVE BEEN SENT FOR PATHOLOGY.  THE RESULTS WILL TAKE 2-3 WEEKS TO RECEIVE.  IT IS RECOMMENDED YOU HAVE A REPEAT COLONOSCOPY IN 1 YEAR.  YOU MAY RESUME YOUR PREVIOUS DIET AND MEDICATION SCHEDULE TODAY.  [place] YOU FOR ALLOWING [place] TO CARE FOR YOU TODAY!!  YOU HAD AN ENDOSCOPIC PROCEDURE TODAY AT [facility]:   Refer to the procedure report that was given to you for any specific questions about what was found during the examination.  If the procedure report does not answer your questions, please call your gastroenterologist to clarify.  If you requested that your care partner not be given the details of your procedure findings, then the procedure report has been included in a sealed envelope for you to review at your convenience later.  YOU SHOULD EXPECT: Some feelings of bloating in the abdomen. Passage of more gas than usual.  Walking can help get rid of the air that was put into your GI tract during the procedure and reduce the bloating. If you had a lower endoscopy (such as a colonoscopy or flexible sigmoidoscopy) you may notice spotting of blood in your stool or on the toilet paper. If you underwent a bowel prep for your procedure, you may not have a normal bowel movement for a few days.  Please Note:  You might notice some irritation and congestion in your nose or some drainage.  This is from the oxygen used during your procedure.  There is no need for concern and it should clear up in a day or so.  SYMPTOMS TO REPORT IMMEDIATELY:   Following lower endoscopy (colonoscopy or flexible sigmoidoscopy):  Excessive amounts of blood in the stool  Significant tenderness or worsening of abdominal pains  Swelling of the abdomen that is new, acute  Fever of 100F or higher  For urgent or emergent issues, a gastroenterologist can be reached at any hour by calling [phone].   DIET:  We do recommend a small meal at first,  but then you may proceed to your regular diet.  Drink plenty of fluids but you should avoid alcoholic beverages for 24 hours.  ACTIVITY:  You should plan to take it easy for the rest of today and you should NOT DRIVE or use heavy machinery until tomorrow (because of the sedation medicines used during the test).    FOLLOW UP: Our staff will call the number listed on your records 48-72 hours following your procedure to check on you and address any questions or concerns that you may have regarding the information given to you following your procedure. If we do not reach you, we will leave a message.  We will attempt to reach you two times.  During this call, we will ask if you have developed any symptoms of COVID 19. If you develop any symptoms (ie: fever, flu-like symptoms, shortness of breath, cough etc.) before then, please call [phone].  If you test positive for Covid 19 in the 2 weeks post procedure, please call and report this information to [place].    If any biopsies were taken you will be contacted by phone or by letter within the next 1-3 weeks.  Please call us at [phone] if you have not heard about the biopsies in 3 weeks.    SIGNATURES/CONFIDENTIALITY: You and/or your care partner have signed paperwork which will be entered into your electronic medical record.  These signatures attest to the fact that that  the information above on your After Visit Summary has been reviewed and is understood.  Full responsibility of the confidentiality of this discharge information lies with you and/or your care-partner.

## 2019-01-26 NOTE — Progress Notes (Signed)
Pt's states no medical or surgical changes since previsit or office visit.  Temp taken by JB VS taken by CW 

## 2019-01-26 NOTE — Progress Notes (Signed)
Called to room to assist during endoscopic procedure.  Patient ID and intended procedure confirmed with present staff. Received instructions for my participation in the procedure from the performing physician.  

## 2019-01-30 ENCOUNTER — Telehealth: Payer: Self-pay

## 2019-01-30 NOTE — Telephone Encounter (Signed)
  Follow up Call-  Call back number 01/26/2019 05/21/2017  Post procedure Call Back phone  # [phone]  Permission to leave phone message Yes Yes  Some recent data might be hidden     Left message

## 2019-01-30 NOTE — Telephone Encounter (Signed)
Follow up call attempted.  NALM  

## 2019-02-02 ENCOUNTER — Encounter: Payer: Self-pay | Admitting: Internal Medicine

## 2019-02-02 ENCOUNTER — Other Ambulatory Visit: Payer: Self-pay | Admitting: Internal Medicine

## 2019-02-02 DIAGNOSIS — Z79899 Other long term (current) drug therapy: Secondary | ICD-10-CM

## 2019-02-09 ENCOUNTER — Telehealth: Payer: Self-pay | Admitting: Physician Assistant

## 2019-02-09 DIAGNOSIS — F988 Other specified behavioral and emotional disorders with onset usually occurring in childhood and adolescence: Secondary | ICD-10-CM

## 2019-02-09 MED ORDER — LISDEXAMFETAMINE DIMESYLATE 30 MG PO CAPS: 30.0000 mg | ORAL_CAPSULE | ORAL | 0 refills | Status: DC

## 2019-02-09 NOTE — Telephone Encounter (Signed)
-----   Message from Elenor Quinones, [place] sent at 02/09/2019 11:10 AM EST ----- Regarding: MED REFILL Contact: [phone] PER PT/YELLOW NOTE:  Refill on VYVANSE Please & thank you!  Pharmacy:  [facility]

## 2019-03-23 ENCOUNTER — Other Ambulatory Visit: Payer: Self-pay

## 2019-03-23 ENCOUNTER — Telehealth: Payer: Self-pay | Admitting: Physician Assistant

## 2019-03-23 DIAGNOSIS — E1165 Type 2 diabetes mellitus with hyperglycemia: Secondary | ICD-10-CM

## 2019-03-23 DIAGNOSIS — Z794 Long term (current) use of insulin: Secondary | ICD-10-CM

## 2019-03-23 MED ORDER — ALPRAZOLAM 0.5 MG PO TABS: 0.5000 mg | ORAL_TABLET | Freq: Two times a day (BID) | ORAL | 0 refills | Status: DC | PRN

## 2019-03-23 MED ORDER — "INSULIN SYRINGE-NEEDLE U-100 31G X 5/16"" 1 ML MISC": 2 refills | Status: DC

## 2019-03-23 NOTE — Telephone Encounter (Signed)
-----   Message from Elenor Quinones, [place] sent at 03/23/2019  3:05 PM EST ----- Regarding: med refill office note Contact: [phone] Per pt/Yellow note:   Refill on XANAX Please & Thank You!   FYI: [facility]

## 2019-03-27 ENCOUNTER — Other Ambulatory Visit: Payer: Self-pay | Admitting: Physician Assistant

## 2019-04-16 ENCOUNTER — Other Ambulatory Visit: Payer: Self-pay | Admitting: Internal Medicine

## 2019-04-16 DIAGNOSIS — F330 Major depressive disorder, recurrent, mild: Secondary | ICD-10-CM

## 2019-04-25 ENCOUNTER — Other Ambulatory Visit: Payer: Self-pay | Admitting: Internal Medicine

## 2019-04-25 DIAGNOSIS — Z79899 Other long term (current) drug therapy: Secondary | ICD-10-CM

## 2019-05-29 ENCOUNTER — Other Ambulatory Visit: Payer: Self-pay | Admitting: Internal Medicine

## 2019-05-29 DIAGNOSIS — F988 Other specified behavioral and emotional disorders with onset usually occurring in childhood and adolescence: Secondary | ICD-10-CM

## 2019-05-29 MED ORDER — LISDEXAMFETAMINE DIMESYLATE 30 MG PO CAPS: 30.0000 mg | ORAL_CAPSULE | ORAL | 0 refills | Status: DC

## 2019-06-16 NOTE — Progress Notes (Signed)
Complete Physical  Assessment and Plan: Essential hypertension - continue medications, DASH diet, exercise and monitor at home. Call if greater than 130/80.  -     CBC with Differential/Platelet -     BASIC METABOLIC PANEL WITH GFR -     Hepatic function panel -     TSH -     Urinalysis, Routine w reflex microscopic -     Microalbumin / creatinine urine ratio -     EKG 12-Lead  Lynch syndrome -     Continue follow up GYN/GI  Diabetes mellitus with hyperlipidemia ([place]) Discussed general issues about diabetes pathophysiology and management., Educational material distributed., Suggested low cholesterol diet., Encouraged aerobic exercise., Discussed foot care., Reminded to get yearly retinal exam.  Hyperlipidemia, unspecified hyperlipidemia type -continue medications, check lipids, decrease fatty foods, increase activity.  -     Lipid panel  Medication management -     Magnesium  Vitamin D deficiency Continue supplement  Varicose veins of both lower extremities, unspecified whether complicated - weight loss discussed, continue compression stockings and elevation  Attention deficit disorder, unspecified hyperactivity presence vyvanse refilled- wants to decrease to 20 mg to see if she can nap during the day  Class 1 obesity due to excess calories with serious comorbidity and body mass index (BMI) of 31.0 to 31.9 in adult - follow up 3 months for progress monitoring - increase veggies, decrease carbs - long discussion about weight loss, diet, and exercise  Hyperlipidemia associated with type 2 diabetes mellitus ([place]) -     Lipid panel   Type 2 diabetes mellitus with hyperglycemia, with long-term current use of insulin (HCC) -     Hemoglobin A1c -     EKG 12-Lead Cough -     DG Chest 2 View; Future -     ipratropium (ATROVENT) 0.06 % nasal spray; Place 2 sprays into the nose 3 (three) times daily. 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 Screening labs and tests as requested with regular follow-up as recommended. Over 40 minutes of exam, counseling, chart review, and complex, high level critical decision making was performed this visit.   Future Appointments  Date Time Provider [place]  06/19/2019  2:00 PM Vicie Mutters, PA-C [facility] None  06/18/2020  2:00 PM Vicie Mutters, PA-C [facility] None    HPI  64 y.o. female  presents for a complete physical.  Bilateral hip pain x months, lower back bilateral. Declines referral to PT or ortho. Bilateral SI No pain down her legs, no weakness in her legs.   BMI is Body mass index is 28.69 kg/m., she is working on diet and exercise. Wt Readings from Last 3 Encounters:  06/19/19 172 lb 6.4 oz (78.2 kg)  01/26/19 167 lb (75.8 kg)  01/16/19 167 lb (75.8 kg)   Her blood pressure has been controlled at home, today their BP is BP: 120/90 She does workout. She denies chest  pain, shortness of breath, dizziness.   She has had a cough x 1 year, worse with walking sometimes with SOB. No wheezing but has constant drainage, she is on xyzal and flonase daily. No GERD symptoms.   She has been working on diet and exercise for diabetes  With hyperlipidemia on crestor daily and at goal  she is on bASA With CKD she is on ACE/ARB She is on 70/30 insulin twice a day on 15 and 10 units.  Metformin 750 mg BID she checks her sugars with livongo- can not do continuous monitoring due to adhesive.  No low sugars.  Sugars  140-160- not drinking sweet tea  denies paresthesia of the feet, polydipsia, polyuria and visual disturbances. Last A1C in the office was:  Lab Results  Component Value Date   HGBA1C 8.6 (H) 01/16/2019   Lab Results  Component Value Date   CHOL 140 01/16/2019   HDL 58 01/16/2019   LDLCALC 60 01/16/2019   TRIG 136 01/16/2019   CHOLHDL 2.4 01/16/2019    Lab Results  Component Value Date   GFRNONAA 83 01/16/2019   Patient is on Vitamin D supplement.   Lab Results  Component Value Date   VD25OH 77 01/16/2019     She has anxiety/depression, is on xanax PRN and lexapro She has ADD and is on Vyvanse.  Patient has history of lynch syndrome, follows with Dr. Hilarie Fredrickson, had partial prioctectomy with Dr. Johney Maine on 07/19/2014. They have also advised increase risk of other cancers including endometrial and ovarian cancer, she has not had a TAH due to recent cancer diagnosis in her husband.    Current Medications:    Current Outpatient Medications (Endocrine & Metabolic):  .  insulin NPH-regular Human (NOVOLIN 70/30) (70-30) 100 UNIT/ML injection, INJECT 25 UNITS IN THE AM AND 20 UNITS AT DINNER .  metFORMIN (GLUCOPHAGE-XR) 750 MG 24 hr tablet, Take 1 tablet 2 x /day with Meals for Diabetes  Current Outpatient Medications (Cardiovascular):  .  losartan (COZAAR) 100 MG tablet, Take 1 tablet Daily for BP & Diabetic Kidney Protection .  rosuvastatin (CRESTOR) 20 MG tablet, Take 1 tablet daily for Cholesterol  Current Outpatient Medications (Respiratory):  .  albuterol (PROVENTIL HFA;VENTOLIN HFA) 108 (90 Base) MCG/ACT inhaler,  Use 1 to 2 Inhalations 15 minutes apart every 4 hours to Rescue Asthma (Patient taking differently: as needed. ) .  fluticasone (FLONASE) 50 MCG/ACT nasal spray, Place 2 sprays into both nostrils daily. Marland Kitchen  levocetirizine (XYZAL) 5 MG tablet, Take 1 tablet Daily for Allergies  Current Outpatient Medications (Analgesics):  .  acetaminophen (TYLENOL) 500 MG tablet, Take  1,000 mg by mouth daily as needed for moderate pain or headache. Marland Kitchen  aspirin 81 MG tablet, Take 81 mg by mouth daily. Marland Kitchen  ibuprofen (ADVIL,MOTRIN) 800 MG tablet, Take 1 tablet (800 mg total) by mouth every 8 (eight) hours as needed. .  traMADol (ULTRAM) 50 MG tablet, Take 1 tablet (50 mg total) by mouth every 6 (six) hours as needed.   Current Outpatient Medications (Other):  Marland Kitchen  ALPRAZolam (XANAX) 0.5 MG tablet, Take 1 tablet (0.5 mg total) by mouth 2 (two) times daily as needed. Marland Kitchen  amitriptyline (ELAVIL) 50 MG tablet, Take 1 tablet at Bedtime as needed for Sleep .  CHOLECALCIFEROL PO, Take 5,000 Units by mouth daily. Marland Kitchen  escitalopram (LEXAPRO) 20 MG tablet, Take 1 tablet Daily for Mood .  Insulin Syringe-Needle U-100 (BD INSULIN SYRINGE U/F) 31G X 5/16" 1 ML MISC, Use  twice a day .  lisdexamfetamine (VYVANSE) 30 MG capsule, Take 1 capsule (30 mg total) by mouth every morning. Marland Kitchen  olopatadine (PATANOL) 0.1 % ophthalmic solution, PLACE 1 DROP INTO BOTH EYES 2 (TWO) TIMES DAILY. Marland Kitchen  tiZANidine (ZANAFLEX) 2 MG tablet, TAKE 1 TABLET (2 MG TOTAL) BY MOUTH EVERY 6 (SIX) HOURS AS NEEDED FOR MUSCLE SPASMS.  Health Maintenance:   Immunization History  Administered Date(s) Administered  . Influenza Inj Mdck Quad With Preservative 01/22/2017, 12/30/2017, 01/16/2019  . Influenza Split 03/08/2014, 01/17/2015  . Influenza, Seasonal, Injecte, Preservative Fre 01/08/2016  . PPD Test 12/28/2013  . Pneumococcal Polysaccharide-23 04/22/2015  . Td 10/21/2005  . Tdap 08/03/2013   Tetanus: 2015 Pneumovax: 2017 Prevnar 13:  Age 75 Flu vaccine: 2020 Zostavax: declines  No LMP recorded (lmp unknown). Patient is postmenopausal. Pap: 09/2017- may get hysterectomy MGM: 09/2018 DEXA: Colonoscopy: 01/2019 yearly EGD: 05/2017 Stress test 2007 CXR 2009 Eye exam 09/28/2018 no retinopathy  Patient Care Team: Unk Pinto, MD as PCP - General (Internal Medicine) Richmond Campbell, MD as Consulting Physician  (Gastroenterology) Jannette Spanner, MD as Referring Physician (Dermatology) Rachel Moulds, DO (Internal Medicine) Garald Balding, MD as Consulting Physician (Orthopedic Surgery) Rutherford Guys, MD as Consulting Physician (Ophthalmology) Stanford Breed Denice Bors, MD as Consulting Physician (Cardiology) Michael Boston, MD as Consulting Physician (General Surgery) Pyrtle, Lajuan Lines, MD as Consulting Physician (Gastroenterology)  Medical History:  Past Medical History:  Diagnosis Date  . ADD (attention deficit disorder)   . Allergy   . Anxiety   . Arthritis   . Asthma    with severe allergies pt does use albuterol inhaler when needed  . Cataract   . Depression   . Diabetes mellitus without complication ([place]) 1751   TYPE 2   . GERD (gastroesophageal reflux disease)    hx of had Nissen Fundoplication 0258 has not had any issues since  . History of hiatal hernia   . Hyperlipidemia 2004  . Hypertension   . IBS (irritable bowel syndrome)   . Lynch syndrome   . Tubular adenoma of colon    Allergies Allergies  Allergen Reactions  . Tape Rash  . Ace Inhibitors Cough  . Metformin And Related     myalgia  . Morphine And Related Itching  . Penicillins     Pt states that in 3rd grade she was given an injection in the buttock and reacted with a large hive in that area Pt can take oral penicillin with no reaction    . Wellbutrin [Bupropion]     Felt funny    SURGICAL HISTORY She  has a past surgical history that includes Hernia repair; Cholecystectomy; Tubal ligation; Cosmetic surgery; Rotator cuff repair (Right, 2012); Eye surgery (2006); Nissen fundoplication (5277); Carpal tunnel release (1998, 2001); Nasal sinus surgery; colonscopy ; Upper gi endoscopy; and Partial proctectomy by tem (N/A, 07/19/2014).   FAMILY HISTORY Her family history includes Breast cancer in her mother and sister; Cancer in her mother and paternal grandmother; Colon cancer (age of onset: 39) in her brother; Colon  cancer (age of onset: 64) in her paternal grandfather; Colon cancer (age of onset: 81) in her father; Diabetes in her mother; Hyperlipidemia in her father and mother; Hypertension in her father and mother; Prostate cancer in her father; Skin cancer in her father.   SOCIAL HISTORY She  reports that she has never smoked. She has never used smokeless tobacco. She reports that she does not drink alcohol or use drugs.  Review  of Systems: Review of Systems  Constitutional: Negative.   HENT: Negative.   Eyes: Negative.   Respiratory: Negative.   Cardiovascular: Negative.   Gastrointestinal: Negative.   Skin: Negative.   Neurological: Negative.   Endo/Heme/Allergies: Negative.   Psychiatric/Behavioral: Negative.     Physical Exam: Estimated body mass index is 28.69 kg/m as calculated from the following:   Height as of this encounter: '5\' 5"'  (1.651 m).   Weight as of this encounter: 172 lb 6.4 oz (78.2 kg). BP 120/90   Pulse 96   Temp (!) 97.5 F (36.4 C)   Ht '5\' 5"'  (1.651 m)   Wt 172 lb 6.4 oz (78.2 kg)   LMP  (LMP Unknown)   SpO2 97%   BMI 28.69 kg/m  General Appearance: Well nourished, in no apparent distress.  Eyes: PERRLA, EOMs, conjunctiva no swelling or erythema, normal fundi and vessels.  Sinuses: No Frontal/maxillary tenderness  ENT/Mouth: Ext aud canals clear, normal light reflex with TMs without erythema, bulging.  No erythema, swelling, or exudate on post pharynx. Tonsils not swollen or erythematous. Hearing normal.  Neck: Supple, thyroid normal. No bruits  Respiratory: Respiratory effort normal, BS equal bilaterally without rales, rhonchi, wheezing or stridor.  Cardio: RRR without murmurs, rubs or gallops. Brisk peripheral pulses without edema.  Chest: symmetric, with normal excursions and percussion.  Breasts: Symmetric, without lumps, nipple discharge, retractions.  Abdomen: Soft, + epigastric tenderness, no guarding, rebound, hernias, masses, or organomegaly.   Lymphatics: Non tender without lymphadenopathy.  Genitourinary: defer Musculoskeletal: Full ROM all peripheral extremities,5/5 strength, and normal gait.  Skin: very mild erythematous rash along right flank. Warm, dry without rashes, lesions, ecchymosis. Neuro: Cranial nerves intact, reflexes equal bilaterally. Normal muscle tone, no cerebellar symptoms. Sensation intact.  Psych: Awake and oriented X 3, normal affect, Insight and Judgment appropriate.   EKG: WNL AORTA SCAN: defer  Vicie Mutters 1:58 PM [facility]

## 2019-06-19 ENCOUNTER — Ambulatory Visit: Payer: 59 | Admitting: Physician Assistant

## 2019-06-19 ENCOUNTER — Ambulatory Visit
Admission: RE | Admit: 2019-06-19 | Discharge: 2019-06-19 | Disposition: A | Discharge status: Home / Self Care | Payer: 59 | Source: Ambulatory Visit | Attending: Physician Assistant | Admitting: Physician Assistant

## 2019-06-19 ENCOUNTER — Encounter: Payer: Self-pay | Admitting: Physician Assistant

## 2019-06-19 ENCOUNTER — Other Ambulatory Visit: Payer: Self-pay

## 2019-06-19 VITALS — BP 120/90 | HR 96 | Temp 97.5°F | Ht 65.0 in | Wt 172.4 lb

## 2019-06-19 DIAGNOSIS — Z1389 Encounter for screening for other disorder: Secondary | ICD-10-CM

## 2019-06-19 DIAGNOSIS — Z130 Encounter for screening for diseases of the blood and blood-forming organs and certain disorders involving the immune mechanism: Secondary | ICD-10-CM | POA: Diagnosis not present

## 2019-06-19 DIAGNOSIS — Z0001 Encounter for general adult medical examination with abnormal findings: Secondary | ICD-10-CM

## 2019-06-19 DIAGNOSIS — Z13220 Encounter for screening for lipoid disorders: Secondary | ICD-10-CM

## 2019-06-19 DIAGNOSIS — Z1507 Genetic susceptibility to malignant neoplasm of urinary tract: Secondary | ICD-10-CM

## 2019-06-19 DIAGNOSIS — H04129 Dry eye syndrome of unspecified lacrimal gland: Secondary | ICD-10-CM

## 2019-06-19 DIAGNOSIS — I8393 Asymptomatic varicose veins of bilateral lower extremities: Secondary | ICD-10-CM

## 2019-06-19 DIAGNOSIS — M533 Sacrococcygeal disorders, not elsewhere classified: Secondary | ICD-10-CM

## 2019-06-19 DIAGNOSIS — Z794 Long term (current) use of insulin: Secondary | ICD-10-CM

## 2019-06-19 DIAGNOSIS — E559 Vitamin D deficiency, unspecified: Secondary | ICD-10-CM | POA: Diagnosis not present

## 2019-06-19 DIAGNOSIS — Z131 Encounter for screening for diabetes mellitus: Secondary | ICD-10-CM

## 2019-06-19 DIAGNOSIS — Z0000 Encounter for general adult medical examination without abnormal findings: Secondary | ICD-10-CM

## 2019-06-19 DIAGNOSIS — Z136 Encounter for screening for cardiovascular disorders: Secondary | ICD-10-CM

## 2019-06-19 DIAGNOSIS — F988 Other specified behavioral and emotional disorders with onset usually occurring in childhood and adolescence: Secondary | ICD-10-CM

## 2019-06-19 DIAGNOSIS — I10 Essential (primary) hypertension: Secondary | ICD-10-CM | POA: Diagnosis not present

## 2019-06-19 DIAGNOSIS — E1169 Type 2 diabetes mellitus with other specified complication: Secondary | ICD-10-CM

## 2019-06-19 DIAGNOSIS — Z79899 Other long term (current) drug therapy: Secondary | ICD-10-CM

## 2019-06-19 DIAGNOSIS — R05 Cough: Secondary | ICD-10-CM

## 2019-06-19 DIAGNOSIS — E785 Hyperlipidemia, unspecified: Secondary | ICD-10-CM

## 2019-06-19 DIAGNOSIS — D128 Benign neoplasm of rectum: Secondary | ICD-10-CM

## 2019-06-19 DIAGNOSIS — R059 Cough, unspecified: Secondary | ICD-10-CM

## 2019-06-19 DIAGNOSIS — E663 Overweight: Secondary | ICD-10-CM

## 2019-06-19 DIAGNOSIS — E1165 Type 2 diabetes mellitus with hyperglycemia: Secondary | ICD-10-CM

## 2019-06-19 DIAGNOSIS — Z1509 Genetic susceptibility to other malignant neoplasm: Secondary | ICD-10-CM

## 2019-06-19 DIAGNOSIS — G5762 Lesion of plantar nerve, left lower limb: Secondary | ICD-10-CM

## 2019-06-19 MED ORDER — IPRATROPIUM BROMIDE 0.06 % NA SOLN: 2.0000 | Freq: Three times a day (TID) | NASAL | 2 refills | Status: DC

## 2019-06-19 MED ORDER — LISDEXAMFETAMINE DIMESYLATE 20 MG PO CAPS: 20.0000 mg | ORAL_CAPSULE | Freq: Every day | ORAL | 0 refills | Status: DC

## 2019-06-19 NOTE — Patient Instructions (Addendum)
INFORMATION ABOUT YOUR XRAY  Can walk into [address] for an Insurance account manager. They will have the order and take you back. You do not any paper work, I should get the result back today or tomorrow. This order is good for a year.  Can call [phone] to schedule an appointment if you wish.     Sacroiliac Joint Dysfunction  Sacroiliac joint dysfunction is a condition that causes inflammation on one or both sides of the sacroiliac (SI) joint. The SI joint connects the lower part of the spine (sacrum) with the two upper portions of the pelvis (ilium). This condition causes deep aching or burning pain in the low back. In some cases, the pain may also spread into one or both buttocks, hips, or thighs. What are the causes? This condition may be caused by:  Pregnancy. During pregnancy, extra stress is put on the SI joints because the pelvis widens.  Injury, such as: ? Injuries from car accidents. ? Sports-related injuries. ? Work-related injuries.  Having one leg that is shorter than the other.  Conditions that affect the joints, such as: ? Rheumatoid arthritis. ? Gout. ? Psoriatic arthritis. ? Joint infection (septic arthritis). Sometimes, the cause of SI joint dysfunction is not known. What are the signs or symptoms? Symptoms of this condition include:  Aching or burning pain in the lower back. The pain may also spread to other areas, such as: ? Buttocks. ? Groin. ? Thighs.  Muscle spasms in or around the painful areas.  Increased pain when standing, walking, running, stair climbing, bending, or lifting. How is this diagnosed? This condition is diagnosed with a physical exam and medical history. During the exam, the health care provider may move one or both of your legs to different positions to check for pain. Various tests may be done to confirm the diagnosis, including:  Imaging tests to look for other causes of pain. These may include: ? MRI. ? CT scan. ? Bone  scan.  Diagnostic injection. A numbing medicine is injected into the SI joint using a needle. If your pain is temporarily improved or stopped after the injection, this can indicate that SI joint dysfunction is the problem. How is this treated? Treatment depends on the cause and severity of your condition. Treatment options may include:  Ice or heat applied to the lower back area after an injury. This may help reduce pain and muscle spasms.  Medicines to relieve pain or inflammation or to relax the muscles.  Wearing a back brace (sacroiliac brace) to help support the joint while your back is healing.  Physical therapy to increase muscle strength around the joint and flexibility at the joint. This may also involve learning proper body positions and ways of moving to relieve stress on the joint.  Direct manipulation of the SI joint.  Injections of steroid medicine into the joint to reduce pain and swelling.  Radiofrequency ablation to burn away nerves that are carrying pain messages from the joint.  Use of a device that provides electrical stimulation to help reduce pain at the joint.  Surgery to put in screws and plates that limit or prevent joint motion. This is rare. Follow these instructions at home: Medicines  Take over-the-counter and prescription medicines only as told by your health care provider.  Do not drive or use heavy machinery while taking prescription pain medicine.  If you are taking prescription pain medicine, take actions to prevent or treat constipation. Your health care provider may recommend that  you: ? Drink enough fluid to keep your urine pale yellow. ? Eat foods that are high in fiber, such as fresh fruits and vegetables, whole grains, and beans. ? Limit foods that are high in fat and processed sugars, such as fried or sweet foods. ? Take an over-the-counter or prescription medicine for constipation. If you have a brace:  Wear the brace as told by your  health care provider. Remove it only as told by your health care provider.  Keep the brace clean.  If the brace is not waterproof: ? Do not let it get wet. ? Cover it with a watertight covering when you take a bath or a shower. Managing pain, stiffness, and swelling      Icing can help with pain and swelling. Heat may help with muscle tension or spasms. Ask your health care provider if you should use ice or heat.  If directed, put ice on the affected area: ? If you have a removable brace, remove it as told by your health care provider. ? Put ice in a plastic bag. ? Place a towel between your skin and the bag. ? Leave the ice on for 20 minutes, 2-3 times a day.  If directed, apply heat to the affected area. Use the heat source that your health care provider recommends, such as a moist heat pack or a heating pad. ? Place a towel between your skin and the heat source. ? Leave the heat on for 20-30 minutes. ? Remove the heat if your skin turns bright red. This is especially important if you are unable to feel pain, heat, or cold. You may have a greater risk of getting burned. General instructions  Rest as needed. Ask your health care provider what activities are safe for you.  Return to your normal activities as told by your health care provider.  Exercise as directed by your health care provider or physical therapist.  Do not use any products that contain nicotine or tobacco, such as cigarettes and e-cigarettes. These can delay bone healing. If you need help quitting, ask your health care provider.  Keep all follow-up visits as told by your health care provider. This is important. Contact a health care provider if:  Your pain is not controlled with medicine.  You have a fever.  Your pain is getting worse. Get help right away if:  You have weakness, numbness, or tingling in your legs or feet.  You lose control of your bladder or bowel. Summary  Sacroiliac joint  dysfunction is a condition that causes inflammation on one or both sides of the sacroiliac (SI) joint.  This condition causes deep aching or burning pain in the low back. In some cases, the pain may also spread into one or both buttocks, hips, or thighs.  Treatment depends on the cause and severity of your condition. It may include medicines to reduce pain and swelling or to relax muscles. This information is not intended to replace advice given to you by your health care provider. Make sure you discuss any questions you have with your health care provider. Document Revised: 11/17/2017 Document Reviewed: 05/03/2017 Elsevier Patient Education  2020 [place] lower leg venous system is not the most reliable, the heart does NOT pump fluid up, there is a valve system.  The muscles of the leg squeeze and the blood moves up and a valve opens and close, then they squeeze, blood moves up and valves open and closes  keeping the blood moving towards the heart.  Lots can go wrong with this valve system.  If someone is sitting or standing without movement, everyone will get swelling.  THINGS TO DO:  Do not stand or sit in one position for long periods of time. Do not sit with your legs crossed. Rest with your legs raised during the day.  Your legs have to be higher than your heart so that gravity will force the valves to open, so please really elevate your legs.   Wear elastic stockings or support hose. Do not wear other tight, encircling garments around the legs, pelvis, or waist.  [employer]  has a wide variety of well priced compression stockings. [place], [place] [address] [address] has a good cheap selection, I like the socks, they are not as hard to get on  Walk as much as possible to increase blood flow.  Raise the foot of your bed at night with 2-inch blocks.  SEEK MEDICAL CARE IF:   The skin around your ankle starts  to break down.  You have pain, redness, tenderness, or hard swelling developing in your leg over a vein.  You are uncomfortable due to leg pain.  If you ever have shortness of breath with exertion or chest pain go to the ER.   Morton Neuralgia  Morton neuralgia is foot pain that affects the ball of the foot and the area near the toes. Morton neuralgia occurs when part of a nerve in the foot (digital nerve) is under too much pressure (compressed). When this happens over a long period of time, the nerve can thicken (neuroma) and cause pain. Pain usually occurs between the third and fourth toes.  Morton neuralgia can come and go but may get worse over time. What are the causes? This condition is caused by doing the same things over and over with your foot, such as:  Activities such as running or jumping.  Wearing shoes that are too tight. What increases the risk? You may be at higher risk for Morton neuralgia if you:  Are female.  Wear high heels.  Wear shoes that are narrow or tight.  Do activities that repeatedly stretch your toes, such as: ? Running. ? [place]. ? Long-distance walking. What are the signs or symptoms? The first symptom of Morton neuralgia is pain that spreads from the ball of the foot to the toes. It may feel like you are walking on a marble. Pain usually gets worse with walking and goes away at night. Other symptoms may include numbness and cramping of your toes. Both feet are equally affected, but rarely at the same time. How is this diagnosed? This condition is diagnosed based on your symptoms, your medical history, and a physical exam. Your health care provider may:  Squeeze your foot just behind your toe.  Ask you to move your toes to check for pain.  Ask about your physical activity level. You also may have imaging tests, such as an X-ray, ultrasound, or MRI. How is this treated? Treatment depends on how severe your condition is and what causes it.  Treatment may involve:  Wearing different shoes that are not too tight, are low-heeled, and provide good support. For some people, this is the only treatment needed.  Wearing an over-the-counter or custom supportive pad (orthotic) under the front of your foot.  Getting injections of numbing medicine and anti-inflammatory medicine (steroid) in the nerve.  Having surgery to  remove part of the thickened nerve. Follow these instructions at home: Managing pain, stiffness, and swelling   Massage your foot as needed.  Wear orthotics as told by your health care provider.  If directed, put ice on your foot: ? Put ice in a plastic bag. ? Place a towel between your skin and the bag. ? Leave the ice on for 20 minutes, 2-3 times a day.  Avoid activities that cause pain or make pain worse. If you play sports, ask your health care provider when it is safe for you to return to sports.  Raise (elevate) your foot above the level of your heart while lying down and, when possible, while sitting. General instructions  Take over-the-counter and prescription medicines only as told by your health care provider.  Do not drive or use heavy machinery while taking prescription pain medicine.  Wear shoes that: ? Have soft soles. ? Have a wide toe area. ? Provide arch support. ? Do not pinch or squeeze your feet. ? Have room for your orthotics, if applicable.  Keep all follow-up visits as told by your health care provider. This is important. Contact a health care provider if:  Your symptoms get worse or do not get better with treatment and home care. Summary  Morton neuralgia is foot pain that affects the ball of the foot and the area near the toes. Pain usually occurs between the third and fourth toes, gets worse with walking, and goes away at night.  Morton neuralgia occurs when part of a nerve in the foot (digital nerve) is under too much pressure. When this happens over a long period of time, the  nerve can thicken (neuroma) and cause pain.  This condition is caused by doing the same things over and over with your foot, such as running or jumping, wearing shoes that are too tight, or wearing high heels.  Treatment may involve wearing low-heeled shoes that are not too tight, wearing a supportive pad (orthotic) under the front of your foot, getting injections in the nerve, or having surgery to remove part of the thickened nerve. This information is not intended to replace advice given to you by your health care provider. Make sure you discuss any questions you have with your health care provider. Document Revised: 04/06/2017 Document Reviewed: 04/06/2017 Elsevier Patient Education  [employer].   Here is some information about the vaccines. The data is very good and this information hopefully answers a lot of your questions and give you a confidence boost.   The Pfizer and Moderna vaccines are messenger RNA vaccines. That technology is not new, it has been studied for 20 years at least, used for cancer and MS treatment. They had started using the vaccine for MERS AND SARS (both a different coronavirus) 10 years ago but never finished so we had a good backbone for this vaccine.   There were no short cuts with the techniques for these clinical  trials, just lots of willing participants quickly and lots of up front money helped speed up the process.   The mRNA is very fragile which is why it needs to be kept so cold and thawed a certain way, think of it as a message in a glass bottle. NO PART of the virus is in this vaccine, it is a clip of the genetic sequence. This mRNA is injected in your arm, connects with a ribosome, delivers the message and the degrades.  That is part of the cause of the  sore arm, the mRNA never leaves your arm. It degrades there. The mRNA does not go into our nucleotide where our DNA is, and we would need a DNA reverse transcriptase to take RNA to DNA, we do not have  this, it can not change our DNA. The ribosome that got the message creates a protein and that protein circulates in our body and we have an immune reaction to that creating antibodies. Any time our immune system is triggered, inflammation is triggered too so you can have a temp, muscle aches, etc. Normal reaction.  I have seen so many patients that just had mild COVID in the office weeks later still have issues. We are still learning about post COVID syndrome, the CDC should be coming out for guidelines for practioners soon. There is too much unknown about COVID. We have been using vaccines for over 100 years or more, i Personnel officer. Ask your parents or any older friends about polio and that vaccine, that was a disease shutting down schools,had kids in iron lung, devastating young kids and families. People lined up for that vaccine and technology has only improved.   Please get the vaccine. If you have any further questions please make an appointment in the office to discuss further.  [place]  Ask insurance and pharmacy about shingrix - it is a 2 part shot that we will not be getting in the office.   Suggest getting AFTER covid vaccines, have to wait at least a month This shot can make you feel bad due to such good immune response it can trigger some inflammation so take tylenol or aleve day of or day after and plan on resting.   Can go to [email or web address] for more information  Shingrix Vaccination  Two vaccines are licensed and recommended to prevent shingles in the [place] Zoster vaccine live (ZVL, Zostavax) has been in use since 2006. Recombinant zoster vaccine (RZV, Shingrix), has been in use since 2017 and is recommended by ACIP as the preferred shingles vaccine.  What Everyone Should Know about Shingles Vaccine (Shingrix) One of the Recommended Vaccines by Disease Shingles vaccination is the only way to protect against shingles and  postherpetic neuralgia (PHN), the most common complication from shingles. CDC recommends that healthy adults 50 years and older get two doses of the shingles vaccine called Shingrix (recombinant zoster vaccine), separated by 2 to 6 months, to prevent shingles and the complications from the disease. Your doctor or pharmacist can give you Shingrix as a shot in your upper arm. Shingrix provides strong protection against shingles and PHN. Two doses of Shingrix is more than 90% effective at preventing shingles and PHN. Protection stays above 85% for at least the first four years after you get vaccinated. Shingrix is the preferred vaccine, over Zostavax (zoster vaccine live), a shingles vaccine in use since 2006. Zostavax may still be used to prevent shingles in healthy adults 60 years and older. For example, you could use Zostavax if a person is allergic to Shingrix, prefers Zostavax, or requests immediate vaccination and Shingrix is unavailable. Who Should Get Shingrix? Healthy adults 50 years and older should get two doses of Shingrix, separated by 2 to 6 months. You should get Shingrix even if in the past you . had shingles  . received Zostavax  . are not sure if you had chickenpox There is no maximum age for getting Shingrix. If you had shingles in the past, you can get Shingrix to help prevent future occurrences of the disease.  There is no specific length of time that you need to wait after having shingles before you can receive Shingrix, but generally you should make sure the shingles rash has gone away before getting vaccinated. You can get Shingrix whether or not you remember having had chickenpox in the past. Studies show that more than 99% of Americans 40 years and older have had chickenpox, even if they don't remember having the disease. Chickenpox and shingles are related because they are caused by the same virus (varicella zoster virus). After a person recovers from chickenpox, the virus stays  dormant (inactive) in the body. It can reactivate years later and cause shingles. If you had Zostavax in the recent past, you should wait at least eight weeks before getting Shingrix. Talk to your healthcare provider to determine the best time to get Shingrix. Shingrix is available in [facility] and pharmacies. To find doctor's offices or pharmacies near you that offer the vaccine, visit HealthMap Vaccine FinderExternal. If you have questions about Shingrix, talk with your healthcare provider. Vaccine for Those 66 Years and Older  Shingrix reduces the risk of shingles and PHN by more than 90% in people 41 and older. CDC recommends the vaccine for healthy adults 65 and older.  Who Should Not Get Shingrix? You should not get Shingrix if you: . have ever had a severe allergic reaction to any component of the vaccine or after a dose of Shingrix  . tested negative for immunity to varicella zoster virus. If you test negative, you should get chickenpox vaccine.  . currently have shingles  . currently are pregnant or breastfeeding. Women who are pregnant or breastfeeding should wait to get Shingrix.  Marland Kitchen receive specific antiviral drugs (acyclovir, famciclovir, or valacyclovir) 24 hours before vaccination (avoid use of these antiviral drugs for 14 days after vaccination)- zoster vaccine live only If you have a minor acute (starts suddenly) illness, such as a cold, you may get Shingrix. But if you have a moderate or severe acute illness, you should usually wait until you recover before getting the vaccine. This includes anyone with a temperature of 101.40F or higher. The side effects of the Shingrix are temporary, and usually last 2 to 3 days. While you may experience pain for a few days after getting Shingrix, the pain will be less severe than having shingles and the complications from the disease. How Well Does Shingrix Work? Two doses of Shingrix provides strong protection against shingles and  postherpetic neuralgia (PHN), the most common complication of shingles. . In adults 58 to 63 years old who got two doses, Shingrix was 97% effective in preventing shingles; among adults 70 years and older, Shingrix was 91% effective.  . In adults 55 to 63 years old who got two doses, Shingrix was 91% effective in preventing PHN; among adults 70 years and older, Shingrix was 89% effective. Shingrix protection remained high (more than 85%) in people 70 years and older throughout the four years following vaccination. Since your risk of shingles and PHN increases as you get older, it is important to have strong protection against shingles in your older years. Top of Page  What Are the Possible Side Effects of Shingrix? Studies show that Shingrix is safe. The vaccine helps your body create a strong defense against shingles. As a result, you are likely to have temporary side effects from getting the shots. The side effects may affect your ability to do normal daily activities for 2 to 3 days. Most people got a  sore arm with mild or moderate pain after getting Shingrix, and some also had redness and swelling where they got the shot. Some people felt tired, had muscle pain, a headache, shivering, fever, stomach pain, or nausea. About 1 out of 6 people who got Shingrix experienced side effects that prevented them from doing regular activities. Symptoms went away on their own in about 2 to 3 days. Side effects were more common in younger people. You might have a reaction to the first or second dose of Shingrix, or both doses. If you experience side effects, you may choose to take over-the-counter pain medicine such as ibuprofen or acetaminophen. If you experience side effects from Shingrix, you should report them to the Vaccine Adverse Event Reporting System (VAERS). Your doctor might file this report, or you can do it yourself through the VAERS websiteExternal, or by calling [phone]. If you have any  questions about side effects from Shingrix, talk with your doctor. The shingles vaccine does not contain thimerosal (a preservative containing mercury). Top of Page  When Should I See a Doctor Because of the Side Effects I Experience From Shingrix? In clinical trials, Shingrix was not associated with serious adverse events. In fact, serious side effects from vaccines are extremely rare. For example, for every 1 million doses of a vaccine given, only one or two people may have a severe allergic reaction. Signs of an allergic reaction happen within minutes or hours after vaccination and include hives, swelling of the face and throat, difficulty breathing, a fast heartbeat, dizziness, or weakness. If you experience these or any other life-threatening symptoms, see a doctor right away. Shingrix causes a strong response in your immune system, so it may produce short-term side effects more intense than you are used to from other vaccines. These side effects can be uncomfortable, but they are expected and usually go away on their own in 2 or 3 days. Top of Page  How Can I Pay For Shingrix? There are several ways shingles vaccine may be paid for: Medicare . Medicare Part D plans cover the shingles vaccine, but there may be a cost to you depending on your plan. There may be a copay for the vaccine, or you may need to pay in full then get reimbursed for a certain amount.  . Medicare Part B does not cover the shingles vaccine. Medicaid . Medicaid may or may not cover the vaccine. Contact your insurer to find out. Private health insurance . Many private health insurance plans will cover the vaccine, but there may be a cost to you depending on your plan. Contact your insurer to find out. Vaccine assistance programs . Some pharmaceutical companies provide vaccines to eligible adults who cannot afford them. You may want to check with the vaccine manufacturer, [employer], about Shingrix. If you do not  currently have health insurance, learn more about affordable health coverage optionsExternal. To find doctor's offices or pharmacies near you that offer the vaccine, visit HealthMap Vaccine FinderExternal.

## 2019-06-20 ENCOUNTER — Other Ambulatory Visit: Payer: Self-pay | Admitting: Physician Assistant

## 2019-06-20 DIAGNOSIS — M533 Sacrococcygeal disorders, not elsewhere classified: Secondary | ICD-10-CM

## 2019-06-20 DIAGNOSIS — Z1589 Genetic susceptibility to other disease: Secondary | ICD-10-CM

## 2019-06-20 LAB — COMPLETE METABOLIC PANEL WITH GFR
AG Ratio: 1.5 (calc) (ref 1.0–2.5)
ALT: 13 U/L (ref 6–29)
AST: 13 U/L (ref 10–35)
Albumin: 4.3 g/dL (ref 3.6–5.1)
Alkaline phosphatase (APISO): 85 U/L (ref 37–153)
BUN: 14 mg/dL (ref 7–25)
CO2: 27 mmol/L (ref 20–32)
Calcium: 10.1 mg/dL (ref 8.6–10.4)
Chloride: 103 mmol/L (ref 98–110)
Creat: 0.79 mg/dL (ref 0.50–0.99)
GFR, Est African American: 92 mL/min/{1.73_m2} (ref >=60)
GFR, Est Non African American: 80 mL/min/{1.73_m2} (ref >=60)
Globulin: 2.8 g/dL (calc) (ref 1.9–3.7)
Glucose, Bld: 176 mg/dL — ABNORMAL HIGH (ref 65–99)
Potassium: 4.2 mmol/L (ref 3.5–5.3)
Sodium: 135 mmol/L (ref 135–146)
Total Bilirubin: 0.5 mg/dL (ref 0.2–1.2)
Total Protein: 7.1 g/dL (ref 6.1–8.1)

## 2019-06-20 LAB — SJOGRENS SYNDROME-B EXTRACTABLE NUCLEAR ANTIBODY: SSB (La) (ENA) Antibody, IgG: <1 AI

## 2019-06-20 LAB — HLA-B27 ANTIGEN: HLA-B27 Antigen: POSITIVE — AB

## 2019-06-20 LAB — URINALYSIS, ROUTINE W REFLEX MICROSCOPIC
Bacteria, UA: NONE SEEN /HPF
Bilirubin Urine: NEGATIVE
Hgb urine dipstick: NEGATIVE
Hyaline Cast: NONE SEEN /LPF
Ketones, ur: NEGATIVE
Nitrite: NEGATIVE
Protein, ur: NEGATIVE
RBC / HPF: NONE SEEN /HPF (ref 0–2)
Specific Gravity, Urine: 1.024 (ref 1.001–1.03)
Squamous Epithelial / HPF: NONE SEEN /HPF (ref <=5)
WBC, UA: NONE SEEN /HPF (ref 0–5)
pH: <=5 (ref 5.0–8.0)

## 2019-06-20 LAB — IRON, TOTAL/TOTAL IRON BINDING CAP
%SAT: 24 % (calc) (ref 16–45)
Iron: 85 ug/dL (ref 45–160)
TIBC: 356 mcg/dL (calc) (ref 250–450)

## 2019-06-20 LAB — CBC WITH DIFFERENTIAL/PLATELET
Absolute Monocytes: 463 cells/uL (ref 200–950)
Basophils Absolute: 142 cells/uL (ref 0–200)
Basophils Relative: 1.6 %
Eosinophils Absolute: 303 cells/uL (ref 15–500)
Eosinophils Relative: 3.4 %
HCT: 43 % (ref 35.0–45.0)
Hemoglobin: 14.1 g/dL (ref 11.7–15.5)
Lymphs Abs: 3382 cells/uL (ref 850–3900)
MCH: 27.8 pg (ref 27.0–33.0)
MCHC: 32.8 g/dL (ref 32.0–36.0)
MCV: 84.8 fL (ref 80.0–100.0)
MPV: 10.4 fL (ref 7.5–12.5)
Monocytes Relative: 5.2 %
Neutro Abs: 4610 cells/uL (ref 1500–7800)
Neutrophils Relative %: 51.8 %
Platelets: 368 10*3/uL (ref 140–400)
RBC: 5.07 10*6/uL (ref 3.80–5.10)
RDW: 12.4 % (ref 11.0–15.0)
Total Lymphocyte: 38 %
WBC: 8.9 10*3/uL (ref 3.8–10.8)

## 2019-06-20 LAB — LIPID PANEL
Cholesterol: 136 mg/dL (ref <200)
HDL: 60 mg/dL (ref >=50)
LDL Cholesterol (Calc): 55 mg/dL (calc)
Non-HDL Cholesterol (Calc): 76 mg/dL (calc) (ref <130)
Total CHOL/HDL Ratio: 2.3 (calc) (ref <5.0)
Triglycerides: 124 mg/dL (ref <150)

## 2019-06-20 LAB — HEMOGLOBIN A1C
Hgb A1c MFr Bld: 9.9 % of total Hgb — ABNORMAL HIGH (ref <5.7)
Mean Plasma Glucose: 237 (calc)
eAG (mmol/L): 13.2 (calc)

## 2019-06-20 LAB — VITAMIN D 25 HYDROXY (VIT D DEFICIENCY, FRACTURES): Vit D, 25-Hydroxy: 66 ng/mL (ref 30–100)

## 2019-06-20 LAB — MICROALBUMIN / CREATININE URINE RATIO
Creatinine, Urine: 76 mg/dL (ref 20–275)
Microalb Creat Ratio: 4 mcg/mg creat (ref <30)
Microalb, Ur: 0.3 mg/dL

## 2019-06-20 LAB — VITAMIN B12: Vitamin B-12: 579 pg/mL (ref 200–1100)

## 2019-06-20 LAB — TSH: TSH: 4.07 mIU/L (ref 0.40–4.50)

## 2019-06-20 LAB — SJOGRENS SYNDROME-A EXTRACTABLE NUCLEAR ANTIBODY: SSA (Ro) (ENA) Antibody, IgG: <1 AI

## 2019-06-20 LAB — MAGNESIUM: Magnesium: 1.7 mg/dL (ref 1.5–2.5)

## 2019-06-28 ENCOUNTER — Other Ambulatory Visit: Payer: Self-pay | Admitting: Physician Assistant

## 2019-07-25 ENCOUNTER — Telehealth: Payer: Self-pay | Admitting: Physician Assistant

## 2019-07-25 DIAGNOSIS — F988 Other specified behavioral and emotional disorders with onset usually occurring in childhood and adolescence: Secondary | ICD-10-CM

## 2019-07-25 MED ORDER — LISDEXAMFETAMINE DIMESYLATE 20 MG PO CAPS: 20.0000 mg | ORAL_CAPSULE | Freq: Every day | ORAL | 0 refills | Status: DC

## 2019-07-25 NOTE — Telephone Encounter (Signed)
-----   Message from Elenor Quinones, [place] sent at 07/25/2019 11:05 AM EDT ----- Regarding: PHONE MED REQUEST REFILL: vyvanse 20 mgs  Requested: 90 day supply  [facility]   Please & THANK YOU

## 2019-07-27 ENCOUNTER — Other Ambulatory Visit: Payer: Self-pay | Admitting: Internal Medicine

## 2019-09-25 ENCOUNTER — Ambulatory Visit: Payer: 59 | Admitting: Physician Assistant

## 2019-10-02 NOTE — Progress Notes (Deleted)
FOLLOW UP  Assessment and Plan:   HA with left sided tinnitus Declines work up with CTA or MRI to rule out aneurysm Will increase losartan to 100mg  Get cataract surgery  And if continue to have HA will get imaging Will go to the ER if worsening headache, changes vision/speech, imbalance, weakness.  Hypertension Will increase the losartan to 100 mg Monitor blood pressure at home; patient to call if consistently greater than 130/80 Continue DASH diet.   Reminder to go to the ER if any CP, SOB, nausea, dizziness, severe HA, changes vision/speech, left arm numbness and tingling and jaw pain.  Cholesterol Currently near goal; currently taking 10 mg rosuvastatin every other day- never increased as she was advised to do Continue low cholesterol diet and exercise.  Check lipid panel.   Diabetes without complications Continue insulin and metformin Check A1C Not seeing endocrinology anymore  Overweight Long discussion about weight loss, diet, and exercise Recommended diet heavy in fruits and veggies and low in animal meats, cheeses, and dairy products, appropriate calorie intake Discussed ideal weight for height Will follow up in 3 months  Vitamin D Def Below goal at last visit; taking 5000 IU daily  check Vit D level  Depression/anxiety Continue medications  Lifestyle discussed: diet/exerise, sleep hygiene, stress management, hydration   Continue diet and meds as discussed. Further disposition pending results of labs. Discussed med's effects and SE's.   Over 30 minutes of exam, counseling, chart review, and critical decision making was performed.   Future Appointments  Date Time Provider [place]  10/03/2019  8:45 AM Vicie Mutters, PA-C [facility] None  06/18/2020  2:00 PM Vicie Mutters, PA-C [facility] None    ----------------------------------------------------------------------------------------------------------------------  HPI 64 y.o. female  presents  for 3 month follow up on hypertension, cholesterol, diabetes, weight and vitamin D deficiency.   She has had a mild low HA x 1 month, has pulsatile tinnitus in left ear.  She states she is always tired, normal B12 and iron in June. She has history of normal sleep study long time ago. She states she does not sleep well.   BMI is There is no height or weight on file to calculate BMI., she has been working on diet, watches her grandson but otherwise not exercise.   Wt Readings from Last 3 Encounters:  06/19/19 172 lb 6.4 oz (78.2 kg)  01/26/19 167 lb (75.8 kg)  01/16/19 167 lb (75.8 kg)   Her blood pressure has been controlled at home, today their BP is    She does not workout, but watches her 40 y/o grandchild and consequently very acitve She denies chest pain, shortness of breath, dizziness.   She has been working on diet and exercise for T2DM  With CKD on ARB Losartan 100 Hyperlipiemia on Crestor 10 mg every other day close to goal less than 70. She is on 70/30 15 in the AM and 10 at night.  She could not tolerate the ozepmic, had reaction site issues with trulicity but tolerated well and not itchy or bad and denies foot ulcerations, increased appetite, nausea, paresthesia of the feet and visual disturbances.  She was switched from 500ER metformin to the 750mg  ER due to recall.  She does check fasting sugars, reports currently running 120-140 [place] meter  Last A1C in the office was:  Lab Results  Component Value Date   HGBA1C 9.9 (H) 06/19/2019   Lab Results  Component Value Date   CHOL 136 06/19/2019   HDL 60 06/19/2019  LDLCALC 55 06/19/2019   TRIG 124 06/19/2019   CHOLHDL 2.3 06/19/2019    Lab Results  Component Value Date   GFRNONAA 80 06/19/2019    Patient is not on Vitamin D supplement.   Lab Results  Component Value Date   VD25OH 66 06/19/2019       Current Medications:  Current Outpatient Medications on File Prior to Visit  Medication Sig  . acetaminophen  (TYLENOL) 500 MG tablet Take 1,000 mg by mouth daily as needed for moderate pain or headache.  . albuterol (PROVENTIL HFA;VENTOLIN HFA) 108 (90 Base) MCG/ACT inhaler  Use 1 to 2 Inhalations 15 minutes apart every 4 hours to Rescue Asthma (Patient taking differently: as needed. )  . ALPRAZolam (XANAX) 0.5 MG tablet Take 1 tablet (0.5 mg total) by mouth 2 (two) times daily as needed.  Marland Kitchen amitriptyline (ELAVIL) 50 MG tablet Take 1 tablet at Bedtime as needed for Sleep  . aspirin 81 MG tablet Take 81 mg by mouth daily.  . CHOLECALCIFEROL PO Take 5,000 Units by mouth daily.  Marland Kitchen escitalopram (LEXAPRO) 20 MG tablet Take 1 tablet Daily for Mood  . fluticasone (FLONASE) 50 MCG/ACT nasal spray Place 2 sprays into both nostrils daily.  Marland Kitchen ibuprofen (ADVIL,MOTRIN) 800 MG tablet Take 1 tablet (800 mg total) by mouth every 8 (eight) hours as needed.  . Insulin Syringe-Needle U-100 (BD INSULIN SYRINGE U/F) 31G X 5/16" 1 ML MISC Use twice a day  . ipratropium (ATROVENT) 0.06 % nasal spray Place 2 sprays into the nose 3 (three) times daily.  Marland Kitchen levocetirizine (XYZAL) 5 MG tablet Take 1 tablet Daily for Allergies  . lisdexamfetamine (VYVANSE) 20 MG capsule Take 1 capsule (20 mg total) by mouth daily.  Marland Kitchen losartan (COZAAR) 100 MG tablet TAKE 1 TABLET DAILY FOR BP & DIABETIC KIDNEY PROTECTION  . metFORMIN (GLUCOPHAGE-XR) 750 MG 24 hr tablet Take 1 tablet 2 x /day with Meals for Diabetes  . NOVOLIN 70/30 (70-30) 100 UNIT/ML injection INJECT 25 UNITS IN THE AM AND 20 UNITS AT DINNER  . olopatadine (PATANOL) 0.1 % ophthalmic solution PLACE 1 DROP INTO BOTH EYES 2 (TWO) TIMES DAILY.  . rosuvastatin (CRESTOR) 20 MG tablet Take 1 tablet daily for Cholesterol  . tiZANidine (ZANAFLEX) 2 MG tablet TAKE 1 TABLET (2 MG TOTAL) BY MOUTH EVERY 6 (SIX) HOURS AS NEEDED FOR MUSCLE SPASMS.  Marland Kitchen traMADol (ULTRAM) 50 MG tablet Take 1 tablet (50 mg total) by mouth every 6 (six) hours as needed.   No current facility-administered medications  on file prior to visit.     Allergies:  Allergies  Allergen Reactions  . Tape Rash  . Ace Inhibitors Cough  . Metformin And Related     myalgia  . Morphine And Related Itching  . Penicillins     Pt states that in 3rd grade she was given an injection in the buttock and reacted with a large hive in that area Pt can take oral penicillin with no reaction    . Wellbutrin [Bupropion]     Felt funny     Medical History:  Past Medical History:  Diagnosis Date  . ADD (attention deficit disorder)   . Allergy   . Anxiety   . Arthritis   . Asthma    with severe allergies pt does use albuterol inhaler when needed  . Cataract   . Depression   . Diabetes mellitus without complication ([place]) 7096   TYPE 2   . GERD (gastroesophageal reflux disease)  hx of had Nissen Fundoplication 2637 has not had any issues since  . History of hiatal hernia   . Hyperlipidemia 2004  . Hypertension   . IBS (irritable bowel syndrome)   . Lynch syndrome   . Tubular adenoma of colon    Family history- Reviewed and unchanged Social history- Reviewed and unchanged   Review of Systems:  Review of Systems  Constitutional: Negative for malaise/fatigue and weight loss.  HENT: Negative for hearing loss and tinnitus.   Eyes: Negative for blurred vision and double vision.  Respiratory: Negative for cough, shortness of breath and wheezing.   Cardiovascular: Negative for chest pain, palpitations, orthopnea, claudication and leg swelling.  Gastrointestinal: Negative for abdominal pain, blood in stool, constipation, diarrhea, heartburn, melena, nausea and vomiting.  Genitourinary: Negative.   Musculoskeletal: Negative for joint pain and myalgias.  Skin: Negative for rash.  Neurological: Negative for dizziness, tingling, sensory change, weakness and headaches.  Endo/Heme/Allergies: Negative for polydipsia.  Psychiatric/Behavioral: Positive for depression. The patient is nervous/anxious and has insomnia.    All other systems reviewed and are negative.   Physical Exam: LMP  (LMP Unknown)  Wt Readings from Last 3 Encounters:  06/19/19 172 lb 6.4 oz (78.2 kg)  01/26/19 167 lb (75.8 kg)  01/16/19 167 lb (75.8 kg)   General Appearance: Well nourished, in no apparent distress. Eyes: PERRLA, EOMs, conjunctiva no swelling or erythema Sinuses: No Frontal/maxillary tenderness ENT/Mouth: Ext aud canals clear, TMs without erythema, bulging. No erythema, swelling, or exudate on post pharynx.  Tonsils not swollen or erythematous. Hearing normal.  Neck: Supple, thyroid normal.  Respiratory: Respiratory effort normal, BS equal bilaterally without rales, rhonchi, wheezing or stridor.  Cardio: sinus tachy with no MRGs. Brisk peripheral pulses without edema.  Abdomen: Soft, + BS.  Non tender, no guarding, rebound, hernias, masses. Lymphatics: Non tender without lymphadenopathy.  Musculoskeletal: Full ROM, 5/5 strength, Normal gait Skin: Warm, dry without rashes, lesions, ecchymosis.  Neuro: Cranial nerves intact. No cerebellar symptoms.  Psych: Awake and oriented X 3, normal affect, Insight and Judgment appropriate.    Vicie Mutters, PA-C 1:34 PM [facility]

## 2019-10-03 ENCOUNTER — Ambulatory Visit: Payer: 59 | Admitting: Physician Assistant

## 2019-10-11 NOTE — Progress Notes (Deleted)
FOLLOW UP  Assessment and Plan:   HA with left sided tinnitus Declines work up with CTA or MRI to rule out aneurysm Will increase losartan to 100mg  Get cataract surgery  And if continue to have HA will get imaging Will go to the ER if worsening headache, changes vision/speech, imbalance, weakness.  Hypertension Will increase the losartan to 100 mg Monitor blood pressure at home; patient to call if consistently greater than 130/80 Continue DASH diet.   Reminder to go to the ER if any CP, SOB, nausea, dizziness, severe HA, changes vision/speech, left arm numbness and tingling and jaw pain.  Cholesterol Currently near goal; currently taking 10 mg rosuvastatin every other day- never increased as she was advised to do Continue low cholesterol diet and exercise.  Check lipid panel.   Diabetes without complications Continue insulin and metformin Check A1C Not seeing endocrinology anymore  Overweight Long discussion about weight loss, diet, and exercise Recommended diet heavy in fruits and veggies and low in animal meats, cheeses, and dairy products, appropriate calorie intake Discussed ideal weight for height Will follow up in 3 months  Vitamin D Def Below goal at last visit; taking 5000 IU daily  check Vit D level  Depression/anxiety Continue medications  Lifestyle discussed: diet/exerise, sleep hygiene, stress management, hydration   Continue diet and meds as discussed. Further disposition pending results of labs. Discussed med's effects and SE's.   Over 30 minutes of exam, counseling, chart review, and critical decision making was performed.   Future Appointments  Date Time Provider [place]  10/12/2019 10:00 AM Vicie Mutters, PA-C [facility] None  06/18/2020  2:00 PM Vicie Mutters, PA-C [facility] None    ----------------------------------------------------------------------------------------------------------------------  HPI 64 y.o. female  presents  for 3 month follow up on hypertension, cholesterol, diabetes, weight and vitamin D deficiency.   She has had a mild low HA x 1 month, has pulsatile tinnitus in left ear.  She states she is always tired, normal B12 and iron in June. She has history of normal sleep study long time ago. She states she does not sleep well.   BMI is There is no height or weight on file to calculate BMI., she has been working on diet, watches her grandson but otherwise not exercise.   Wt Readings from Last 3 Encounters:  06/19/19 172 lb 6.4 oz (78.2 kg)  01/26/19 167 lb (75.8 kg)  01/16/19 167 lb (75.8 kg)   Her blood pressure has been controlled at home, today their BP is    She does not workout, but watches her 5 y/o grandchild and consequently very acitve She denies chest pain, shortness of breath, dizziness.   She has been working on diet and exercise for T2DM  With CKD on ARB Losartan 100 Hyperlipiemia on Crestor 10 mg every other day close to goal less than 70. She is on 70/30 15 in the AM and 10 at night.  She could not tolerate the ozepmic, had reaction site issues with trulicity but tolerated well and not itchy or bad and denies foot ulcerations, increased appetite, nausea, paresthesia of the feet and visual disturbances.  She was switched from 500ER metformin to the 750mg  ER due to recall.  She does check fasting sugars, reports currently running 120-140 [place] meter  Last A1C in the office was:  Lab Results  Component Value Date   HGBA1C 9.9 (H) 06/19/2019   Lab Results  Component Value Date   CHOL 136 06/19/2019   HDL 60 06/19/2019  LDLCALC 55 06/19/2019   TRIG 124 06/19/2019   CHOLHDL 2.3 06/19/2019    Lab Results  Component Value Date   GFRNONAA 80 06/19/2019    Patient is not on Vitamin D supplement.   Lab Results  Component Value Date   VD25OH 66 06/19/2019       Current Medications:  Current Outpatient Medications on File Prior to Visit  Medication Sig  . acetaminophen  (TYLENOL) 500 MG tablet Take 1,000 mg by mouth daily as needed for moderate pain or headache.  . albuterol (PROVENTIL HFA;VENTOLIN HFA) 108 (90 Base) MCG/ACT inhaler  Use 1 to 2 Inhalations 15 minutes apart every 4 hours to Rescue Asthma (Patient taking differently: as needed. )  . ALPRAZolam (XANAX) 0.5 MG tablet Take 1 tablet (0.5 mg total) by mouth 2 (two) times daily as needed.  Marland Kitchen amitriptyline (ELAVIL) 50 MG tablet Take 1 tablet at Bedtime as needed for Sleep  . aspirin 81 MG tablet Take 81 mg by mouth daily.  . CHOLECALCIFEROL PO Take 5,000 Units by mouth daily.  Marland Kitchen escitalopram (LEXAPRO) 20 MG tablet Take 1 tablet Daily for Mood  . fluticasone (FLONASE) 50 MCG/ACT nasal spray Place 2 sprays into both nostrils daily.  Marland Kitchen ibuprofen (ADVIL,MOTRIN) 800 MG tablet Take 1 tablet (800 mg total) by mouth every 8 (eight) hours as needed.  . Insulin Syringe-Needle U-100 (BD INSULIN SYRINGE U/F) 31G X 5/16" 1 ML MISC Use twice a day  . ipratropium (ATROVENT) 0.06 % nasal spray Place 2 sprays into the nose 3 (three) times daily.  Marland Kitchen levocetirizine (XYZAL) 5 MG tablet Take 1 tablet Daily for Allergies  . lisdexamfetamine (VYVANSE) 20 MG capsule Take 1 capsule (20 mg total) by mouth daily.  Marland Kitchen losartan (COZAAR) 100 MG tablet TAKE 1 TABLET DAILY FOR BP & DIABETIC KIDNEY PROTECTION  . metFORMIN (GLUCOPHAGE-XR) 750 MG 24 hr tablet Take 1 tablet 2 x /day with Meals for Diabetes  . NOVOLIN 70/30 (70-30) 100 UNIT/ML injection INJECT 25 UNITS IN THE AM AND 20 UNITS AT DINNER  . olopatadine (PATANOL) 0.1 % ophthalmic solution PLACE 1 DROP INTO BOTH EYES 2 (TWO) TIMES DAILY.  . rosuvastatin (CRESTOR) 20 MG tablet Take 1 tablet daily for Cholesterol  . tiZANidine (ZANAFLEX) 2 MG tablet TAKE 1 TABLET (2 MG TOTAL) BY MOUTH EVERY 6 (SIX) HOURS AS NEEDED FOR MUSCLE SPASMS.  Marland Kitchen traMADol (ULTRAM) 50 MG tablet Take 1 tablet (50 mg total) by mouth every 6 (six) hours as needed.   No current facility-administered medications  on file prior to visit.     Allergies:  Allergies  Allergen Reactions  . Tape Rash  . Ace Inhibitors Cough  . Metformin And Related     myalgia  . Morphine And Related Itching  . Penicillins     Pt states that in 3rd grade she was given an injection in the buttock and reacted with a large hive in that area Pt can take oral penicillin with no reaction    . Wellbutrin [Bupropion]     Felt funny     Medical History:  Past Medical History:  Diagnosis Date  . ADD (attention deficit disorder)   . Allergy   . Anxiety   . Arthritis   . Asthma    with severe allergies pt does use albuterol inhaler when needed  . Cataract   . Depression   . Diabetes mellitus without complication ([place]) 2725   TYPE 2   . GERD (gastroesophageal reflux disease)  hx of had Nissen Fundoplication 3664 has not had any issues since  . History of hiatal hernia   . Hyperlipidemia 2004  . Hypertension   . IBS (irritable bowel syndrome)   . Lynch syndrome   . Tubular adenoma of colon    Family history- Reviewed and unchanged Social history- Reviewed and unchanged   Review of Systems:  Review of Systems  Constitutional: Negative for malaise/fatigue and weight loss.  HENT: Negative for hearing loss and tinnitus.   Eyes: Negative for blurred vision and double vision.  Respiratory: Negative for cough, shortness of breath and wheezing.   Cardiovascular: Negative for chest pain, palpitations, orthopnea, claudication and leg swelling.  Gastrointestinal: Negative for abdominal pain, blood in stool, constipation, diarrhea, heartburn, melena, nausea and vomiting.  Genitourinary: Negative.   Musculoskeletal: Negative for joint pain and myalgias.  Skin: Negative for rash.  Neurological: Negative for dizziness, tingling, sensory change, weakness and headaches.  Endo/Heme/Allergies: Negative for polydipsia.  Psychiatric/Behavioral: Positive for depression. The patient is nervous/anxious and has insomnia.    All other systems reviewed and are negative.   Physical Exam: LMP  (LMP Unknown)  Wt Readings from Last 3 Encounters:  06/19/19 172 lb 6.4 oz (78.2 kg)  01/26/19 167 lb (75.8 kg)  01/16/19 167 lb (75.8 kg)   General Appearance: Well nourished, in no apparent distress. Eyes: PERRLA, EOMs, conjunctiva no swelling or erythema Sinuses: No Frontal/maxillary tenderness ENT/Mouth: Ext aud canals clear, TMs without erythema, bulging. No erythema, swelling, or exudate on post pharynx.  Tonsils not swollen or erythematous. Hearing normal.  Neck: Supple, thyroid normal.  Respiratory: Respiratory effort normal, BS equal bilaterally without rales, rhonchi, wheezing or stridor.  Cardio: sinus tachy with no MRGs. Brisk peripheral pulses without edema.  Abdomen: Soft, + BS.  Non tender, no guarding, rebound, hernias, masses. Lymphatics: Non tender without lymphadenopathy.  Musculoskeletal: Full ROM, 5/5 strength, Normal gait Skin: Warm, dry without rashes, lesions, ecchymosis.  Neuro: Cranial nerves intact. No cerebellar symptoms.  Psych: Awake and oriented X 3, normal affect, Insight and Judgment appropriate.    Vicie Mutters, PA-C 8:18 PM [facility]

## 2019-10-12 ENCOUNTER — Ambulatory Visit: Payer: 59 | Admitting: Physician Assistant

## 2019-10-22 ENCOUNTER — Other Ambulatory Visit: Payer: Self-pay | Admitting: Internal Medicine

## 2019-10-22 DIAGNOSIS — F330 Major depressive disorder, recurrent, mild: Secondary | ICD-10-CM

## 2019-10-22 DIAGNOSIS — E785 Hyperlipidemia, unspecified: Secondary | ICD-10-CM

## 2019-11-01 ENCOUNTER — Other Ambulatory Visit: Payer: Self-pay | Admitting: Physician Assistant

## 2019-11-01 DIAGNOSIS — F988 Other specified behavioral and emotional disorders with onset usually occurring in childhood and adolescence: Secondary | ICD-10-CM

## 2019-11-01 MED ORDER — LISDEXAMFETAMINE DIMESYLATE 20 MG PO CAPS: 20.0000 mg | ORAL_CAPSULE | Freq: Every day | ORAL | 0 refills | Status: DC

## 2019-11-07 NOTE — Progress Notes (Signed)
FOLLOW UP  Assessment and Plan:   HA with left sided tinnitus Declines work up with CTA or MRI to rule out aneurysm Will increase losartan to 100mg  Get cataract surgery  And if continue to have HA will get imaging Will go to the ER if worsening headache, changes vision/speech, imbalance, weakness.  Hypertension Will increase the losartan to 100 mg Monitor blood pressure at home; patient to call if consistently greater than 130/80 Continue DASH diet.   Reminder to go to the ER if any CP, SOB, nausea, dizziness, severe HA, changes vision/speech, left arm numbness and tingling and jaw pain.  Cholesterol Currently near goal; currently taking 10 mg rosuvastatin every other day- never increased as she was advised to do Continue low cholesterol diet and exercise.  Check lipid panel.   Diabetes without complications Continue insulin and metformin Check A1C Not seeing endocrinology anymore  Overweight Long discussion about weight loss, diet, and exercise Recommended diet heavy in fruits and veggies and low in animal meats, cheeses, and dairy products, appropriate calorie intake Discussed ideal weight for height Will follow up in 3 months  Vitamin D Def Below goal at last visit; taking 5000 IU daily  check Vit D level  Depression/anxiety Continue medications  Lifestyle discussed: diet/exerise, sleep hygiene, stress management, hydration   Continue diet and meds as discussed. Further disposition pending results of labs. Discussed med's effects and SE's.   Over 30 minutes of exam, counseling, chart review, and critical decision making was performed.   Future Appointments  Date Time Provider [place]  06/18/2020  2:00 PM Vicie Mutters, PA-C [facility] None    ----------------------------------------------------------------------------------------------------------------------  HPI 64 y.o. female  presents for 3 month follow up on hypertension, cholesterol,  diabetes, weight and vitamin D deficiency.   She has had a mild low HA x 1 month, has pulsatile tinnitus in left ear.  She states she is always tired, normal B12 and iron in June. She has history of normal sleep study long time ago. She states she does not sleep well.   BMI is Body mass index is 28.62 kg/m., she has been working on diet, watches her grandson but otherwise not exercise.   Wt Readings from Last 3 Encounters:  11/08/19 172 lb (78 kg)  06/19/19 172 lb 6.4 oz (78.2 kg)  01/26/19 167 lb (75.8 kg)   Her blood pressure has been controlled at home, today their BP is BP: 120/70  She does not workout, but watches her 45 y/o grandchild and consequently very acitve She denies chest pain, shortness of breath, dizziness.   She has been working on diet and exercise for T2DM  With CKD on ARB Losartan 100 Hyperlipiemia on Crestor 20 mg at goal of  70. She is on 70/30 15 in the AM and 10 at night.  On metoformin 750 BID She could not tolerate the ozepmic, had reaction site issues with trulicity but tolerated well and not itchy or bad and denies foot ulcerations, increased appetite, nausea, paresthesia of the feet and visual disturbances.  Her sugars have been running high with stress from her mom being in the hospital, no low sugars.  [place] meter  Last A1C in the office was:  Lab Results  Component Value Date   HGBA1C 9.9 (H) 06/19/2019   Lab Results  Component Value Date   CHOL 136 06/19/2019   HDL 60 06/19/2019   LDLCALC 55 06/19/2019   TRIG 124 06/19/2019   CHOLHDL 2.3 06/19/2019    Lab  Results  Component Value Date   GFRNONAA 80 06/19/2019    Patient is not on Vitamin D supplement.   Lab Results  Component Value Date   VD25OH 66 06/19/2019       Current Medications:  Current Outpatient Medications on File Prior to Visit  Medication Sig  . acetaminophen (TYLENOL) 500 MG tablet Take 1,000 mg by mouth daily as needed for moderate pain or headache.  . albuterol  (PROVENTIL HFA;VENTOLIN HFA) 108 (90 Base) MCG/ACT inhaler  Use 1 to 2 Inhalations 15 minutes apart every 4 hours to Rescue Asthma (Patient taking differently: as needed. )  . ALPRAZolam (XANAX) 0.5 MG tablet Take 1 tablet (0.5 mg total) by mouth 2 (two) times daily as needed.  Marland Kitchen amitriptyline (ELAVIL) 50 MG tablet Take 1 tablet at Bedtime as needed for Sleep  . aspirin 81 MG tablet Take 81 mg by mouth daily.  . CHOLECALCIFEROL PO Take 5,000 Units by mouth daily.  Marland Kitchen escitalopram (LEXAPRO) 20 MG tablet TAKE 1 TABLET BY MOUTH DAILY FOR MOOD  . fluticasone (FLONASE) 50 MCG/ACT nasal spray Place 2 sprays into both nostrils daily.  Marland Kitchen ibuprofen (ADVIL,MOTRIN) 800 MG tablet Take 1 tablet (800 mg total) by mouth every 8 (eight) hours as needed.  . Insulin Syringe-Needle U-100 (BD INSULIN SYRINGE U/F) 31G X 5/16" 1 ML MISC Use twice a day  . ipratropium (ATROVENT) 0.06 % nasal spray Place 2 sprays into the nose 3 (three) times daily.  Marland Kitchen levocetirizine (XYZAL) 5 MG tablet Take 1 tablet Daily for Allergies  . lisdexamfetamine (VYVANSE) 20 MG capsule Take 1 capsule (20 mg total) by mouth daily.  Marland Kitchen losartan (COZAAR) 100 MG tablet TAKE 1 TABLET DAILY FOR BP & DIABETIC KIDNEY PROTECTION  . metFORMIN (GLUCOPHAGE-XR) 750 MG 24 hr tablet Take 1 tablet 2 x /day with Meals for Diabetes  . NOVOLIN 70/30 (70-30) 100 UNIT/ML injection INJECT 25 UNITS IN THE AM AND 20 UNITS AT DINNER  . olopatadine (PATANOL) 0.1 % ophthalmic solution PLACE 1 DROP INTO BOTH EYES 2 (TWO) TIMES DAILY.  . rosuvastatin (CRESTOR) 20 MG tablet TAKE 1 TABLET BY MOUTH EVERY DAY FOR CHOLESTEROL  . tiZANidine (ZANAFLEX) 2 MG tablet TAKE 1 TABLET (2 MG TOTAL) BY MOUTH EVERY 6 (SIX) HOURS AS NEEDED FOR MUSCLE SPASMS.  Marland Kitchen traMADol (ULTRAM) 50 MG tablet Take 1 tablet (50 mg total) by mouth every 6 (six) hours as needed.   No current facility-administered medications on file prior to visit.     Allergies:  Allergies  Allergen Reactions  . Tape  Rash  . Ace Inhibitors Cough  . Metformin And Related     myalgia  . Morphine And Related Itching  . Penicillins     Pt states that in 3rd grade she was given an injection in the buttock and reacted with a large hive in that area Pt can take oral penicillin with no reaction    . Wellbutrin [Bupropion]     Felt funny     Medical History:  Past Medical History:  Diagnosis Date  . ADD (attention deficit disorder)   . Allergy   . Anxiety   . Arthritis   . Asthma    with severe allergies pt does use albuterol inhaler when needed  . Cataract   . Depression   . Diabetes mellitus without complication ([place]) 2694   TYPE 2   . GERD (gastroesophageal reflux disease)    hx of had Nissen Fundoplication 8546 has not had  any issues since  . History of hiatal hernia   . Hyperlipidemia 2004  . Hypertension   . IBS (irritable bowel syndrome)   . Lynch syndrome   . Tubular adenoma of colon    Family history- Reviewed and unchanged Social history- Reviewed and unchanged   Review of Systems:  Review of Systems  Constitutional: Negative for malaise/fatigue and weight loss.  HENT: Negative for hearing loss and tinnitus.   Eyes: Negative for blurred vision and double vision.  Respiratory: Negative for cough, shortness of breath and wheezing.   Cardiovascular: Negative for chest pain, palpitations, orthopnea, claudication and leg swelling.  Gastrointestinal: Negative for abdominal pain, blood in stool, constipation, diarrhea, heartburn, melena, nausea and vomiting.  Genitourinary: Negative.   Musculoskeletal: Negative for joint pain and myalgias.  Skin: Negative for rash.  Neurological: Negative for dizziness, tingling, sensory change, weakness and headaches.  Endo/Heme/Allergies: Negative for polydipsia.  Psychiatric/Behavioral: Positive for depression. The patient is nervous/anxious and has insomnia.   All other systems reviewed and are negative.   Physical Exam: BP 120/70   Pulse  100   Temp (!) 97.2 F (36.2 C)   Ht 5\' 5"  (1.651 m)   Wt 172 lb (78 kg)   LMP  (LMP Unknown)   SpO2 95%   BMI 28.62 kg/m  Wt Readings from Last 3 Encounters:  11/08/19 172 lb (78 kg)  06/19/19 172 lb 6.4 oz (78.2 kg)  01/26/19 167 lb (75.8 kg)   General Appearance: Well nourished, in no apparent distress. Eyes: PERRLA, EOMs, conjunctiva no swelling or erythema Sinuses: No Frontal/maxillary tenderness ENT/Mouth: Ext aud canals clear, TMs without erythema, bulging. No erythema, swelling, or exudate on post pharynx.  Tonsils not swollen or erythematous. Hearing normal.  Neck: Supple, thyroid normal.  Respiratory: Respiratory effort normal, BS equal bilaterally without rales, rhonchi, wheezing or stridor.  Cardio: sinus tachy with no MRGs. Brisk peripheral pulses without edema.  Abdomen: Soft, + BS.  Non tender, no guarding, rebound, hernias, masses. Lymphatics: Non tender without lymphadenopathy.  Musculoskeletal: Full ROM, 5/5 strength, Normal gait Skin: Warm, dry without rashes, lesions, ecchymosis.  Neuro: Cranial nerves intact. No cerebellar symptoms.  Psych: Awake and oriented X 3, normal affect, Insight and Judgment appropriate.    Vicie Mutters, PA-C 10:46 AM [facility]

## 2019-11-08 ENCOUNTER — Other Ambulatory Visit: Payer: Self-pay

## 2019-11-08 ENCOUNTER — Encounter: Payer: Self-pay | Admitting: Physician Assistant

## 2019-11-08 ENCOUNTER — Ambulatory Visit (INDEPENDENT_AMBULATORY_CARE_PROVIDER_SITE_OTHER): Payer: 59 | Admitting: Physician Assistant

## 2019-11-08 VITALS — BP 120/70 | HR 100 | Temp 97.2°F | Ht 65.0 in | Wt 172.0 lb

## 2019-11-08 DIAGNOSIS — I10 Essential (primary) hypertension: Secondary | ICD-10-CM | POA: Diagnosis not present

## 2019-11-08 DIAGNOSIS — Z79899 Other long term (current) drug therapy: Secondary | ICD-10-CM | POA: Diagnosis not present

## 2019-11-08 DIAGNOSIS — E785 Hyperlipidemia, unspecified: Secondary | ICD-10-CM

## 2019-11-08 DIAGNOSIS — E1165 Type 2 diabetes mellitus with hyperglycemia: Secondary | ICD-10-CM | POA: Diagnosis not present

## 2019-11-08 DIAGNOSIS — E1169 Type 2 diabetes mellitus with other specified complication: Secondary | ICD-10-CM

## 2019-11-08 DIAGNOSIS — E559 Vitamin D deficiency, unspecified: Secondary | ICD-10-CM

## 2019-11-08 DIAGNOSIS — Z1509 Genetic susceptibility to other malignant neoplasm: Secondary | ICD-10-CM | POA: Diagnosis not present

## 2019-11-08 DIAGNOSIS — Z794 Long term (current) use of insulin: Secondary | ICD-10-CM

## 2019-11-08 NOTE — Patient Instructions (Addendum)
If your sugars are still where they are at we can increase the insulin or add back on trulicity.   I personally suggest the trulicity once a week since it also has weight loss and helps prevent heart attack and stroke.      Bad carbs also include fruit juice, alcohol, and sweet tea. These are empty calories that do not signal to your brain that you are full.   Please remember the good carbs are still carbs which convert into sugar. So please measure them out no more than 1/2-1 cup of rice, oatmeal, pasta, and beans  Veggies are however free foods! Pile them on.   Not all fruit is created equal. Please see the list below, the fruit at the bottom is higher in sugars than the fruit at the top. Please avoid all dried fruits.      Start TRULICITY injection as shown once a week. The starting dose is 0.75 mg on the pen for the first 2 weeks.  You may inject in the stomach, thigh or arm. You may experience nausea in the first few days which usually goes away.   You will feel fullness of the stomach with starting the medication and should try to keep the portions at meals small.  After 2 weeks increase the dose to 1.5mg  daily if no nausea present.    If any questions or concerns are present call the office  Please check blood sugars at least half the time about 2 hours after any meal and 3 times per week on waking up. Please bring blood sugar monitor to each visit. Recommended blood sugar levels about 2 hours after meal is 140-180 and on waking up 90-130  VARICOSE VEINS Varicose veins are veins that have become enlarged and twisted. CAUSES This condition is the result of valves in the veins not working properly. Valves in the veins help return blood from the leg to the heart. When your calf muscles squeeze, the blood moves up your leg then the valves close and this continues until the blood gets back to your heart.  If these valves are damaged, blood flows backwards and backs up into the veins in  the leg near the skin OR if your are sitting/standing for a long time without using your calf muscles the blood will back up into the veins in your legs. This causes the veins to become larger. People who are on their feet a lot, sit a lot without walking (like on a plane, at a desk, or in a car), who are pregnant, or who are overweight are more likely to develop varicose veins. SYMPTOMS   Bulging, twisted-appearing, bluish veins, most commonly found on the legs.  Leg pain or a feeling of heaviness. These symptoms may be worse at the end of the day.  Leg swelling.  Skin color changes. DIAGNOSIS  Varicose veins can usually be diagnosed with an exam of your legs by your caregiver. He or she may recommend an ultrasound of your leg veins. TREATMENT  Most varicose veins can be treated at home. However, other treatments are available for people who have persistent symptoms or who want to treat the cosmetic appearance of the varicose veins. But this is only cosmetic and they will return if not properly treated. These include:  Laser treatment of very small varicose veins.  Medicine that is shot (injected) into the vein. This medicine hardens the walls of the vein and closes off the vein. This treatment is called sclerotherapy.  Afterwards, you may need to wear clothing or bandages that apply pressure.  Surgery. HOME CARE INSTRUCTIONS   Do not stand or sit in one position for long periods of time. Do not sit with your legs crossed. Rest with your legs raised during the day.  Your legs have to be higher than your heart so that gravity will force the valves to open, so please really elevate your legs.   Wear elastic stockings or support hose. Do not wear other tight, encircling garments around the legs, pelvis, or waist.  [employer]  has a wide variety of well priced compression stockings. [place], [place] [address] [phone]  OR THERE ARE COPPER INFUSED COMPRESSION SOCKS  AT [facility] OR [facility]  [employer] also has great cheap/afforable stockings or socks- the socks are easier to get on your feet  - can also get a jacob's donner that helps you put on the sock  Walk as much as possible to increase blood flow.  Raise the foot of your bed at night with 2-inch blocks.  If you get a cut in the skin over the vein and the vein bleeds, lie down with your leg raised and press on it with a clean cloth until the bleeding stops. Then place a bandage (dressing) on the cut. See your caregiver if it continues to bleed or needs stitches. SEEK MEDICAL CARE IF:   The skin around your ankle starts to break down.  You have pain, redness, tenderness, or hard swelling developing in your leg over a vein.  You are uncomfortable due to leg pain. Document Released: 12/31/2004 Document Revised: 06/15/2011 Document Reviewed: 05/19/2010 [facility] Patient Information 2014 [place].

## 2019-11-09 LAB — HEMOGLOBIN A1C
Hgb A1c MFr Bld: 10.7 % of total Hgb — ABNORMAL HIGH (ref <5.7)
Mean Plasma Glucose: 260 (calc)
eAG (mmol/L): 14.4 (calc)

## 2019-11-09 LAB — CBC WITH DIFFERENTIAL/PLATELET
Absolute Monocytes: 415 cells/uL (ref 200–950)
Basophils Absolute: 133 cells/uL (ref 0–200)
Basophils Relative: 1.6 %
Eosinophils Absolute: 332 cells/uL (ref 15–500)
Eosinophils Relative: 4 %
HCT: 45.6 % — ABNORMAL HIGH (ref 35.0–45.0)
Hemoglobin: 14.4 g/dL (ref 11.7–15.5)
Lymphs Abs: 2664 cells/uL (ref 850–3900)
MCH: 27.6 pg (ref 27.0–33.0)
MCHC: 31.6 g/dL — ABNORMAL LOW (ref 32.0–36.0)
MCV: 87.4 fL (ref 80.0–100.0)
MPV: 11.3 fL (ref 7.5–12.5)
Monocytes Relative: 5 %
Neutro Abs: 4756 cells/uL (ref 1500–7800)
Neutrophils Relative %: 57.3 %
Platelets: 366 10*3/uL (ref 140–400)
RBC: 5.22 10*6/uL — ABNORMAL HIGH (ref 3.80–5.10)
RDW: 12.2 % (ref 11.0–15.0)
Total Lymphocyte: 32.1 %
WBC: 8.3 10*3/uL (ref 3.8–10.8)

## 2019-11-09 LAB — LIPID PANEL
Cholesterol: 132 mg/dL (ref <200)
HDL: 52 mg/dL (ref >=50)
LDL Cholesterol (Calc): 57 mg/dL (calc)
Non-HDL Cholesterol (Calc): 80 mg/dL (calc) (ref <130)
Total CHOL/HDL Ratio: 2.5 (calc) (ref <5.0)
Triglycerides: 148 mg/dL (ref <150)

## 2019-11-09 LAB — COMPLETE METABOLIC PANEL WITH GFR
AG Ratio: 1.4 (calc) (ref 1.0–2.5)
ALT: 16 U/L (ref 6–29)
AST: 17 U/L (ref 10–35)
Albumin: 4.2 g/dL (ref 3.6–5.1)
Alkaline phosphatase (APISO): 88 U/L (ref 37–153)
BUN: 15 mg/dL (ref 7–25)
CO2: 24 mmol/L (ref 20–32)
Calcium: 9.9 mg/dL (ref 8.6–10.4)
Chloride: 100 mmol/L (ref 98–110)
Creat: 0.97 mg/dL (ref 0.50–0.99)
GFR, Est African American: 72 mL/min/{1.73_m2} (ref >=60)
GFR, Est Non African American: 62 mL/min/{1.73_m2} (ref >=60)
Globulin: 2.9 g/dL (calc) (ref 1.9–3.7)
Glucose, Bld: 459 mg/dL — ABNORMAL HIGH (ref 65–99)
Potassium: 4.3 mmol/L (ref 3.5–5.3)
Sodium: 134 mmol/L — ABNORMAL LOW (ref 135–146)
Total Bilirubin: 0.7 mg/dL (ref 0.2–1.2)
Total Protein: 7.1 g/dL (ref 6.1–8.1)

## 2019-11-09 LAB — VITAMIN D 25 HYDROXY (VIT D DEFICIENCY, FRACTURES): Vit D, 25-Hydroxy: 43 ng/mL (ref 30–100)

## 2019-11-09 LAB — MAGNESIUM: Magnesium: 1.7 mg/dL (ref 1.5–2.5)

## 2019-11-09 LAB — TSH: TSH: 2.75 mIU/L (ref 0.40–4.50)

## 2019-11-24 ENCOUNTER — Telehealth: Payer: Self-pay

## 2019-11-24 NOTE — Telephone Encounter (Signed)
Patient has picked up SAMPLES of TRULICITY 2 boxes 1.5mg /0.5 ml & has scheduled her 1 mth DM teaching. Aug 20th 2021 at 10am

## 2020-01-04 NOTE — Progress Notes (Signed)
FOLLOW UP  Assessment and Plan:  Type 2 diabetes mellitus with hyperglycemia, with long-term current use of insulin ([place]) -     Ambulatory referral to Endocrinology Will continue on the trulicity low dose, likely more injection site reaction. Will use ice/warm compresses, take trulicity out to room temp, can take ibuprofen. She will stop if any  spreading rash/hives, go to the ER if you have any swelling in your tongues, throat, fever, chills, flushing.  Will refer to endocrinology to see if this is okay to continue trulicity and for better sugar control.  -     Dulaglutide (TRULICITY) 4.40 NU/2.7OZ SOPN; Inject 0.75 mg into the skin once a week. ? Set up visits for her seeing endo and [place], rotating every 6 months.   Flu vaccine need -     FLU VACCINE MDCK QUAD W/Preservative   Continue diet and meds as discussed. Further disposition pending results of labs. Discussed med's effects and SE's.   Over 30 minutes of exam, counseling, chart review, and critical decision making was performed.   Future Appointments  Date Time Provider [place]  02/15/2020 11:00 AM Garnet Sierras, NP [facility] None  06/18/2020  2:00 PM [place]anahan, Danton Sewer, NP [facility] None    ----------------------------------------------------------------------------------------------------------------------  HPI 64 y.o. female  presents for 1 month follow up on her diabetes. She was started on trulicity last visit.    She has been working on diet and exercise for T2DM  With CKD on ARB Losartan 100 Hyperlipiemia on Crestor 20 mg at goal of 70. Negative 2007 cardiolite She is on 70/30 15 in the AM and 10 at night.  Started on trulicity last visit,  (could not tolerate ozempic due to constipation/nausea) [place] she has had a local skin reactions at the site of injection, no swelling in throat, no flushing, no fever, chills, no tongue swelling or signs of allergic reaction.  On metoformin 750  BID Farxiga gave yeast infection  denies foot ulcerations, increased appetite, nausea, paresthesia of the feet and visual disturbances.  Her sugars have been running high with stress from her mom being in the hospital with carcinoid, she takes care of her husband and feels she is always seeing doctors, no low sugars.  [place] meter  Last A1C in the office was:  Lab Results  Component Value Date   HGBA1C 10.7 (H) 11/08/2019   Lab Results  Component Value Date   CHOL 132 11/08/2019   HDL 52 11/08/2019   LDLCALC 57 11/08/2019   TRIG 148 11/08/2019   CHOLHDL 2.5 11/08/2019    Lab Results  Component Value Date   GFRNONAA 62 11/08/2019   BMI is Body mass index is 28.46 kg/m., she is working on diet and exercise. Wt Readings from Last 3 Encounters:  01/08/20 171 lb (77.6 kg)  11/08/19 172 lb (78 kg)  06/19/19 172 lb 6.4 oz (78.2 kg)     Current Medications:   Current Outpatient Medications (Endocrine & Metabolic):  .  metFORMIN (GLUCOPHAGE-XR) 750 MG 24 hr tablet, Take 1 tablet 2 x /day with Meals for Diabetes .  NOVOLIN 70/30 (70-30) 100 UNIT/ML injection, INJECT 25 UNITS IN THE AM AND 20 UNITS AT DINNER .  Dulaglutide (TRULICITY) 3.66 YQ/0.3KV SOPN, Inject 0.75 mg into the skin once a week.  Current Outpatient Medications (Cardiovascular):  .  losartan (COZAAR) 100 MG tablet, TAKE 1 TABLET DAILY FOR BP & DIABETIC KIDNEY PROTECTION .  rosuvastatin (CRESTOR) 20 MG tablet, TAKE 1 TABLET BY  MOUTH EVERY DAY FOR CHOLESTEROL  Current Outpatient Medications (Respiratory):  .  albuterol (PROVENTIL HFA;VENTOLIN HFA) 108 (90 Base) MCG/ACT inhaler,  Use 1 to 2 Inhalations 15 minutes apart every 4 hours to Rescue Asthma (Patient taking differently: as needed. ) .  fluticasone (FLONASE) 50 MCG/ACT nasal spray, Place 2 sprays into both nostrils daily. Marland Kitchen  ipratropium (ATROVENT) 0.06 % nasal spray, Place 2 sprays into the nose 3 (three) times daily. Marland Kitchen  levocetirizine (XYZAL) 5 MG tablet, Take  1 tablet Daily for Allergies  Current Outpatient Medications (Analgesics):  .  acetaminophen (TYLENOL) 500 MG tablet, Take 1,000 mg by mouth daily as needed for moderate pain or headache. Marland Kitchen  aspirin 81 MG tablet, Take 81 mg by mouth daily. Marland Kitchen  ibuprofen (ADVIL,MOTRIN) 800 MG tablet, Take 1 tablet (800 mg total) by mouth every 8 (eight) hours as needed. .  traMADol (ULTRAM) 50 MG tablet, Take 1 tablet (50 mg total) by mouth every 6 (six) hours as needed.   Current Outpatient Medications (Other):  Marland Kitchen  ALPRAZolam (XANAX) 0.5 MG tablet, Take 1 tablet (0.5 mg total) by mouth 2 (two) times daily as needed. Marland Kitchen  amitriptyline (ELAVIL) 50 MG tablet, Take 1 tablet at Bedtime as needed for Sleep .  CHOLECALCIFEROL PO, Take 5,000 Units by mouth daily. Marland Kitchen  escitalopram (LEXAPRO) 20 MG tablet, TAKE 1 TABLET BY MOUTH DAILY FOR MOOD .  Insulin Syringe-Needle U-100 (BD INSULIN SYRINGE U/F) 31G X 5/16" 1 ML MISC, Use twice a day .  lisdexamfetamine (VYVANSE) 20 MG capsule, Take 1 capsule (20 mg total) by mouth daily. Marland Kitchen  olopatadine (PATANOL) 0.1 % ophthalmic solution, PLACE 1 DROP INTO BOTH EYES 2 (TWO) TIMES DAILY. Marland Kitchen  tiZANidine (ZANAFLEX) 2 MG tablet, TAKE 1 TABLET (2 MG TOTAL) BY MOUTH EVERY 6 (SIX) HOURS AS NEEDED FOR MUSCLE SPASMS.   Allergies:  Allergies  Allergen Reactions  . Tape Rash  . Ace Inhibitors Cough  . Metformin And Related     myalgia  . Morphine And Related Itching  . Penicillins     Pt states that in 3rd grade she was given an injection in the buttock and reacted with a large hive in that area Pt can take oral penicillin with no reaction    . Wellbutrin [Bupropion]     Felt funny     Medical History:  Past Medical History:  Diagnosis Date  . ADD (attention deficit disorder)   . Allergy   . Anxiety   . Arthritis   . Asthma    with severe allergies pt does use albuterol inhaler when needed  . Cataract   . Depression   . Diabetes mellitus without complication ([place]) 4098    TYPE 2   . GERD (gastroesophageal reflux disease)    hx of had Nissen Fundoplication 1191 has not had any issues since  . History of hiatal hernia   . Hyperlipidemia 2004  . Hypertension   . IBS (irritable bowel syndrome)   . Lynch syndrome   . Tubular adenoma of colon    Family history- Reviewed and unchanged Social history- Reviewed and unchanged   Review of Systems:  Review of Systems  Constitutional: Negative for malaise/fatigue and weight loss.  HENT: Negative for hearing loss and tinnitus.   Eyes: Negative for blurred vision and double vision.  Respiratory: Negative for cough, shortness of breath and wheezing.   Cardiovascular: Negative for chest pain, palpitations, orthopnea, claudication and leg swelling.  Gastrointestinal: Negative for abdominal pain,  blood in stool, constipation, diarrhea, heartburn, melena, nausea and vomiting.  Genitourinary: Negative.   Musculoskeletal: Negative for joint pain and myalgias.  Skin: Negative for rash.  Neurological: Negative for dizziness, tingling, sensory change, weakness and headaches.  Endo/Heme/Allergies: Negative for polydipsia.  Psychiatric/Behavioral: Positive for depression. The patient is nervous/anxious and has insomnia.   All other systems reviewed and are negative.   Physical Exam: BP 122/74   Pulse 74   Temp 97.6 F (36.4 C)   Wt 171 lb (77.6 kg)   LMP  (LMP Unknown)   SpO2 95%   BMI 28.46 kg/m  Wt Readings from Last 3 Encounters:  01/08/20 171 lb (77.6 kg)  11/08/19 172 lb (78 kg)  06/19/19 172 lb 6.4 oz (78.2 kg)   General Appearance: Well nourished, in no apparent distress. Eyes: PERRLA, EOMs, conjunctiva no swelling or erythema Sinuses: No Frontal/maxillary tenderness ENT/Mouth: Ext aud canals clear, TMs without erythema, bulging. No erythema, swelling, or exudate on post pharynx.  Tonsils not swollen or erythematous. Hearing normal.  Neck: Supple, thyroid normal.  Respiratory: Respiratory effort normal,  BS equal bilaterally without rales, rhonchi, wheezing or stridor.  Cardio: sinus tachy with no MRGs. Brisk peripheral pulses without edema.  Abdomen: Soft, + BS.  Non tender, no guarding, rebound, hernias, masses. Lymphatics: Non tender without lymphadenopathy.  Musculoskeletal: Full ROM, 5/5 strength, Normal gait Skin: Warm, dry without rashes, lesions, ecchymosis.  Neuro: Cranial nerves intact. No cerebellar symptoms.  Psych: Awake and oriented X 3, normal affect, Insight and Judgment appropriate.    Vicie Mutters, PA-C 12:08 PM [facility]

## 2020-01-08 ENCOUNTER — Encounter: Payer: Self-pay | Admitting: Physician Assistant

## 2020-01-08 ENCOUNTER — Ambulatory Visit (INDEPENDENT_AMBULATORY_CARE_PROVIDER_SITE_OTHER): Payer: 59 | Admitting: Physician Assistant

## 2020-01-08 ENCOUNTER — Other Ambulatory Visit: Payer: Self-pay

## 2020-01-08 VITALS — BP 122/74 | HR 74 | Temp 97.6°F | Wt 171.0 lb

## 2020-01-08 DIAGNOSIS — Z794 Long term (current) use of insulin: Secondary | ICD-10-CM

## 2020-01-08 DIAGNOSIS — E1165 Type 2 diabetes mellitus with hyperglycemia: Secondary | ICD-10-CM | POA: Diagnosis not present

## 2020-01-08 DIAGNOSIS — Z23 Encounter for immunization: Secondary | ICD-10-CM | POA: Diagnosis not present

## 2020-01-08 MED ORDER — TRULICITY 0.75 MG/0.5ML ~~LOC~~ SOAJ: 0.7500 mg | SUBCUTANEOUS | 3 refills | Status: DC

## 2020-01-08 MED ORDER — METFORMIN HCL ER 750 MG PO TB24: ORAL_TABLET | ORAL | 3 refills | Status: DC

## 2020-01-08 NOTE — Patient Instructions (Addendum)
Will refer to endocrinology for better control of your sugars Suggest staying on low dose of trulicity for now Stop if you get any spreading rash/hives, go to the ER if you have any swelling in your tongues, throat, fever, chills, flushing.   Injection-site reactions (such as a rash) Some people may experience injection-site reactions while using Trulicity. However, this is not a commonly reported side effect of the drug. Injection-site reactions may include a rash, redness or other discoloration, or swelling in the area where you injected Trulicity. What might help To help avoid injection-site reactions, you should rotate injection sites with each Trulicity dose. If you experience injection-site reactions while using Trulicity, talk with your doctor or pharmacist about how to treat this side effect. They may recommend a warm compress or ice to help reduce any swelling, redness, or other discoloration. In some cases, they may recommend a medication, such as ibuprofen (Advil, Motrin), to decrease the swelling.  General eating tips  What to Avoid  Avoid added sugars o Often added sugar can be found in processed foods such as many condiments, dry cereals, cakes, cookies, chips, crisps, crackers, candies, sweetened drinks, etc.  o Read labels and AVOID/DECREASE use of foods with the following in their ingredient list: Sugar, fructose, high fructose corn syrup, sucrose, glucose, maltose, dextrose, molasses, cane sugar, [place] sugar, any type of syrup, agave nectar, etc.    Avoid snacking in between meals- drink water or if you feel you need a snack, pick a high water content snack such as cucumbers, watermelon, or any veggie.   Avoid foods made with flour o If you are going to eat food made with flour, choose those made with whole-grains; and, minimize your consumption as much as is tolerable  Avoid processed foods o These foods are generally stocked in the middle of the grocery store.  o Focus on  shopping on the perimeter of the grocery.  What to Include  Vegetables o GREEN LEAFY VEGETABLES: Kale, spinach, mustard greens, collard greens, cabbage, broccoli, etc. o OTHER: Asparagus, cauliflower, eggplant, carrots, peas, Brussel sprouts, tomatoes, bell peppers, zucchini, beets, cucumbers, etc.  Grains, seeds, and legumes o Beans: kidney beans, black eyed peas, garbanzo beans, black beans, pinto beans, etc. o Whole, unrefined grains: [place] rice, barley, bulgur, oatmeal, etc.  Healthy fats  o Avoid highly processed fats such as vegetable oil o Examples of healthy fats: avocado, olives, virgin olive oil, dark chocolate (?72% Cocoa), nuts (peanuts, almonds, walnuts, cashews, pecans, etc.) o Please still do small amount of these healthy fats, they are dense in calories.   Low - Moderate Intake of Animal Sources of Protein o Meat sources: chicken, [place], salmon, tuna. Limit to 4 ounces of meat at one time or the size of your palm. o Consider limiting dairy sources, but when choosing dairy focus on: PLAIN [place] yogurt, cottage cheese, high-protein milk  Fruit o Choose berries

## 2020-01-28 ENCOUNTER — Other Ambulatory Visit: Payer: Self-pay | Admitting: Internal Medicine

## 2020-01-29 ENCOUNTER — Encounter: Payer: Self-pay | Admitting: Endocrinology

## 2020-02-15 ENCOUNTER — Ambulatory Visit: Payer: 59 | Admitting: Adult Health Nurse Practitioner

## 2020-03-04 ENCOUNTER — Other Ambulatory Visit: Payer: Self-pay | Admitting: Adult Health Nurse Practitioner

## 2020-03-04 ENCOUNTER — Other Ambulatory Visit: Payer: Self-pay | Admitting: Internal Medicine

## 2020-03-04 DIAGNOSIS — F988 Other specified behavioral and emotional disorders with onset usually occurring in childhood and adolescence: Secondary | ICD-10-CM

## 2020-03-04 DIAGNOSIS — Z79899 Other long term (current) drug therapy: Secondary | ICD-10-CM

## 2020-03-04 MED ORDER — LISDEXAMFETAMINE DIMESYLATE 20 MG PO CAPS: 20.0000 mg | ORAL_CAPSULE | Freq: Every day | ORAL | 0 refills | Status: DC

## 2020-03-05 ENCOUNTER — Other Ambulatory Visit: Payer: Self-pay

## 2020-03-05 DIAGNOSIS — Z794 Long term (current) use of insulin: Secondary | ICD-10-CM

## 2020-03-05 DIAGNOSIS — E1165 Type 2 diabetes mellitus with hyperglycemia: Secondary | ICD-10-CM

## 2020-03-05 MED ORDER — "INSULIN SYRINGE-NEEDLE U-100 31G X 5/16"" 1 ML MISC": 2 refills | Status: DC

## 2020-03-07 ENCOUNTER — Telehealth: Payer: Self-pay | Admitting: Internal Medicine

## 2020-03-07 NOTE — Telephone Encounter (Signed)
Error

## 2020-04-07 IMAGING — US US TRANSVAGINAL NON-OB
1 series · 15 of 25 positions shown · non-contrast
Comparison: None

CLINICAL DATA: Lynch syndrome, diabetes mellitus, hypertension,
asthma

EXAM:
TRANSABDOMINAL AND TRANSVAGINAL ULTRASOUND OF PELVIS
TECHNIQUE: Both transabdominal and transvaginal ultrasound examinations of the
pelvis were performed. Transabdominal technique was performed for
global imaging of the pelvis including uterus, ovaries, adnexal
regions, and pelvic cul-de-sac. It was necessary to proceed with
endovaginal exam following the transabdominal exam to visualize the
RIGHT ovary.

[Series 1: us transvaginal non-ob · 56 acquisitions, 15 frames shown]
[im 1/56]
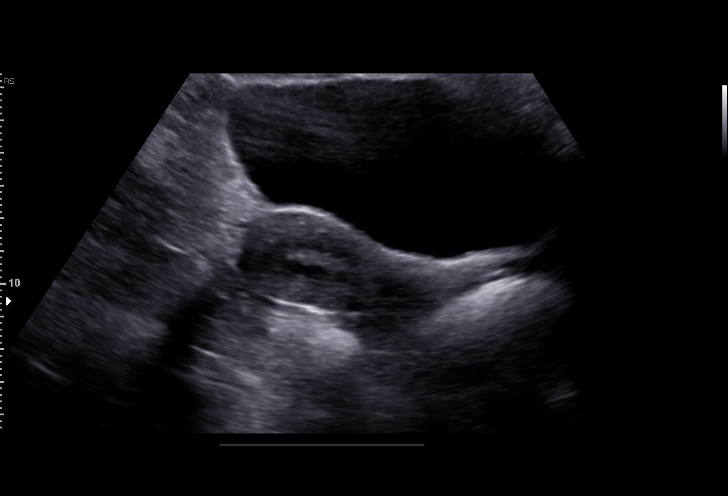
[im 5/56]
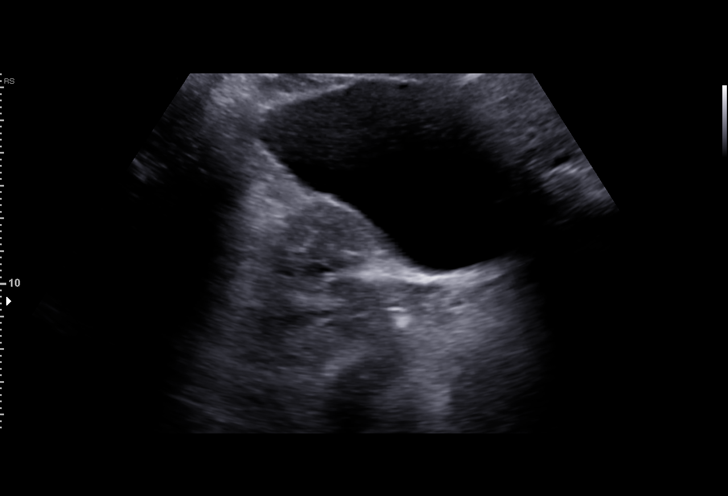
[im 10/56]
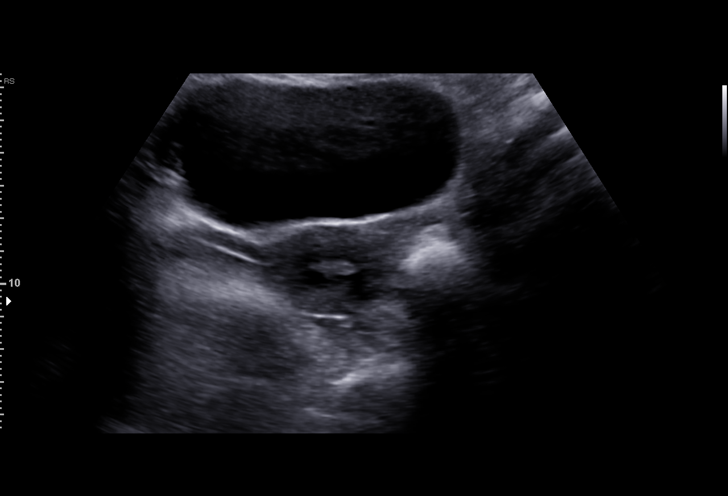
[im 12/56]
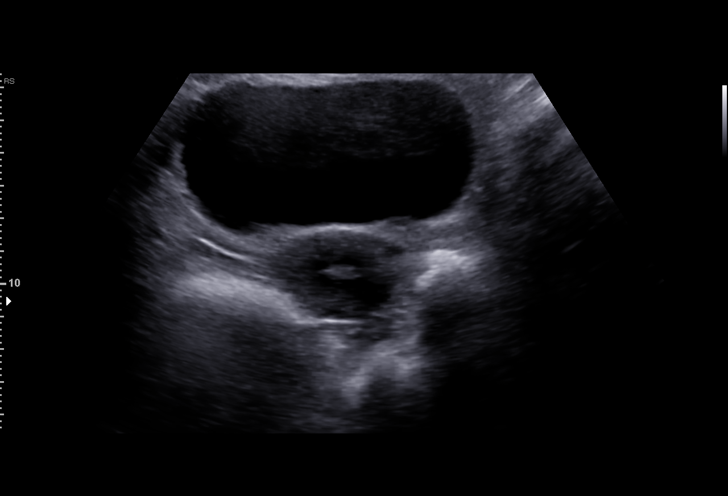
[im 17/56]
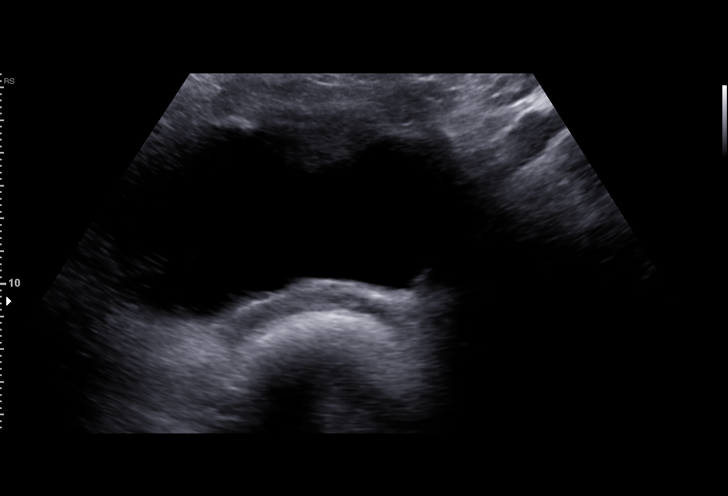
[im 21/56]
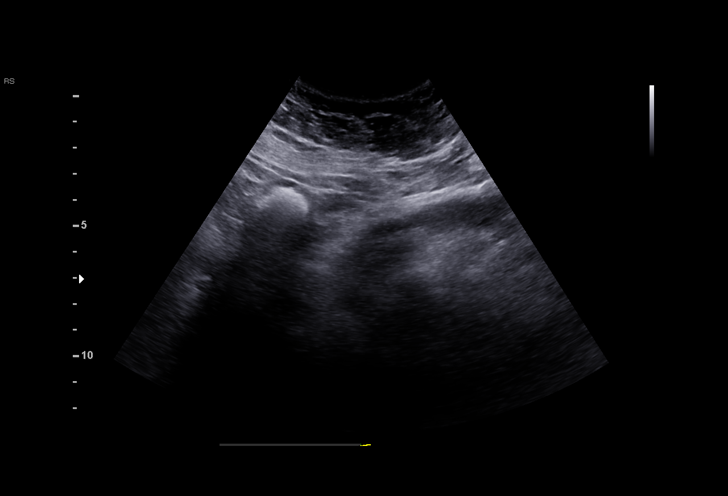
[im 23/56]
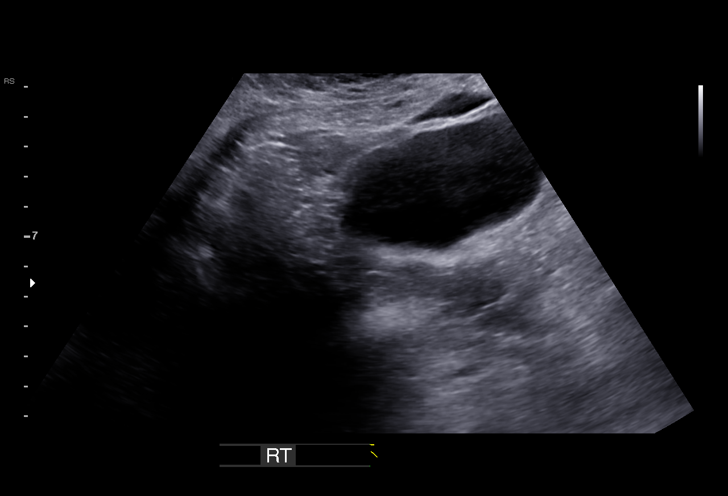
[im 28/56]
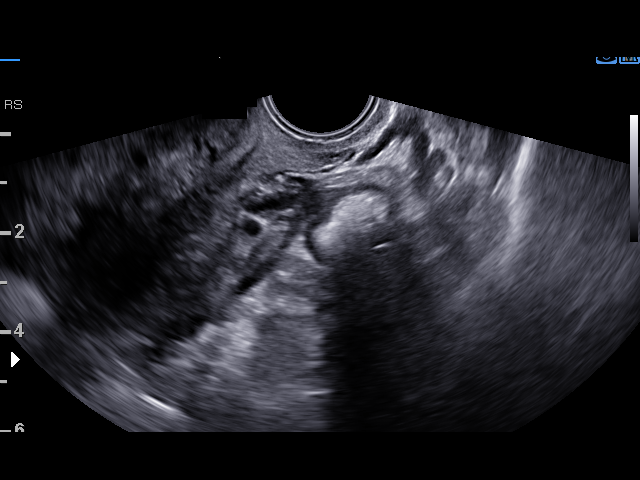
[im 33/56]
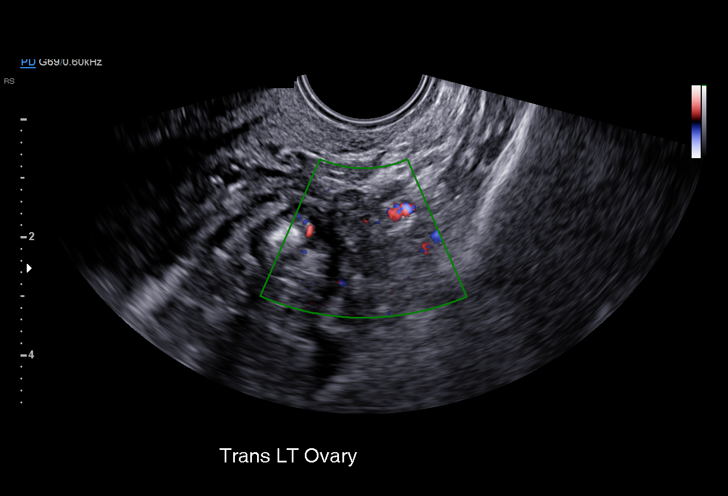
[im 35/56]
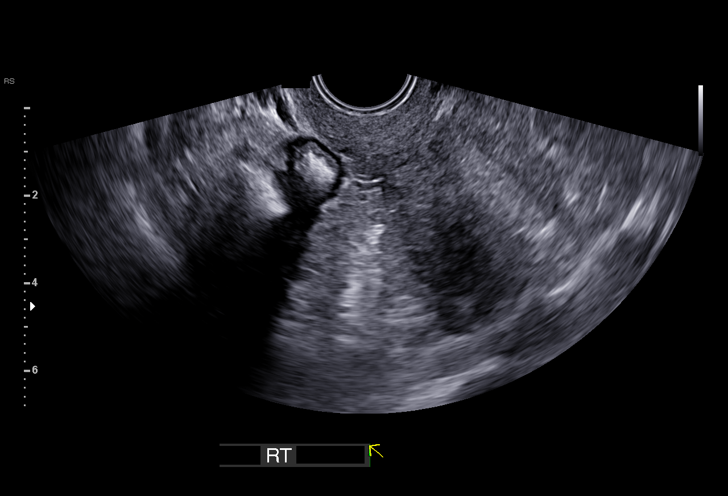
[im 39/56]
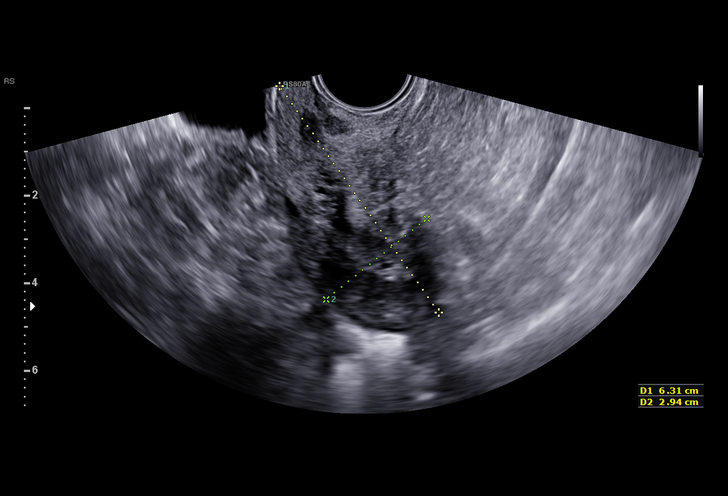
[im 44/56]
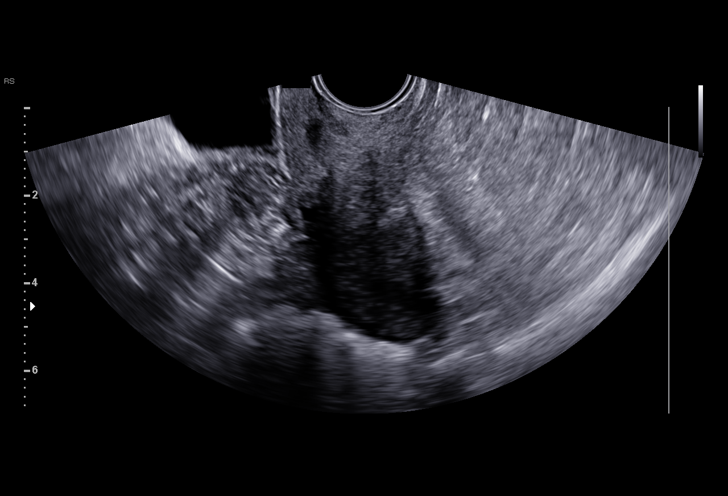
[im 46/56]
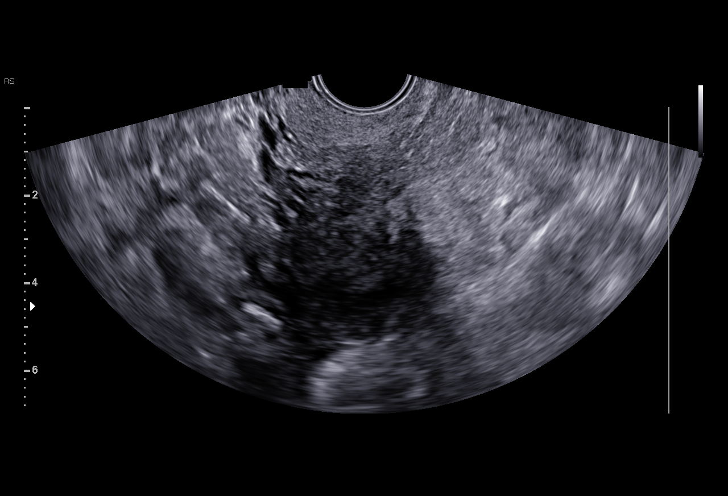
[im 51/56]
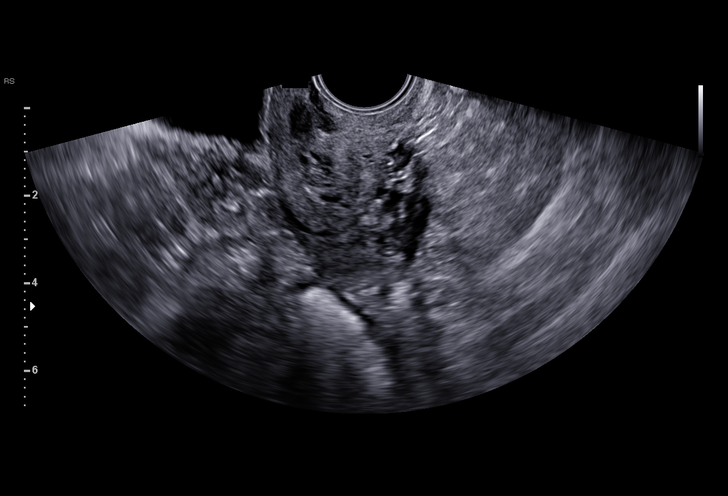
[im 56/56]
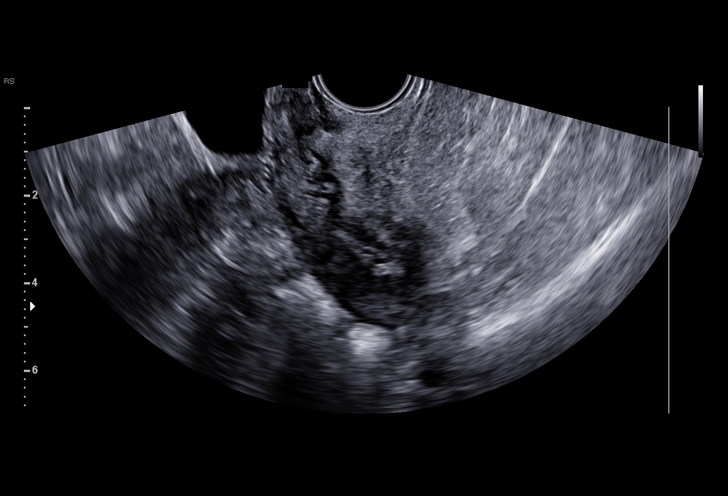

[15 of 25 positions shown; findings below may reference images not displayed]

FINDINGS: Uterus

Measurements: 6.6 x 2.8 x 4.3 cm.  Normal morphology without mass

Endometrium

Thickness: 5 mm thick, normal. No endometrial fluid or focal
abnormality

Right ovary

Not visualized on either transabdominal or endovaginal imaging
suspect obscured by bowel

Left ovary

Measurements: 1.7 x 0.9 x 1.0 cm.  Normal morphology without mass

Other findings

No free pelvic fluid or adnexal masses.
IMPRESSION: Unremarkable uterus and LEFT ovary.

Nonvisualization of RIGHT ovary.

## 2020-04-15 LAB — HM MAMMOGRAPHY

## 2020-04-24 ENCOUNTER — Other Ambulatory Visit: Payer: Self-pay

## 2020-04-24 ENCOUNTER — Encounter: Payer: Self-pay | Admitting: Adult Health Nurse Practitioner

## 2020-04-24 ENCOUNTER — Ambulatory Visit (INDEPENDENT_AMBULATORY_CARE_PROVIDER_SITE_OTHER): Payer: 59 | Admitting: Adult Health Nurse Practitioner

## 2020-04-24 VITALS — Temp 99.6°F

## 2020-04-24 DIAGNOSIS — R059 Cough, unspecified: Secondary | ICD-10-CM

## 2020-04-24 DIAGNOSIS — U071 COVID-19: Secondary | ICD-10-CM | POA: Diagnosis not present

## 2020-04-24 DIAGNOSIS — J0140 Acute pansinusitis, unspecified: Secondary | ICD-10-CM

## 2020-04-24 DIAGNOSIS — J00 Acute nasopharyngitis [common cold]: Secondary | ICD-10-CM | POA: Diagnosis not present

## 2020-04-24 MED ORDER — AZITHROMYCIN 250 MG PO TABS: ORAL_TABLET | ORAL | 1 refills | Status: AC

## 2020-04-24 MED ORDER — PROMETHAZINE-DM 6.25-15 MG/5ML PO SYRP: 5.0000 mL | ORAL_SOLUTION | Freq: Four times a day (QID) | ORAL | 1 refills | Status: DC | PRN

## 2020-04-24 MED ORDER — DEXAMETHASONE 4 MG PO TABS: ORAL_TABLET | ORAL | 1 refills | Status: DC

## 2020-04-24 NOTE — Progress Notes (Addendum)
Virtual Visit via Telephone Note  I connected with Sydney Dickson on 04/24/20 at  9:45 AM EST by telephone and verified that I am speaking with the correct person using two identifiers.   I discussed the limitations, risks, security and privacy concerns of performing an evaluation and management service by telephone and the availability of in person appointments. I also discussed with the patient that there may be a patient responsible charge related to this service. The patient expressed understanding and agreed to proceed.   Assessment and Plan:  Sydney Dickson was seen today for, bodyache, fever, chills, nasal congestion and sxs start jan 18th 2022.  Diagnoses and all orders for this visit:  Acute non-recurrent pansinusitis -     azithromycin (ZITHROMAX) 250 MG tablet; Take 2 tablets (500 mg) on  Day 1,  followed by 1 tablet (250 mg) once daily on Days 2 through 5. -     dexamethasone (DECADRON) 4 MG tablet; Take 1 tab 3 x day - 3 days, then 2 x day - 3 days, then 1 tab daily  Cough -     promethazine-dextromethorphan (PROMETHAZINE-DM) 6.25-15 MG/5ML syrup; Take 5 mLs by mouth 4 (four) times daily as needed for cough.  Acute rhinitis Change antihistamines from Xyzal to Zyrtec or Allegra.  COVID-19 Discussed precautions and quarentine    Follow Up Instructions:    I discussed the assessment and treatment plan with the patient. The patient was provided an opportunity to ask questions and all were answered. The patient agreed with the plan and demonstrated an understanding of the instructions.   The patient was advised to call back or seek an in-person evaluation if the symptoms worsen or if the condition fails to improve as anticipated.  I provided 20 minutes of non-face-to-face time during this encounter including counseling, chart review, and critical decision making was preformed.   Future Appointments  Date Time Provider [place]  05/07/2020  1:00 PM Shamleffer, Sydney Crazier, MD [facility]  06/18/2020  2:00 PM Sydney Sierras, NP [facility] None      History of Present Illness:  65 y.o. female presents for telephone visit for symptoms that started one day ago. Received antigen test from local pharmacy that was positive.  She reports she is very achy, low grade fever, body aches and headache.  She is having nasal drainage down back of throat.  She reports some nasal congestion.  Mild cough, non-productive, mild sore throat related to drainage.  Also having ear fullness.  Denies any shortness of breath, wheezing, chest pain, nausea vomiting diarrhea.  She is taking Tylenol 1,000mg  and also some cough medication promethazine that has helped some.     Medical History:  Past Medical History:  Diagnosis Date  . ADD (attention deficit disorder)   . Allergy   . Anxiety   . Arthritis   . Asthma    with severe allergies pt does use albuterol inhaler when needed  . Cataract   . Depression   . Diabetes mellitus without complication ([place]) 3762   TYPE 2   . GERD (gastroesophageal reflux disease)    hx of had Nissen Fundoplication 8315 has not had any issues since  . History of hiatal hernia   . Hyperlipidemia 2004  . Hypertension   . IBS (irritable bowel syndrome)   . Lynch syndrome   . Tubular adenoma of colon     Current Medications:  Current Outpatient Medications on File Prior to Visit  Medication Sig  .  acetaminophen (TYLENOL) 500 MG tablet Take 1,000 mg by mouth daily as needed for moderate pain or headache.  . albuterol (PROVENTIL HFA;VENTOLIN HFA) 108 (90 Base) MCG/ACT inhaler  Use 1 to 2 Inhalations 15 minutes apart every 4 hours to Rescue Asthma (Patient taking differently: as needed. )  . ALPRAZolam (XANAX) 0.5 MG tablet Take 1 tablet (0.5 mg total) by mouth 2 (two) times daily as needed.  Marland Kitchen amitriptyline (ELAVIL) 50 MG tablet Take     1 tablet        at Bedtime       as needed for Sleep  . aspirin 81 MG tablet Take 81 mg by mouth  daily.  . CHOLECALCIFEROL PO Take 5,000 Units by mouth daily.  . Dulaglutide (TRULICITY) 1.61 WR/6.0AV SOPN Inject 0.75 mg into the skin once a week.  . escitalopram (LEXAPRO) 20 MG tablet TAKE 1 TABLET BY MOUTH DAILY FOR MOOD  . fluticasone (FLONASE) 50 MCG/ACT nasal spray Place 2 sprays into both nostrils daily.  Marland Kitchen ibuprofen (ADVIL,MOTRIN) 800 MG tablet Take 1 tablet (800 mg total) by mouth every 8 (eight) hours as needed.  . Insulin Syringe-Needle U-100 (BD INSULIN SYRINGE U/F) 31G X 5/16" 1 ML MISC Use twice a day  . ipratropium (ATROVENT) 0.06 % nasal spray Place 2 sprays into the nose 3 (three) times daily.  Marland Kitchen levocetirizine (XYZAL) 5 MG tablet Take     1 tablet      Daily       for Allergies  . lisdexamfetamine (VYVANSE) 20 MG capsule Take 1 capsule (20 mg total) by mouth daily.  Marland Kitchen losartan (COZAAR) 100 MG tablet TAKE 1 TABLET DAILY FOR BP & DIABETIC KIDNEY PROTECTION  . metFORMIN (GLUCOPHAGE-XR) 750 MG 24 hr tablet Take 1 tablet 2 x /day with Meals for Diabetes  . NOVOLIN 70/30 (70-30) 100 UNIT/ML injection INJECT 25 UNITS IN THE AM AND 20 UNITS AT DINNER  . olopatadine (PATANOL) 0.1 % ophthalmic solution PLACE 1 DROP INTO BOTH EYES 2 (TWO) TIMES DAILY.  . rosuvastatin (CRESTOR) 20 MG tablet TAKE 1 TABLET BY MOUTH EVERY DAY FOR CHOLESTEROL  . tiZANidine (ZANAFLEX) 2 MG tablet TAKE 1 TABLET (2 MG TOTAL) BY MOUTH EVERY 6 (SIX) HOURS AS NEEDED FOR MUSCLE SPASMS.  Marland Kitchen traMADol (ULTRAM) 50 MG tablet Take 1 tablet (50 mg total) by mouth every 6 (six) hours as needed.   No current facility-administered medications on file prior to visit.    Allergies:  Allergies  Allergen Reactions  . Tape Rash  . Ace Inhibitors Cough  . Metformin And Related     myalgia  . Morphine And Related Itching  . Penicillins     Pt states that in 3rd grade she was given an injection in the buttock and reacted with a large hive in that area Pt can take oral penicillin with no reaction    . Wellbutrin  [Bupropion]     Felt funny      Review of Systems:  Review of Systems  Constitutional: Positive for chills, fever and malaise/fatigue. Negative for diaphoresis and weight loss.  HENT: Positive for congestion, sinus pain and sore throat. Negative for ear discharge, ear pain, hearing loss, nosebleeds and tinnitus.   Eyes: Negative for blurred vision, double vision, photophobia, pain, discharge and redness.  Respiratory: Positive for cough. Negative for hemoptysis, sputum production, shortness of breath, wheezing and stridor.   Cardiovascular: Negative for chest pain, palpitations, orthopnea, claudication, leg swelling and PND.  Gastrointestinal: Negative for  abdominal pain, blood in stool, constipation, diarrhea, heartburn, melena, nausea and vomiting.  Genitourinary: Negative for dysuria, flank pain, frequency, hematuria and urgency.  Musculoskeletal: Positive for myalgias. Negative for back pain, falls, joint pain and neck pain.  Skin: Negative for itching and rash.  Neurological: Positive for headaches. Negative for dizziness, tingling, tremors and sensory change.  Endo/Heme/Allergies: Does not bruise/bleed easily.  Psychiatric/Behavioral: Negative for depression and suicidal ideas.     Observations/Objective: General : Well sounding patient in no apparent distress HEENT: no hoarseness, coughing noted during conversation Lungs: speaks in complete sentences, no audible wheezing, no apparent distress Neurological: alert, oriented x 3 Psychiatric: pleasant, judgement appropriate      Sydney Sierras, NP [facility] 04/24/2020  10:24 AM

## 2020-04-24 NOTE — Patient Instructions (Addendum)
  Today you had a telephone visit with Garnet Sierras, DNP.  Below is a summary of your visit.    We have sent in Azithromyacin, take two tablets when you get the prescription and then one tablet until complete.  A Dexamethasone steroid taper has been sent to the pharmacy for you to help reduce inflammation. Please take pepsid 40mg  twice a day while you are taking steroids to reduce further stomach upset.   At the pharmacy purchase and Oxygen sensor.  Goal is 93% or higher.  Make sure your finger is warm.  Nail polish may alter readings.  IF reading is less that 93% take a few deep breaths in through your nose and out your mouth to increase the readings.  **IF you are less than 90% and short of breath please SEEK IMMEDIATE ATTENTION via 911.**   Chest Congestion: Continue Taking the Mucinex DM every 12 hours.  You can try humidification or Warm steamy shower to help loosen chest.  Cough: You may take the Delsym cough syrup, be mindful of timing as there is a small amount of this in the Mucinex DM. We will also send in Promethazine cough syrup.  Sore throat For your sore throat, gargle salt water and spit out.  Throat lozengers or chloraseptic spray.   Runny nose, ear fullness Try to change antihistamine from Levocitrizine to Cetirizine (Zyrtec) every night to help dry up any fluid draining in back of your throat.  IF this does not work try Human resources officer (Fexofenadine).  Use the antihistamine that best gives you the drying effect.   Nasal Congestion: Take Sudafed as directed.  If you take blood pressure medication, monitor your blood pressure as it can increase if taking this medication.  Body Aches:  You can take Acetaminophen (Tylenol-focuses on pain fever reduction) 1,000mg  every 8 hours alternating with Ibuprofen (Advil-focuses on inflammation, pain, fever) 600mg  every 6hours OR 800mg  every 8hours.  Continue to take Vitamin D supplement to help your immune system.  Stay hydrated  drinking lots of water or some Gatorade.  IF your taste and smell is absent, be mindful of eating salty food or adding salt to foods.  You will be able to taste salt OR the first to return so be careful as this can cause you to retain fluid and increase blood pressure.   Please let us know if you have any new or worsening symptoms or need any help with symptoms management.   Below is summary of CDC guidance for testing positive and for exposure.        Garnet Sierras, Laqueta Jean, DNP [facility] 04/24/2020  10:25 AM

## 2020-04-29 ENCOUNTER — Other Ambulatory Visit: Payer: Self-pay | Admitting: *Deleted

## 2020-04-29 MED ORDER — ONETOUCH VERIO VI STRP: ORAL_STRIP | 1 refills | Status: DC

## 2020-04-29 MED ORDER — ONETOUCH ULTRASOFT LANCETS MISC: 1 refills | Status: AC

## 2020-05-07 ENCOUNTER — Encounter: Payer: Self-pay | Admitting: Internal Medicine

## 2020-05-07 ENCOUNTER — Other Ambulatory Visit: Payer: Self-pay

## 2020-05-07 ENCOUNTER — Ambulatory Visit (INDEPENDENT_AMBULATORY_CARE_PROVIDER_SITE_OTHER): Payer: 59 | Admitting: Internal Medicine

## 2020-05-07 VITALS — BP 120/72 | HR 100 | Ht 64.0 in | Wt 164.5 lb

## 2020-05-07 DIAGNOSIS — E1165 Type 2 diabetes mellitus with hyperglycemia: Secondary | ICD-10-CM

## 2020-05-07 DIAGNOSIS — Z794 Long term (current) use of insulin: Secondary | ICD-10-CM | POA: Diagnosis not present

## 2020-05-07 LAB — POCT GLYCOSYLATED HEMOGLOBIN (HGB A1C): Hemoglobin A1C: 11.4 % — AB (ref 4.0–5.6)

## 2020-05-07 MED ORDER — NOVOLIN 70/30 (70-30) 100 UNIT/ML ~~LOC~~ SUSP: SUBCUTANEOUS | 2 refills | Status: DC

## 2020-05-07 MED ORDER — METFORMIN HCL ER 750 MG PO TB24: ORAL_TABLET | ORAL | 3 refills | Status: DC

## 2020-05-07 MED ORDER — RYBELSUS 3 MG PO TABS: 3.0000 mg | ORAL_TABLET | Freq: Every day | ORAL | 4 refills | Status: DC

## 2020-05-07 NOTE — Progress Notes (Signed)
Name: Sydney Dickson  MRN/ DOB: [id number], 07/30/1955   Age/ Sex: 65 y.o., female    PCP: Unk Pinto, MD   Reason for Endocrinology Evaluation: Type 2 Diabetes Mellitus     Date of Initial Endocrinology Visit: 05/07/2020     PATIENT IDENTIFIER: Sydney Dickson is a 65 y.o. female with a past medical history of T2DM, HTN , MSH2 - related Lynch Syndrome and Dyslipidemia . The patient presented for initial endocrinology clinic visit on 05/07/2020 for consultative assistance with her diabetes management.    HPI: Sydney Dickson was    Diagnosed with DM in 2015 Prior Medications tried/Intolerance: Trulicity - local reaction , Ozempic - GI upset . Farxiga - yeast infection  Currently checking blood sugars 1 x / day Hypoglycemia episodes : no             Hemoglobin A1c has ranged from 6.6%  in 2017, peaking at 13.8% in 2019. Patient required assistance for hypoglycemia: no Patient has required hospitalization within the last 1 year from hyper or hypoglycemia: no  In terms of diet, the patient eats 2-3 meals  A day, snacks a couple of times a day.    HOME DIABETES REGIMEN: Novolin 70/30 15 units with Breakfast and 10 units with Supper  Metformin 750 XR BID     Statin: yes ACE-I/ARB: yes Prior Diabetic Education: yes   METER DOWNLOAD SUMMARY: Date range evaluated: 1/19-05/07/2020 Average Number Tests/Day = 0.8 Overall Mean FS Glucose = 335 Standard Deviation = 40  BG Ranges: Low = 274 High = 412   Hypoglycemic Events/30 Days: BG < 50 = 0 Episodes of symptomatic severe hypoglycemia = 0   DIABETIC COMPLICATIONS: Microvascular complications:    Denies: CKD, neuropathy , retinopathy  Last eye exam: Completed  06/2019  Macrovascular complications:    Denies: CAD, PVD, CVA   PAST HISTORY: Past Medical History:  Past Medical History:  Diagnosis Date  . ADD (attention deficit disorder)   . Allergy   . Anxiety   . Arthritis   . Asthma    with severe allergies  pt does use albuterol inhaler when needed  . Cataract   . Depression   . Diabetes mellitus without complication ([place]) 0867   TYPE 2   . GERD (gastroesophageal reflux disease)    hx of had Nissen Fundoplication 6195 has not had any issues since  . History of hiatal hernia   . Hyperlipidemia 2004  . Hypertension   . IBS (irritable bowel syndrome)   . Lynch syndrome   . Tubular adenoma of colon    Past Surgical History:  Past Surgical History:  Procedure Laterality Date  . [place], 2001  . CHOLECYSTECTOMY    . colonscopy     . COSMETIC SURGERY     rhinoplasty  . EYE SURGERY  2006   lasik  . HERNIA REPAIR    . NASAL SINUS SURGERY    . NISSEN FUNDOPLICATION  0932  . PARTIAL PROCTECTOMY BY TEM N/A 07/19/2014   Procedure: PARTIAL PROCTECTOMY BY TEM;  Surgeon: Michael Boston, MD;  Location: [facility];  Service: General;  Laterality: N/A;  . ROTATOR CUFF REPAIR Right 2012  . TUBAL LIGATION    . UPPER GI ENDOSCOPY        Social History:  reports that she has never smoked. She has never used smokeless tobacco. She reports that she does not drink alcohol and does not use drugs. Family History:  Family  History  Problem Relation Age of Onset  . Cancer Mother        breast  . Hyperlipidemia Mother   . Hypertension Mother   . Diabetes Mother   . Breast cancer Mother   . Hyperlipidemia Father   . Hypertension Father   . Colon cancer Father 11  . Skin cancer Father   . Prostate cancer Father   . Breast cancer Sister   . Colon cancer Brother 68  . Cancer Paternal Grandmother        cervical and ? uterine  . Colon cancer Paternal Grandfather 61  . Stomach cancer Neg Hx   . Esophageal cancer Neg Hx   . Rectal cancer Neg Hx      HOME MEDICATIONS: Allergies as of 05/07/2020      Reactions   Tape Rash   Ace Inhibitors Cough   Metformin And Related    myalgia   Morphine And Related Itching   Penicillins    Pt states that in 3rd grade she was given an injection  in the buttock and reacted with a large hive in that area Pt can take oral penicillin with no reaction    Wellbutrin [bupropion]    Felt funny      Medication List       Accurate as of May 07, 2020  1:18 PM. If you have any questions, ask your nurse or doctor.        acetaminophen 500 MG tablet Commonly known as: TYLENOL Take 1,000 mg by mouth daily as needed for moderate pain or headache.   albuterol 108 (90 Base) MCG/ACT inhaler Commonly known as: VENTOLIN HFA Use 1 to 2 Inhalations 15 minutes apart every 4 hours to Rescue Asthma What changed: See the new instructions.   ALPRAZolam 0.5 MG tablet Commonly known as: XANAX Take 1 tablet (0.5 mg total) by mouth 2 (two) times daily as needed.   amitriptyline 50 MG tablet Commonly known as: ELAVIL Take     1 tablet        at Bedtime       as needed for Sleep   aspirin 81 MG tablet Take 81 mg by mouth daily.   CHOLECALCIFEROL PO Take 5,000 Units by mouth daily.   dexamethasone 4 MG tablet Commonly known as: DECADRON Take 1 tab 3 x day - 3 days, then 2 x day - 3 days, then 1 tab daily   escitalopram 20 MG tablet Commonly known as: LEXAPRO TAKE 1 TABLET BY MOUTH DAILY FOR MOOD   fluticasone 50 MCG/ACT nasal spray Commonly known as: FLONASE Place 2 sprays into both nostrils daily.   ibuprofen 800 MG tablet Commonly known as: ADVIL Take 1 tablet (800 mg total) by mouth every 8 (eight) hours as needed.   Insulin Syringe-Needle U-100 31G X 5/16" 1 ML Misc Commonly known as: BD Insulin Syringe U/F Use twice a day   ipratropium 0.06 % nasal spray Commonly known as: ATROVENT Place 2 sprays into the nose 3 (three) times daily.   levocetirizine 5 MG tablet Commonly known as: XYZAL Take     1 tablet      Daily       for Allergies   lisdexamfetamine 20 MG capsule Commonly known as: Vyvanse Take 1 capsule (20 mg total) by mouth daily.   losartan 100 MG tablet Commonly known as: COZAAR TAKE 1 TABLET DAILY FOR BP  & DIABETIC KIDNEY PROTECTION   metFORMIN 750 MG 24 hr tablet Commonly  known as: GLUCOPHAGE-XR Take 1 tablet 2 x /day with Meals for Diabetes   NovoLIN 70/30 (70-30) 100 UNIT/ML injection Generic drug: insulin NPH-regular Human INJECT 25 UNITS IN THE AM AND 20 UNITS AT DINNER   olopatadine 0.1 % ophthalmic solution Commonly known as: PATANOL PLACE 1 DROP INTO BOTH EYES 2 (TWO) TIMES DAILY.   onetouch ultrasoft lancets Use 3 lancets daily to check blood sugar-DX-E11.65   OneTouch Verio test strip Generic drug: glucose blood Check blood sugar 3 times daily-DX-E11.65   promethazine-dextromethorphan 6.25-15 MG/5ML syrup Commonly known as: PROMETHAZINE-DM Take 5 mLs by mouth 4 (four) times daily as needed for cough.   rosuvastatin 20 MG tablet Commonly known as: CRESTOR TAKE 1 TABLET BY MOUTH EVERY DAY FOR CHOLESTEROL   tiZANidine 2 MG tablet Commonly known as: ZANAFLEX TAKE 1 TABLET (2 MG TOTAL) BY MOUTH EVERY 6 (SIX) HOURS AS NEEDED FOR MUSCLE SPASMS.   traMADol 50 MG tablet Commonly known as: ULTRAM Take 1 tablet (50 mg total) by mouth every 6 (six) hours as needed.   Trulicity 4.85 IO/2.7OJ Sopn Generic drug: Dulaglutide Inject 0.75 mg into the skin once a week.        ALLERGIES: Allergies  Allergen Reactions  . Tape Rash  . Ace Inhibitors Cough  . Metformin And Related     myalgia  . Morphine And Related Itching  . Penicillins     Pt states that in 3rd grade she was given an injection in the buttock and reacted with a large hive in that area Pt can take oral penicillin with no reaction    . Wellbutrin [Bupropion]     Felt funny     REVIEW OF SYSTEMS: A comprehensive ROS was conducted with the patient and is negative except as per HPI    OBJECTIVE:   VITAL SIGNS: BP 120/72   Pulse 100   Ht 5' 4"  (1.626 m)   Wt 164 lb 8 oz (74.6 kg)   LMP  (LMP Unknown)   SpO2 98%   BMI 28.24 kg/m    PHYSICAL EXAM:  General: Pt appears well and is in NAD   Neck: General: Supple without adenopathy or carotid bruits. Thyroid: Thyroid size normal.  No goiter or nodules appreciated. No thyroid bruit.  Lungs: Clear with good BS bilat with no rales, rhonchi, or wheezes  Heart: RRR with normal S1 and S2 and no gallops; no murmurs; no rub  Abdomen: Normoactive bowel sounds, soft, nontender, without masses or organomegaly palpable  Extremities:  Lower extremities - No pretibial edema. No lesions.  Skin: Normal texture and temperature to palpation. No rash noted  Neuro: MS is good with appropriate affect, pt is alert and Ox3    DM foot exam: 05/07/2020  The skin of the feet is intact without sores or ulcerations. The pedal pulses are 2+ on right and 2+ on left. The sensation is intact to a screening 5.07, 10 gram monofilament bilaterally   DATA REVIEWED:  Lab Results  Component Value Date   HGBA1C 11.4 (A) 05/07/2020   HGBA1C 10.7 (H) 11/08/2019   HGBA1C 9.9 (H) 06/19/2019    Results for BRITTANEY, BEAULIEU (MRN [id number]) as of 05/07/2020 12:19  Ref. Range 11/08/2019 11:10  Sodium Latest Ref Range: 135 - 146 mmol/L 134 (L)  Potassium Latest Ref Range: 3.5 - 5.3 mmol/L 4.3  Chloride Latest Ref Range: 98 - 110 mmol/L 100  CO2 Latest Ref Range: 20 - 32 mmol/L 24  Glucose Latest Ref Range:  65 - 99 mg/dL 459 (H)  Mean Plasma Glucose Latest Units: (calc) 260  BUN Latest Ref Range: 7 - 25 mg/dL 15  Creatinine Latest Ref Range: 0.50 - 0.99 mg/dL 0.97  Calcium Latest Ref Range: 8.6 - 10.4 mg/dL 9.9  BUN/Creatinine Ratio Latest Ref Range: 6 - 22 (calc) NOT APPLICABLE  Magnesium Latest Ref Range: 1.5 - 2.5 mg/dL 1.7  AG Ratio Latest Ref Range: 1.0 - 2.5 (calc) 1.4  AST Latest Ref Range: 10 - 35 U/L 17  ALT Latest Ref Range: 6 - 29 U/L 16  Total Protein Latest Ref Range: 6.1 - 8.1 g/dL 7.1  Total Bilirubin Latest Ref Range: 0.2 - 1.2 mg/dL 0.7  GFR, Est Non African American Latest Ref Range: > OR = 60 mL/min/1.33m 62  GFR, Est African American Latest  Ref Range: > OR = 60 mL/min/1.767m72  Total CHOL/HDL Ratio Latest Ref Range: <5.0 (calc) 2.5  Cholesterol Latest Ref Range: <200 mg/dL 132  HDL Cholesterol Latest Ref Range: > OR = 50 mg/dL 52  LDL Cholesterol (Calc) Latest Units: mg/dL (calc) 57  Non-HDL Cholesterol (Calc) Latest Ref Range: <130 mg/dL (calc) 80  Triglycerides Latest Ref Range: <150 mg/dL 148  Alkaline phosphatase (APISO) Latest Ref Range: 37 - 153 U/L 88  Vitamin D, 25-Hydroxy Latest Ref Range: 30 - 100 ng/mL 43  Globulin Latest Ref Range: 1.9 - 3.7 g/dL (calc) 2.9   ASSESSMENT / PLAN / RECOMMENDATIONS:   1) Type 2 Diabetes Mellitus, Poorly controlled, With  complications - Most recent A1c of 11.4 %. Goal A1c < 7.0 %.    Plan: GENERAL: I have discussed with the patient the pathophysiology of diabetes. We went over the natural progression of the disease. We talked about both insulin resistance and insulin deficiency. We stressed the importance of lifestyle changes including diet and exercise. I explained the complications associated with diabetes including retinopathy, nephropathy, neuropathy as well as increased risk of cardiovascular disease. We went over the benefit seen with glycemic control.    I explained to the patient that diabetic patients are at higher than normal risk for amputations.  Developed a local reaction to trulicity, Ozempic caused GI side effects, FaWilder Gladeaused recurrent genital infections  Not interested in CGM, as she has a sensitive skin and gets multiple allergies  Emphasized the importance of low carb diet and avoiding sugar-sweetened beverages   MEDICATIONS: - Start Rybelsus 3 mg, 1 tablet before Breakfast  - Continue Metformin 750 mg , 1 tablet twice daily  - Increase Novolin (70/30) to  20 units before Breakfast and 16 units before Supper  EDUCATION / INSTRUCTIONS:  BG monitoring instructions: Patient is instructed to check her blood sugars 2 times a day, before breakfast and Supper  .  [facility] if: BG persistently < 70  . I reviewed the Rule of 15 for the treatment of hypoglycemia in detail with the patient. Literature supplied.   2) Diabetic complications:   Eye: Does not have known diabetic retinopathy.   Neuro/ Feet: Does not have known diabetic peripheral neuropathy.  Renal: Patient does not have known baseline CKD. She is on an ACEI/ARB at present.   3) Dyslipidemia: Patient is on Rosuvastatin. LDL at goal .     I spent 45 minutes preparing to see the patient by review of recent labs, imaging and procedures, obtaining and reviewing separately obtained history, communicating with the patient, ordering medications, tests or procedures, and documenting clinical information in the EHR including  the differential Dx, treatment, and any further evaluation and other management    F/U in 3 months    Signed electronically by: Mack Guise, MD  [facility] Endocrinology  [facility] [address] [place], [place] [address] Phone: [phone] FAX: [phone]   CC: Unk Pinto, [place] [address] Phone: [phone]  Fax: [phone]    Return to Endocrinology clinic as below: Future Appointments  Date Time Provider Jones  06/18/2020  2:00 PM Garnet Sierras, NP [facility] None

## 2020-05-07 NOTE — Patient Instructions (Addendum)
-   Start Rybelsus 3 mg, 1 tablet before Breakfast  - Continue Metformin 750 mg , 1 tablet twice daily  - Increase Novolin (70/30) to  20 units before Breakfast and 16 units before Supper    Choose healthy, lower carb lower calorie snacks: toss salad, cooked vegetables, cottage cheese, peanut butter, low fat cheese / string cheese, lower sodium deli meat, tuna salad or chicken salad     HOW TO TREAT LOW BLOOD SUGARS (Blood sugar LESS THAN 70 MG/DL)  Please follow the RULE OF 15 for the treatment of hypoglycemia treatment (when your (blood sugars are less than 70 mg/dL)    STEP 1: Take 15 grams of carbohydrates when your blood sugar is low, which includes:   3-4 GLUCOSE TABS  OR  3-4 OZ OF JUICE OR REGULAR SODA OR  ONE TUBE OF GLUCOSE GEL     STEP 2: RECHECK blood sugar in 15 MINUTES STEP 3: If your blood sugar is still low at the 15 minute recheck --> then, go back to STEP 1 and treat AGAIN with another 15 grams of carbohydrates.

## 2020-05-14 ENCOUNTER — Encounter: Payer: Self-pay | Admitting: *Deleted

## 2020-05-20 ENCOUNTER — Encounter: Payer: Self-pay | Admitting: Internal Medicine

## 2020-06-06 LAB — HM DIABETES EYE EXAM

## 2020-06-12 ENCOUNTER — Encounter: Payer: Self-pay | Admitting: *Deleted

## 2020-06-18 ENCOUNTER — Ambulatory Visit (INDEPENDENT_AMBULATORY_CARE_PROVIDER_SITE_OTHER): Payer: 59 | Admitting: Adult Health Nurse Practitioner

## 2020-06-18 ENCOUNTER — Other Ambulatory Visit: Payer: Self-pay

## 2020-06-18 ENCOUNTER — Encounter: Payer: Self-pay | Admitting: Adult Health Nurse Practitioner

## 2020-06-18 VITALS — BP 110/80 | HR 111 | Temp 97.3°F | Ht 64.5 in | Wt 167.0 lb

## 2020-06-18 DIAGNOSIS — Z1321 Encounter for screening for nutritional disorder: Secondary | ICD-10-CM

## 2020-06-18 DIAGNOSIS — Z0001 Encounter for general adult medical examination with abnormal findings: Secondary | ICD-10-CM

## 2020-06-18 DIAGNOSIS — E1169 Type 2 diabetes mellitus with other specified complication: Secondary | ICD-10-CM

## 2020-06-18 DIAGNOSIS — Z1389 Encounter for screening for other disorder: Secondary | ICD-10-CM

## 2020-06-18 DIAGNOSIS — Z136 Encounter for screening for cardiovascular disorders: Secondary | ICD-10-CM

## 2020-06-18 DIAGNOSIS — E1165 Type 2 diabetes mellitus with hyperglycemia: Secondary | ICD-10-CM

## 2020-06-18 DIAGNOSIS — Z0000 Encounter for general adult medical examination without abnormal findings: Secondary | ICD-10-CM

## 2020-06-18 DIAGNOSIS — Z79899 Other long term (current) drug therapy: Secondary | ICD-10-CM | POA: Diagnosis not present

## 2020-06-18 DIAGNOSIS — I8393 Asymptomatic varicose veins of bilateral lower extremities: Secondary | ICD-10-CM

## 2020-06-18 DIAGNOSIS — E559 Vitamin D deficiency, unspecified: Secondary | ICD-10-CM | POA: Diagnosis not present

## 2020-06-18 DIAGNOSIS — Z130 Encounter for screening for diseases of the blood and blood-forming organs and certain disorders involving the immune mechanism: Secondary | ICD-10-CM

## 2020-06-18 DIAGNOSIS — Z13220 Encounter for screening for lipoid disorders: Secondary | ICD-10-CM | POA: Diagnosis not present

## 2020-06-18 DIAGNOSIS — G5762 Lesion of plantar nerve, left lower limb: Secondary | ICD-10-CM

## 2020-06-18 DIAGNOSIS — Z131 Encounter for screening for diabetes mellitus: Secondary | ICD-10-CM

## 2020-06-18 DIAGNOSIS — F988 Other specified behavioral and emotional disorders with onset usually occurring in childhood and adolescence: Secondary | ICD-10-CM

## 2020-06-18 DIAGNOSIS — Z1509 Genetic susceptibility to other malignant neoplasm: Secondary | ICD-10-CM

## 2020-06-18 DIAGNOSIS — H04129 Dry eye syndrome of unspecified lacrimal gland: Secondary | ICD-10-CM

## 2020-06-18 DIAGNOSIS — I10 Essential (primary) hypertension: Secondary | ICD-10-CM | POA: Diagnosis not present

## 2020-06-18 DIAGNOSIS — Z794 Long term (current) use of insulin: Secondary | ICD-10-CM

## 2020-06-18 NOTE — Progress Notes (Signed)
COMPLETE PHYSICAL  Assessment and Plan:  Essential hypertension - continue medications: Losartan 121m daily DASH diet, exercise and monitor at home. Call if greater than 130/80.  -     CBC with Differential/Platelet -     BASIC METABOLIC PANEL WITH GFR -     Hepatic function panel -     TSH -     Urinalysis, Routine w reflex microscopic -     Microalbumin / creatinine urine ratio -     EKG 12-Lead  Lynch syndrome -     Continue follow up GYN/GI Yearly colonoscopy Dr PHilarie Fredrickson Diabetes mellitus with hyperlipidemia ([facility] Discussed general issues about diabetes pathophysiology and management., Educational material distributed., Suggested low cholesterol diet., Encouraged aerobic exercise., Discussed foot care., Reminded to get yearly retinal exam.  Medication management Continuned  Vitamin D deficiency Continue supplement  Varicose veins of both lower extremities, unspecified whether complicated - weight loss discussed, continue compression stockings and elevation  Attention deficit disorder, unspecified hyperactivity presence vyvanse 20 mg daily with benefit Doing well  Class 1 obesity due to excess calories with serious comorbidity and body mass index (BMI) of 31.0 to 31.9 in adult - follow up 3 months for progress monitoring - increase veggies, decrease carbs - long discussion about weight loss, diet, and exercise  Hyperlipidemia associated with type 2 diabetes mellitus (HCC) Continue medications: Rosuvastatin 258mdaily Discussed dietary and exercise modifications Low fat diet -     Lipid panel   Type 2 diabetes mellitus with hyperglycemia, with long-term current use of insulin (HCC) Continue medications: novolin 70/30 : 20units in am and 18units in pm, likely will need to increase this. Taking Metformin 7508mID Rybelsus 3mg19mants to go back on trulicity? Discussed general issues about diabetes pathophysiology and management. Education: Reviewed 'ABCs' of  diabetes management (respective goals in parentheses):  A1C (<7), blood pressure (<130/80), and cholesterol (LDL <70) Dietary recommendations Encouraged aerobic exercise.  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 Screening labs and tests as requested with regular follow-up as recommended. Over 40 minutes of face to face interview exam, counseling, chart review, and complex, high level critical decision making was performed this visit.   Future Appointments  Date Time Provider DepaGutierrez3/2022 10:50 AM Shamleffer, IbteMelanie Crazier [facility]  06/18/2021  2:00 PM McClGarnet Sierras [facility] None    HPI  65 y43. female  presents for a complete physical.  No health or medication concerns today.  She uses an app to track her blood glucose the average is 300's to 400's. Highest 425.  She checks this BID.She is taking Novolin 70/30, 20untits in am and 16 in HS.  Metformin 727m BID, Rybelsus 371mdaily. She has taken glimepride 57m47mn the past 2019-19 time frame unsure as to why she is no longer taking this.   Discussed importance of lowering her blood glucose readings,  Dicsussed increase RISK OF HEART ATTACK AND STROKE!  Also increased risk of organ damage.  She reports she walks her dog  daily with her husband.  She reports she drink 64oz of water a day.  Bilateral hip pain x months, lower back bilateral. Declines referral to PT or ortho. Bilateral SI No pain down her legs, no weakness in her legs.   BMI is Body mass index is 28.22 kg/m., she is working on diet and exercise. Wt Readings from Last 3 Encounters:  06/18/20 167 lb (75.8 kg)  05/07/20 164 lb 8 oz (74.6 kg)  01/08/20 171 lb (77.6 kg)   Her blood pressure has been controlled at home, today their BP is BP: 110/80 She does workout. She denies chest pain, shortness of breath, dizziness.   Some seasonal allergies. No wheezing but has constant drainage, she is on xyzal . No GERD symptoms.   She has been working on diet and exercise for diabetes  With hyperlipidemia on crestor daily and at goal  she is on bASA With CKD she is on ACE/ARB She is on 70/30 insulin twice a day on 15 and 10 units.  Metformin 750 mg BID she checks her sugars with livongo- can not do continuous monitoring due to adhesive.  No low sugars.  Sugars 140-160- not drinking sweet tea  denies paresthesia of the feet, polydipsia, polyuria and visual disturbances. Last A1C in the office was:  Lab Results  Component Value Date   HGBA1C 11.4 (A) 05/07/2020   Lab Results  Component Value Date   CHOL 132 11/08/2019   HDL 52 11/08/2019   LDLCALC 57 11/08/2019   TRIG 148 11/08/2019   CHOLHDL 2.5 11/08/2019    Lab Results  Component Value Date   GFRNONAA 62 11/08/2019   Patient is on Vitamin D supplement.   Lab Results  Component Value Date   VD25OH 43 11/08/2019     She has anxiety/depression, is on xanax PRN and lexapro She has ADD and is on Vyvanse.  Patient has history of lynch syndrome, follows with Dr. PyrHilarie Fredricksonad partial prioctectomy with Dr. GroJohney Maine 07/19/2014.  They have also advised increase risk of other cancers including endometrial and ovarian cancer, she has not had a TAH due to recent cancer diagnosis in her husband.     Current Medications:    Current Outpatient Medications (Endocrine & Metabolic):  .  insulin NPH-regular Human (NOVOLIN 70/30) (70-30) 100 UNIT/ML injection, Inject 20 Units into the skin daily with breakfast AND 16 Units daily with supper. .  metFORMIN (GLUCOPHAGE-XR) 750 MG 24 hr tablet, Take 1 tablet 2 x /day with Meals for Diabetes .  Semaglutide (RYBELSUS) 3 MG TABS, Take 3 mg by mouth daily before breakfast.  Current Outpatient Medications (Cardiovascular):  .  losartan (COZAAR) 100 MG tablet, TAKE 1 TABLET DAILY FOR BP & DIABETIC KIDNEY PROTECTION .  rosuvastatin (CRESTOR) 20 MG tablet, TAKE 1 TABLET BY MOUTH EVERY DAY FOR CHOLESTEROL  Current Outpatient Medications (Respiratory):  .  albuterol (PROVENTIL HFA;VENTOLIN HFA) 108 (90 Base) MCG/ACT inhaler,  Use 1 to 2 Inhalations 15 minutes apart every 4 hours to Rescue Asthma (Patient taking differently: as needed.) .  fluticasone (FLONASE) 50 MCG/ACT nasal spray, Place 2 sprays into both nostrils daily. .Marland Kitchen  ipratropium (ATROVENT) 0.06 % nasal spray, Place 2 sprays into the nose 3 (three) times daily. Marland Kitchen  levocetirizine (XYZAL) 5 MG tablet, Take     1 tablet      Daily       for Allergies  Current Outpatient Medications (Analgesics):  .  acetaminophen (TYLENOL) 500 MG tablet, Take 1,000 mg by mouth daily as needed for moderate pain or headache. Marland Kitchen  aspirin 81 MG tablet, Take 81 mg by mouth daily. Marland Kitchen  ibuprofen (ADVIL,MOTRIN) 800 MG tablet, Take 1 tablet (800 mg total) by mouth every 8 (eight) hours as needed. .  traMADol (ULTRAM) 50 MG tablet, Take 1 tablet (50 mg total) by mouth every 6 (six) hours as needed.   Current Outpatient Medications (Other):  Marland Kitchen  ALPRAZolam (XANAX) 0.5 MG tablet, Take 1 tablet (0.5 mg total) by mouth 2 (two) times daily as needed. Marland Kitchen  amitriptyline (ELAVIL) 50 MG tablet, Take     1 tablet        at Bedtime       as needed for Sleep .  CHOLECALCIFEROL PO, Take 5,000 Units by mouth daily. Marland Kitchen  escitalopram  (LEXAPRO) 20 MG tablet, TAKE 1 TABLET BY MOUTH DAILY FOR MOOD .  glucose blood (ONETOUCH VERIO) test strip, Check blood sugar 3 times daily-DX-E11.65 .  Insulin Syringe-Needle U-100 (BD INSULIN SYRINGE U/F) 31G X 5/16" 1 ML MISC, Use twice a day .  Lancets (ONETOUCH ULTRASOFT) lancets, Use 3 lancets daily to check blood sugar-[id number] .  lisdexamfetamine (VYVANSE) 20 MG capsule, Take 1 capsule (20 mg total) by mouth daily. Marland Kitchen  olopatadine (PATANOL) 0.1 % ophthalmic solution, PLACE 1 DROP INTO BOTH EYES 2 (TWO) TIMES DAILY. Marland Kitchen  tiZANidine (ZANAFLEX) 2 MG tablet, TAKE 1 TABLET (2 MG TOTAL) BY MOUTH EVERY 6 (SIX) HOURS AS NEEDED FOR MUSCLE SPASMS.  Health Maintenance:   Immunization History  Administered Date(s) Administered  . Influenza Inj Mdck Quad With Preservative 01/22/2017, 12/30/2017, 01/16/2019, 01/08/2020  . Influenza Split 03/08/2014, 01/17/2015  . Influenza, Seasonal, Injecte, Preservative Fre 01/08/2016  . PPD Test 12/28/2013  . Pneumococcal Polysaccharide-23 04/22/2015  . Td 10/21/2005  . Tdap 08/03/2013   Tetanus: 2015 Pneumovax: 2017 Prevnar 13:  Age 19 Flu vaccine: 2021 Zostavax: declines  No LMP recorded (lmp unknown). Patient is postmenopausal. Pap: 09/2017- may get hysterectomy MGM: 04/2020 DEXA: Colonoscopy: 12/2019 yearly EGD: 05/2017 Stress test 2007 CXR 2009 Eye exam 09/2020 no retinopathy Dentist: 06/2020  Patient Care Team: Unk Pinto, MD as PCP - General (Internal Medicine) Richmond Campbell, MD as Consulting Physician (Gastroenterology) Jannette Spanner, MD as Referring Physician (Dermatology) Rachel Moulds, DO (Internal Medicine) Garald Balding, MD as Consulting Physician (Orthopedic Surgery) Rutherford Guys, MD as Consulting Physician (Ophthalmology) Stanford Breed Denice Bors, MD as Consulting Physician (Cardiology) Michael Boston, MD as Consulting Physician (General Surgery) Pyrtle, Lajuan Lines, MD as Consulting Physician (Gastroenterology)  Medical  History:  Past Medical History:  Diagnosis Date  . ADD (attention deficit disorder)   . Allergy   . Anxiety   . Arthritis   . Asthma    with severe allergies pt does use albuterol inhaler when needed  . Cataract   . Depression   . Diabetes mellitus without complication ([place]) 2620   TYPE 2   . GERD (gastroesophageal reflux disease)    hx of had Nissen Fundoplication 3559 has not had any issues since  . History of hiatal hernia   . Hyperlipidemia 2004  . Hypertension   . IBS (irritable  bowel syndrome)   . Lynch syndrome   . Tubular adenoma of colon    Allergies Allergies  Allergen Reactions  . Tape Rash  . Ace Inhibitors Cough  . Metformin And Related     myalgia  . Morphine And Related Itching  . Penicillins     Pt states that in 3rd grade she was given an injection in the buttock and reacted with a large hive in that area Pt can take oral penicillin with no reaction    . Wellbutrin [Bupropion]     Felt funny    SURGICAL HISTORY She  has a past surgical history that includes Hernia repair; Cholecystectomy; Tubal ligation; Cosmetic surgery; Rotator cuff repair (Right, 2012); Eye surgery (2006); Nissen fundoplication (2248); Carpal tunnel release (1998, 2001); Nasal sinus surgery; colonscopy ; Upper gi endoscopy; and Partial proctectomy by tem (N/A, 07/19/2014).   FAMILY HISTORY Her family history includes Breast cancer in her mother and sister; Cancer in her mother and paternal grandmother; Colon cancer (age of onset: 44) in her brother; Colon cancer (age of onset: 74) in her paternal grandfather; Colon cancer (age of onset: 24) in her father; Diabetes in her mother; Hyperlipidemia in her father and mother; Hypertension in her father and mother; Prostate cancer in her father; Skin cancer in her father.   SOCIAL HISTORY She  reports that she has never smoked. She has never used smokeless tobacco. She reports that she does not drink alcohol and does not use drugs.  Review of  Systems: Review of Systems  Constitutional: Negative for chills, diaphoresis, fever, malaise/fatigue and weight loss.  HENT: Negative for congestion, ear discharge, ear pain, hearing loss, nosebleeds, sinus pain, sore throat and tinnitus.        Dry mouth  Eyes: Negative for blurred vision, double vision, photophobia, pain, discharge and redness.  Respiratory: Negative for cough, hemoptysis, sputum production, shortness of breath, wheezing and stridor.   Cardiovascular: Negative for chest pain, palpitations, orthopnea, claudication, leg swelling and PND.  Gastrointestinal: Negative for abdominal pain, blood in stool, constipation, diarrhea, heartburn, melena, nausea and vomiting.  Genitourinary: Negative for dysuria, flank pain, frequency, hematuria and urgency.  Musculoskeletal: Negative for back pain, falls, joint pain, myalgias and neck pain.  Skin: Negative for itching and rash.  Neurological: Negative for dizziness, tingling, tremors, sensory change, speech change, focal weakness, seizures, loss of consciousness, weakness and headaches.  Endo/Heme/Allergies: Negative for environmental allergies and polydipsia. Does not bruise/bleed easily.  Psychiatric/Behavioral: Negative for depression, hallucinations, memory loss, substance abuse and suicidal ideas. The patient is not nervous/anxious and does not have insomnia.     Physical Exam: Estimated body mass index is 28.22 kg/m as calculated from the following:   Height as of this encounter: 5' 4.5" (1.638 m).   Weight as of this encounter: 167 lb (75.8 kg). BP 110/80   Pulse (!) 111   Temp (!) 97.3 F (36.3 C)   Ht 5' 4.5" (1.638 m)   Wt 167 lb (75.8 kg)   LMP  (LMP Unknown)   SpO2 97%   BMI 28.22 kg/m    General Appearance: Well nourished, in no apparent distress.  Eyes: PERRLA, EOMs, conjunctiva no swelling or erythema, normal fundi and vessels.  Sinuses: No Frontal/maxillary tenderness  ENT/Mouth: Ext aud canals clear, normal  light reflex with TMs without erythema, bulging.  No erythema, swelling, or exudate on post pharynx. Tonsils not swollen or erythematous. Hearing normal.  Neck: Supple, thyroid normal. No bruits  Respiratory: Respiratory effort  normal, BS equal bilaterally without rales, rhonchi, wheezing or stridor.  Cardio: RRR without murmurs, rubs or gallops. Brisk peripheral pulses without edema.  Chest: symmetric, with normal excursions and percussion.  Breasts: Defer, recent Mammo Abdomen: Soft, + epigastric tenderness, no guarding, rebound, hernias, masses, or organomegaly.  Lymphatics: Non tender without lymphadenopathy.  Genitourinary: defer Musculoskeletal: Full ROM all peripheral extremities,5/5 strength, and normal gait.  Skin: very mild erythematous rash along right flank. Warm, dry without rashes, lesions, ecchymosis. Neuro: Cranial nerves intact, reflexes equal bilaterally. Normal muscle tone, no cerebellar symptoms. Sensation intact.  Psych: Awake and oriented X 3, normal affect, Insight and Judgment appropriate.   EKG: tachycardia, no ST changes    Garnet Sierras, Laqueta Jean, DNP [facility] 06/18/2020  3:01 PM

## 2020-06-19 ENCOUNTER — Other Ambulatory Visit: Payer: Self-pay | Admitting: Adult Health Nurse Practitioner

## 2020-06-19 DIAGNOSIS — E1165 Type 2 diabetes mellitus with hyperglycemia: Secondary | ICD-10-CM

## 2020-06-19 DIAGNOSIS — F988 Other specified behavioral and emotional disorders with onset usually occurring in childhood and adolescence: Secondary | ICD-10-CM

## 2020-06-19 MED ORDER — LISDEXAMFETAMINE DIMESYLATE 20 MG PO CAPS: 20.0000 mg | ORAL_CAPSULE | Freq: Every day | ORAL | 0 refills | Status: DC

## 2020-06-19 MED ORDER — TRULICITY 0.75 MG/0.5ML ~~LOC~~ SOAJ: 0.7500 mg | SUBCUTANEOUS | 0 refills | Status: DC

## 2020-06-19 MED ORDER — GLIMEPIRIDE 4 MG PO TABS
4.0000 mg | ORAL_TABLET | Freq: Two times a day (BID) | ORAL | 2 refills | Status: DC
Start: 2020-06-19 — End: 2020-12-30

## 2020-06-19 NOTE — Progress Notes (Signed)
  Patient to STOP rybelsus. Start: Trulicity once a week injection 0.75mg /0.21ml Glimepride 4mg  BID Continue 70/30, now 20untis in PM and 18units PM Continue Metformin 750mg  BID    Bayard Males, DNP [facility] 06/19/2020  6:11 PM

## 2020-06-20 LAB — URINALYSIS W MICROSCOPIC + REFLEX CULTURE
Bilirubin Urine: NEGATIVE
Hgb urine dipstick: NEGATIVE
Hyaline Cast: NONE SEEN /LPF
Nitrites, Initial: NEGATIVE
Protein, ur: NEGATIVE
RBC / HPF: NONE SEEN /HPF (ref 0–2)
Specific Gravity, Urine: 1.025 (ref 1.001–1.03)
pH: <=5 (ref 5.0–8.0)

## 2020-06-20 LAB — COMPLETE METABOLIC PANEL WITH GFR
AG Ratio: 1.5 (calc) (ref 1.0–2.5)
ALT: 27 U/L (ref 6–29)
AST: 18 U/L (ref 10–35)
Albumin: 4.1 g/dL (ref 3.6–5.1)
Alkaline phosphatase (APISO): 97 U/L (ref 37–153)
BUN: 14 mg/dL (ref 7–25)
CO2: 26 mmol/L (ref 20–32)
Calcium: 10.2 mg/dL (ref 8.6–10.4)
Chloride: 104 mmol/L (ref 98–110)
Creat: 0.82 mg/dL (ref 0.50–0.99)
GFR, Est African American: 88 mL/min/{1.73_m2} (ref >=60)
GFR, Est Non African American: 76 mL/min/{1.73_m2} (ref >=60)
Globulin: 2.8 g/dL (calc) (ref 1.9–3.7)
Glucose, Bld: 189 mg/dL — ABNORMAL HIGH (ref 65–99)
Potassium: 4.3 mmol/L (ref 3.5–5.3)
Sodium: 140 mmol/L (ref 135–146)
Total Bilirubin: 0.5 mg/dL (ref 0.2–1.2)
Total Protein: 6.9 g/dL (ref 6.1–8.1)

## 2020-06-20 LAB — LIPID PANEL
Cholesterol: 102 mg/dL (ref <200)
HDL: 45 mg/dL — ABNORMAL LOW (ref >=50)
LDL Cholesterol (Calc): 39 mg/dL (calc)
Non-HDL Cholesterol (Calc): 57 mg/dL (calc) (ref <130)
Total CHOL/HDL Ratio: 2.3 (calc) (ref <5.0)
Triglycerides: 98 mg/dL (ref <150)

## 2020-06-20 LAB — HEMOGLOBIN A1C
Hgb A1c MFr Bld: 10.9 % of total Hgb — ABNORMAL HIGH (ref <5.7)
Mean Plasma Glucose: 266 mg/dL
eAG (mmol/L): 14.7 mmol/L

## 2020-06-20 LAB — CBC WITH DIFFERENTIAL/PLATELET
Absolute Monocytes: 525 cells/uL (ref 200–950)
Basophils Absolute: 90 cells/uL (ref 0–200)
Basophils Relative: 1.2 %
Eosinophils Absolute: 360 cells/uL (ref 15–500)
Eosinophils Relative: 4.8 %
HCT: 42.1 % (ref 35.0–45.0)
Hemoglobin: 13.6 g/dL (ref 11.7–15.5)
Lymphs Abs: 2985 cells/uL (ref 850–3900)
MCH: 27.5 pg (ref 27.0–33.0)
MCHC: 32.3 g/dL (ref 32.0–36.0)
MCV: 85.1 fL (ref 80.0–100.0)
MPV: 10.6 fL (ref 7.5–12.5)
Monocytes Relative: 7 %
Neutro Abs: 3540 cells/uL (ref 1500–7800)
Neutrophils Relative %: 47.2 %
Platelets: 358 10*3/uL (ref 140–400)
RBC: 4.95 10*6/uL (ref 3.80–5.10)
RDW: 13.1 % (ref 11.0–15.0)
Total Lymphocyte: 39.8 %
WBC: 7.5 10*3/uL (ref 3.8–10.8)

## 2020-06-20 LAB — MAGNESIUM: Magnesium: 1.7 mg/dL (ref 1.5–2.5)

## 2020-06-20 LAB — URINE CULTURE
MICRO NUMBER:: 11651509
SPECIMEN QUALITY:: ADEQUATE

## 2020-06-20 LAB — VITAMIN D 25 HYDROXY (VIT D DEFICIENCY, FRACTURES): Vit D, 25-Hydroxy: 51 ng/mL (ref 30–100)

## 2020-06-20 LAB — IRON, TOTAL/TOTAL IRON BINDING CAP
%SAT: 28 % (calc) (ref 16–45)
Iron: 93 ug/dL (ref 45–160)
TIBC: 329 mcg/dL (calc) (ref 250–450)

## 2020-06-20 LAB — TSH: TSH: 4.21 mIU/L (ref 0.40–4.50)

## 2020-06-20 LAB — VITAMIN B12: Vitamin B-12: 760 pg/mL (ref 200–1100)

## 2020-06-20 LAB — CULTURE INDICATED

## 2020-06-27 ENCOUNTER — Other Ambulatory Visit: Payer: Self-pay | Admitting: Adult Health Nurse Practitioner

## 2020-06-27 ENCOUNTER — Telehealth: Payer: Self-pay | Admitting: Adult Health Nurse Practitioner

## 2020-06-27 DIAGNOSIS — E1165 Type 2 diabetes mellitus with hyperglycemia: Secondary | ICD-10-CM

## 2020-06-27 MED ORDER — TRULICITY 0.75 MG/0.5ML ~~LOC~~ SOAJ: 0.7500 mg | SUBCUTANEOUS | 3 refills | Status: DC

## 2020-06-27 NOTE — Telephone Encounter (Signed)
patient called to request Trulicity in 90 day supply,  instead of current 30 day supply. Trulicity 30 day supply is $120, her insurance recommended 90 day supply. She requested change from [place], [place], and was told they would send a request to our office today. Please advise.

## 2020-07-11 ENCOUNTER — Other Ambulatory Visit: Payer: Self-pay | Admitting: Internal Medicine

## 2020-07-11 DIAGNOSIS — Z79899 Other long term (current) drug therapy: Secondary | ICD-10-CM

## 2020-08-06 ENCOUNTER — Ambulatory Visit: Payer: 59 | Admitting: Internal Medicine

## 2020-08-19 ENCOUNTER — Other Ambulatory Visit: Payer: Self-pay

## 2020-08-19 DIAGNOSIS — E785 Hyperlipidemia, unspecified: Secondary | ICD-10-CM

## 2020-08-19 MED ORDER — ROSUVASTATIN CALCIUM 20 MG PO TABS: ORAL_TABLET | ORAL | 3 refills | Status: DC

## 2020-09-06 ENCOUNTER — Other Ambulatory Visit: Payer: Self-pay | Admitting: Internal Medicine

## 2020-09-24 ENCOUNTER — Other Ambulatory Visit: Payer: Self-pay | Admitting: Internal Medicine

## 2020-09-24 ENCOUNTER — Other Ambulatory Visit: Payer: Self-pay

## 2020-09-24 DIAGNOSIS — F330 Major depressive disorder, recurrent, mild: Secondary | ICD-10-CM

## 2020-09-24 MED ORDER — ESCITALOPRAM OXALATE 20 MG PO TABS: ORAL_TABLET | ORAL | 1 refills | Status: DC

## 2020-09-25 ENCOUNTER — Telehealth: Payer: Self-pay

## 2020-09-25 ENCOUNTER — Other Ambulatory Visit: Payer: Self-pay | Admitting: Adult Health

## 2020-09-25 DIAGNOSIS — F988 Other specified behavioral and emotional disorders with onset usually occurring in childhood and adolescence: Secondary | ICD-10-CM

## 2020-09-25 MED ORDER — LISDEXAMFETAMINE DIMESYLATE 20 MG PO CAPS: 20.0000 mg | ORAL_CAPSULE | Freq: Every day | ORAL | 0 refills | Status: DC

## 2020-09-25 NOTE — Progress Notes (Signed)
Future Appointments  Date Time Provider [place]  06/18/2021  2:00 PM Garnet Sierras, NP [facility] None    PDMP reviewed for vyvanse refill request.

## 2020-09-25 NOTE — Telephone Encounter (Signed)
Refill request for Vyvanse.

## 2020-10-10 ENCOUNTER — Other Ambulatory Visit: Payer: Self-pay | Admitting: Internal Medicine

## 2020-10-10 DIAGNOSIS — Z79899 Other long term (current) drug therapy: Secondary | ICD-10-CM

## 2020-11-30 ENCOUNTER — Other Ambulatory Visit: Payer: Self-pay | Admitting: Adult Health Nurse Practitioner

## 2020-11-30 DIAGNOSIS — Z794 Long term (current) use of insulin: Secondary | ICD-10-CM

## 2020-11-30 DIAGNOSIS — E1165 Type 2 diabetes mellitus with hyperglycemia: Secondary | ICD-10-CM

## 2020-12-27 ENCOUNTER — Other Ambulatory Visit: Payer: Self-pay | Admitting: Adult Health

## 2020-12-27 ENCOUNTER — Telehealth: Payer: Self-pay

## 2020-12-27 DIAGNOSIS — F988 Other specified behavioral and emotional disorders with onset usually occurring in childhood and adolescence: Secondary | ICD-10-CM

## 2020-12-27 MED ORDER — LISDEXAMFETAMINE DIMESYLATE 20 MG PO CAPS: ORAL_CAPSULE | ORAL | 0 refills | Status: DC

## 2020-12-27 NOTE — Progress Notes (Signed)
FOLLOW UP  Assessment and Plan:   Hypertension Well controlled with current medications  Monitor blood pressure at home; patient to call if consistently greater than 130/80 Continue DASH diet.   Reminder to go to the ER if any CP, SOB, nausea, dizziness, severe HA, changes vision/speech, left arm numbness and tingling and jaw pain.  Hyperlipidemia associated with type 2 diabetes mellitus (HCC) -     COMPLETE METABOLIC PANEL WITH GFR -     CBC with Differential/Platelet -     Lipid panel -     TSH Currently at goal;  Continue low cholesterol diet and exercise.    Diabetes with diabetic neuropathy, unspecified Start: Trulicity once a week injection 0.75mg /0.33ml Stop Glimepiride Continue 70/30, now 20untis in PM and 18units PM Continue Metformin 750mg  BID Check blood sugars daily.   Once blood sugars are less than 150 regularly will start to decrease insulin. Continue diet and exercise Check A1C CBC CMP  Severe obesity (BMI 35.0-39.9) with comorbidity ([place]) Long discussion about weight loss, diet, and exercise Recommended diet heavy in fruits and veggies and low in animal meats, cheeses, and dairy products, appropriate calorie intake  Vitamin D Def At goal at last visit; continue supplementation to maintain goal of 60-100  Vit D level  Mild episode of recurrent major depressive disorder (HCC) Continue Lexapro and monitor symptoms Continue diet and exercise  Attention deficit disorder, unspecified hyperactivity presence Continue Vyvanse 20 mg qd , try to not take on weekends Monitor symptoms  Screening for hematuria or proteinuria -     Urinalysis, Routine w reflex microscopic -     Microalbumin / creatinine urine ratio  Encounter for screening for COVID-19 -     POC COVID-19  URI, acute -     azithromycin (ZITHROMAX) 250 MG tablet; Take 2 tablets (500 mg) on  Day 1,  followed by 1 tablet (250 mg) once daily on Days 2 through 5. -     benzonatate (TESSALON  PERLES) 100 MG capsule; Take 2 capsules (200 mg total) by mouth 3 (three) times daily as needed for cough (Max: 600mg  per day). -     promethazine-dextromethorphan (PROMETHAZINE-DM) 6.25-15 MG/5ML syrup; Take 5 mLs by mouth 4 (four) times daily as needed for cough. Monitor symptoms, if no improvement call the office   Continue diet and meds as discussed. Further disposition pending results of labs. Discussed med's effects and SE's.   Over 30 minutes of exam, counseling, chart review, and critical decision making was performed.   Future Appointments  Date Time Provider [place]  06/18/2021  2:00 PM Magda Bernheim, NP [facility] None    ----------------------------------------------------------------------------------------------------------------------  HPI 65 y.o. female  presents for 3 month follow up on hypertension, cholesterol, diabetes, weight and ADHD.   Starting 3 weeks ago developed a cough, has turned to thick green productive cough yesterday.  Low grade fever 99, nasal congestion.  Denies nausea , vomiting, diarrhea, muscle aches. Has tried using Mucinex with no change in symptoms  BMI is Body mass index is 30.32 kg/m., she has not been working on diet and exercise. Wt Readings from Last 3 Encounters:  12/30/20 179 lb 6.4 oz (81.4 kg)  06/18/20 167 lb (75.8 kg)  05/07/20 164 lb 8 oz (74.6 kg)    Her blood pressure has been controlled at home, today their BP is BP: 120/62 BP Readings from Last 3 Encounters:  12/30/20 120/62  06/18/20 110/80  05/07/20 120/72     She does  not workout. She denies chest pain, shortness of breath, dizziness.   She is on cholesterol medication Rosuvastatin and denies myalgias. Her cholesterol is at goal. The cholesterol last visit was:   Lab Results  Component Value Date   CHOL 102 06/18/2020   HDL 45 (L) 06/18/2020   LDLCALC 39 06/18/2020   TRIG 98 06/18/2020   CHOLHDL 2.3 06/18/2020    She has not been working on diet and  exercise for prediabetes, and reports  paresthesia of the feet. Currently on Glimepiride 4 mg BID, Metformin XR 750mg  BID and Novolin 70/30 20 in am 18 at night. Never started the trulicity but is willing .  Wants to be on less medication. Fasting blood sugars between 100-200.   Last A1C in the office was:  Lab Results  Component Value Date   HGBA1C 10.9 (H) 06/18/2020   Patient is on Vitamin D supplement.   Lab Results  Component Value Date   VD25OH 51 06/18/2020        Current Medications:  Current Outpatient Medications on File Prior to Visit  Medication Sig   acetaminophen (TYLENOL) 500 MG tablet Take 1,000 mg by mouth daily as needed for moderate pain or headache.   albuterol (PROVENTIL HFA;VENTOLIN HFA) 108 (90 Base) MCG/ACT inhaler  Use 1 to 2 Inhalations 15 minutes apart every 4 hours to Rescue Asthma (Patient taking differently: as needed.)   ALPRAZolam (XANAX) 0.5 MG tablet Take 1 tablet (0.5 mg total) by mouth 2 (two) times daily as needed.   aspirin 81 MG tablet Take 81 mg by mouth daily.   CHOLECALCIFEROL PO Take 5,000 Units by mouth daily.   escitalopram (LEXAPRO) 20 MG tablet TAKE 1 TABLET BY MOUTH DAILY FOR MOOD   fluticasone (FLONASE) 50 MCG/ACT nasal spray Place 2 sprays into both nostrils daily.   glimepiride (AMARYL) 4 MG tablet Take 1 tablet (4 mg total) by mouth 2 (two) times daily.   glucose blood (ONETOUCH VERIO) test strip Check blood sugar 3 times daily-DX-E11.65   ibuprofen (ADVIL,MOTRIN) 800 MG tablet Take 1 tablet (800 mg total) by mouth every 8 (eight) hours as needed.   Insulin Syringe-Needle U-100 (BD INSULIN SYRINGE U/F) 31G X 5/16" 1 ML MISC Use twice a day   Lancets (ONETOUCH ULTRASOFT) lancets Use 3 lancets daily to check blood sugar-DX-E11.65   levocetirizine (XYZAL) 5 MG tablet Take  1 tablet  Daily  for Allergies   lisdexamfetamine (VYVANSE) 20 MG capsule Take 1 tab daily in the morning as needed, avoid taking daily due to risk for tolerance. 30  tabs should last longer than a month.   losartan (COZAAR) 100 MG tablet TAKE 1 TABLET DAILY FOR BP & DIABETIC KIDNEY PROTECTION   metFORMIN (GLUCOPHAGE-XR) 750 MG 24 hr tablet Take 1 tablet 2 x /day with Meals for Diabetes   NOVOLIN 70/30 (70-30) 100 UNIT/ML injection INJECT 25 UNITS IN THE AM AND 20 UNITS AT DINNER   olopatadine (PATANOL) 0.1 % ophthalmic solution PLACE 1 DROP INTO BOTH EYES 2 (TWO) TIMES DAILY.   rosuvastatin (CRESTOR) 20 MG tablet TAKE 1 TABLET BY MOUTH EVERY DAY FOR CHOLESTEROL   tiZANidine (ZANAFLEX) 2 MG tablet TAKE 1 TABLET (2 MG TOTAL) BY MOUTH EVERY 6 (SIX) HOURS AS NEEDED FOR MUSCLE SPASMS.   amitriptyline (ELAVIL) 50 MG tablet TAKE 1 TABLET BY MOUTH EVERY DAY AT BEDTIME AS NEEDED FOR SLEEP (Patient not taking: Reported on 12/30/2020)   Dulaglutide (TRULICITY) 6.60 YT/0.1SW SOPN Inject 0.75  mg weekly  (  Dx:  e11.29) (Patient not taking: Reported on 12/30/2020)   ipratropium (ATROVENT) 0.06 % nasal spray Place 2 sprays into the nose 3 (three) times daily.   traMADol (ULTRAM) 50 MG tablet Take 1 tablet (50 mg total) by mouth every 6 (six) hours as needed. (Patient not taking: Reported on 12/30/2020)   No current facility-administered medications on file prior to visit.     Allergies:  Allergies  Allergen Reactions   Tape Rash   Ace Inhibitors Cough   Metformin And Related     myalgia   Morphine And Related Itching   Penicillins     Pt states that in 3rd grade she was given an injection in the buttock and reacted with a large hive in that area Pt can take oral penicillin with no reaction     Wellbutrin [Bupropion]     Felt funny     Medical History:  Past Medical History:  Diagnosis Date   ADD (attention deficit disorder)    Allergy    Anxiety    Arthritis    Asthma    with severe allergies pt does use albuterol inhaler when needed   Cataract    Depression    Diabetes mellitus without complication ([place]) 9373   TYPE 2    GERD (gastroesophageal reflux  disease)    hx of had Nissen Fundoplication 4287 has not had any issues since   History of hiatal hernia    Hyperlipidemia 2004   Hypertension    IBS (irritable bowel syndrome)    Lynch syndrome    Tubular adenoma of colon    Family history- Reviewed and unchanged Social history- Reviewed and unchanged   Review of Systems:  Review of Systems  Constitutional:  Positive for fever (low grade). Negative for chills.  HENT:  Positive for congestion. Negative for hearing loss, sinus pain, sore throat and tinnitus.   Eyes:  Negative for blurred vision and double vision.  Respiratory:  Positive for cough (productive green mucus). Negative for hemoptysis, sputum production, shortness of breath and wheezing.   Cardiovascular:  Negative for chest pain, palpitations and leg swelling.  Gastrointestinal:  Negative for abdominal pain, constipation, diarrhea, heartburn, nausea and vomiting.  Genitourinary:  Negative for dysuria and urgency.  Musculoskeletal:  Negative for back pain, falls, joint pain, myalgias and neck pain.  Skin:  Negative for rash.  Neurological:  Positive for tingling (feet bilaterally). Negative for dizziness, tremors, weakness and headaches.  Endo/Heme/Allergies:  Does not bruise/bleed easily.  Psychiatric/Behavioral:  Positive for depression (controlled with meds). Negative for suicidal ideas. The patient is not nervous/anxious and does not have insomnia.      Physical Exam: BP 120/62   Pulse 93   Temp 97.7 F (36.5 C)   Wt 179 lb 6.4 oz (81.4 kg)   LMP  (LMP Unknown)   SpO2 98%   BMI 30.32 kg/m  Wt Readings from Last 3 Encounters:  12/30/20 179 lb 6.4 oz (81.4 kg)  06/18/20 167 lb (75.8 kg)  05/07/20 164 lb 8 oz (74.6 kg)   General Appearance: Well nourished, in no apparent distress. Eyes: PERRLA, EOMs, conjunctiva no swelling or erythema Sinuses: No Frontal/maxillary tenderness ENT/Mouth: Ext aud canals clear, TMs without erythema, bulging. No erythema,  swelling, or exudate on post pharynx.  Tonsils not swollen or erythematous. Hearing normal.  Neck: Supple, thyroid normal.  Respiratory: Respiratory effort normal, BS equal bilaterally without rales, rhonchi, wheezing or stridor. Cough noted Cardio: RRR with no MRGs. Brisk peripheral  pulses without edema.  Abdomen: Soft, + BS.  Non tender, no guarding, rebound, hernias, masses. Lymphatics: Non tender without lymphadenopathy.  Musculoskeletal: Full ROM, 5/5 strength, Normal gait Skin: Warm, dry without rashes, lesions, ecchymosis.  Neuro: Cranial nerves intact. No cerebellar symptoms.  Psych: Awake and oriented X 3, normal affect, Insight and Judgment appropriate.    Magda Bernheim, NP 12:09 PM [facility]

## 2020-12-27 NOTE — Progress Notes (Signed)
Future Appointments  Date Time Provider [place]  12/30/2020 11:30 AM Magda Bernheim, NP [facility] None  06/18/2021  2:00 PM Magda Bernheim, NP [facility] None   PDMP reviewed for vyvanse refill request.

## 2020-12-27 NOTE — Telephone Encounter (Signed)
Refill request for Vyvanse.

## 2020-12-30 ENCOUNTER — Ambulatory Visit (INDEPENDENT_AMBULATORY_CARE_PROVIDER_SITE_OTHER): Payer: 59 | Admitting: Nurse Practitioner

## 2020-12-30 ENCOUNTER — Encounter: Payer: Self-pay | Admitting: Nurse Practitioner

## 2020-12-30 ENCOUNTER — Other Ambulatory Visit: Payer: Self-pay

## 2020-12-30 VITALS — BP 120/62 | HR 93 | Temp 97.7°F | Wt 179.4 lb

## 2020-12-30 DIAGNOSIS — E785 Hyperlipidemia, unspecified: Secondary | ICD-10-CM | POA: Diagnosis not present

## 2020-12-30 DIAGNOSIS — Z1152 Encounter for screening for COVID-19: Secondary | ICD-10-CM | POA: Diagnosis not present

## 2020-12-30 DIAGNOSIS — E1169 Type 2 diabetes mellitus with other specified complication: Secondary | ICD-10-CM

## 2020-12-30 DIAGNOSIS — I10 Essential (primary) hypertension: Secondary | ICD-10-CM | POA: Diagnosis not present

## 2020-12-30 DIAGNOSIS — Z794 Long term (current) use of insulin: Secondary | ICD-10-CM

## 2020-12-30 DIAGNOSIS — E1165 Type 2 diabetes mellitus with hyperglycemia: Secondary | ICD-10-CM

## 2020-12-30 DIAGNOSIS — Z1389 Encounter for screening for other disorder: Secondary | ICD-10-CM

## 2020-12-30 DIAGNOSIS — E559 Vitamin D deficiency, unspecified: Secondary | ICD-10-CM | POA: Diagnosis not present

## 2020-12-30 DIAGNOSIS — J069 Acute upper respiratory infection, unspecified: Secondary | ICD-10-CM

## 2020-12-30 DIAGNOSIS — F988 Other specified behavioral and emotional disorders with onset usually occurring in childhood and adolescence: Secondary | ICD-10-CM

## 2020-12-30 DIAGNOSIS — F330 Major depressive disorder, recurrent, mild: Secondary | ICD-10-CM

## 2020-12-30 LAB — POC COVID19 BINAXNOW: SARS Coronavirus 2 Ag: NEGATIVE

## 2020-12-30 MED ORDER — AZITHROMYCIN 250 MG PO TABS: ORAL_TABLET | ORAL | 1 refills | Status: DC

## 2020-12-30 MED ORDER — BENZONATATE 100 MG PO CAPS: 200.0000 mg | ORAL_CAPSULE | Freq: Three times a day (TID) | ORAL | 0 refills | Status: DC | PRN

## 2020-12-30 MED ORDER — PROMETHAZINE-DM 6.25-15 MG/5ML PO SYRP: 5.0000 mL | ORAL_SOLUTION | Freq: Four times a day (QID) | ORAL | 1 refills | Status: DC | PRN

## 2020-12-30 NOTE — Patient Instructions (Signed)
Start: Trulicity once a week injection 0.75mg /0.22ml Stop Glimepiride Continue 70/30, now 20untis in PM and 18units PM Continue Metformin 750mg  BID Check blood sugars daily.   Once blood sugars are less than 150 regularly will start to decrease insulin. Continue diet and exercise

## 2020-12-31 ENCOUNTER — Other Ambulatory Visit: Payer: Self-pay | Admitting: Nurse Practitioner

## 2020-12-31 ENCOUNTER — Telehealth: Payer: Self-pay

## 2020-12-31 DIAGNOSIS — F988 Other specified behavioral and emotional disorders with onset usually occurring in childhood and adolescence: Secondary | ICD-10-CM

## 2020-12-31 LAB — CBC WITH DIFFERENTIAL/PLATELET
Absolute Monocytes: 518 cells/uL (ref 200–950)
Basophils Absolute: 131 cells/uL (ref 0–200)
Basophils Relative: 1.8 %
Eosinophils Absolute: 358 cells/uL (ref 15–500)
Eosinophils Relative: 4.9 %
HCT: 43.3 % (ref 35.0–45.0)
Hemoglobin: 13.8 g/dL (ref 11.7–15.5)
Lymphs Abs: 1643 cells/uL (ref 850–3900)
MCH: 27.3 pg (ref 27.0–33.0)
MCHC: 31.9 g/dL — ABNORMAL LOW (ref 32.0–36.0)
MCV: 85.6 fL (ref 80.0–100.0)
MPV: 11.6 fL (ref 7.5–12.5)
Monocytes Relative: 7.1 %
Neutro Abs: 4650 cells/uL (ref 1500–7800)
Neutrophils Relative %: 63.7 %
Platelets: 327 10*3/uL (ref 140–400)
RBC: 5.06 10*6/uL (ref 3.80–5.10)
RDW: 12.6 % (ref 11.0–15.0)
Total Lymphocyte: 22.5 %
WBC: 7.3 10*3/uL (ref 3.8–10.8)

## 2020-12-31 LAB — LIPID PANEL
Cholesterol: 115 mg/dL (ref <200)
HDL: 46 mg/dL — ABNORMAL LOW (ref >=50)
LDL Cholesterol (Calc): 45 mg/dL (calc)
Non-HDL Cholesterol (Calc): 69 mg/dL (calc) (ref <130)
Total CHOL/HDL Ratio: 2.5 (calc) (ref <5.0)
Triglycerides: 162 mg/dL — ABNORMAL HIGH (ref <150)

## 2020-12-31 LAB — URINALYSIS, ROUTINE W REFLEX MICROSCOPIC
Bacteria, UA: NONE SEEN /HPF
Bilirubin Urine: NEGATIVE
Hgb urine dipstick: NEGATIVE
Hyaline Cast: NONE SEEN /LPF
Ketones, ur: NEGATIVE
Nitrite: NEGATIVE
Protein, ur: NEGATIVE
RBC / HPF: NONE SEEN /HPF (ref 0–2)
Specific Gravity, Urine: 1.021 (ref 1.001–1.035)
pH: 5.5 (ref 5.0–8.0)

## 2020-12-31 LAB — COMPLETE METABOLIC PANEL WITH GFR
AG Ratio: 1.5 (calc) (ref 1.0–2.5)
ALT: 19 U/L (ref 6–29)
AST: 19 U/L (ref 10–35)
Albumin: 4.1 g/dL (ref 3.6–5.1)
Alkaline phosphatase (APISO): 84 U/L (ref 37–153)
BUN: 13 mg/dL (ref 7–25)
CO2: 26 mmol/L (ref 20–32)
Calcium: 9.8 mg/dL (ref 8.6–10.4)
Chloride: 102 mmol/L (ref 98–110)
Creat: 0.78 mg/dL (ref 0.50–1.05)
Globulin: 2.7 g/dL (calc) (ref 1.9–3.7)
Glucose, Bld: 315 mg/dL — ABNORMAL HIGH (ref 65–99)
Potassium: 4.3 mmol/L (ref 3.5–5.3)
Sodium: 138 mmol/L (ref 135–146)
Total Bilirubin: 0.7 mg/dL (ref 0.2–1.2)
Total Protein: 6.8 g/dL (ref 6.1–8.1)
eGFR: 85 mL/min/{1.73_m2} (ref >=60)

## 2020-12-31 LAB — MICROALBUMIN / CREATININE URINE RATIO
Creatinine, Urine: 74 mg/dL (ref 20–275)
Microalb Creat Ratio: 7 mcg/mg creat (ref <30)
Microalb, Ur: 0.5 mg/dL

## 2020-12-31 LAB — HEMOGLOBIN A1C
Hgb A1c MFr Bld: 8.6 % of total Hgb — ABNORMAL HIGH (ref <5.7)
Mean Plasma Glucose: 200 mg/dL
eAG (mmol/L): 11.1 mmol/L

## 2020-12-31 LAB — MICROSCOPIC MESSAGE

## 2020-12-31 LAB — TSH: TSH: 3.27 mIU/L (ref 0.40–4.50)

## 2020-12-31 LAB — VITAMIN D 25 HYDROXY (VIT D DEFICIENCY, FRACTURES): Vit D, 25-Hydroxy: 35 ng/mL (ref 30–100)

## 2020-12-31 MED ORDER — LISDEXAMFETAMINE DIMESYLATE 20 MG PO CAPS: ORAL_CAPSULE | ORAL | 0 refills | Status: DC

## 2020-12-31 NOTE — Progress Notes (Signed)
Sent in 30 days but patient gets 90 day supply.  Resent 90 day supply of Vyvanse 20 mg qd to [facility]

## 2020-12-31 NOTE — Telephone Encounter (Signed)
Patient called and said her vyvanse is supposed to be a 90 supply and only a 30 day supply was filled.

## 2020-12-31 NOTE — Telephone Encounter (Signed)
Resent for 90 days

## 2021-01-17 ENCOUNTER — Other Ambulatory Visit: Payer: Self-pay | Admitting: Internal Medicine

## 2021-01-27 ENCOUNTER — Other Ambulatory Visit: Payer: Self-pay | Admitting: Adult Health Nurse Practitioner

## 2021-01-27 DIAGNOSIS — Z794 Long term (current) use of insulin: Secondary | ICD-10-CM

## 2021-03-08 ENCOUNTER — Other Ambulatory Visit: Payer: Self-pay | Admitting: Adult Health Nurse Practitioner

## 2021-03-08 ENCOUNTER — Other Ambulatory Visit: Payer: Self-pay | Admitting: Internal Medicine

## 2021-03-08 DIAGNOSIS — Z794 Long term (current) use of insulin: Secondary | ICD-10-CM

## 2021-03-08 DIAGNOSIS — F330 Major depressive disorder, recurrent, mild: Secondary | ICD-10-CM

## 2021-03-08 DIAGNOSIS — E1165 Type 2 diabetes mellitus with hyperglycemia: Secondary | ICD-10-CM

## 2021-03-13 ENCOUNTER — Other Ambulatory Visit: Payer: Self-pay | Admitting: Nurse Practitioner

## 2021-03-13 DIAGNOSIS — F419 Anxiety disorder, unspecified: Secondary | ICD-10-CM

## 2021-03-13 MED ORDER — ALPRAZOLAM 0.5 MG PO TABS
0.5000 mg | ORAL_TABLET | Freq: Three times a day (TID) | ORAL | 0 refills | Status: DC | PRN
Start: 2021-03-13 — End: 2022-01-28

## 2021-04-02 NOTE — Progress Notes (Deleted)
FOLLOW UP  Assessment and Plan:   Hypertension Well controlled with current medications  Monitor blood pressure at home; patient to call if consistently greater than 130/80 Continue DASH diet.   Reminder to go to the ER if any CP, SOB, nausea, dizziness, severe HA, changes vision/speech, left arm numbness and tingling and jaw pain.  Hyperlipidemia associated with type 2 diabetes mellitus (HCC) -     COMPLETE METABOLIC PANEL WITH GFR -     CBC with Differential/Platelet -     Lipid panel -     TSH Currently at goal;  Continue low cholesterol diet and exercise.    Diabetes with diabetic neuropathy, unspecified Start: Trulicity once a week injection 0.75mg /0.24ml Stop Glimepiride Continue 70/30, now 20untis in PM and 18units PM Continue Metformin 750mg  BID Check blood sugars daily.   Once blood sugars are less than 150 regularly will start to decrease insulin. Continue diet and exercise Check A1C CBC CMP  Severe obesity (BMI 35.0-39.9) with comorbidity ([place]) Long discussion about weight loss, diet, and exercise Recommended diet heavy in fruits and veggies and low in animal meats, cheeses, and dairy products, appropriate calorie intake  Vitamin D Def At goal at last visit; continue supplementation to maintain goal of 60-100  Vit D level  Mild episode of recurrent major depressive disorder (HCC) Continue Lexapro and monitor symptoms Continue diet and exercise  Attention deficit disorder, unspecified hyperactivity presence Continue Vyvanse 20 mg qd , try to not take on weekends Monitor symptoms       Continue diet and meds as discussed. Further disposition pending results of labs. Discussed med's effects and SE's.   Over 30 minutes of exam, counseling, chart review, and critical decision making was performed.   Future Appointments  Date Time Provider [place]  04/03/2021 10:00 AM Magda Bernheim, NP [facility] None  06/18/2021  2:00 PM Magda Bernheim, NP  [facility] None    ----------------------------------------------------------------------------------------------------------------------  HPI 65 y.o. female  presents for 3 month follow up on hypertension, cholesterol, diabetes, weight and ADHD.   Starting 3 weeks ago developed a cough, has turned to thick green productive cough yesterday.  Low grade fever 99, nasal congestion.  Denies nausea , vomiting, diarrhea, muscle aches. Has tried using Mucinex with no change in symptoms  BMI is There is no height or weight on file to calculate BMI., she has not been working on diet and exercise. Wt Readings from Last 3 Encounters:  12/30/20 179 lb 6.4 oz (81.4 kg)  06/18/20 167 lb (75.8 kg)  05/07/20 164 lb 8 oz (74.6 kg)    Her blood pressure has been controlled at home, today their BP is   BP Readings from Last 3 Encounters:  12/30/20 120/62  06/18/20 110/80  05/07/20 120/72     She does not workout. She denies chest pain, shortness of breath, dizziness.   She is on cholesterol medication Rosuvastatin and denies myalgias. Her cholesterol is at goal. The cholesterol last visit was:   Lab Results  Component Value Date   CHOL 115 12/30/2020   HDL 46 (L) 12/30/2020   LDLCALC 45 12/30/2020   TRIG 162 (H) 12/30/2020   CHOLHDL 2.5 12/30/2020    She has not been working on diet and exercise for prediabetes, and reports  paresthesia of the feet. Currently on Glimepiride 4 mg BID, Metformin XR 750mg  BID and Novolin 70/30 20 in am 18 at night. Never started the trulicity but is willing .  Wants to be  on less medication. Fasting blood sugars between 100-200.   Last A1C in the office was:  Lab Results  Component Value Date   HGBA1C 8.6 (H) 12/30/2020   Patient is on Vitamin D supplement.   Lab Results  Component Value Date   VD25OH 35 12/30/2020        Current Medications:  Current Outpatient Medications on File Prior to Visit  Medication Sig   acetaminophen (TYLENOL) 500 MG tablet  Take 1,000 mg by mouth daily as needed for moderate pain or headache.   ALPRAZolam (XANAX) 0.5 MG tablet Take 1 tablet (0.5 mg total) by mouth 3 (three) times daily as needed.   aspirin 81 MG tablet Take 81 mg by mouth daily.   azithromycin (ZITHROMAX) 250 MG tablet Take 2 tablets (500 mg) on  Day 1,  followed by 1 tablet (250 mg) once daily on Days 2 through 5.   BD INSULIN SYRINGE U/F 31G X 5/16" 1 ML MISC USE AS DIRECTED TWICE A DAY   benzonatate (TESSALON PERLES) 100 MG capsule Take 2 capsules (200 mg total) by mouth 3 (three) times daily as needed for cough (Max: 600mg  per day).   CHOLECALCIFEROL PO Take 5,000 Units by mouth daily.   Dulaglutide (TRULICITY) 9.93 ZJ/6.9CV SOPN Inject 0.75  mg weekly  ( Dx:  e11.29) (Patient not taking: Reported on 12/30/2020)   escitalopram (LEXAPRO) 20 MG tablet TAKE 1 TABLET BY MOUTH DAILY FOR MOOD   fluticasone (FLONASE) 50 MCG/ACT nasal spray Place 2 sprays into both nostrils daily.   ibuprofen (ADVIL,MOTRIN) 800 MG tablet Take 1 tablet (800 mg total) by mouth every 8 (eight) hours as needed.   ipratropium (ATROVENT) 0.06 % nasal spray Place 2 sprays into the nose 3 (three) times daily.   Lancets (ONETOUCH ULTRASOFT) lancets Use 3 lancets daily to check blood sugar-DX-E11.65   levocetirizine (XYZAL) 5 MG tablet Take  1 tablet  Daily  for Allergies   lisdexamfetamine (VYVANSE) 20 MG capsule Take 1 tab daily in the morning as needed, avoid taking daily due to risk for tolerance. 30 tabs should last longer than a month.   losartan (COZAAR) 100 MG tablet TAKE 1 TABLET DAILY FOR BP & DIABETIC KIDNEY PROTECTION   metFORMIN (GLUCOPHAGE-XR) 750 MG 24 hr tablet Take 1 tablet 2 x /day with Meals for Diabetes   NOVOLIN 70/30 (70-30) 100 UNIT/ML injection INJECT 25 UNITS IN THE AM AND 20 UNITS AT DINNER   olopatadine (PATANOL) 0.1 % ophthalmic solution PLACE 1 DROP INTO BOTH EYES 2 (TWO) TIMES DAILY.   ONETOUCH VERIO test strip CHECK BLOOD SUGAR 3 TIMES DAILY-DX-E11.65    promethazine-dextromethorphan (PROMETHAZINE-DM) 6.25-15 MG/5ML syrup Take 5 mLs by mouth 4 (four) times daily as needed for cough.   rosuvastatin (CRESTOR) 20 MG tablet TAKE 1 TABLET BY MOUTH EVERY DAY FOR CHOLESTEROL   tiZANidine (ZANAFLEX) 2 MG tablet TAKE 1 TABLET (2 MG TOTAL) BY MOUTH EVERY 6 (SIX) HOURS AS NEEDED FOR MUSCLE SPASMS.   traMADol (ULTRAM) 50 MG tablet Take 1 tablet (50 mg total) by mouth every 6 (six) hours as needed. (Patient not taking: Reported on 12/30/2020)   No current facility-administered medications on file prior to visit.     Allergies:  Allergies  Allergen Reactions   Tape Rash   Ace Inhibitors Cough   Metformin And Related     myalgia   Morphine And Related Itching   Penicillins     Pt states that in 3rd grade she was given  an injection in the buttock and reacted with a large hive in that area Pt can take oral penicillin with no reaction     Wellbutrin [Bupropion]     Felt funny     Medical History:  Past Medical History:  Diagnosis Date   ADD (attention deficit disorder)    Allergy    Anxiety    Arthritis    Asthma    with severe allergies pt does use albuterol inhaler when needed   Cataract    Depression    Diabetes mellitus without complication ([place]) 0240   TYPE 2    GERD (gastroesophageal reflux disease)    hx of had Nissen Fundoplication 9735 has not had any issues since   History of hiatal hernia    Hyperlipidemia 2004   Hypertension    IBS (irritable bowel syndrome)    Lynch syndrome    Tubular adenoma of colon    Family history- Reviewed and unchanged Social history- Reviewed and unchanged   Review of Systems:  Review of Systems  Constitutional:  Positive for fever (low grade). Negative for chills.  HENT:  Positive for congestion. Negative for hearing loss, sinus pain, sore throat and tinnitus.   Eyes:  Negative for blurred vision and double vision.  Respiratory:  Positive for cough (productive green mucus). Negative for  hemoptysis, sputum production, shortness of breath and wheezing.   Cardiovascular:  Negative for chest pain, palpitations and leg swelling.  Gastrointestinal:  Negative for abdominal pain, constipation, diarrhea, heartburn, nausea and vomiting.  Genitourinary:  Negative for dysuria and urgency.  Musculoskeletal:  Negative for back pain, falls, joint pain, myalgias and neck pain.  Skin:  Negative for rash.  Neurological:  Positive for tingling (feet bilaterally). Negative for dizziness, tremors, weakness and headaches.  Endo/Heme/Allergies:  Does not bruise/bleed easily.  Psychiatric/Behavioral:  Positive for depression (controlled with meds). Negative for suicidal ideas. The patient is not nervous/anxious and does not have insomnia.      Physical Exam: LMP  (LMP Unknown)  Wt Readings from Last 3 Encounters:  12/30/20 179 lb 6.4 oz (81.4 kg)  06/18/20 167 lb (75.8 kg)  05/07/20 164 lb 8 oz (74.6 kg)   General Appearance: Well nourished, in no apparent distress. Eyes: PERRLA, EOMs, conjunctiva no swelling or erythema Sinuses: No Frontal/maxillary tenderness ENT/Mouth: Ext aud canals clear, TMs without erythema, bulging. No erythema, swelling, or exudate on post pharynx.  Tonsils not swollen or erythematous. Hearing normal.  Neck: Supple, thyroid normal.  Respiratory: Respiratory effort normal, BS equal bilaterally without rales, rhonchi, wheezing or stridor. Cough noted Cardio: RRR with no MRGs. Brisk peripheral pulses without edema.  Abdomen: Soft, + BS.  Non tender, no guarding, rebound, hernias, masses. Lymphatics: Non tender without lymphadenopathy.  Musculoskeletal: Full ROM, 5/5 strength, Normal gait Skin: Warm, dry without rashes, lesions, ecchymosis.  Neuro: Cranial nerves intact. No cerebellar symptoms.  Psych: Awake and oriented X 3, normal affect, Insight and Judgment appropriate.    Magda Bernheim, NP 10:39 AM [facility]

## 2021-04-03 ENCOUNTER — Other Ambulatory Visit: Payer: Self-pay | Admitting: Nurse Practitioner

## 2021-04-03 ENCOUNTER — Ambulatory Visit: Payer: 59 | Admitting: Nurse Practitioner

## 2021-04-03 ENCOUNTER — Telehealth: Payer: Self-pay

## 2021-04-03 DIAGNOSIS — F988 Other specified behavioral and emotional disorders with onset usually occurring in childhood and adolescence: Secondary | ICD-10-CM

## 2021-04-03 MED ORDER — LISDEXAMFETAMINE DIMESYLATE 20 MG PO CAPS
ORAL_CAPSULE | ORAL | 0 refills | Status: DC
Start: 2021-04-03 — End: 2021-07-18

## 2021-04-03 NOTE — Telephone Encounter (Signed)
Patient requesting refill on Vyvanse   [facility] [place]

## 2021-04-23 DIAGNOSIS — N182 Chronic kidney disease, stage 2 (mild): Secondary | ICD-10-CM | POA: Insufficient documentation

## 2021-04-23 DIAGNOSIS — E1122 Type 2 diabetes mellitus with diabetic chronic kidney disease: Secondary | ICD-10-CM | POA: Insufficient documentation

## 2021-04-23 NOTE — Progress Notes (Signed)
FOLLOW UP  Assessment and Plan:   Hypertension Well controlled with current medications  Monitor blood pressure at home; patient to call if consistently greater than 130/80 Continue DASH diet.   Reminder to go to the ER if any CP, SOB, nausea, dizziness, severe HA, changes vision/speech, left arm numbness and tingling and jaw pain.  Diabetes with CKD 2 (HCC) Continue metformin Increase trulicity to 1.5 mg/week with lifestyle changes Continue 70/30, now 20untis in PM and 18units PM for now but if persistent fasting/pre dinner check 150+ consequtively start increasing insulin by 2 units every 3 days until better controlled Check blood sugars BID Once blood sugars are less than 130 regularly will start to decrease insulin. Continue diet and exercise Check A1C CBC CMP  Hyperlipidemia associated with type 2 diabetes mellitus (HCC) Currently at goal LDL <70  Continue low cholesterol diet and exercise.  -     COMPLETE METABOLIC PANEL WITH GFR -     Lipid panel -     TSH  CKD II associated with T2DM (HCC) Increase fluids, avoid NSAIDS, monitor sugars, will monitor  Vitamin D Def At goal at last visit; continue supplementation to maintain goal of 60-100  Vit D level  Mild episode of recurrent major depressive disorder ([place])  need to add dx Continue Lexapro and monitor symptoms   Continue diet and exercise  Attention deficit disorder, unspecified hyperactivity presence Continue Vyvanse 20 mg qd , needs nearly daily, functions poorly without, discussed addictive nature and tolerance, encouraged to skip occasionally when able Monitor symptoms, PDMP  Insomnia Also with neuropathy, try gabapentin 100-300 mg HS PRN Follow up with progress Stress reduction and lifestyle reviewed  Diarrhea Monitor, hydration, BRAT diet, follow up for stool test if persistent, 4+ episodes/day   Continue diet and meds as discussed. Further disposition pending results of labs. Discussed med's  effects and SE's.   Over 30 minutes of exam, counseling, chart review, and critical decision making was performed.   Future Appointments  Date Time Provider [place]  06/18/2021  2:00 PM Magda Bernheim, NP [facility] None    ----------------------------------------------------------------------------------------------------------------------  HPI 66 y.o. female  presents for 3 month follow up on hypertension, cholesterol, diabetes, weight and ADHD.   Has 1 day of watery diarrhea, took imodium this AM and none further thus far. Denies fever/chills/blood/mucus/abd pain/N/V.   Lynch syndrome, needs hysterectomy per GYN (hasn't followed up recently), was advised needs to get A1C better controlled prior to this per patient.   BMI is Body mass index is 28.49 kg/m., she has not been working on diet and exercise, admits has been very stressed and eating has been stress relief.  Wt Readings from Last 3 Encounters:  04/24/21 168 lb 9.6 oz (76.5 kg)  12/30/20 179 lb 6.4 oz (81.4 kg)  06/18/20 167 lb (75.8 kg)    Her blood pressure has been controlled at home, today their BP is BP: 128/70  She does not workout. She denies chest pain, shortness of breath, dizziness.   She is on cholesterol medication Rosuvastatin 20 mg daily and denies myalgias. Her cholesterol is at goal of LDL <70. The cholesterol last visit was:   Lab Results  Component Value Date   CHOL 115 12/30/2020   HDL 46 (L) 12/30/2020   LDLCALC 45 12/30/2020   TRIG 162 (H) 12/30/2020   CHOLHDL 2.5 12/30/2020    She has not been working on diet and exercise for T2DM with hld and CKD, and reports  paresthesia  of the feet. Currently on Metformin XR 748m once daily in AM due to GI SX with second daose Novolin 70/30 20 in am 18 at night.  Didn't tolerate ozempic, GI SE even with low dose.  She did start on trulicity 07.09mg weekly but admits has missed  few weeks of this  Fasting glucose did initially improve, about 150, but  with stress/eating recently has been 200-rare 300+ Does check prior to dinner will be running 200 range Last A1C in the office was:  Lab Results  Component Value Date   HGBA1C 8.6 (H) 12/30/2020   She has CKD II range associated with T2DM monitored at this office, on losartan:  Lab Results  Component Value Date   EGFR 85 12/30/2020   Patient is on Vitamin D supplement.   Lab Results  Component Value Date   VD25OH 35 12/30/2020        Current Medications:  Current Outpatient Medications on File Prior to Visit  Medication Sig   acetaminophen (TYLENOL) 500 MG tablet Take 1,000 mg by mouth daily as needed for moderate pain or headache.   ALPRAZolam (XANAX) 0.5 MG tablet Take 1 tablet (0.5 mg total) by mouth 3 (three) times daily as needed.   aspirin 81 MG tablet Take 81 mg by mouth daily.   BD INSULIN SYRINGE U/F 31G X 5/16" 1 ML MISC USE AS DIRECTED TWICE A DAY   benzonatate (TESSALON PERLES) 100 MG capsule Take 2 capsules (200 mg total) by mouth 3 (three) times daily as needed for cough (Max: 6037mper day).   CHOLECALCIFEROL PO Take 5,000 Units by mouth daily.   Dulaglutide (TRULICITY) 0.6.28GZM/6.2HUOPN Inject 0.75  mg weekly  ( Dx:  e11.29)   escitalopram (LEXAPRO) 20 MG tablet TAKE 1 TABLET BY MOUTH DAILY FOR MOOD   fluticasone (FLONASE) 50 MCG/ACT nasal spray Place 2 sprays into both nostrils daily.   ibuprofen (ADVIL,MOTRIN) 800 MG tablet Take 1 tablet (800 mg total) by mouth every 8 (eight) hours as needed.   Lancets (ONETOUCH ULTRASOFT) lancets Use 3 lancets daily to check blood sugar-DX-E11.65   levocetirizine (XYZAL) 5 MG tablet Take  1 tablet  Daily  for Allergies   lisdexamfetamine (VYVANSE) 20 MG capsule Take 1 tab daily in the morning as needed, avoid taking daily due to risk for tolerance. 30 tabs should last longer than a month.   losartan (COZAAR) 100 MG tablet TAKE 1 TABLET DAILY FOR BP & DIABETIC KIDNEY PROTECTION   metFORMIN (GLUCOPHAGE-XR) 750 MG 24 hr tablet  Take 1 tablet 2 x /day with Meals for Diabetes   NOVOLIN 70/30 (70-30) 100 UNIT/ML injection INJECT 25 UNITS IN THE AM AND 20 UNITS AT DINNER   olopatadine (PATANOL) 0.1 % ophthalmic solution PLACE 1 DROP INTO BOTH EYES 2 (TWO) TIMES DAILY.   ONETOUCH VERIO test strip CHECK BLOOD SUGAR 3 TIMES DAILY-DX-E11.65   rosuvastatin (CRESTOR) 20 MG tablet TAKE 1 TABLET BY MOUTH EVERY DAY FOR CHOLESTEROL   tiZANidine (ZANAFLEX) 2 MG tablet TAKE 1 TABLET (2 MG TOTAL) BY MOUTH EVERY 6 (SIX) HOURS AS NEEDED FOR MUSCLE SPASMS.   azithromycin (ZITHROMAX) 250 MG tablet Take 2 tablets (500 mg) on  Day 1,  followed by 1 tablet (250 mg) once daily on Days 2 through 5. (Patient not taking: Reported on 04/24/2021)   ipratropium (ATROVENT) 0.06 % nasal spray Place 2 sprays into the nose 3 (three) times daily.   promethazine-dextromethorphan (PROMETHAZINE-DM) 6.25-15 MG/5ML syrup Take 5 mLs  by mouth 4 (four) times daily as needed for cough. (Patient not taking: Reported on 04/24/2021)   traMADol (ULTRAM) 50 MG tablet Take 1 tablet (50 mg total) by mouth every 6 (six) hours as needed. (Patient not taking: Reported on 04/24/2021)   No current facility-administered medications on file prior to visit.     Allergies:  Allergies  Allergen Reactions   Tape Rash   Ace Inhibitors Cough   Metformin And Related     myalgia   Morphine And Related Itching   Penicillins     Pt states that in 3rd grade she was given an injection in the buttock and reacted with a large hive in that area Pt can take oral penicillin with no reaction     Wellbutrin [Bupropion]     Felt funny     Medical History:  Past Medical History:  Diagnosis Date   ADD (attention deficit disorder)    Allergy    Anxiety    Arthritis    Asthma    with severe allergies pt does use albuterol inhaler when needed   Cataract    Depression    Diabetes mellitus without complication ([place]) 4536   TYPE 2    GERD (gastroesophageal reflux disease)    hx of  had Nissen Fundoplication 4680 has not had any issues since   History of hiatal hernia    Hyperlipidemia 2004   Hypertension    IBS (irritable bowel syndrome)    Lynch syndrome    Tubular adenoma of colon    Family history- Reviewed and unchanged Social history- Reviewed and unchanged   Review of Systems:  Review of Systems  Constitutional:  Negative for chills and fever.  HENT:  Positive for tinnitus (chronic, unchanged). Negative for congestion, hearing loss, sinus pain and sore throat.   Eyes:  Negative for blurred vision and double vision.  Respiratory:  Positive for cough (productive green mucus). Negative for hemoptysis, sputum production, shortness of breath and wheezing.   Cardiovascular:  Negative for chest pain, palpitations and leg swelling.  Gastrointestinal:  Positive for diarrhea (liquid stool x 1 episode this AM). Negative for abdominal pain, blood in stool, constipation, heartburn, melena, nausea and vomiting.  Genitourinary:  Negative for dysuria and urgency.  Musculoskeletal:  Negative for back pain, falls, joint pain, myalgias and neck pain.  Skin:  Negative for rash.  Neurological:  Positive for tingling (feet bilaterally). Negative for dizziness, tremors, weakness and headaches.  Endo/Heme/Allergies:  Does not bruise/bleed easily.  Psychiatric/Behavioral:  Negative for depression (controlled with meds) and suicidal ideas. The patient is not nervous/anxious and does not have insomnia.      Physical Exam: BP 128/70    Pulse (!) 110    Temp 97.6 F (36.4 C)    Resp 16    Ht 5' 4.5" (1.638 m)    Wt 168 lb 9.6 oz (76.5 kg)    LMP  (LMP Unknown)    SpO2 96%    BMI 28.49 kg/m  Wt Readings from Last 3 Encounters:  04/24/21 168 lb 9.6 oz (76.5 kg)  12/30/20 179 lb 6.4 oz (81.4 kg)  06/18/20 167 lb (75.8 kg)   General Appearance: Well nourished, in no apparent distress. Eyes: PERRLA, EOMs, conjunctiva no swelling or erythema Sinuses: No Frontal/maxillary  tenderness ENT/Mouth: Ext aud canals clear, TMs without erythema, bulging. No erythema, swelling, or exudate on post pharynx.  Tonsils not swollen or erythematous. Hearing normal.  Neck: Supple, thyroid normal.  Respiratory: Respiratory  effort normal, BS equal bilaterally without rales, rhonchi, wheezing or stridor. Cough noted Cardio: RRR with no MRGs. Brisk peripheral pulses without edema.  Abdomen: Soft, + BS.  Non tender, no guarding, rebound, hernias, masses. Lymphatics: Non tender without lymphadenopathy.  Musculoskeletal: Full ROM, 5/5 strength, Normal gait Skin: Warm, dry without rashes, lesions, ecchymosis.  Neuro: Cranial nerves intact. No cerebellar symptoms.  Psych: Awake and oriented X 3, normal affect, Insight and Judgment appropriate.    Izora Ribas, NP 11:13 AM [facility]

## 2021-04-24 ENCOUNTER — Other Ambulatory Visit: Payer: Self-pay

## 2021-04-24 ENCOUNTER — Ambulatory Visit (INDEPENDENT_AMBULATORY_CARE_PROVIDER_SITE_OTHER): Payer: Medicare HMO | Admitting: Adult Health

## 2021-04-24 ENCOUNTER — Encounter: Payer: Self-pay | Admitting: Adult Health

## 2021-04-24 VITALS — BP 128/70 | HR 110 | Temp 97.6°F | Resp 16 | Ht 64.5 in | Wt 168.6 lb

## 2021-04-24 DIAGNOSIS — E1165 Type 2 diabetes mellitus with hyperglycemia: Secondary | ICD-10-CM | POA: Diagnosis not present

## 2021-04-24 DIAGNOSIS — F988 Other specified behavioral and emotional disorders with onset usually occurring in childhood and adolescence: Secondary | ICD-10-CM

## 2021-04-24 DIAGNOSIS — Z79899 Other long term (current) drug therapy: Secondary | ICD-10-CM

## 2021-04-24 DIAGNOSIS — E1122 Type 2 diabetes mellitus with diabetic chronic kidney disease: Secondary | ICD-10-CM | POA: Diagnosis not present

## 2021-04-24 DIAGNOSIS — E1140 Type 2 diabetes mellitus with diabetic neuropathy, unspecified: Secondary | ICD-10-CM

## 2021-04-24 DIAGNOSIS — E559 Vitamin D deficiency, unspecified: Secondary | ICD-10-CM

## 2021-04-24 DIAGNOSIS — I10 Essential (primary) hypertension: Secondary | ICD-10-CM

## 2021-04-24 DIAGNOSIS — E785 Hyperlipidemia, unspecified: Secondary | ICD-10-CM

## 2021-04-24 DIAGNOSIS — N182 Chronic kidney disease, stage 2 (mild): Secondary | ICD-10-CM

## 2021-04-24 DIAGNOSIS — E1169 Type 2 diabetes mellitus with other specified complication: Secondary | ICD-10-CM | POA: Diagnosis not present

## 2021-04-24 DIAGNOSIS — Z794 Long term (current) use of insulin: Secondary | ICD-10-CM

## 2021-04-24 DIAGNOSIS — F330 Major depressive disorder, recurrent, mild: Secondary | ICD-10-CM

## 2021-04-24 DIAGNOSIS — E663 Overweight: Secondary | ICD-10-CM

## 2021-04-24 MED ORDER — GABAPENTIN 100 MG PO CAPS: 100.0000 mg | ORAL_CAPSULE | Freq: Every day | ORAL | 0 refills | Status: DC

## 2021-04-24 MED ORDER — METFORMIN HCL ER 750 MG PO TB24: ORAL_TABLET | ORAL | 3 refills | Status: DC

## 2021-04-24 MED ORDER — TRULICITY 1.5 MG/0.5ML ~~LOC~~ SOAJ: 1.5000 mg | SUBCUTANEOUS | 1 refills | Status: DC

## 2021-04-25 LAB — COMPLETE METABOLIC PANEL WITH GFR
AG Ratio: 1.6 (calc) (ref 1.0–2.5)
ALT: 14 U/L (ref 6–29)
AST: 13 U/L (ref 10–35)
Albumin: 4.2 g/dL (ref 3.6–5.1)
Alkaline phosphatase (APISO): 93 U/L (ref 37–153)
BUN: 15 mg/dL (ref 7–25)
CO2: 26 mmol/L (ref 20–32)
Calcium: 9.9 mg/dL (ref 8.6–10.4)
Chloride: 103 mmol/L (ref 98–110)
Creat: 0.82 mg/dL (ref 0.50–1.05)
Globulin: 2.7 g/dL (calc) (ref 1.9–3.7)
Glucose, Bld: 368 mg/dL — ABNORMAL HIGH (ref 65–99)
Potassium: 4.2 mmol/L (ref 3.5–5.3)
Sodium: 136 mmol/L (ref 135–146)
Total Bilirubin: 0.8 mg/dL (ref 0.2–1.2)
Total Protein: 6.9 g/dL (ref 6.1–8.1)
eGFR: 79 mL/min/{1.73_m2} (ref >=60)

## 2021-04-25 LAB — CBC WITH DIFFERENTIAL/PLATELET
Absolute Monocytes: 426 cells/uL (ref 200–950)
Basophils Absolute: 90 cells/uL (ref 0–200)
Basophils Relative: 1.1 %
Eosinophils Absolute: 221 cells/uL (ref 15–500)
Eosinophils Relative: 2.7 %
HCT: 45.8 % — ABNORMAL HIGH (ref 35.0–45.0)
Hemoglobin: 14.8 g/dL (ref 11.7–15.5)
Lymphs Abs: 2255 cells/uL (ref 850–3900)
MCH: 27.5 pg (ref 27.0–33.0)
MCHC: 32.3 g/dL (ref 32.0–36.0)
MCV: 85.1 fL (ref 80.0–100.0)
MPV: 11.6 fL (ref 7.5–12.5)
Monocytes Relative: 5.2 %
Neutro Abs: 5207 cells/uL (ref 1500–7800)
Neutrophils Relative %: 63.5 %
Platelets: 339 10*3/uL (ref 140–400)
RBC: 5.38 10*6/uL — ABNORMAL HIGH (ref 3.80–5.10)
RDW: 12.5 % (ref 11.0–15.0)
Total Lymphocyte: 27.5 %
WBC: 8.2 10*3/uL (ref 3.8–10.8)

## 2021-04-25 LAB — LIPID PANEL
Cholesterol: 119 mg/dL (ref <200)
HDL: 49 mg/dL — ABNORMAL LOW (ref >=50)
LDL Cholesterol (Calc): 44 mg/dL (calc)
Non-HDL Cholesterol (Calc): 70 mg/dL (calc) (ref <130)
Total CHOL/HDL Ratio: 2.4 (calc) (ref <5.0)
Triglycerides: 192 mg/dL — ABNORMAL HIGH (ref <150)

## 2021-04-25 LAB — HEMOGLOBIN A1C
Hgb A1c MFr Bld: 11.4 % of total Hgb — ABNORMAL HIGH (ref <5.7)
Mean Plasma Glucose: 280 mg/dL
eAG (mmol/L): 15.5 mmol/L

## 2021-04-25 LAB — TSH: TSH: 3.84 mIU/L (ref 0.40–4.50)

## 2021-04-25 LAB — MAGNESIUM: Magnesium: 1.7 mg/dL (ref 1.5–2.5)

## 2021-05-15 LAB — HM MAMMOGRAPHY

## 2021-05-19 NOTE — Progress Notes (Signed)
Left 2nd message to have pt schedule appt per Caryl Pina

## 2021-05-21 ENCOUNTER — Encounter: Payer: Self-pay | Admitting: Internal Medicine

## 2021-06-09 ENCOUNTER — Encounter: Payer: Self-pay | Admitting: Internal Medicine

## 2021-06-12 LAB — HM DIABETES EYE EXAM

## 2021-06-18 ENCOUNTER — Encounter: Payer: 59 | Admitting: Nurse Practitioner

## 2021-06-20 ENCOUNTER — Other Ambulatory Visit: Payer: Self-pay | Admitting: Internal Medicine

## 2021-07-04 ENCOUNTER — Ambulatory Visit (AMBULATORY_SURGERY_CENTER): Payer: Medicare HMO | Admitting: *Deleted

## 2021-07-04 VITALS — Ht 64.0 in | Wt 170.0 lb

## 2021-07-04 DIAGNOSIS — Z1509 Genetic susceptibility to other malignant neoplasm: Secondary | ICD-10-CM

## 2021-07-04 DIAGNOSIS — Z86010 Personal history of colonic polyps: Secondary | ICD-10-CM

## 2021-07-04 DIAGNOSIS — Z800 Family history of malignant neoplasm of digestive organs: Secondary | ICD-10-CM

## 2021-07-04 MED ORDER — NA SULFATE-K SULFATE-MG SULF 17.5-3.13-1.6 GM/177ML PO SOLN: 1.0000 | Freq: Once | ORAL | 0 refills | Status: AC

## 2021-07-04 NOTE — Progress Notes (Signed)
No egg or soy allergy known to patient  ?No issues known to pt with past sedation with any surgeries or procedures ?Patient denies ever being told they had issues or difficulty with intubation  ?No FH of Malignant Hyperthermia ?Pt is not on diet pills ?Pt is not on  home 02  ?Pt is not on blood thinners  ?Pt denies issues with constipation daily-  ?No A fib or A flutter ? NO PA's for preps discussed with pt In PV today  ?Discussed with pt there will be an out-of-pocket cost for prep and that varies from $0 to 70 +  dollars - pt verbalized understanding  ? ?Due to the COVID-19 pandemic we are asking patients to follow certain guidelines in PV and the [place]   ?Pt aware of COVID protocols and [facility] guidelines  ? ?PV completed over the phone. Pt verified name, DOB, address and insurance during PV today.  ?Pt mailed instruction packet with copy of consent form to read and not return, and instructions.  ?Pt encouraged to call with questions or issues.  ?If pt has My chart, procedure instructions sent via My Chart  ? ?

## 2021-07-08 ENCOUNTER — Other Ambulatory Visit: Payer: Self-pay | Admitting: Nurse Practitioner

## 2021-07-08 ENCOUNTER — Encounter: Payer: Self-pay | Admitting: Internal Medicine

## 2021-07-18 ENCOUNTER — Telehealth: Payer: Self-pay | Admitting: Adult Health

## 2021-07-18 ENCOUNTER — Other Ambulatory Visit: Payer: Self-pay | Admitting: Adult Health

## 2021-07-18 DIAGNOSIS — F988 Other specified behavioral and emotional disorders with onset usually occurring in childhood and adolescence: Secondary | ICD-10-CM

## 2021-07-18 MED ORDER — LISDEXAMFETAMINE DIMESYLATE 20 MG PO CAPS: ORAL_CAPSULE | ORAL | 0 refills | Status: DC

## 2021-07-18 NOTE — Progress Notes (Signed)
Future Appointments  ?Date Time Provider [place]  ?07/25/2021  8:30 AM Pyrtle, Lajuan Lines, MD [facility]  ?07/31/2021 10:00 AM Darrol Jump, NP [facility] None  ?10/30/2021 10:00 AM Mull, Townsend Roger, NP [facility] None  ? ? ?PDMP reviewed for vyvanse refill request.  ? ?

## 2021-07-18 NOTE — Telephone Encounter (Signed)
Pt is requesting refill on Vyvanse to go to the [facility] on file.  ?

## 2021-07-20 ENCOUNTER — Other Ambulatory Visit: Payer: Self-pay | Admitting: Internal Medicine

## 2021-07-25 ENCOUNTER — Ambulatory Visit (AMBULATORY_SURGERY_CENTER): Payer: Medicare HMO | Admitting: Internal Medicine

## 2021-07-25 ENCOUNTER — Encounter: Payer: Self-pay | Admitting: Internal Medicine

## 2021-07-25 VITALS — BP 127/72 | HR 62 | Temp 98.4°F | Resp 22 | Ht 64.5 in | Wt 170.0 lb

## 2021-07-25 DIAGNOSIS — K2970 Gastritis, unspecified, without bleeding: Secondary | ICD-10-CM | POA: Diagnosis not present

## 2021-07-25 DIAGNOSIS — Z1509 Genetic susceptibility to other malignant neoplasm: Secondary | ICD-10-CM

## 2021-07-25 DIAGNOSIS — D123 Benign neoplasm of transverse colon: Secondary | ICD-10-CM

## 2021-07-25 DIAGNOSIS — Z860100 Personal history of colon polyps, unspecified: Secondary | ICD-10-CM

## 2021-07-25 DIAGNOSIS — K317 Polyp of stomach and duodenum: Secondary | ICD-10-CM

## 2021-07-25 DIAGNOSIS — D120 Benign neoplasm of cecum: Secondary | ICD-10-CM | POA: Diagnosis not present

## 2021-07-25 DIAGNOSIS — K2950 Unspecified chronic gastritis without bleeding: Secondary | ICD-10-CM

## 2021-07-25 MED ORDER — SODIUM CHLORIDE 0.9 % IV SOLN: 500.0000 mL | Freq: Once | INTRAVENOUS | Status: DC

## 2021-07-25 NOTE — Op Note (Signed)
[place] ?Patient Name: Sydney Dickson ?Procedure Date: 07/25/2021 8:22 AM ?MRN: [id number] ?Endoscopist: Jerene Bears , MD ?Age: 66 ?Referring MD:  ?Date of Birth: 03/28/1956 ?Gender: Female ?Account #: [id number] ?Procedure:                Colonoscopy ?Indications:              Last colonoscopy: October 2020, Lynch Syndrome with  ?                          personal hx of adenomas and TSAs ?Medicines:                Monitored Anesthesia Care ?Procedure:                Pre-Anesthesia Assessment: ?                          - Prior to the procedure, a History and Physical  ?                          was performed, and patient medications and  ?                          allergies were reviewed. The patient's tolerance of  ?                          previous anesthesia was also reviewed. The risks  ?                          and benefits of the procedure and the sedation  ?                          options and risks were discussed with the patient.  ?                          All questions were answered, and informed consent  ?                          was obtained. Prior Anticoagulants: The patient has  ?                          taken no previous anticoagulant or antiplatelet  ?                          agents. ASA Grade Assessment: II - A patient with  ?                          mild systemic disease. After reviewing the risks  ?                          and benefits, the patient was deemed in  ?                          satisfactory condition to undergo the procedure. ?  After obtaining informed consent, the colonoscope  ?                          was passed under direct vision. Throughout the  ?                          procedure, the patient's blood pressure, pulse, and  ?                          oxygen saturations were monitored continuously. The  ?                          CF [id number] #[id number] was introduced through the anus  ?                          and advanced to the cecum,  identified by  ?                          appendiceal orifice and ileocecal valve. The  ?                          colonoscopy was performed without difficulty. The  ?                          patient tolerated the procedure well. The quality  ?                          of the bowel preparation was good. The ileocecal  ?                          valve, appendiceal orifice, and rectum were  ?                          photographed. ?Scope In: 8:38:26 AM ?Scope Out: 9:03:52 AM ?Scope Withdrawal Time: 0 hours 17 minutes 42 seconds  ?Total Procedure Duration: 0 hours 25 minutes 26 seconds  ?Findings:                 The digital rectal exam was normal. ?                          A 4 mm polyp was found in the cecum. The polyp was  ?                          sessile. The polyp was removed with a cold snare.  ?                          Resection and retrieval were complete. ?                          A 7 mm polyp was found in the transverse colon. The  ?                          polyp was sessile. The polyp was  removed with a  ?                          cold snare. Resection and retrieval were complete. ?                          The exam was otherwise without abnormality on  ?                          direct and retroflexion views. ?Complications:            No immediate complications. ?Estimated Blood Loss:     Estimated blood loss was minimal. ?Impression:               - One 4 mm polyp in the cecum, removed with a cold  ?                          snare. Resected and retrieved. ?                          - One 7 mm polyp in the transverse colon, removed  ?                          with a cold snare. Resected and retrieved. ?                          - The examination was otherwise normal on direct  ?                          and retroflexion views. ?Recommendation:           - Patient has a contact number available for  ?                          emergencies. The signs and symptoms of potential  ?                           delayed complications were discussed with the  ?                          patient. Return to normal activities tomorrow.  ?                          Written discharge instructions were provided to the  ?                          patient. ?                          - Resume previous diet. ?                          - Continue present medications. ?                          - Await pathology results. ?                          -  Repeat colonoscopy is recommended for  ?                          surveillance. The colonoscopy date will be  ?                          determined after pathology results from today's  ?                          exam become available for review. ?Jerene Bears, MD ?07/25/2021 9:15:44 AM ?This report has been signed electronically. ?

## 2021-07-25 NOTE — Progress Notes (Signed)
Called to room to assist during endoscopic procedure.  Patient ID and intended procedure confirmed with present staff. Received instructions for my participation in the procedure from the performing physician.  

## 2021-07-25 NOTE — Progress Notes (Signed)
No problems noted in the recovery room. maw 

## 2021-07-25 NOTE — Progress Notes (Signed)
? ?GASTROENTEROLOGY PROCEDURE H&P NOTE  ? ?Primary Care Physician: ?Unk Pinto, MD ? ? ? ?Reason for Procedure:  Lynch syndrome ? ?Plan:    EGD and colonoscopy ? ?Patient is appropriate for endoscopic procedure(s) in the ambulatory ([place]) setting. ? ?The nature of the procedure, as well as the risks, benefits, and alternatives were carefully and thoroughly reviewed with the patient. Ample time for discussion and questions allowed. The patient understood, was satisfied, and agreed to proceed.  ? ? ? ?HPI: ?Sydney Dickson is a 66 y.o. female who presents for EGD and colonoscopy.  Medical history as below.  Tolerated the prep.  No recent chest pain or shortness of breath.  No abdominal pain today. ? ?Past Medical History:  ?Diagnosis Date  ? ADD (attention deficit disorder)   ? Allergy   ? Anxiety   ? Arthritis   ? Asthma   ? with severe allergies pt does use albuterol inhaler when needed  ? Cataract   ? removed both eyes  ? Depression   ? Diabetes mellitus without complication ([place]) 8502  ? TYPE 2   ? GERD (gastroesophageal reflux disease)   ? hx of had Nissen Fundoplication 7741 has not had any issues since  ? History of hiatal hernia   ? Hyperlipidemia 2004  ? Hypertension   ? IBS (irritable bowel syndrome)   ? Lynch syndrome   ? Neuromuscular disorder ([place])   ? Fibromyalgia  ? Tubular adenoma of colon   ? ? ?Past Surgical History:  ?Procedure Laterality Date  ? [place], 2001  ? CHOLECYSTECTOMY    ? colonscopy     ? COSMETIC SURGERY    ? rhinoplasty  ? EYE SURGERY  04/06/2004  ? lasik  ? HERNIA REPAIR    ? NASAL SINUS SURGERY    ? NISSEN FUNDOPLICATION  28/78/6767  ? PARTIAL PROCTECTOMY BY TEM N/A 07/19/2014  ? Procedure: PARTIAL PROCTECTOMY BY TEM;  Surgeon: Michael Boston, MD;  Location: [facility];  Service: General;  Laterality: N/A;  ? POLYPECTOMY    ? ROTATOR CUFF REPAIR Right 04/06/2010  ? TUBAL LIGATION    ? UPPER GI ENDOSCOPY    ? ? ?Prior to Admission medications   ?Medication Sig  Start Date End Date Taking? Authorizing Provider  ?BD INSULIN SYRINGE U/F 31G X 5/16" 1 ML MISC USE AS DIRECTED TWICE A DAY 01/27/21  Yes Unk Pinto, MD  ?Dulaglutide (TRULICITY) 1.5 MC/9.4BS SOPN Inject 1.5 mg into the skin once a week. 04/24/21  Yes Liane Comber, NP  ?escitalopram (LEXAPRO) 20 MG tablet TAKE 1 TABLET BY MOUTH DAILY FOR MOOD 03/08/21  Yes Magda Bernheim, NP  ?Lancets (ONETOUCH ULTRASOFT) lancets Use 3 lancets daily to check blood sugar-DX-E11.65 04/29/20  Yes Unk Pinto, MD  ?levocetirizine (XYZAL) 5 MG tablet Take  1 tablet  Daily for Allergies                                       /                                 TAKE                          BY  MOUTH 07/20/21  Yes Unk Pinto, MD  ?lisdexamfetamine (VYVANSE) 20 MG capsule Take 1 tab daily in the morning as needed, avoid taking daily due to risk for tolerance. 30 tabs should last longer than a month. 07/18/21  Yes Liane Comber, NP  ?losartan (COZAAR) 100 MG tablet TAKE 1 TABLET DAILY FOR BP & DIABETIC KIDNEY PROTECTION 09/06/20  Yes Liane Comber, NP  ?NOVOLIN 70/30 (70-30) 100 UNIT/ML injection INJECT 25 UNITS IN THE AM AND 20 UNITS AT [facility] 09/24/20  Yes Unk Pinto, MD  ?[facility] VERIO test strip CHECK BLOOD SUGAR 3 TIMES DAILY-DX-E11.65 07/09/21  Yes Liane Comber, NP  ?rosuvastatin (CRESTOR) 20 MG tablet TAKE 1 TABLET BY MOUTH EVERY DAY FOR CHOLESTEROL 08/19/20  Yes Liane Comber, NP  ?acetaminophen (TYLENOL) 500 MG tablet Take 1,000 mg by mouth daily as needed for moderate pain or headache.    [provider]  ?ALPRAZolam Duanne Moron) 0.5 MG tablet Take 1 tablet (0.5 mg total) by mouth 3 (three) times daily as needed. 03/13/21   Magda Bernheim, NP  ?aspirin 81 MG tablet Take 81 mg by mouth daily.    [provider]  ?CHOLECALCIFEROL PO Take 5,000 Units by mouth daily.    [provider]  ?fluticasone (FLONASE) 50 MCG/ACT nasal spray Place 2 sprays into both nostrils daily. 03/21/18    Liane Comber, NP  ?gabapentin (NEURONTIN) 100 MG capsule Take 1-3 capsules (100-300 mg total) by mouth at bedtime. ?Patient not taking: Reported on 07/04/2021 04/24/21 04/24/22  Liane Comber, NP  ?ibuprofen (ADVIL,MOTRIN) 800 MG tablet Take 1 tablet (800 mg total) by mouth every 8 (eight) hours as needed. 05/14/14   Kelby Aline, PA-C  ?ipratropium (ATROVENT) 0.06 % nasal spray Place 2 sprays into the nose 3 (three) times daily. 06/19/19 07/04/21  Vladimir Crofts, PA-C  ?olopatadine (PATANOL) 0.1 % ophthalmic solution PLACE 1 DROP INTO BOTH EYES 2 (TWO) TIMES DAILY. 07/28/17   Unk Pinto, MD  ?tiZANidine (ZANAFLEX) 2 MG tablet TAKE 1 TABLET (2 MG TOTAL) BY MOUTH EVERY 6 (SIX) HOURS AS NEEDED FOR MUSCLE SPASMS. 07/08/18   Liane Comber, NP  ? ? ?Current Outpatient Medications  ?Medication Sig Dispense Refill  ? BD INSULIN SYRINGE U/F 31G X 5/16" 1 ML MISC USE AS DIRECTED TWICE A DAY 200 each 3  ? Dulaglutide (TRULICITY) 1.5 BH/4.1PF SOPN Inject 1.5 mg into the skin once a week. 6 mL 1  ? escitalopram (LEXAPRO) 20 MG tablet TAKE 1 TABLET BY MOUTH DAILY FOR MOOD 90 tablet 1  ? Lancets (ONETOUCH ULTRASOFT) lancets Use 3 lancets daily to check blood sugar-DX-E11.65 300 each 1  ? levocetirizine (XYZAL) 5 MG tablet Take  1 tablet  Daily for Allergies                                       /                                 TAKE                          BY                  MOUTH 90 tablet 3  ? lisdexamfetamine (VYVANSE) 20 MG capsule Take 1 tab daily in the morning as needed, avoid  taking daily due to risk for tolerance. 30 tabs should last longer than a month. 90 capsule 0  ? losartan (COZAAR) 100 MG tablet TAKE 1 TABLET DAILY FOR BP & DIABETIC KIDNEY PROTECTION 90 tablet 3  ? NOVOLIN 70/30 (70-30) 100 UNIT/ML injection INJECT 25 UNITS IN THE AM AND 20 UNITS AT DINNER 40 mL 2  ? ONETOUCH VERIO test strip CHECK BLOOD SUGAR 3 TIMES DAILY-DX-E11.65 300 strip 1  ? rosuvastatin (CRESTOR) 20 MG tablet TAKE 1 TABLET BY  MOUTH EVERY DAY FOR CHOLESTEROL 90 tablet 3  ? acetaminophen (TYLENOL) 500 MG tablet Take 1,000 mg by mouth daily as needed for moderate pain or headache.    ? ALPRAZolam (XANAX) 0.5 MG tablet Take 1 tablet (0.5 mg total) by mouth 3 (three) times daily as needed. 90 tablet 0  ? aspirin 81 MG tablet Take 81 mg by mouth daily.    ? CHOLECALCIFEROL PO Take 5,000 Units by mouth daily.    ? fluticasone (FLONASE) 50 MCG/ACT nasal spray Place 2 sprays into both nostrils daily. 48 g 1  ? gabapentin (NEURONTIN) 100 MG capsule Take 1-3 capsules (100-300 mg total) by mouth at bedtime. (Patient not taking: Reported on 07/04/2021) 90 capsule 0  ? ibuprofen (ADVIL,MOTRIN) 800 MG tablet Take 1 tablet (800 mg total) by mouth every 8 (eight) hours as needed. 90 tablet 1  ? ipratropium (ATROVENT) 0.06 % nasal spray Place 2 sprays into the nose 3 (three) times daily. 15 mL 2  ? olopatadine (PATANOL) 0.1 % ophthalmic solution PLACE 1 DROP INTO BOTH EYES 2 (TWO) TIMES DAILY. 5 mL 5  ? tiZANidine (ZANAFLEX) 2 MG tablet TAKE 1 TABLET (2 MG TOTAL) BY MOUTH EVERY 6 (SIX) HOURS AS NEEDED FOR MUSCLE SPASMS. 60 tablet 0  ? ?Current Facility-Administered Medications  ?Medication Dose Route Frequency Provider Last Rate Last Admin  ? 0.9 %  sodium chloride infusion  500 mL Intravenous Once Alexxander Kurt, Lajuan Lines, MD      ? ? ?Allergies as of 07/25/2021 - Review Complete 07/25/2021  ?Allergen Reaction Noted  ? Tape Rash 07/12/2014  ? Ace inhibitors Cough 02/18/2015  ? Metformin and related  08/03/2013  ? Morphine and related Itching 04/27/2013  ? Penicillins  01/24/2013  ? Wellbutrin [bupropion]  04/27/2013  ? ? ?Family History  ?Problem Relation Age of Onset  ? Cancer Mother   ?     breast  ? Hyperlipidemia Mother   ? Hypertension Mother   ? Diabetes Mother   ? Breast cancer Mother   ? Colon polyps Father   ? Hyperlipidemia Father   ? Hypertension Father   ? Colon cancer Father 33  ? Skin cancer Father   ? Prostate cancer Father   ? Breast cancer Sister    ? Colon polyps Brother   ? Colon cancer Brother 68  ?     and again at 96  ? Cancer Paternal Grandmother   ?     cervical and ? uterine  ? Colon polyps Paternal Grandfather   ? Colon cancer Paternal Rodney Langton

## 2021-07-25 NOTE — Patient Instructions (Addendum)
Handout was given to your care partner on polyps. ?Your sugar was 123 in the recovery room. ?You may resume your current medications today. ?Await biopsy results.  May take 1-3 weeks to receive pathology results. ?Repeat Upper Endoscopy in 3 year (07/2024) for screening purposes due to Lynch Syndrome. ?Please call if any questions or concerns. ?  ? ? ? ?YOU HAD AN ENDOSCOPIC PROCEDURE TODAY AT [facility]:   Refer to the procedure report that was given to you for any specific questions about what was found during the examination.  If the procedure report does not answer your questions, please call your gastroenterologist to clarify.  If you requested that your care partner not be given the details of your procedure findings, then the procedure report has been included in a sealed envelope for you to review at your convenience later. ? ?YOU SHOULD EXPECT: Some feelings of bloating in the abdomen. Passage of more gas than usual.  Walking can help get rid of the air that was put into your GI tract during the procedure and reduce the bloating. If you had a lower endoscopy (such as a colonoscopy or flexible sigmoidoscopy) you may notice spotting of blood in your stool or on the toilet paper. If you underwent a bowel prep for your procedure, you may not have a normal bowel movement for a few days. ? ?Please Note:  You might notice some irritation and congestion in your nose or some drainage.  This is from the oxygen used during your procedure.  There is no need for concern and it should clear up in a day or so. ? ?SYMPTOMS TO REPORT IMMEDIATELY: ? ?Following lower endoscopy (colonoscopy or flexible sigmoidoscopy): ? Excessive amounts of blood in the stool ? Significant tenderness or worsening of abdominal pains ? Swelling of the abdomen that is new, acute ? Fever of 100?F or higher ? ?Following upper endoscopy (EGD) ? Vomiting of blood or coffee ground material ? New chest pain or pain under the shoulder  blades ? Painful or persistently difficult swallowing ? New shortness of breath ? Fever of 100?F or higher ? Black, tarry-looking stools ? ?For urgent or emergent issues, a gastroenterologist can be reached at any hour by calling [phone]. ?Do not use MyChart messaging for urgent concerns.  ? ? ?DIET:  We do recommend a small meal at first, but then you may proceed to your regular diet.  Drink plenty of fluids but you should avoid alcoholic beverages for 24 hours. ? ?ACTIVITY:  You should plan to take it easy for the rest of today and you should NOT DRIVE or use heavy machinery until tomorrow (because of the sedation medicines used during the test).   ? ?FOLLOW UP: ?Our staff will call the number listed on your records 48-72 hours following your procedure to check on you and address any questions or concerns that you may have regarding the information given to you following your procedure. If we do not reach you, we will leave a message.  We will attempt to reach you two times.  During this call, we will ask if you have developed any symptoms of COVID 19. If you develop any symptoms (ie: fever, flu-like symptoms, shortness of breath, cough etc.) before then, please call [phone].  If you test positive for Covid 19 in the 2 weeks post procedure, please call and report this information to [place].   ? ?If any biopsies were taken you will be contacted by phone  or by letter within the next 1-3 weeks.  Please call us at [phone] if you have not heard about the biopsies in 3 weeks.  ? ? ?SIGNATURES/CONFIDENTIALITY: ?You and/or your care partner have signed paperwork which will be entered into your electronic medical record.  These signatures attest to the fact that that the information above on your After Visit Summary has been reviewed and is understood.  Full responsibility of the confidentiality of this discharge information lies with you and/or your care-partner.  ?

## 2021-07-25 NOTE — Progress Notes (Signed)
Report to PACU RN; VSS. ?

## 2021-07-25 NOTE — Progress Notes (Signed)
VS-CW  Pt's states no medical or surgical changes since previsit or office visit.  

## 2021-07-25 NOTE — Op Note (Signed)
[place] ?Patient Name: Sydney Dickson ?Procedure Date: 07/25/2021 8:22 AM ?MRN: [id number] ?Endoscopist: Jerene Bears , MD ?Age: 66 ?Referring MD:  ?Date of Birth: 02-Oct-1955 ?Gender: Female ?Account #: [id number] ?Procedure:                Upper GI endoscopy ?Indications:              Lynch Syndrome ?Medicines:                Monitored Anesthesia Care ?Procedure:                Pre-Anesthesia Assessment: ?                          - Prior to the procedure, a History and Physical  ?                          was performed, and patient medications and  ?                          allergies were reviewed. The patient's tolerance of  ?                          previous anesthesia was also reviewed. The risks  ?                          and benefits of the procedure and the sedation  ?                          options and risks were discussed with the patient.  ?                          All questions were answered, and informed consent  ?                          was obtained. Prior Anticoagulants: The patient has  ?                          taken no previous anticoagulant or antiplatelet  ?                          agents. ASA Grade Assessment: II - A patient with  ?                          mild systemic disease. After reviewing the risks  ?                          and benefits, the patient was deemed in  ?                          satisfactory condition to undergo the procedure. ?                          After obtaining informed consent, the endoscope was  ?  passed under direct vision. Throughout the  ?                          procedure, the patient's blood pressure, pulse, and  ?                          oxygen saturations were monitored continuously. The  ?                          GIF [id number] #[id number] was introduced through the  ?                          mouth, and advanced to the second part of duodenum.  ?                          The upper GI endoscopy was accomplished  without  ?                          difficulty. The patient tolerated the procedure  ?                          well. ?Scope In: ?Scope Out: ?Findings:                 The examined esophagus was normal. ?                          Evidence of a fundoplication was found in the  ?                          cardia and in the gastric fundus. The wrap appeared  ?                          intact. ?                          A single 3 mm sessile polyp was found in the  ?                          gastric antrum. The polyp was removed with a cold  ?                          biopsy forceps. Resection and retrieval were  ?                          complete. ?                          The exam of the stomach was otherwise normal. ?                          Biopsies were taken with a cold forceps in the  ?                          gastric body, at the incisura and in the  gastric  ?                          antrum for histology and Helicobacter pylori  ?                          testing. ?                          The examined duodenum was normal. ?Complications:            No immediate complications. ?Estimated Blood Loss:     Estimated blood loss was minimal. ?Impression:               - Normal esophagus. ?                          - A fundoplication was found. The wrap appears  ?                          intact. ?                          - A single gastric polyp. Resected and retrieved. ?                          - Normal examined duodenum. ?                          - Biopsies were taken with a cold forceps for  ?                          histology and Helicobacter pylori testing. ?Recommendation:           - Patient has a contact number available for  ?                          emergencies. The signs and symptoms of potential  ?                          delayed complications were discussed with the  ?                          patient. Return to normal activities tomorrow.  ?                          Written discharge  instructions were provided to the  ?                          patient. ?                          - Resume previous diet. ?                          - Continue present medications. ?                          -  Await pathology results. ?                          - Repeat upper endoscopy in 3 years for screening  ?                          purposes. ?Jerene Bears, MD ?07/25/2021 9:13:21 AM ?This report has been signed electronically. ?

## 2021-07-29 ENCOUNTER — Telehealth: Payer: Self-pay

## 2021-07-29 ENCOUNTER — Encounter: Payer: Self-pay | Admitting: Internal Medicine

## 2021-07-29 NOTE — Telephone Encounter (Signed)
Attempted to reach patient for post-procedure f/u call. No answer. Left message for her to please not hesitate to call us if she has any questions/concerns regarding her care. 

## 2021-07-29 NOTE — Telephone Encounter (Signed)
?  Follow up Call- ? ? ?  07/25/2021  ?  7:49 AM 01/26/2019  ?  7:24 AM  ?Call back number  ?Post procedure Call Back phone  # [phone]  ?Permission to leave phone message Yes Yes  ?  ? ?Left Message ?

## 2021-07-31 ENCOUNTER — Ambulatory Visit (INDEPENDENT_AMBULATORY_CARE_PROVIDER_SITE_OTHER): Payer: Medicare HMO | Admitting: Nurse Practitioner

## 2021-07-31 VITALS — BP 130/80 | HR 94 | Temp 97.5°F | Wt 167.0 lb

## 2021-07-31 DIAGNOSIS — Z0000 Encounter for general adult medical examination without abnormal findings: Secondary | ICD-10-CM

## 2021-07-31 DIAGNOSIS — Z1509 Genetic susceptibility to other malignant neoplasm: Secondary | ICD-10-CM

## 2021-07-31 DIAGNOSIS — I8393 Asymptomatic varicose veins of bilateral lower extremities: Secondary | ICD-10-CM

## 2021-07-31 DIAGNOSIS — Z79899 Other long term (current) drug therapy: Secondary | ICD-10-CM

## 2021-07-31 DIAGNOSIS — E1140 Type 2 diabetes mellitus with diabetic neuropathy, unspecified: Secondary | ICD-10-CM

## 2021-07-31 DIAGNOSIS — E559 Vitamin D deficiency, unspecified: Secondary | ICD-10-CM

## 2021-07-31 DIAGNOSIS — E1165 Type 2 diabetes mellitus with hyperglycemia: Secondary | ICD-10-CM

## 2021-07-31 DIAGNOSIS — D128 Benign neoplasm of rectum: Secondary | ICD-10-CM

## 2021-07-31 DIAGNOSIS — I10 Essential (primary) hypertension: Secondary | ICD-10-CM

## 2021-07-31 DIAGNOSIS — F988 Other specified behavioral and emotional disorders with onset usually occurring in childhood and adolescence: Secondary | ICD-10-CM

## 2021-07-31 DIAGNOSIS — N182 Chronic kidney disease, stage 2 (mild): Secondary | ICD-10-CM

## 2021-07-31 DIAGNOSIS — E1169 Type 2 diabetes mellitus with other specified complication: Secondary | ICD-10-CM

## 2021-07-31 DIAGNOSIS — F330 Major depressive disorder, recurrent, mild: Secondary | ICD-10-CM

## 2021-07-31 DIAGNOSIS — E663 Overweight: Secondary | ICD-10-CM

## 2021-07-31 NOTE — Progress Notes (Signed)
INITIAL MEDICARE ANNUAL WELLNESS VISIT AND FOLLOW UP ? ?Assessment:  ? ? ?1. Initial Medicare annual wellness visit ?Due Yearly ? ?2. Essential hypertension ?Continue medications. ?Lifestyle modifications ?DASH diet. ? ?- CBC with Differential/Platelet ?- COMPLETE METABOLIC PANEL WITH GFR ? ?3. Varicose veins of both lower extremities, unspecified whether complicated ?Continue to monitor. ? ?4. 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 ? ?5. Type 2 diabetes mellitus with hyperglycemia, with long-term current use of insulin ([place]) ?Trulicity 1.5 mg ?Lifestyle modifications. ? ?- COMPLETE METABOLIC PANEL WITH GFR ?- Hemoglobin A1c ?- Urinalysis, Routine w reflex microscopic ? ?6. Hyperlipidemia associated with type 2 diabetes mellitus ([place]) ?Continue medications. ?Lifestyle modifications. ? ?- COMPLETE METABOLIC PANEL WITH GFR ?- Lipid panel ? ?7. CKD stage 2 due to type 2 diabetes mellitus ([place]) ?Continue ARB  ?Avoid nephrotoxic medications/foods. ?Keep BG well controlled. ? ?8. Neuropathy due to type 2 diabetes mellitus ([place]) ?Continue Gabapentin ? ?9. Mild episode of recurrent major depressive disorder ([place]) ?Controlled ?Continue Escitalopram 20 mg. ?Alprazolam PRN ? ?10. MSH2-related Lynch syndrome (HNPCC1) ?Continue to monitor.  ? ?11. Medication management ?All medication reviewed an discussed. ?All questions and concerns addressed. ? ?12. Vitamin D deficiency ?Continue Cholecalciferol ? ?13. Overweight (BMI 25.0-29.9) ?Lifestyle modifications. ? ?- COMPLETE METABOLIC PANEL WITH GFR ? ?14. Attention deficit disorder, unspecified hyperactivity presence ?Continue Vyvanse.  ? ? ?Over 40 minutes of exam, counseling, chart review and critical decision making was performed ?Future Appointments  ?Date Time Provider [place]  ?10/30/2021 10:00 AM Mull, Townsend Roger, NP [facility] None  ?08/03/2022 10:00 AM Darrol Jump, NP [facility] None  ? ? ? ?Plan:  ? ?During the course  of the visit the patient was educated and counseled about appropriate screening and preventive services including:  ? ?Pneumococcal vaccine  ?Prevnar 13 ?Influenza vaccine ?Td vaccine ?Screening electrocardiogram ?Bone densitometry screening ?Colorectal cancer screening ?Diabetes screening ?Glaucoma screening ?Nutrition counseling  ?Advanced directives: requested ? ? ?Subjective:  ?Sydney Dickson is a 66 y.o. female who presents for Medicare Annual Wellness Visit and 3 month follow up. 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 ? 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  For details regarding cancer risks and management, see letter dated 03/19/17.  ? 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  She is very active with him during the week. She does feel overall fatigued most of the time.   ? ?BMI is Body mass index is 28.22 kg/m?., she has been working on diet and exercise. ?Wt Readings from Last 3 Encounters:  ?07/31/21 167 lb (75.8 kg)  ?07/25/21 170 lb (77.1 kg)  ?07/04/21 170 lb (77.1 kg)  ? ? ?Her blood pressure has been controlled at home, today their BP is BP: 130/80 ?She does not workout. She denies chest pain, shortness of breath, dizziness.  ?She is on cholesterol medication and denies myalgias. Her cholesterol is not at goal. The  cholesterol  last visit was:   ?Lab Results  ?Component Value Date  ? CHOL 141 07/31/2021  ? HDL 42 (L) 07/31/2021  ? [place] 61 07/31/2021  ? TRIG 365 (H) 07/31/2021  ? CHOLHDL 3.4 07/31/2021  ? ?She has been working on diet and exercise for 3 months for prediabetes, and denies foot ulcerations, hyperglycemia, and hypoglycemia . Last A1C in the office was:  ?Lab Results  ?Component Value Date  ? HGBA1C 9.1 (H) 07/31/2021  ? ?Last GFR: ?Lab Results  ?Component Value Date  ? EGFR 83 07/31/2021  ? ?Patient is on Vitamin D supplement.   ?Lab Results  ?Component Value Date  ? VD25OH 35 12/30/2020  ?   ? ?Medication Review: ?Current Outpatient Medications on File Prior to Visit  ?Medication Sig Dispense Refill  ? acetaminophen (TYLENOL) 500 MG tablet Take 1,000 mg by mouth daily as needed for moderate pain or headache.    ? ALPRAZolam (XANAX) 0.5 MG tablet Take 1 tablet (0.5 mg total) by mouth 3 (three) times daily as needed. 90 tablet 0  ? aspirin 81 MG tablet Take 81 mg by mouth daily.    ? BD INSULIN SYRINGE U/F 31G X 5/16" 1 ML MISC USE AS DIRECTED TWICE A DAY 200 each 3  ? CHOLECALCIFEROL PO Take 5,000 Units by mouth daily.    ? Dulaglutide (TRULICITY) 1.5 GB/2.0FE SOPN Inject 1.5 mg into the skin once a week. 6 mL 1  ? escitalopram (LEXAPRO) 20 MG tablet TAKE 1 TABLET BY MOUTH DAILY FOR MOOD 90 tablet 1  ? fluticasone (FLONASE) 50 MCG/ACT nasal spray Place 2 sprays into both nostrils daily. 48 g 1  ? ibuprofen (ADVIL,MOTRIN) 800 MG tablet Take 1 tablet (800 mg total) by mouth every 8 (eight) hours as needed. 90 tablet 1  ? Lancets (ONETOUCH ULTRASOFT) lancets Use 3 lancets daily to check blood sugar-DX-E11.65 300 each 1  ? levocetirizine (XYZAL) 5 MG tablet Take  1 tablet  Daily for Allergies                                       /                                 TAKE                          BY                  MOUTH 90 tablet 3  ? lisdexamfetamine (VYVANSE) 20 MG capsule Take 1 tab daily in the morning as needed,  avoid taking daily due to risk for tolerance. 30 tabs should last longer than a month. 90 capsule 0  ? losartan (COZAAR) 100 MG tablet TAKE 1 TABLET DAILY FOR BP & DIABETIC KIDNEY PROTECTION 90 tablet 3  ? NOVOLIN 70/30 (70-30) 100 UNIT/ML injection INJECT 25 UNITS IN THE AM AND 20 UNITS AT DINNER 40 mL 2  ? olopatadine (PATANOL) 0.1 % ophthalmic solution PLACE 1 DROP INTO BOTH EYES 2 (TWO) TIMES DAILY. 5 mL 5  ? ONETOUCH VERIO test strip CHECK BLOOD SUGAR 3 TIMES DAILY-DX-E11.65 300 strip 1  ? tiZANidine (ZANAFLEX) 2 MG tablet TAKE 1 TABLET (2 MG TOTAL) BY MOUTH EVERY 6 (SIX) HOURS AS NEEDED FOR MUSCLE SPASMS. 60 tablet 0  ?  gabapentin (NEURONTIN) 100 MG capsule Take 1-3 capsules (100-300 mg total) by mouth at bedtime. (Patient not taking: Reported on 07/04/2021) 90 capsule 0  ? ipratropium (ATROVENT) 0.06 % nasal spray Place 2 sprays into the nose 3 (three) times daily. 15 mL 2  ? ?No current facility-administered medications on file prior to visit.  ? ? ?Allergies  ?Allergen Reactions  ? Tape Rash  ? Ace Inhibitors Cough  ? Metformin And Related   ?  myalgia  ? Morphine And Related Itching  ? Penicillins   ?  Pt states that in 3rd grade she was given an injection in the buttock and reacted with a large hive in that area ?Pt can take oral penicillin with no reaction  ?  ? Wellbutrin [Bupropion]   ?  Felt funny  ? ? ?Current Problems (verified) ?Patient Active Problem List  ? Diagnosis Date Noted  ? Neuropathy due to type 2 diabetes mellitus ([place]) 04/24/2021  ? Mild episode of recurrent major depressive disorder ([place]) 04/24/2021  ? CKD stage 2 due to type 2 diabetes mellitus ([place]) 04/23/2021  ? Nondisplaced fracture of lateral malleolus of left fibula, initial encounter for closed fracture 09/27/2018  ? MSH2-related Lynch syndrome (HNPCC1) 03/18/2017  ? Overweight (BMI 25.0-29.9) 10/11/2014  ? Vitamin D deficiency 10/11/2014  ? Medication management 10/11/2014  ? Adenomatous rectal polyp s/p TEM partial prioctectomy  07/19/2014 07/19/2014  ? ADD (attention deficit disorder)   ? Essential hypertension   ? Hyperlipidemia associated with type 2 diabetes mellitus ([place])   ? Type 2 diabetes mellitus with hyperglycemia (H[facility])

## 2021-08-01 ENCOUNTER — Other Ambulatory Visit: Payer: Self-pay | Admitting: Nurse Practitioner

## 2021-08-01 LAB — URINALYSIS, ROUTINE W REFLEX MICROSCOPIC
Bacteria, UA: NONE SEEN /HPF
Bilirubin Urine: NEGATIVE
Hgb urine dipstick: NEGATIVE
Hyaline Cast: NONE SEEN /LPF
Ketones, ur: NEGATIVE
Nitrite: NEGATIVE
Protein, ur: NEGATIVE
RBC / HPF: NONE SEEN /HPF (ref 0–2)
Specific Gravity, Urine: 1.027 (ref 1.001–1.035)
pH: 5.5 (ref 5.0–8.0)

## 2021-08-01 LAB — CBC WITH DIFFERENTIAL/PLATELET
Absolute Monocytes: 326 cells/uL (ref 200–950)
Basophils Absolute: 102 cells/uL (ref 0–200)
Basophils Relative: 1.6 %
Eosinophils Absolute: 397 cells/uL (ref 15–500)
Eosinophils Relative: 6.2 %
HCT: 43.8 % (ref 35.0–45.0)
Hemoglobin: 13.8 g/dL (ref 11.7–15.5)
Lymphs Abs: 2029 cells/uL (ref 850–3900)
MCH: 27.7 pg (ref 27.0–33.0)
MCHC: 31.5 g/dL — ABNORMAL LOW (ref 32.0–36.0)
MCV: 88 fL (ref 80.0–100.0)
MPV: 11.5 fL (ref 7.5–12.5)
Monocytes Relative: 5.1 %
Neutro Abs: 3546 cells/uL (ref 1500–7800)
Neutrophils Relative %: 55.4 %
Platelets: 297 10*3/uL (ref 140–400)
RBC: 4.98 10*6/uL (ref 3.80–5.10)
RDW: 12.4 % (ref 11.0–15.0)
Total Lymphocyte: 31.7 %
WBC: 6.4 10*3/uL (ref 3.8–10.8)

## 2021-08-01 LAB — HEMOGLOBIN A1C
Hgb A1c MFr Bld: 9.1 % of total Hgb — ABNORMAL HIGH (ref <5.7)
Mean Plasma Glucose: 214 mg/dL
eAG (mmol/L): 11.9 mmol/L

## 2021-08-01 LAB — COMPLETE METABOLIC PANEL WITH GFR
AG Ratio: 1.4 (calc) (ref 1.0–2.5)
ALT: 13 U/L (ref 6–29)
AST: 12 U/L (ref 10–35)
Albumin: 3.7 g/dL (ref 3.6–5.1)
Alkaline phosphatase (APISO): 90 U/L (ref 37–153)
BUN: 12 mg/dL (ref 7–25)
CO2: 25 mmol/L (ref 20–32)
Calcium: 9.2 mg/dL (ref 8.6–10.4)
Chloride: 103 mmol/L (ref 98–110)
Creat: 0.79 mg/dL (ref 0.50–1.05)
Globulin: 2.6 g/dL (calc) (ref 1.9–3.7)
Glucose, Bld: 398 mg/dL — ABNORMAL HIGH (ref 65–99)
Potassium: 4.1 mmol/L (ref 3.5–5.3)
Sodium: 138 mmol/L (ref 135–146)
Total Bilirubin: 0.4 mg/dL (ref 0.2–1.2)
Total Protein: 6.3 g/dL (ref 6.1–8.1)
eGFR: 83 mL/min/{1.73_m2} (ref >=60)

## 2021-08-01 LAB — LIPID PANEL
Cholesterol: 141 mg/dL (ref <200)
HDL: 42 mg/dL — ABNORMAL LOW (ref >=50)
LDL Cholesterol (Calc): 61 mg/dL (calc)
Non-HDL Cholesterol (Calc): 99 mg/dL (calc) (ref <130)
Total CHOL/HDL Ratio: 3.4 (calc) (ref <5.0)
Triglycerides: 365 mg/dL — ABNORMAL HIGH (ref <150)

## 2021-08-01 LAB — MICROSCOPIC MESSAGE

## 2021-08-01 MED ORDER — NITROFURANTOIN MONOHYD MACRO 100 MG PO CAPS: 100.0000 mg | ORAL_CAPSULE | Freq: Two times a day (BID) | ORAL | 0 refills | Status: AC

## 2021-08-08 ENCOUNTER — Other Ambulatory Visit: Payer: Self-pay | Admitting: Adult Health

## 2021-08-08 DIAGNOSIS — E785 Hyperlipidemia, unspecified: Secondary | ICD-10-CM

## 2021-09-14 ENCOUNTER — Other Ambulatory Visit: Payer: Self-pay | Admitting: Internal Medicine

## 2021-09-16 ENCOUNTER — Other Ambulatory Visit: Payer: Self-pay | Admitting: Adult Health

## 2021-09-23 ENCOUNTER — Telehealth: Payer: Self-pay | Admitting: Nurse Practitioner

## 2021-09-23 ENCOUNTER — Other Ambulatory Visit: Payer: Self-pay | Admitting: Nurse Practitioner

## 2021-09-23 MED ORDER — MOUNJARO 2.5 MG/0.5ML ~~LOC~~ SOAJ: 2.5000 mg | SUBCUTANEOUS | 0 refills | Status: DC

## 2021-09-23 NOTE — Telephone Encounter (Signed)
Pt was given samples of mounjaro last time she was in and is now wanting a prescription for the mounjaro to be [facility]

## 2021-09-24 ENCOUNTER — Telehealth: Payer: Self-pay

## 2021-09-24 NOTE — Telephone Encounter (Signed)
Mounjaro prior [place] approved. Patient aware.

## 2021-10-04 ENCOUNTER — Other Ambulatory Visit: Payer: Self-pay | Admitting: Adult Health

## 2021-10-10 ENCOUNTER — Other Ambulatory Visit: Payer: Self-pay | Admitting: Nurse Practitioner

## 2021-10-10 DIAGNOSIS — F330 Major depressive disorder, recurrent, mild: Secondary | ICD-10-CM

## 2021-10-22 ENCOUNTER — Other Ambulatory Visit: Payer: Self-pay | Admitting: Nurse Practitioner

## 2021-10-22 ENCOUNTER — Telehealth: Payer: Self-pay | Admitting: Nurse Practitioner

## 2021-10-22 DIAGNOSIS — F988 Other specified behavioral and emotional disorders with onset usually occurring in childhood and adolescence: Secondary | ICD-10-CM

## 2021-10-22 MED ORDER — LISDEXAMFETAMINE DIMESYLATE 20 MG PO CAPS: ORAL_CAPSULE | ORAL | 0 refills | Status: DC

## 2021-10-22 MED ORDER — MOUNJARO 2.5 MG/0.5ML ~~LOC~~ SOAJ: 2.5000 mg | SUBCUTANEOUS | 0 refills | Status: DC

## 2021-10-22 NOTE — Telephone Encounter (Signed)
Pt is refill on Mounjaro for 90 day supply and [facility]se to [facility] [place]

## 2021-10-23 ENCOUNTER — Other Ambulatory Visit: Payer: Self-pay | Admitting: Nurse Practitioner

## 2021-10-23 DIAGNOSIS — E119 Type 2 diabetes mellitus without complications: Secondary | ICD-10-CM | POA: Insufficient documentation

## 2021-10-24 NOTE — Telephone Encounter (Signed)
Can you check on Pa FOR THIS

## 2021-10-30 ENCOUNTER — Encounter: Payer: Self-pay | Admitting: Nurse Practitioner

## 2021-10-30 ENCOUNTER — Ambulatory Visit (INDEPENDENT_AMBULATORY_CARE_PROVIDER_SITE_OTHER): Payer: Medicare HMO | Admitting: Nurse Practitioner

## 2021-10-30 VITALS — BP 130/74 | HR 90 | Temp 97.3°F | Ht 64.0 in | Wt 168.8 lb

## 2021-10-30 DIAGNOSIS — Z124 Encounter for screening for malignant neoplasm of cervix: Secondary | ICD-10-CM

## 2021-10-30 DIAGNOSIS — I8393 Asymptomatic varicose veins of bilateral lower extremities: Secondary | ICD-10-CM

## 2021-10-30 DIAGNOSIS — E1140 Type 2 diabetes mellitus with diabetic neuropathy, unspecified: Secondary | ICD-10-CM

## 2021-10-30 DIAGNOSIS — Z136 Encounter for screening for cardiovascular disorders: Secondary | ICD-10-CM | POA: Diagnosis not present

## 2021-10-30 DIAGNOSIS — Z0001 Encounter for general adult medical examination with abnormal findings: Secondary | ICD-10-CM

## 2021-10-30 DIAGNOSIS — Z1509 Genetic susceptibility to other malignant neoplasm: Secondary | ICD-10-CM

## 2021-10-30 DIAGNOSIS — L989 Disorder of the skin and subcutaneous tissue, unspecified: Secondary | ICD-10-CM

## 2021-10-30 DIAGNOSIS — Z1389 Encounter for screening for other disorder: Secondary | ICD-10-CM

## 2021-10-30 DIAGNOSIS — G5762 Lesion of plantar nerve, left lower limb: Secondary | ICD-10-CM

## 2021-10-30 DIAGNOSIS — F988 Other specified behavioral and emotional disorders with onset usually occurring in childhood and adolescence: Secondary | ICD-10-CM

## 2021-10-30 DIAGNOSIS — I10 Essential (primary) hypertension: Secondary | ICD-10-CM | POA: Diagnosis not present

## 2021-10-30 DIAGNOSIS — Z0000 Encounter for general adult medical examination without abnormal findings: Secondary | ICD-10-CM

## 2021-10-30 DIAGNOSIS — Z806 Family history of leukemia: Secondary | ICD-10-CM

## 2021-10-30 DIAGNOSIS — Z794 Long term (current) use of insulin: Secondary | ICD-10-CM

## 2021-10-30 DIAGNOSIS — E1165 Type 2 diabetes mellitus with hyperglycemia: Secondary | ICD-10-CM

## 2021-10-30 DIAGNOSIS — E1169 Type 2 diabetes mellitus with other specified complication: Secondary | ICD-10-CM

## 2021-10-30 DIAGNOSIS — E559 Vitamin D deficiency, unspecified: Secondary | ICD-10-CM

## 2021-10-30 DIAGNOSIS — Z79899 Other long term (current) drug therapy: Secondary | ICD-10-CM

## 2021-10-30 DIAGNOSIS — E1122 Type 2 diabetes mellitus with diabetic chronic kidney disease: Secondary | ICD-10-CM

## 2021-10-30 DIAGNOSIS — F330 Major depressive disorder, recurrent, mild: Secondary | ICD-10-CM

## 2021-10-30 MED ORDER — MOUNJARO 5 MG/0.5ML ~~LOC~~ SOAJ: 5.0000 mg | SUBCUTANEOUS | 3 refills | Status: DC

## 2021-10-30 NOTE — Progress Notes (Signed)
COMPLETE PHYSICAL  Assessment and Plan:  Essential hypertension - continue medications: Losartan '100mg'$  daily DASH diet, exercise and monitor at home. Call if greater than 130/80.  -     CBC with Differential/Platelet -     CMP -     TSH -     Urinalysis, Routine w reflex microscopic -     Microalbumin / creatinine urine ratio -     EKG 12-Lead  Lynch syndrome -     Continue follow up GYN/GI Yearly colonoscopy, had 05/2021 Dr Hilarie Fredrickson  Diabetes mellitus with hyperlipidemia [facility]) Discussed general issues about diabetes pathophysiology and management., Educational material distributed., Suggested low cholesterol diet., Encouraged aerobic exercise., Discussed foot care., Reminded to get yearly retinal exam.  Medication management Continuned  Vitamin D deficiency Continue supplement  Varicose veins of both lower extremities, unspecified whether complicated - weight loss discussed, continue compression stockings and elevation - Refer to vein and vascular for evaluation  Attention deficit disorder, unspecified hyperactivity presence vyvanse 20 mg daily with benefit Doing well  Class 1 obesity due to excess calories with serious comorbidity and body mass index (BMI) of 31.0 to 31.9 in adult - follow up 3 months for progress monitoring - increase veggies, decrease carbs - long discussion about weight loss, diet, and exercise  Hyperlipidemia associated with type 2 diabetes mellitus ([place]) Continue medications: Rosuvastatin '20mg'$  daily Discussed dietary and exercise modifications Low fat diet -     Lipid panel   Type 2 diabetes mellitus with hyperglycemia, with long-term current use of insulin (H[facility]) Continue medications: novolin 70/30 : 25 units in am and 20 units in pm, likely will need to increase this. Taking Metformin '750mg'$  BID Mounjaro increase to 5 mg SQ QW Discussed general issues about diabetes pathophysiology and management. Education: Reviewed 'ABCs' of diabetes  management (respective goals in parentheses):  A1C (<7), blood pressure (<130/80), and cholesterol (LDL <70) Dietary recommendations Encouraged aerobic exercise.  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  Family history of CLL- brother and son - Iron/TIBC - Ferritin - serum protein electrophoresis - urine protein electroporesis     Discussed med's effects and SE's. Screening labs and tests as requested with regular follow-up as recommended. Over 40 minutes of face to face interview exam, counseling, chart review, and complex, high level critical decision making was performed this visit.   Future Appointments  Date Time Provider [place]  08/03/2022 10:00 AM Darrol Jump, NP [facility] None  11/02/2022 10:00 AM Alycia Rossetti, NP [facility] None    HPI  66 y.o. female  presents for a complete physical.  No health or medication concerns today.  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  She plans to follow with ENT.  She uses an app to track her blood glucose the average is 150-200. Highest 234.  She checks this  BID.She is taking Novolin 70/30, 25 units in am and 20 in HS.   Metformin '750mg'$  BID, Mounjaro 2.5 mg- currently in the donut hole. She is on ASA 81 mg daily. Losartan 100 mg daily for BP and kidney protection Discussed importance of lowering her blood glucose readings,  Dicsussed  increase RISK OF HEART ATTACK AND STROKE!  Also increased risk of organ damage. Lab Results  Component Value Date   HGBA1C 9.1 (H) 07/31/2021    She has area above right eyebrow that has been present for several weeks  Is not itchy or painful does not flake .   Her brother and son both have CLL and she would like testing to evaluate for this condition   She reports she walks her dog daily with her husband.  Keeps her 53 year old grandson every day. She reports she drink 64oz of water a day.  Bilateral hip pain x months, lower back bilateral. Declines referral to PT or ortho. Bilateral SI No pain down her legs, no weakness in her legs.  Hands are not hurting as bad.   BMI is Body mass index is 28.97 kg/m., she is working on diet and exercise. Wt Readings from Last 3 Encounters:  10/30/21 168 lb 12.8 oz (76.6 kg)  07/31/21 167 lb (75.8 kg)  07/25/21 170 lb (77.1 kg)   Her blood pressure has been controlled at home, today their BP is BP: 130/74  BP Readings from Last 3 Encounters:  10/30/21 130/74  07/31/21 130/80  07/25/21 127/72  She does workout. She denies chest pain, shortness of breath, dizziness.   Some seasonal allergies. No wheezing but has constant drainage, she is on xyzal . No GERD symptoms.   She is currently on Rosuvastatin 20 mg daily, denies Myalgias.   Lab Results  Component Value Date   CHOL 141 07/31/2021   HDL 42 (L) 07/31/2021   LDLCALC 61 07/31/2021   TRIG 365 (H) 07/31/2021   CHOLHDL 3.4 07/31/2021    Lab Results  Component Value Date   GFRNONAA 76 06/18/2020   Patient is on Vitamin D supplement.   Lab Results  Component Value Date   VD25OH 35 12/30/2020     She has anxiety/depression, is on xanax PRN and lexapro She has ADD and is on Vyvanse.  Patient has history of lynch syndrome, follows with Dr. Hilarie Fredrickson, had partial prioctectomy with Dr. Johney Maine on 07/19/2014.  They have also advised increase risk of other cancers including endometrial and  ovarian cancer, she has not had a TAH due to recent cancer diagnosis in her husband.    Current Medications:    Current Outpatient Medications (Endocrine & Metabolic):    NOVOLIN 24/40 (70-30) 100 UNIT/ML injection, INJECT 25 UNITS SUBCUTANEOUSLY IN THE MORNING AND 20 UNITS AT DINNER.   tirzepatide [facility]) 5 MG/0.5ML Pen, Inject 5 mg into the skin once a week.  Current Outpatient Medications (Cardiovascular):    losartan (COZAAR) 100 MG tablet, TAKE 1 TABLET DAILY FOR BP & DIABETIC KIDNEY PROTECTION   rosuvastatin (CRESTOR) 20 MG tablet, TAKE 1 TABLET BY MOUTH EVERY DAY FOR CHOLESTEROL  Current Outpatient Medications (Respiratory):    fluticasone (FLONASE) 50 MCG/ACT nasal spray, Place 2 sprays into both nostrils daily.   levocetirizine (XYZAL) 5 MG tablet, Take  1 tablet  Daily for Allergies                                       /  TAKE                          BY                  MOUTH   ipratropium (ATROVENT) 0.06 % nasal spray, Place 2 sprays into the nose 3 (three) times daily.  Current Outpatient Medications (Analgesics):    acetaminophen (TYLENOL) 500 MG tablet, Take 1,000 mg by mouth daily as needed for moderate pain or headache.   aspirin 81 MG tablet, Take 81 mg by mouth daily.   ibuprofen (ADVIL,MOTRIN) 800 MG tablet, Take 1 tablet (800 mg total) by mouth every 8 (eight) hours as needed.  Current Outpatient Medications (Hematological):    Cyanocobalamin (B-12 PO), Take by mouth.  Current Outpatient Medications (Other):    ALPRAZolam (XANAX) 0.5 MG tablet, Take 1 tablet (0.5 mg total) by mouth 3 (three) times daily as needed.   CHOLECALCIFEROL PO, Take 5,000 Units by mouth daily.   escitalopram (LEXAPRO) 20 MG tablet, TAKE 1 TABLET BY MOUTH DAILY FOR MOOD   lisdexamfetamine (VYVANSE) 20 MG capsule, Take 1 tab daily in the morning as needed, avoid taking daily due to risk for tolerance. 30 tabs should last longer than a month.   tiZANidine  (ZANAFLEX) 2 MG tablet, TAKE 1 TABLET (2 MG TOTAL) BY MOUTH EVERY 6 (SIX) HOURS AS NEEDED FOR MUSCLE SPASMS.   BD INSULIN SYRINGE U/F 31G X 5/16" 1 ML MISC, USE AS DIRECTED TWICE A DAY   gabapentin (NEURONTIN) 100 MG capsule, Take 1-3 capsules (100-300 mg total) by mouth at bedtime. (Patient not taking: Reported on 07/04/2021)   Lancets (ONETOUCH ULTRASOFT) lancets, Use 3 lancets daily to check blood sugar-DX-E11.65   olopatadine (PATANOL) 0.1 % ophthalmic solution, PLACE 1 DROP INTO BOTH EYES 2 (TWO) TIMES DAILY. (Patient not taking: Reported on 10/30/2021)   ONETOUCH VERIO test strip, CHECK BLOOD SUGAR 3 TIMES DAILY-DX-E11.65  Health Maintenance:   Immunization History  Administered Date(s) Administered   Influenza Inj Mdck Quad With Preservative 01/22/2017, 12/30/2017, 01/16/2019, 01/08/2020   Influenza Split 03/08/2014, 01/17/2015   Influenza, Seasonal, Injecte, Preservative Fre 01/08/2016   Influenza-Unspecified 03/20/2021   PPD Test 12/28/2013   Pneumococcal Polysaccharide-23 04/22/2015   Td 10/21/2005   Tdap 08/03/2013   Tetanus: 2015 Pneumovax: 2017 Prevnar 13:  Age 28 Flu vaccine: 2021 Zostavax: declines  No LMP recorded (lmp unknown). Patient is postmenopausal. Pap: today MGM: 05/2021 DEXA: Colonoscopy:07/25/21 EGD: 05/2017 Stress test 2007 CXR 2009 Eye exam 09/2020 no retinopathy Dentist: 06/2020  Patient Care Team: Unk Pinto, MD as PCP - General (Internal Medicine) Richmond Campbell, MD as Consulting Physician (Gastroenterology) Jannette Spanner, MD as Referring Physician (Dermatology) Rachel Moulds, DO (Internal Medicine) Garald Balding, MD as Consulting Physician (Orthopedic Surgery) Rutherford Guys, MD as Consulting Physician (Ophthalmology) Stanford Breed Denice Bors, MD as Consulting Physician (Cardiology) Michael Boston, MD as Consulting Physician (General Surgery) Pyrtle, Lajuan Lines, MD as Consulting Physician (Gastroenterology)  Medical History:  Past Medical  History:  Diagnosis Date   ADD (attention deficit disorder)    Allergy    Anxiety    Arthritis    Asthma    with severe allergies pt does use albuterol inhaler when needed   Cataract    removed both eyes   Depression    Diabetes mellitus without complication ([place]) 5465   TYPE 2    GERD (gastroesophageal reflux disease)    hx of had Nissen Fundoplication 0354 has  not had any issues since   History of hiatal hernia    Hyperlipidemia 2004   Hypertension    IBS (irritable bowel syndrome)    Lynch syndrome    Neuromuscular disorder (HCC)    Fibromyalgia   Tubular adenoma of colon    Allergies Allergies  Allergen Reactions   Tape Rash   Ace Inhibitors Cough   Metformin And Related     myalgia   Morphine And Related Itching   Penicillins     Pt states that in 3rd grade she was given an injection in the buttock and reacted with a large hive in that area Pt can take oral penicillin with no reaction     Wellbutrin [Bupropion]     Felt funny    SURGICAL HISTORY She  has a past surgical history that includes Hernia repair; Cholecystectomy; Tubal ligation; Cosmetic surgery; Rotator cuff repair (Right, 04/06/2010); Eye surgery (04/06/2004); Nissen fundoplication (26/94/8546); Carpal tunnel release (1998, 2001); Nasal sinus surgery; colonscopy ; Upper gi endoscopy; Partial proctectomy by tem (N/A, 07/19/2014); and Polypectomy.   FAMILY HISTORY Her family history includes Breast cancer in her mother and sister; Cancer in her mother and paternal grandmother; Colon cancer (age of onset: 7) in her brother; Colon cancer (age of onset: 16) in her paternal grandfather and another family member; Colon cancer (age of onset: 39) in her father; Colon polyps in her brother, father, paternal grandfather, and another family member; Diabetes in her mother; Hyperlipidemia in her father and mother; Hypertension in her father and mother; Prostate cancer in her father; Skin cancer in her father.    SOCIAL HISTORY She  reports that she has never smoked. She has never used smokeless tobacco. She reports that she does not drink alcohol and does not use drugs.  Review of Systems: Review of Systems  Constitutional:  Negative for chills, diaphoresis, fever, malaise/fatigue and weight loss.  HENT:  Negative for congestion, ear discharge, ear pain, hearing loss, nosebleeds, sinus pain, sore throat and tinnitus.        Dry mouth  Eyes:  Negative for blurred vision, double vision, photophobia, pain, discharge and redness.  Respiratory:  Negative for cough, hemoptysis, sputum production, shortness of breath, wheezing and stridor.   Cardiovascular:  Negative for chest pain, palpitations, orthopnea, claudication, leg swelling and PND.       Varicose veins   Gastrointestinal:  Negative for abdominal pain, blood in stool, constipation, diarrhea, heartburn, melena, nausea and vomiting.  Genitourinary:  Negative for dysuria, flank pain, frequency, hematuria and urgency.  Musculoskeletal:  Positive for back pain and joint pain. Negative for falls, myalgias and neck pain.  Skin:  Negative for itching and rash.       Lesion over right eye  Neurological:  Negative for dizziness, tingling, tremors, sensory change, speech change, focal weakness, seizures, loss of consciousness, weakness and headaches.  Endo/Heme/Allergies:  Negative for environmental allergies and polydipsia. Does not bruise/bleed easily.  Psychiatric/Behavioral:  Negative for depression, hallucinations, memory loss, substance abuse and suicidal ideas. The patient is not nervous/anxious and does not have insomnia.     Physical Exam: Estimated body mass index is 28.97 kg/m as calculated from the following:   Height as of this encounter: '5\' 4"'$  (1.626 m).   Weight as of this encounter: 168 lb 12.8 oz (76.6 kg). BP 130/74   Pulse 90   Temp (!) 97.3 F (36.3 C)   Ht '5\' 4"'$  (1.626 m)   Wt 168 lb 12.8 oz (76.6  kg)   LMP  (LMP Unknown)    SpO2 95%   BMI 28.97 kg/m    General Appearance: Well nourished, in no apparent distress.  Eyes: PERRLA, EOMs, conjunctiva no swelling or erythema, normal fundi and vessels.  Sinuses: No Frontal/maxillary tenderness  ENT/Mouth: Ext aud canals clear, normal light reflex with TMs without erythema, bulging.  No erythema, swelling, or exudate on post pharynx. Tonsils not swollen or erythematous. Hearing normal.  Neck: Supple, thyroid normal. No bruits  Respiratory: Respiratory effort normal, BS equal bilaterally without rales, rhonchi, wheezing or stridor.  Cardio: RRR without murmurs, rubs or gallops. Brisk peripheral pulses without edema. Varicose veins L>R lower extremities Chest: symmetric, with normal excursions and percussion.  Breasts: breasts appear normal, no suspicious masses, no skin or nipple changes or axillary nodes.  Abdomen: Soft, + epigastric tenderness, no guarding, rebound, hernias, masses, or organomegaly.  Lymphatics: Non tender without lymphadenopathy.  Pelvic exam: normal external genitalia, vulva, vagina, cervix, uterus and adnexa, PAP: Pap smear done today, thin-prep method. Musculoskeletal: Full ROM all peripheral extremities,5/5 strength, and normal gait.  Skin: reddened flat approx 2 cm area above right eyebrow Neuro: Cranial nerves intact, reflexes equal bilaterally. Normal muscle tone, no cerebellar symptoms. Sensation intact.  Psych: Awake and oriented X 3, normal affect, Insight and Judgment appropriate.   EKG: NSR, no ST changes    [facility]  10/30/2021

## 2021-10-30 NOTE — Patient Instructions (Signed)

## 2021-10-31 LAB — PAP, TP IMAGING W/ HPV RNA, RFLX HPV TYPE 16,18/45: HPV DNA High Risk: NOT DETECTED

## 2021-10-31 LAB — PAP, TP IMAGING, WNL RFLX HPV

## 2021-11-04 LAB — LIPID PANEL
Cholesterol: 131 mg/dL (ref <200)
HDL: 52 mg/dL (ref >=50)
LDL Cholesterol (Calc): 54 mg/dL (calc)
Non-HDL Cholesterol (Calc): 79 mg/dL (calc) (ref <130)
Total CHOL/HDL Ratio: 2.5 (calc) (ref <5.0)
Triglycerides: 178 mg/dL — ABNORMAL HIGH (ref <150)

## 2021-11-04 LAB — FERRITIN: Ferritin: 44 ng/mL (ref 16–288)

## 2021-11-04 LAB — COMPLETE METABOLIC PANEL WITH GFR
AG Ratio: 1.4 (calc) (ref 1.0–2.5)
ALT: 17 U/L (ref 6–29)
AST: 17 U/L (ref 10–35)
Albumin: 4 g/dL (ref 3.6–5.1)
Alkaline phosphatase (APISO): 95 U/L (ref 37–153)
BUN: 16 mg/dL (ref 7–25)
CO2: 27 mmol/L (ref 20–32)
Calcium: 9.7 mg/dL (ref 8.6–10.4)
Chloride: 106 mmol/L (ref 98–110)
Creat: 0.78 mg/dL (ref 0.50–1.05)
Globulin: 2.8 g/dL (calc) (ref 1.9–3.7)
Glucose, Bld: 242 mg/dL — ABNORMAL HIGH (ref 65–99)
Potassium: 4.5 mmol/L (ref 3.5–5.3)
Sodium: 140 mmol/L (ref 135–146)
Total Bilirubin: 0.6 mg/dL (ref 0.2–1.2)
Total Protein: 6.8 g/dL (ref 6.1–8.1)
eGFR: 84 mL/min/{1.73_m2} (ref >=60)

## 2021-11-04 LAB — URINALYSIS, ROUTINE W REFLEX MICROSCOPIC
Bacteria, UA: NONE SEEN /HPF
Bilirubin Urine: NEGATIVE
Hgb urine dipstick: NEGATIVE
Hyaline Cast: NONE SEEN /LPF
Ketones, ur: NEGATIVE
Nitrite: NEGATIVE
Protein, ur: NEGATIVE
RBC / HPF: NONE SEEN /HPF (ref 0–2)
Specific Gravity, Urine: 1.032 (ref 1.001–1.035)
pH: 5.5 (ref 5.0–8.0)

## 2021-11-04 LAB — CBC WITH DIFFERENTIAL/PLATELET
Absolute Monocytes: 460 cells/uL (ref 200–950)
Basophils Absolute: 117 cells/uL (ref 0–200)
Basophils Relative: 1.6 %
Eosinophils Absolute: 219 cells/uL (ref 15–500)
Eosinophils Relative: 3 %
HCT: 44.1 % (ref 35.0–45.0)
Hemoglobin: 14.3 g/dL (ref 11.7–15.5)
Lymphs Abs: 2876 cells/uL (ref 850–3900)
MCH: 27.9 pg (ref 27.0–33.0)
MCHC: 32.4 g/dL (ref 32.0–36.0)
MCV: 86 fL (ref 80.0–100.0)
MPV: 11 fL (ref 7.5–12.5)
Monocytes Relative: 6.3 %
Neutro Abs: 3628 cells/uL (ref 1500–7800)
Neutrophils Relative %: 49.7 %
Platelets: 336 10*3/uL (ref 140–400)
RBC: 5.13 10*6/uL — ABNORMAL HIGH (ref 3.80–5.10)
RDW: 12.5 % (ref 11.0–15.0)
Total Lymphocyte: 39.4 %
WBC: 7.3 10*3/uL (ref 3.8–10.8)

## 2021-11-04 LAB — HEMOGLOBIN A1C
Hgb A1c MFr Bld: 8 % of total Hgb — ABNORMAL HIGH (ref <5.7)
Mean Plasma Glucose: 183 mg/dL
eAG (mmol/L): 10.1 mmol/L

## 2021-11-04 LAB — MICROALBUMIN / CREATININE URINE RATIO
Creatinine, Urine: 112 mg/dL (ref 20–275)
Microalb Creat Ratio: 4 mcg/mg creat (ref <30)
Microalb, Ur: 0.5 mg/dL

## 2021-11-04 LAB — IRON, TOTAL/TOTAL IRON BINDING CAP
%SAT: 29 % (calc) (ref 16–45)
Iron: 98 ug/dL (ref 45–160)
TIBC: 342 mcg/dL (calc) (ref 250–450)

## 2021-11-04 LAB — PROTEIN,TOTAL AND PROTEIN ELECTROPHORESIS, RANDOM URINE(REFL)
Creatinine, Urine: 119 mg/dL (ref 20–275)
Protein/Creat Ratio: 76 mg/g creat (ref 24–184)
Protein/Creatinine Ratio: 0.076 mg/mg creat (ref 0.024–0.184)
Total Protein, Urine: 9 mg/dL (ref 5–24)

## 2021-11-04 LAB — PROTEIN ELECTROPHORESIS, SERUM, WITH REFLEX
Albumin ELP: 3.9 g/dL (ref 3.8–4.8)
Alpha 1: 0.2 g/dL (ref 0.2–0.3)
Alpha 2: 0.6 g/dL (ref 0.5–0.9)
Beta 2: 0.5 g/dL (ref 0.2–0.5)
Beta Globulin: 0.4 g/dL (ref 0.4–0.6)
Gamma Globulin: 1 g/dL (ref 0.8–1.7)
Total Protein: 6.7 g/dL (ref 6.1–8.1)

## 2021-11-04 LAB — MICROSCOPIC MESSAGE

## 2021-11-04 LAB — MAGNESIUM: Magnesium: 2 mg/dL (ref 1.5–2.5)

## 2021-11-04 LAB — TSH: TSH: 2.94 mIU/L (ref 0.40–4.50)

## 2021-11-04 LAB — VITAMIN D 25 HYDROXY (VIT D DEFICIENCY, FRACTURES): Vit D, 25-Hydroxy: 41 ng/mL (ref 30–100)

## 2021-11-13 ENCOUNTER — Encounter: Payer: Self-pay | Admitting: Podiatry

## 2021-11-13 ENCOUNTER — Ambulatory Visit (INDEPENDENT_AMBULATORY_CARE_PROVIDER_SITE_OTHER): Payer: Medicare HMO | Admitting: Podiatry

## 2021-11-13 ENCOUNTER — Ambulatory Visit (INDEPENDENT_AMBULATORY_CARE_PROVIDER_SITE_OTHER): Payer: Medicare HMO

## 2021-11-13 DIAGNOSIS — M79672 Pain in left foot: Secondary | ICD-10-CM | POA: Diagnosis not present

## 2021-11-13 DIAGNOSIS — D3613 Benign neoplasm of peripheral nerves and autonomic nervous system of lower limb, including hip: Secondary | ICD-10-CM

## 2021-11-13 DIAGNOSIS — G5782 Other specified mononeuropathies of left lower limb: Secondary | ICD-10-CM

## 2021-11-13 MED ORDER — TRIAMCINOLONE ACETONIDE 40 MG/ML IJ SUSP
20.0000 mg | Freq: Once | INTRAMUSCULAR | Status: AC
  Administered 2021-11-13: 20 mg
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
## 2021-12-21 ENCOUNTER — Other Ambulatory Visit: Payer: Self-pay | Admitting: Internal Medicine

## 2021-12-25 ENCOUNTER — Ambulatory Visit: Payer: 59 | Admitting: Podiatry

## 2022-01-06 ENCOUNTER — Encounter: Payer: Self-pay | Admitting: Internal Medicine

## 2022-01-28 ENCOUNTER — Telehealth: Payer: Self-pay | Admitting: Nurse Practitioner

## 2022-01-28 ENCOUNTER — Other Ambulatory Visit: Payer: Self-pay | Admitting: Nurse Practitioner

## 2022-01-28 DIAGNOSIS — F419 Anxiety disorder, unspecified: Secondary | ICD-10-CM

## 2022-01-28 DIAGNOSIS — F988 Other specified behavioral and emotional disorders with onset usually occurring in childhood and adolescence: Secondary | ICD-10-CM

## 2022-01-28 MED ORDER — LISDEXAMFETAMINE DIMESYLATE 20 MG PO CAPS: ORAL_CAPSULE | ORAL | 0 refills | Status: DC

## 2022-01-28 MED ORDER — ALPRAZOLAM 0.5 MG PO TABS: 0.5000 mg | ORAL_TABLET | Freq: Three times a day (TID) | ORAL | 0 refills | Status: DC | PRN

## 2022-01-28 NOTE — Telephone Encounter (Signed)
Done

## 2022-01-28 NOTE — Telephone Encounter (Signed)
Pt is requesting a refill on xanax and vyvanse to go to [facility] in [place]

## 2022-02-01 ENCOUNTER — Encounter: Payer: Self-pay | Admitting: Internal Medicine

## 2022-02-01 NOTE — Patient Instructions (Signed)
Due to recent changes in healthcare laws, you may see the results of your imaging and laboratory studies on MyChart before your provider has had a chance to review them.  We understand that in some cases there may be results that are confusing or concerning to you. Not all laboratory results come back in the same time frame and the provider may be waiting for multiple results in order to interpret others.  Please give us 48 hours in order for your provider to thoroughly review all the results before contacting the office for clarification of your results.  ++++++++++++++++++++++++++  Vit D  & Vit C 1,000 mg   are recommended to help protect  against the Covid-19 and other Corona viruses.    Also it's recommended  to take  Zinc 50 mg  to help  protect against the Covid-19   and best place to get  is also on [employer]  and don't pay more than 6-8 cents /pill !   +++++++++++++++++++++++++++++++++++++++ Recommend Adult Low Dose Aspirin or  coated  Aspirin 81 mg daily  To reduce risk of Colon Cancer 40 %,  Skin Cancer 26 % ,  Melanoma 46%  and  Pancreatic cancer 60% +++++++++++++++++++++++++++++++++++++++++ Vitamin D goal  is between 70-100.  Please make sure that you are taking your Vitamin D as directed.  It is very important as a natural anti-inflammatory  helping hair, skin, and nails, as well as reducing stroke and heart attack risk.  It helps your bones and helps with mood. It also decreases numerous cancer risks so please take it as directed.  Low Vit D is associated with a 200-300% higher risk for CANCER  and 200-300% higher risk for HEART   ATTACK  &  STROKE.   ...................................... It is also associated with higher death rate at younger ages,  autoimmune diseases like Rheumatoid arthritis, Lupus, Multiple Sclerosis.    Also many other serious conditions, like depression, Alzheimer's Dementia, infertility, muscle aches, fatigue, fibromyalgia - just to name  a few. +++++++++++++++++++++++++++++++++++++++++ Recommend the book "The END of DIETING" by Dr Joel Fuhrman  & the book "The END of DIABETES " by Dr Joel Fuhrman At [employer] - get book & Audio CD's    Being diabetic has a  300% increased risk for heart attack, stroke, cancer, and alzheimer- type vascular dementia. It is very important that you work harder with diet by avoiding all foods that are white. Avoid white rice (Romagnoli & wild rice is OK), white potatoes (sweetpotatoes in moderation is OK), White bread or wheat bread or anything made out of white flour like bagels, donuts, rolls, buns, biscuits, cakes, pastries, cookies, pizza crust, and pasta (made from white flour & egg whites) - vegetarian pasta or spinach or wheat pasta is OK. Multigrain breads like Arnold's or Pepperidge Farm, or multigrain sandwich thins or flatbreads.  Diet, exercise and weight loss can reverse and cure diabetes in the early stages.  Diet, exercise and weight loss is very important in the control and prevention of complications of diabetes which affects every system in your body, ie. Brain - dementia/stroke, eyes - glaucoma/blindness, heart - heart attack/heart failure, kidneys - dialysis, stomach - gastric paralysis, intestines - malabsorption, nerves - severe painful neuritis, circulation - gangrene & loss of a leg(s), and finally cancer and Alzheimers.    I recommend avoid fried & greasy foods,  sweets/candy, white rice ([place] or wild rice or Quinoa is OK), white potatoes (sweet potatoes are OK) - anything   made from white flour - bagels, doughnuts, rolls, buns, biscuits,white and wheat breads, pizza crust and traditional pasta made of white flour & egg white(vegetarian pasta or spinach or wheat pasta is OK).  Multi-grain bread is OK - like multi-grain flat bread or sandwich thins. Avoid alcohol in excess. Exercise is also important.    Eat all the vegetables you want - avoid meat, especially red meat and dairy - especially  cheese.  Cheese is the most concentrated form of trans-fats which is the worst thing to clog up our arteries. Veggie cheese is OK which can be found in the fresh produce section at [employer] or [employer] or [employer]  +++++++++++++++++++++++++++++++++++++++ DASH Eating Plan  DASH stands for "Dietary Approaches to Stop Hypertension."   The DASH eating plan is a healthy eating plan that has been shown to reduce high blood pressure (hypertension). Additional health benefits may include reducing the risk of type 2 diabetes mellitus, heart disease, and stroke. The DASH eating plan may also help with weight loss. WHAT DO I NEED TO KNOW ABOUT THE DASH EATING PLAN? For the DASH eating plan, you will follow these general guidelines: Choose foods with a percent daily value for sodium of less than 5% (as listed on the food label). Use salt-free seasonings or herbs instead of table salt or sea salt. Check with your health care provider or pharmacist before using salt substitutes. Eat lower-sodium products, often labeled as "lower sodium" or "no salt added." Eat fresh foods. Eat more vegetables, fruits, and low-fat dairy products. Choose whole grains. Look for the word "whole" as the first word in the ingredient list. Choose fish  Limit sweets, desserts, sugars, and sugary drinks. Choose heart-healthy fats. Eat veggie cheese  Eat more home-cooked food and less restaurant, buffet, and fast food. Limit fried foods. Cook foods using methods other than frying. Limit canned vegetables. If you do use them, rinse them well to decrease the sodium. When eating at a restaurant, ask that your food be prepared with less salt, or no salt if possible.                      WHAT FOODS CAN I EAT? Read Dr Joel Fuhrman's books on The End of Dieting & The End of Diabetes  Grains Whole grain or whole wheat bread. Wolfson rice. Whole grain or whole wheat pasta. Quinoa, bulgur, and whole grain cereals. Low-sodium  cereals. Corn or whole wheat flour tortillas. Whole grain cornbread. Whole grain crackers. Low-sodium crackers.  Vegetables Fresh or frozen vegetables (raw, steamed, roasted, or grilled). Low-sodium or reduced-sodium tomato and vegetable juices. Low-sodium or reduced-sodium tomato sauce and paste. Low-sodium or reduced-sodium canned vegetables.   Fruits All fresh, canned (in natural juice), or frozen fruits.  Protein Products  All fish and seafood.  Dried beans, peas, or lentils. Unsalted nuts and seeds. Unsalted canned beans.  Dairy Low-fat dairy products, such as skim or 1% milk, 2% or reduced-fat cheeses, low-fat ricotta or cottage cheese, or plain low-fat yogurt. Low-sodium or reduced-sodium cheeses.  Fats and Oils Tub margarines without trans fats. Light or reduced-fat mayonnaise and salad dressings (reduced sodium). Avocado. Safflower, olive, or canola oils. Natural peanut or almond butter.  Other Unsalted popcorn and pretzels. The items listed above may not be a complete list of recommended foods or beverages. Contact your dietitian for more options.  +++++++++++++++  WHAT FOODS ARE NOT RECOMMENDED? Grains/ White flour or wheat flour White bread. White pasta. White rice. Refined   cornbread. Bagels and croissants. Crackers that contain trans fat.  Vegetables  Creamed or fried vegetables. Vegetables in a . Regular canned vegetables. Regular canned tomato sauce and paste. Regular tomato and vegetable juices.  Fruits Dried fruits. Canned fruit in light or heavy syrup. Fruit juice.  Meat and Other Protein Products Meat in general - RED meat & White meat.  Fatty cuts of meat. Ribs, chicken wings, all processed meats as bacon, sausage, bologna, salami, fatback, hot dogs, bratwurst and packaged luncheon meats.  Dairy Whole or 2% milk, cream, half-and-half, and cream cheese. Whole-fat or sweetened yogurt. Full-fat cheeses or blue cheese. Non-dairy creamers and whipped toppings.  Processed cheese, cheese spreads, or cheese curds.  Condiments Onion and garlic salt, seasoned salt, table salt, and sea salt. Canned and packaged gravies. Worcestershire sauce. Tartar sauce. Barbecue sauce. Teriyaki sauce. Soy sauce, including reduced sodium. Steak sauce. Fish sauce. Oyster sauce. Cocktail sauce. Horseradish. Ketchup and mustard. Meat flavorings and tenderizers. Bouillon cubes. Hot sauce. Tabasco sauce. Marinades. Taco seasonings. Relishes.  Fats and Oils Butter, stick margarine, lard, shortening and bacon fat. Coconut, palm kernel, or palm oils. Regular salad dressings.  Pickles and olives. Salted popcorn and pretzels.  The items listed above may not be a complete list of foods and beverages to avoid.  
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
## 2022-02-02 ENCOUNTER — Encounter: Payer: Self-pay | Admitting: Internal Medicine

## 2022-02-02 ENCOUNTER — Ambulatory Visit (INDEPENDENT_AMBULATORY_CARE_PROVIDER_SITE_OTHER): Payer: Medicare HMO | Admitting: Internal Medicine

## 2022-02-02 VITALS — BP 132/80 | HR 79 | Temp 97.9°F | Resp 16 | Ht 64.0 in | Wt 168.4 lb

## 2022-02-02 DIAGNOSIS — E559 Vitamin D deficiency, unspecified: Secondary | ICD-10-CM | POA: Diagnosis not present

## 2022-02-02 DIAGNOSIS — E1169 Type 2 diabetes mellitus with other specified complication: Secondary | ICD-10-CM | POA: Diagnosis not present

## 2022-02-02 DIAGNOSIS — N182 Chronic kidney disease, stage 2 (mild): Secondary | ICD-10-CM

## 2022-02-02 DIAGNOSIS — E1122 Type 2 diabetes mellitus with diabetic chronic kidney disease: Secondary | ICD-10-CM | POA: Diagnosis not present

## 2022-02-02 DIAGNOSIS — I10 Essential (primary) hypertension: Secondary | ICD-10-CM | POA: Diagnosis not present

## 2022-02-02 DIAGNOSIS — Z79899 Other long term (current) drug therapy: Secondary | ICD-10-CM

## 2022-02-02 DIAGNOSIS — E785 Hyperlipidemia, unspecified: Secondary | ICD-10-CM

## 2022-02-02 DIAGNOSIS — Z794 Long term (current) use of insulin: Secondary | ICD-10-CM

## 2022-02-02 DIAGNOSIS — Z23 Encounter for immunization: Secondary | ICD-10-CM

## 2022-02-03 LAB — CBC WITH DIFFERENTIAL/PLATELET
Absolute Monocytes: 498 cells/uL (ref 200–950)
Basophils Absolute: 108 cells/uL (ref 0–200)
Basophils Relative: 1.3 %
Eosinophils Absolute: 249 cells/uL (ref 15–500)
Eosinophils Relative: 3 %
HCT: 43.8 % (ref 35.0–45.0)
Hemoglobin: 14.6 g/dL (ref 11.7–15.5)
Lymphs Abs: 2706 cells/uL (ref 850–3900)
MCH: 28.2 pg (ref 27.0–33.0)
MCHC: 33.3 g/dL (ref 32.0–36.0)
MCV: 84.7 fL (ref 80.0–100.0)
MPV: 10.7 fL (ref 7.5–12.5)
Monocytes Relative: 6 %
Neutro Abs: 4739 cells/uL (ref 1500–7800)
Neutrophils Relative %: 57.1 %
Platelets: 363 10*3/uL (ref 140–400)
RBC: 5.17 10*6/uL — ABNORMAL HIGH (ref 3.80–5.10)
RDW: 12.4 % (ref 11.0–15.0)
Total Lymphocyte: 32.6 %
WBC: 8.3 10*3/uL (ref 3.8–10.8)

## 2022-02-03 LAB — MAGNESIUM: Magnesium: 1.8 mg/dL (ref 1.5–2.5)

## 2022-02-03 LAB — LIPID PANEL
Cholesterol: 132 mg/dL (ref <200)
HDL: 47 mg/dL — ABNORMAL LOW (ref >=50)
LDL Cholesterol (Calc): 62 mg/dL (calc)
Non-HDL Cholesterol (Calc): 85 mg/dL (calc) (ref <130)
Total CHOL/HDL Ratio: 2.8 (calc) (ref <5.0)
Triglycerides: 155 mg/dL — ABNORMAL HIGH (ref <150)

## 2022-02-03 LAB — COMPLETE METABOLIC PANEL WITH GFR
AG Ratio: 1.4 (calc) (ref 1.0–2.5)
ALT: 20 U/L (ref 6–29)
AST: 15 U/L (ref 10–35)
Albumin: 3.9 g/dL (ref 3.6–5.1)
Alkaline phosphatase (APISO): 107 U/L (ref 37–153)
BUN: 12 mg/dL (ref 7–25)
CO2: 27 mmol/L (ref 20–32)
Calcium: 9.5 mg/dL (ref 8.6–10.4)
Chloride: 103 mmol/L (ref 98–110)
Creat: 0.77 mg/dL (ref 0.50–1.05)
Globulin: 2.8 g/dL (calc) (ref 1.9–3.7)
Glucose, Bld: 244 mg/dL — ABNORMAL HIGH (ref 65–99)
Potassium: 4.1 mmol/L (ref 3.5–5.3)
Sodium: 138 mmol/L (ref 135–146)
Total Bilirubin: 0.5 mg/dL (ref 0.2–1.2)
Total Protein: 6.7 g/dL (ref 6.1–8.1)
eGFR: 86 mL/min/{1.73_m2} (ref >=60)

## 2022-02-03 LAB — HEMOGLOBIN A1C
Hgb A1c MFr Bld: 9 % of total Hgb — ABNORMAL HIGH (ref <5.7)
Mean Plasma Glucose: 212 mg/dL
eAG (mmol/L): 11.7 mmol/L

## 2022-02-03 LAB — TSH: TSH: 3.39 mIU/L (ref 0.40–4.50)

## 2022-02-03 LAB — VITAMIN D 25 HYDROXY (VIT D DEFICIENCY, FRACTURES): Vit D, 25-Hydroxy: 43 ng/mL (ref 30–100)

## 2022-02-03 NOTE — Progress Notes (Signed)
<><><><><><><><><><><><><><><><><><><><><><><><><><><><><><><><><> <><><><><><><><><><><><><><><><><><><><><><><><><><><><><><><><><> - Test results slightly outside the reference range are not unusual. If there is anything important, I will review this with you,  otherwise it is considered normal test values.  If you have further questions,  please do not hesitate to contact me at the office or via My Chart.  <><><><><><><><><><><><><><><><><><><><><><><><><><><><><><><><><> <><><><><><><><><><><><><><><><><><><><><><><><><><><><><><><><><>  -  Glucose = 244 mg%  - is way too high          ( Ideal  or Goal is less than 120 mg %   !  )  &   A1c = 9.0% - ( average glucose = 212 mg%           (  Ideal or Goal is less than 5.7%  !  )   - So you need to work harder on a plant based diet so as your sugars begin to drop                        Then you can begin tapering your Insulin dose   Right now , when you take Insulin you have to eat to keep up with your insulin whch   is putting the calories in your fat stores  &                                                    as you gain weight that just makes you more Diabetic !   - So what I'm suggesting I that you begin                                  cutting down on your insulin doses a few units every 5 days or so  &   therefore , you you won't be eating / or snacking just to keep up with your insulin <><><><><><><><><><><><><><><><><><><><><><><><><><><><><><><><><>  - Because at the end of the game,  who's going to pay the price for your overeating ? <><><><><><><><><><><><><><><><><><><><><><><><><><><><><><><><><>  - Vit d = 43  - is very low   - Vitamin D goal is between 70-100.   - Please INCREASE  your Vitamin D 5,000 units                                                                                 up to 2 capsules = 10,000 units / day .    - Vitamin D  is very important as a natural anti-inflammatory and helping the                                         immune system protect against viral infections, like the Covid-19    - also, helping hair, skin, and nails, as well as reducing stroke and heart attack risk.   - It helps your bones and helps with mood.  - It also decreases  numerous cancer risks so please                                                                                        take it as directed.   - Low Vit D is associated with a 200-300% higher risk for CANCER   and 200-300% higher risk for HEART   ATTACK  &  STROKE.    - It is also associated with higher death rate at younger ages,   autoimmune diseases like Rheumatoid arthritis, Lupus,  Multiple Sclerosis.     - Also many other serious conditions, like Depression, Alzheimer's  Dementia, infertility, muscle aches, fatigue, fibromyalgia  <><><><><><><><><><><><><><><><><><><><><><><><><><><><><><><><><> <><><><><><><><><><><><><><><><><><><><><><><><><><><><><><><><><>  - All Else - CBC - Kidneys - Electrolytes - Liver - Magnesium & Thyroid    - all  Normal / OK  <><><><><><><><><><><><><><><><><><><><><><><><><><><><><><><><><> <><><><><><><><><><><><><><><><><><><><><><><><><><><><><><><><><>

## 2022-02-10 ENCOUNTER — Other Ambulatory Visit: Payer: Self-pay | Admitting: Nurse Practitioner

## 2022-02-10 DIAGNOSIS — E1165 Type 2 diabetes mellitus with hyperglycemia: Secondary | ICD-10-CM

## 2022-02-17 ENCOUNTER — Other Ambulatory Visit: Payer: Self-pay | Admitting: Internal Medicine

## 2022-02-17 DIAGNOSIS — E1122 Type 2 diabetes mellitus with diabetic chronic kidney disease: Secondary | ICD-10-CM

## 2022-02-17 MED ORDER — TIRZEPATIDE 7.5 MG/0.5ML ~~LOC~~ SOAJ: SUBCUTANEOUS | 0 refills | Status: DC

## 2022-03-05 ENCOUNTER — Encounter: Payer: Self-pay | Admitting: Internal Medicine

## 2022-03-16 ENCOUNTER — Other Ambulatory Visit: Payer: Self-pay | Admitting: Internal Medicine

## 2022-03-16 DIAGNOSIS — E1122 Type 2 diabetes mellitus with diabetic chronic kidney disease: Secondary | ICD-10-CM

## 2022-04-17 ENCOUNTER — Other Ambulatory Visit: Payer: Self-pay | Admitting: Internal Medicine

## 2022-04-17 DIAGNOSIS — F330 Major depressive disorder, recurrent, mild: Secondary | ICD-10-CM

## 2022-05-05 ENCOUNTER — Ambulatory Visit (INDEPENDENT_AMBULATORY_CARE_PROVIDER_SITE_OTHER): Payer: Medicare HMO | Admitting: Nurse Practitioner

## 2022-05-05 ENCOUNTER — Encounter: Payer: Self-pay | Admitting: Nurse Practitioner

## 2022-05-05 VITALS — BP 136/80 | HR 91 | Temp 97.5°F | Ht 64.0 in | Wt 164.2 lb

## 2022-05-05 DIAGNOSIS — D128 Benign neoplasm of rectum: Secondary | ICD-10-CM

## 2022-05-05 DIAGNOSIS — E663 Overweight: Secondary | ICD-10-CM

## 2022-05-05 DIAGNOSIS — Z0001 Encounter for general adult medical examination with abnormal findings: Secondary | ICD-10-CM | POA: Diagnosis not present

## 2022-05-05 DIAGNOSIS — Z1509 Genetic susceptibility to other malignant neoplasm: Secondary | ICD-10-CM

## 2022-05-05 DIAGNOSIS — Z0000 Encounter for general adult medical examination without abnormal findings: Secondary | ICD-10-CM

## 2022-05-05 DIAGNOSIS — E1169 Type 2 diabetes mellitus with other specified complication: Secondary | ICD-10-CM

## 2022-05-05 DIAGNOSIS — E1165 Type 2 diabetes mellitus with hyperglycemia: Secondary | ICD-10-CM

## 2022-05-05 DIAGNOSIS — R6889 Other general symptoms and signs: Secondary | ICD-10-CM | POA: Diagnosis not present

## 2022-05-05 DIAGNOSIS — I8393 Asymptomatic varicose veins of bilateral lower extremities: Secondary | ICD-10-CM | POA: Diagnosis not present

## 2022-05-05 DIAGNOSIS — E1140 Type 2 diabetes mellitus with diabetic neuropathy, unspecified: Secondary | ICD-10-CM

## 2022-05-05 DIAGNOSIS — I10 Essential (primary) hypertension: Secondary | ICD-10-CM

## 2022-05-05 DIAGNOSIS — F330 Major depressive disorder, recurrent, mild: Secondary | ICD-10-CM

## 2022-05-05 DIAGNOSIS — J0110 Acute frontal sinusitis, unspecified: Secondary | ICD-10-CM

## 2022-05-05 DIAGNOSIS — E559 Vitamin D deficiency, unspecified: Secondary | ICD-10-CM

## 2022-05-05 DIAGNOSIS — E1122 Type 2 diabetes mellitus with diabetic chronic kidney disease: Secondary | ICD-10-CM

## 2022-05-05 DIAGNOSIS — E785 Hyperlipidemia, unspecified: Secondary | ICD-10-CM

## 2022-05-05 DIAGNOSIS — Z79899 Other long term (current) drug therapy: Secondary | ICD-10-CM

## 2022-05-05 DIAGNOSIS — F988 Other specified behavioral and emotional disorders with onset usually occurring in childhood and adolescence: Secondary | ICD-10-CM

## 2022-05-05 DIAGNOSIS — Z794 Long term (current) use of insulin: Secondary | ICD-10-CM

## 2022-05-05 DIAGNOSIS — N182 Chronic kidney disease, stage 2 (mild): Secondary | ICD-10-CM

## 2022-05-05 MED ORDER — LISDEXAMFETAMINE DIMESYLATE 20 MG PO CAPS: ORAL_CAPSULE | ORAL | 0 refills | Status: DC

## 2022-05-05 MED ORDER — AZITHROMYCIN 500 MG PO TABS: 500.0000 mg | ORAL_TABLET | Freq: Every day | ORAL | 0 refills | Status: AC

## 2022-05-05 NOTE — Progress Notes (Signed)
INITIAL MEDICARE ANNUAL WELLNESS VISIT AND FOLLOW UP  Assessment:    Medicare annual wellness visit Due Yearly Health maintenance reviewed  Essential hypertension Discussed DASH (Dietary Approaches to Stop Hypertension) DASH diet is lower in sodium than a typical American diet. Cut back on foods that are high in saturated fat, cholesterol, and trans fats. Eat more whole-grain foods, fish, poultry, and nuts Remain active and exercise as tolerated daily.  Monitor BP at home-Call if greater than 130/80.  Check CMP/CBC  Varicose veins of both lower extremities, unspecified whether complicated Continue to monitor.  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  Type 2 diabetes mellitus with hyperglycemia, with long-term current use of insulin [facility]) Education: Reviewed 'ABCs' of diabetes management  Discussed goals to be met and/or maintained include A1C (<7) Blood pressure (<130/80) Cholesterol (LDL <70) Continue Eye Exam yearly  Continue Dental Exam Q6 mo Discussed dietary recommendations Discussed Physical Activity recommendations Check A1C   Hyperlipidemia associated with type 2 diabetes mellitus ([place]) Discussed lifestyle modifications. Recommended diet heavy in fruits and veggies, omega 3's. Decrease consumption of animal meats, cheeses, and dairy products. Remain active and exercise as tolerated. Continue to monitor. Check lipids/TSH   CKD stage 2 due to type 2 diabetes mellitus (HCC) Continue ACE, bASA, statin Discussed how what you eat and drink can aide in kidney protection. Stay well hydrated. Avoid high salt foods. Avoid NSAIDS. Keep BP and BG well controlled.   Take medications as prescribed. Remain active and exercise as tolerated daily. Maintain weight.  Continue to monitor.  Neuropathy due to type 2 diabetes mellitus (HCC) Continue Gabapentin  Mild episode of recurrent major depressive disorder  (HCC) Controlled Continue Escitalopram 20 mg. Alprazolam PRN  MSH2-related Lynch syndrome (HNPCC1) Continue to monitor.   Medication management All medication reviewed an discussed. All questions and concerns addressed.  Vitamin D deficiency Continue Cholecalciferol  Overweight (BMI 25.0-29.9) Discussed appropriate BMI Diet modification. Physical activity. Encouraged/praised to build confidence.   Attention deficit disorder, unspecified hyperactivity presence Continue Vyvanse.  Medications providing over 30% improvement of attention deficit hyperactivity disorder. No change in management. Using lowest effective dose. Discussed alternative pharmacological and non-pharmacological therapies. No suspected aberrant drug-taking behaviors. Continue to monitor  Orders Placed This Encounter  Procedures   CBC with Differential/Platelet   COMPLETE METABOLIC PANEL WITH GFR   Lipid panel   Hemoglobin A1c    Notify office for further evaluation and treatment, questions or concerns if any reported s/s fail to improve.   The patient was advised to call back or seek an in-person evaluation if any symptoms worsen or if the condition fails to improve as anticipated.   Further disposition pending results of labs. Discussed med's effects and SE's.    I discussed the assessment and treatment plan with the patient. The patient was provided an opportunity to ask questions and all were answered. The patient agreed with the plan and demonstrated an understanding of the instructions.  Discussed med's effects and SE's. Screening labs and tests as requested with regular follow-up as recommended.  I provided 30 minutes of face-to-face time during this encounter including counseling, chart review, and critical decision making was preformed.  Future Appointments  Date Time Provider [place]  08/04/2022 10:30 AM Darrol Jump, NP [facility] None  11/04/2022  9:00 AM Alycia Rossetti,  NP [facility] None  05/06/2023 11:00 AM Darrol Jump, NP [facility] None     Plan:   During the course of the visit the  patient was educated and counseled about appropriate screening and preventive services including:   Pneumococcal vaccine  Prevnar 13 Influenza vaccine Td vaccine Screening electrocardiogram Bone densitometry screening Colorectal cancer screening Diabetes screening Glaucoma screening Nutrition counseling  Advanced directives: requested   Subjective:  Sydney Dickson is a 67 y.o. female who presents for Medicare Annual Wellness Visit and 3 month follow up. 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  Overall she reports feeling well.  She reports having sinus congestion with HA, gum pain, pain in right side of cheek and front of head x 3 weeks.  Felt to be sick over the Christmas holiday and symptoms are lingering.  She has taken OTC antihistamines and decongestants without relief.   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  She is very active with him during the week.    Has depression that is controlled with Lexapro and Xanax PRN.  Also takes Vyvanse for ADHD.  Reports medications are currently effective.  BMI is Body mass index is 28.18 kg/m., she has been working on diet and exercise. Wt Readings from Last 3 Encounters:   05/05/22 164 lb 3.2 oz (74.5 kg)  02/02/22 168 lb 6.4 oz (76.4 kg)  10/30/21 168 lb 12.8 oz (76.6 kg)    Her blood pressure has been controlled at home, today their BP is BP: 136/80 She does not workout. She denies chest pain, shortness of breath, dizziness.  She is on cholesterol medication and denies myalgias. Her cholesterol is not at goal. The cholesterol last visit was:   Lab Results  Component Value Date   CHOL 117 05/05/2022   HDL 50 05/05/2022   LDLCALC 49 05/05/2022   TRIG 98 05/05/2022   CHOLHDL 2.3 05/05/2022   She has been working on diet and exercise for 3 months for prediabetes, and denies foot ulcerations, hyperglycemia, and hypoglycemia .  Endorses occasional neuropath, however, currently controlled. Last A1C in the office was:  Lab Results  Component Value Date   HGBA1C 9.7 (H) 05/05/2022   Last GFR: Lab Results  Component Value Date   EGFR 78 05/05/2022   Patient is on Vitamin D supplement.   Lab Results  Component Value Date   VD25OH 43 02/02/2022      Medication Review: Current Outpatient Medications on File Prior to Visit  Medication Sig Dispense Refill   acetaminophen (TYLENOL) 500 MG tablet Take 1,000 mg by mouth daily as needed for moderate pain or headache.     ALPRAZolam (XANAX) 0.5 MG tablet Take 1 tablet (0.5 mg total) by mouth 3 (three) times daily as needed. 90 tablet 0   aspirin 81 MG tablet Take 81 mg by mouth daily.     BD INSULIN SYRINGE U/F 31G X 5/16" 1 ML MISC USE AS DIRECTED TWICE A DAY 200 each 3   CHOLECALCIFEROL PO Take 5,000 Units by mouth daily.     Cyanocobalamin (B-12 PO) Take by mouth.     escitalopram (  LEXAPRO) 20 MG tablet TAKE 1 TABLET BY MOUTH DAILY FOR MOOD 90 tablet 1   fluticasone (FLONASE) 50 MCG/ACT nasal spray Place 2 sprays into both nostrils daily. 48 g 1   ibuprofen (ADVIL,MOTRIN) 800 MG tablet Take 1 tablet (800 mg total) by mouth every 8 (eight) hours as needed. 90 tablet 1   ipratropium (ATROVENT) 0.06 % nasal  spray Place 2 sprays into the nose 3 (three) times daily. 15 mL 2   Lancets (ONETOUCH ULTRASOFT) lancets Use 3 lancets daily to check blood sugar-DX-E11.65 300 each 1   levocetirizine (XYZAL) 5 MG tablet Take  1 tablet  Daily for Allergies                                       /                                 TAKE                          BY                  MOUTH 90 tablet 3   losartan (COZAAR) 100 MG tablet TAKE 1 TABLET DAILY FOR BP & DIABETIC KIDNEY PROTECTION 90 tablet 3   NOVOLIN 70/30 (70-30) 100 UNIT/ML injection INJECT 25 UNITS SUBCUTANEOUSLY IN THE MORNING AND 20 UNITS AT DINNER. 40 mL 2   ONETOUCH VERIO test strip CHECK BLOOD SUGAR 3 TIMES DAILY-DX-E11.65 300 strip 1   rosuvastatin (CRESTOR) 20 MG tablet TAKE 1 TABLET BY MOUTH EVERY DAY FOR CHOLESTEROL 90 tablet 3   tirzepatide (MOUNJARO) 7.5 MG/0.5ML Pen INJECT 7.5 MG INTO SKIN EVERY 7 DAYS FOR DIABETES ( DX: E11.29) 2 mL 3   tiZANidine (ZANAFLEX) 2 MG tablet TAKE 1 TABLET (2 MG TOTAL) BY MOUTH EVERY 6 (SIX) HOURS AS NEEDED FOR MUSCLE SPASMS. 60 tablet 0   No current facility-administered medications on file prior to visit.    Allergies  Allergen Reactions   Tape Rash   Ace Inhibitors Cough   Metformin And Related     myalgia   Morphine And Related Itching   Penicillins     Pt states that in 3rd grade she was given an injection in the buttock and reacted with a large hive in that area Pt can take oral penicillin with no reaction     Wellbutrin [Bupropion]     Felt funny    Current Problems (verified) Patient Active Problem List   Diagnosis Date Noted   Neuropathy due to type 2 diabetes mellitus ([place]) 04/24/2021   Mild episode of recurrent major depressive disorder ([place]) 04/24/2021   CKD stage 2 due to type 2 diabetes mellitus ([place]) 04/23/2021   Nondisplaced fracture of lateral malleolus of left fibula, initial encounter for closed fracture 09/27/2018   MSH2-related Lynch syndrome (HNPCC1) 03/18/2017   Overweight (BMI  25.0-29.9) 10/11/2014   Vitamin D deficiency 10/11/2014   Medication management 10/11/2014   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 07/19/2014   ADD (attention deficit disorder)    Essential hypertension    Hyperlipidemia associated with type 2 diabetes mellitus ([place])    Type 2 diabetes mellitus with hyperglycemia ([place])    Varicose vein of leg 01/24/2013    Screening Tests Immunization History  Administered Date(s) Administered   Influenza Inj Mdck Quad With Preservative 01/22/2017, 12/30/2017, 01/16/2019, 01/08/2020   Influenza Split 03/08/2014, 01/17/2015   Influenza, High Dose Seasonal PF 02/02/2022   Influenza, Seasonal, Injecte, Preservative Fre 01/08/2016   Influenza-Unspecified 03/20/2021   PPD Test 12/28/2013   Pneumococcal Polysaccharide-23 04/22/2015   Td 10/21/2005   Tdap 08/03/2013   Health Maintenance  Topic Date Due   COVID-19 Vaccine (1) Never done   Zoster Vaccines- Shingrix (1 of 2) Never done   FOOT EXAM  08/26/2018   Pneumonia Vaccine 62+ Years old (2 - PCV) 04/18/2021   DEXA SCAN  Never done   OPHTHALMOLOGY EXAM  06/13/2022   COLONOSCOPY (Pts 45-24yr Insurance coverage will need to be confirmed)  07/26/2022   Diabetic kidney evaluation - Urine ACR  10/31/2022   HEMOGLOBIN A1C  11/03/2022   Diabetic kidney evaluation - eGFR measurement  05/06/2023   Medicare Annual Wellness (AWV)  05/06/2023   MAMMOGRAM  05/16/2023   DTaP/Tdap/Td (3 - Td or Tdap) 08/04/2023   INFLUENZA VACCINE  Completed   Hepatitis C Screening  Completed   HPV VACCINES  Aged Out      Last colonoscopy: 07/2021 Mammograms: 05/2021 Last pap smear/pelvic exam: 2019 DEXA: Due  Names of Other Physician/Practitioners you currently use: 1. [facility] here for primary care 2. eye doctor, last visit Dr. SGolda Acre3/2022 3. dentist, last visit Dr. GJeananne RamaQ6 mo  Patient Care Team: MUnk Pinto MD as PCP - General  (Internal Medicine) MRichmond Campbell MD as Consulting Physician (Gastroenterology) CJannette Spanner MD as Referring Physician (Dermatology) SRachel Moulds DO (Internal Medicine) WGarald Balding MD (Inactive) as Consulting Physician (Orthopedic Surgery) SRutherford Guys MD as Consulting Physician (Ophthalmology) CStanford BreedBDenice Bors MD as Consulting Physician (Cardiology) GMichael Boston MD as Consulting Physician (General Surgery) Pyrtle, JLajuan Lines MD as Consulting Physician (Gastroenterology)  SURGICAL HISTORY She  has a past surgical history that includes Hernia repair; Cholecystectomy; Tubal ligation; Cosmetic surgery; Rotator cuff repair (Right, 04/06/2010); Eye surgery (04/06/2004); Nissen fundoplication ([id number]; Carpal tunnel release (1998, 2001); Nasal sinus surgery; colonscopy ; Upper gi endoscopy; Partial proctectomy by tem (N/A, 07/19/2014); and Polypectomy. FAMILY HISTORY Her family history includes Breast cancer in her mother and sister; Cancer in her mother and paternal grandmother; Colon cancer (age of onset: 342 in her brother; Colon cancer (age of onset: 566 in her paternal grandfather and another family member; Colon cancer (age of onset: 679 in her father; Colon polyps in her brother, father, paternal grandfather, and another family member; Diabetes in her mother; Hyperlipidemia in her father and mother; Hypertension in her father and mother; Prostate cancer in her father; Skin cancer in her father. SOCIAL HISTORY She  reports that she has never smoked. She has never used smokeless tobacco. She reports that she does not drink alcohol and does not use drugs.   MEDICARE WELLNESS OBJECTIVES: Physical activity:   Cardiac risk factors:   Depression/mood screen:      02/01/2022    9:01 PM  Depression screen PHQ 2/9  Decreased Interest 0  Down, Depressed, Hopeless 0  PHQ - 2 Score 0    ADLs:     05/05/2022   11:40 AM 02/01/2022    9:02 PM  In your present state of  health, do you have any difficulty performing the following activities:  Hearing? 1 0  Vision? 0 0  Difficulty concentrating or making decisions? 0 0  Walking or climbing stairs? 0 0  Dressing  or bathing? 0 0  Doing errands, shopping? 0 0  Preparing Food and eating ? N   Using the Toilet? N   In the past six months, have you accidently leaked urine? N   Do you have problems with loss of bowel control? N   Managing your Medications? N   Managing your Finances? N   Housekeeping or managing your Housekeeping? N      Cognitive Testing  Alert? Yes  Normal Appearance?Yes  Oriented to person? Yes  Place? Yes   Time? Yes  Recall of three objects?  Yes  Can perform simple calculations? Yes  Displays appropriate judgment?Yes  Can read the correct time from a watch face?Yes  EOL planning:    ROS   Objective:     Today's Vitals   05/05/22 1104  BP: 136/80  Pulse: 91  Temp: (!) 97.5 F (36.4 C)  SpO2: 98%  Weight: 164 lb 3.2 oz (74.5 kg)  Height: '5\' 4"'$  (1.626 m)   Body mass index is 28.18 kg/m.  General appearance: alert, no distress, WD/WN, female HEENT: right frontal maxillary sinus with tenderness to palpation, puffy. normocephalic, sclerae anicteric, TMs pearly, nares patent, no discharge or erythema, pharynx normal Oral cavity: MMM, no lesions Neck: supple, no lymphadenopathy, no thyromegaly, no masses Heart: RRR, normal S1, S2, no murmurs Lungs: CTA bilaterally, no wheezes, rhonchi, or rales Abdomen: +bs, soft, non tender, non distended, no masses, no hepatomegaly, no splenomegaly Musculoskeletal: nontender, no swelling, no obvious deformity Extremities: no edema, no cyanosis, no clubbing Pulses: 2+ symmetric, upper and lower extremities, normal cap refill Neurological: alert, oriented x 3, CN2-12 intact, strength normal upper extremities and lower extremities, sensation normal throughout, DTRs 2+ throughout, no cerebellar signs, gait normal Psychiatric: normal  affect, behavior normal, pleasant   Medicare Attestation I have personally reviewed: The patient's medical and social history Their use of alcohol, tobacco or illicit drugs Their current medications and supplements The patient's functional ability including ADLs,fall risks, home safety risks, cognitive, and hearing and visual impairment Diet and physical activities Evidence for depression or mood disorders  The patient's weight, height, BMI, and visual acuity have been recorded in the chart.  I have made referrals, counseling, and provided education to the patient based on review of the above and I have provided the patient with a written personalized care plan for preventive services.     Darrol Jump, NP   05/11/2022

## 2022-05-06 LAB — HEMOGLOBIN A1C
Hgb A1c MFr Bld: 9.7 % of total Hgb — ABNORMAL HIGH (ref <5.7)
Mean Plasma Glucose: 232 mg/dL
eAG (mmol/L): 12.8 mmol/L

## 2022-05-06 LAB — CBC WITH DIFFERENTIAL/PLATELET
Absolute Monocytes: 476 cells/uL (ref 200–950)
Basophils Absolute: 123 cells/uL (ref 0–200)
Basophils Relative: 1.5 %
Eosinophils Absolute: 328 cells/uL (ref 15–500)
Eosinophils Relative: 4 %
HCT: 46.3 % — ABNORMAL HIGH (ref 35.0–45.0)
Hemoglobin: 15 g/dL (ref 11.7–15.5)
Lymphs Abs: 2854 cells/uL (ref 850–3900)
MCH: 27.7 pg (ref 27.0–33.0)
MCHC: 32.4 g/dL (ref 32.0–36.0)
MCV: 85.4 fL (ref 80.0–100.0)
MPV: 11 fL (ref 7.5–12.5)
Monocytes Relative: 5.8 %
Neutro Abs: 4420 cells/uL (ref 1500–7800)
Neutrophils Relative %: 53.9 %
Platelets: 339 10*3/uL (ref 140–400)
RBC: 5.42 10*6/uL — ABNORMAL HIGH (ref 3.80–5.10)
RDW: 12.3 % (ref 11.0–15.0)
Total Lymphocyte: 34.8 %
WBC: 8.2 10*3/uL (ref 3.8–10.8)

## 2022-05-06 LAB — LIPID PANEL
Cholesterol: 117 mg/dL (ref <200)
HDL: 50 mg/dL (ref >=50)
LDL Cholesterol (Calc): 49 mg/dL (calc)
Non-HDL Cholesterol (Calc): 67 mg/dL (calc) (ref <130)
Total CHOL/HDL Ratio: 2.3 (calc) (ref <5.0)
Triglycerides: 98 mg/dL (ref <150)

## 2022-05-06 LAB — COMPLETE METABOLIC PANEL WITH GFR
AG Ratio: 1.4 (calc) (ref 1.0–2.5)
ALT: 22 U/L (ref 6–29)
AST: 18 U/L (ref 10–35)
Albumin: 4.1 g/dL (ref 3.6–5.1)
Alkaline phosphatase (APISO): 104 U/L (ref 37–153)
BUN: 20 mg/dL (ref 7–25)
CO2: 25 mmol/L (ref 20–32)
Calcium: 9.4 mg/dL (ref 8.6–10.4)
Chloride: 104 mmol/L (ref 98–110)
Creat: 0.83 mg/dL (ref 0.50–1.05)
Globulin: 3 g/dL (calc) (ref 1.9–3.7)
Glucose, Bld: 301 mg/dL — ABNORMAL HIGH (ref 65–99)
Potassium: 4.2 mmol/L (ref 3.5–5.3)
Sodium: 138 mmol/L (ref 135–146)
Total Bilirubin: 0.6 mg/dL (ref 0.2–1.2)
Total Protein: 7.1 g/dL (ref 6.1–8.1)
eGFR: 78 mL/min/{1.73_m2} (ref >=60)

## 2022-05-11 NOTE — Patient Instructions (Signed)

## 2022-06-04 LAB — HM MAMMOGRAPHY

## 2022-06-12 ENCOUNTER — Other Ambulatory Visit: Payer: Self-pay | Admitting: Nurse Practitioner

## 2022-06-12 ENCOUNTER — Telehealth: Payer: Self-pay | Admitting: Nurse Practitioner

## 2022-06-12 DIAGNOSIS — E1165 Type 2 diabetes mellitus with hyperglycemia: Secondary | ICD-10-CM

## 2022-06-12 DIAGNOSIS — Z794 Long term (current) use of insulin: Secondary | ICD-10-CM

## 2022-06-12 MED ORDER — "INSULIN SYRINGE-NEEDLE U-100 31G X 5/16"" 1 ML MISC": 3 refills | Status: DC

## 2022-06-12 NOTE — Telephone Encounter (Signed)
[facility] is [facility] [place]

## 2022-06-15 MED ORDER — "INSULIN SYRINGE-NEEDLE U-100 31G X 5/16"" 1 ML MISC": 3 refills | Status: DC

## 2022-06-15 NOTE — Addendum Note (Signed)
Addended by: Chancy Hurter on: 06/15/2022 10:08 AM   Modules accepted: Orders

## 2022-06-16 ENCOUNTER — Encounter: Payer: Self-pay | Admitting: Internal Medicine

## 2022-06-16 LAB — HM DIABETES EYE EXAM

## 2022-06-17 ENCOUNTER — Encounter: Payer: Self-pay | Admitting: Nurse Practitioner

## 2022-06-17 ENCOUNTER — Telehealth: Payer: Self-pay | Admitting: Nurse Practitioner

## 2022-06-17 ENCOUNTER — Encounter: Payer: Self-pay | Admitting: Internal Medicine

## 2022-06-17 ENCOUNTER — Other Ambulatory Visit: Payer: Self-pay | Admitting: Nurse Practitioner

## 2022-06-17 DIAGNOSIS — Z794 Long term (current) use of insulin: Secondary | ICD-10-CM

## 2022-06-17 DIAGNOSIS — E1165 Type 2 diabetes mellitus with hyperglycemia: Secondary | ICD-10-CM

## 2022-06-17 MED ORDER — ONETOUCH VERIO VI STRP: ORAL_STRIP | 3 refills | Status: AC

## 2022-06-17 MED ORDER — "INSULIN SYRINGE-NEEDLE U-100 31G X 5/16"" 1 ML MISC": 3 refills | Status: DC

## 2022-06-17 NOTE — Telephone Encounter (Signed)
Patient is requesting a refill on One Touch Vero Test strips. She also states that rx for syringes that was sent in was supposed to be for a 90 day supply. Can you revise and re-send that? Lastly, she needs refill on Mounjaro but would like to increase to the 10 mg.  [facility] [place]

## 2022-06-22 ENCOUNTER — Other Ambulatory Visit: Payer: Self-pay | Admitting: Nurse Practitioner

## 2022-06-22 DIAGNOSIS — E1165 Type 2 diabetes mellitus with hyperglycemia: Secondary | ICD-10-CM

## 2022-06-22 MED ORDER — MOUNJARO 10 MG/0.5ML ~~LOC~~ SOAJ: 10.0000 mg | SUBCUTANEOUS | 2 refills | Status: DC

## 2022-07-17 ENCOUNTER — Other Ambulatory Visit: Payer: Self-pay | Admitting: Internal Medicine

## 2022-07-24 ENCOUNTER — Telehealth: Payer: Self-pay | Admitting: Nurse Practitioner

## 2022-07-24 NOTE — Telephone Encounter (Signed)
Patient is unable to find a pharmacy with [facility] in stock with the dosage that she is taking. She has been without it for 6 weeks and her sugars are starting to be problematic. Would like to know what she should do? -Also, unable to find pharmacy that has vyvanse in stock and wants to know if it can be sent to her mail order instead?

## 2022-07-27 ENCOUNTER — Other Ambulatory Visit: Payer: Self-pay | Admitting: Nurse Practitioner

## 2022-07-27 DIAGNOSIS — E1165 Type 2 diabetes mellitus with hyperglycemia: Secondary | ICD-10-CM

## 2022-07-27 DIAGNOSIS — F988 Other specified behavioral and emotional disorders with onset usually occurring in childhood and adolescence: Secondary | ICD-10-CM

## 2022-07-27 DIAGNOSIS — R059 Cough, unspecified: Secondary | ICD-10-CM

## 2022-07-27 MED ORDER — IPRATROPIUM BROMIDE 0.06 % NA SOLN
2.0000 | Freq: Three times a day (TID) | NASAL | 2 refills | Status: DC
Start: 2022-07-27 — End: 2022-12-28

## 2022-07-27 MED ORDER — LISDEXAMFETAMINE DIMESYLATE 20 MG PO CAPS
ORAL_CAPSULE | ORAL | 0 refills | Status: DC
Start: 2022-07-27 — End: 2022-07-29

## 2022-07-29 MED ORDER — LISDEXAMFETAMINE DIMESYLATE 20 MG PO CAPS
ORAL_CAPSULE | ORAL | 0 refills | Status: DC
Start: 2022-07-29 — End: 2022-08-07

## 2022-07-29 MED ORDER — MOUNJARO 10 MG/0.5ML ~~LOC~~ SOAJ
10.0000 mg | SUBCUTANEOUS | 2 refills | Status: DC
Start: 2022-07-29 — End: 2022-08-10

## 2022-07-31 ENCOUNTER — Other Ambulatory Visit: Payer: Self-pay | Admitting: Nurse Practitioner

## 2022-07-31 DIAGNOSIS — F419 Anxiety disorder, unspecified: Secondary | ICD-10-CM

## 2022-07-31 DIAGNOSIS — E785 Hyperlipidemia, unspecified: Secondary | ICD-10-CM

## 2022-08-03 ENCOUNTER — Ambulatory Visit: Payer: Medicare HMO | Admitting: Nurse Practitioner

## 2022-08-04 ENCOUNTER — Ambulatory Visit: Payer: Medicare HMO | Admitting: Nurse Practitioner

## 2022-08-06 ENCOUNTER — Telehealth: Payer: Self-pay | Admitting: Nurse Practitioner

## 2022-08-06 ENCOUNTER — Ambulatory Visit: Payer: Medicare HMO | Admitting: Nurse Practitioner

## 2022-08-06 NOTE — Telephone Encounter (Signed)
[facility] called saying vyvanse is on back order

## 2022-08-07 ENCOUNTER — Other Ambulatory Visit: Payer: Self-pay | Admitting: Nurse Practitioner

## 2022-08-07 DIAGNOSIS — F988 Other specified behavioral and emotional disorders with onset usually occurring in childhood and adolescence: Secondary | ICD-10-CM

## 2022-08-07 MED ORDER — LISDEXAMFETAMINE DIMESYLATE 20 MG PO CAPS: ORAL_CAPSULE | ORAL | 0 refills | Status: DC

## 2022-08-10 ENCOUNTER — Ambulatory Visit (AMBULATORY_SURGERY_CENTER): Payer: Medicare HMO

## 2022-08-10 VITALS — Ht 64.0 in | Wt 165.0 lb

## 2022-08-10 DIAGNOSIS — Z86010 Personal history of colonic polyps: Secondary | ICD-10-CM

## 2022-08-10 DIAGNOSIS — Z1509 Genetic susceptibility to other malignant neoplasm: Secondary | ICD-10-CM

## 2022-08-10 DIAGNOSIS — Z800 Family history of malignant neoplasm of digestive organs: Secondary | ICD-10-CM

## 2022-08-10 MED ORDER — NA SULFATE-K SULFATE-MG SULF 17.5-3.13-1.6 GM/177ML PO SOLN: 1.0000 | Freq: Once | ORAL | 0 refills | Status: AC

## 2022-08-10 NOTE — Progress Notes (Signed)
No egg or soy allergy known to patient  No issues known to pt with past sedation with any surgeries or procedures Patient denies ever being told they had issues or difficulty with intubation  No FH of Malignant Hyperthermia Pt is not on diet pills Pt is not on  home 02  Pt is not on blood thinners  Pt denies issues with constipation  No A fib or A flutter Have any cardiac testing pending--no  Pt denies any mobility issues   Patient's chart reviewed by Cathlyn Parsons CNRA prior to previsit and patient appropriate for the [facility].  Previsit completed and red dot placed by patient's name on their procedure day (on provider's schedule).     PV complete. Prep reviewed with patient. Instructions sent via mychart and to address on file.   [facility] [place] Goodrx coupon provided.

## 2022-08-12 ENCOUNTER — Ambulatory Visit (INDEPENDENT_AMBULATORY_CARE_PROVIDER_SITE_OTHER): Payer: Medicare HMO | Admitting: Nurse Practitioner

## 2022-08-12 ENCOUNTER — Encounter: Payer: Self-pay | Admitting: Nurse Practitioner

## 2022-08-12 VITALS — BP 118/72 | HR 76 | Temp 97.8°F | Resp 16 | Ht 64.0 in | Wt 163.2 lb

## 2022-08-12 DIAGNOSIS — I8393 Asymptomatic varicose veins of bilateral lower extremities: Secondary | ICD-10-CM

## 2022-08-12 DIAGNOSIS — N182 Chronic kidney disease, stage 2 (mild): Secondary | ICD-10-CM

## 2022-08-12 DIAGNOSIS — I10 Essential (primary) hypertension: Secondary | ICD-10-CM

## 2022-08-12 DIAGNOSIS — Z794 Long term (current) use of insulin: Secondary | ICD-10-CM

## 2022-08-12 DIAGNOSIS — E559 Vitamin D deficiency, unspecified: Secondary | ICD-10-CM

## 2022-08-12 DIAGNOSIS — E785 Hyperlipidemia, unspecified: Secondary | ICD-10-CM

## 2022-08-12 DIAGNOSIS — E1122 Type 2 diabetes mellitus with diabetic chronic kidney disease: Secondary | ICD-10-CM

## 2022-08-12 DIAGNOSIS — F5101 Primary insomnia: Secondary | ICD-10-CM

## 2022-08-12 DIAGNOSIS — E1140 Type 2 diabetes mellitus with diabetic neuropathy, unspecified: Secondary | ICD-10-CM

## 2022-08-12 DIAGNOSIS — F330 Major depressive disorder, recurrent, mild: Secondary | ICD-10-CM

## 2022-08-12 DIAGNOSIS — D128 Benign neoplasm of rectum: Secondary | ICD-10-CM | POA: Diagnosis not present

## 2022-08-12 DIAGNOSIS — E1165 Type 2 diabetes mellitus with hyperglycemia: Secondary | ICD-10-CM | POA: Diagnosis not present

## 2022-08-12 DIAGNOSIS — E1169 Type 2 diabetes mellitus with other specified complication: Secondary | ICD-10-CM

## 2022-08-12 DIAGNOSIS — F419 Anxiety disorder, unspecified: Secondary | ICD-10-CM

## 2022-08-12 DIAGNOSIS — E663 Overweight: Secondary | ICD-10-CM

## 2022-08-12 DIAGNOSIS — F988 Other specified behavioral and emotional disorders with onset usually occurring in childhood and adolescence: Secondary | ICD-10-CM

## 2022-08-12 DIAGNOSIS — Z1509 Genetic susceptibility to other malignant neoplasm: Secondary | ICD-10-CM

## 2022-08-12 DIAGNOSIS — Z79899 Other long term (current) drug therapy: Secondary | ICD-10-CM

## 2022-08-12 LAB — CBC WITH DIFFERENTIAL/PLATELET
Basophils Relative: 1.4 %
Eosinophils Absolute: 274 cells/uL (ref 15–500)
Eosinophils Relative: 3.7 %
MCH: 27.9 pg (ref 27.0–33.0)
Monocytes Relative: 5.8 %
Neutrophils Relative %: 53.5 %
Total Lymphocyte: 35.6 %
WBC: 7.4 10*3/uL (ref 3.8–10.8)

## 2022-08-12 MED ORDER — BUSPIRONE HCL 5 MG PO TABS
ORAL_TABLET | ORAL | 1 refills | Status: DC
Start: 2022-08-12 — End: 2022-09-03

## 2022-08-12 NOTE — Progress Notes (Signed)
FOLLOW UP  Assessment:   Essential hypertension Discussed DASH (Dietary Approaches to Stop Hypertension) DASH diet is lower in sodium than a typical American diet. Cut back on foods that are high in saturated fat, cholesterol, and trans fats. Eat more whole-grain foods, fish, poultry, and nuts Remain active and exercise as tolerated daily.  Monitor BP at home-Call if greater than 130/80.  Check CMP/CBC  Varicose veins of both lower extremities, unspecified whether complicated Continue to monitor.  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  Type 2 diabetes mellitus with hyperglycemia, with long-term current use of insulin [facility]) Education: Reviewed 'ABCs' of diabetes management  Discussed goals to be met and/or maintained include A1C (<7) Blood pressure (<130/80) Cholesterol (LDL <70) Continue Eye Exam yearly  Continue Dental Exam Q6 mo Discussed dietary recommendations Discussed Physical Activity recommendations Check A1C   Hyperlipidemia associated with type 2 diabetes mellitus (HCC) Discussed lifestyle modifications. Recommended diet heavy in fruits and veggies, omega 3's. Decrease consumption of animal meats, cheeses, and dairy products. Remain active and exercise as tolerated. Continue to monitor. Check lipids/TSH  CKD stage 2 due to type 2 diabetes mellitus (HCC) Discussed how what you eat and drink can aide in kidney protection. Stay well hydrated. Avoid high salt foods. Avoid NSAIDS. Keep BP and BG well controlled.   Take medications as prescribed. Remain active and exercise as tolerated daily. Maintain weight.  Continue to monitor.  Neuropathy due to type 2 diabetes mellitus (HCC) Continue Gabapentin  Mild episode of recurrent major depressive disorder (HCC) Controlled Continue Escitalopram 20 mg. Alprazolam PRN  MSH2-related Lynch syndrome (HNPCC1) Continue to monitor.   Medication management All  medication reviewed an discussed. All questions and concerns addressed.  Vitamin D deficiency Continue Cholecalciferol  Overweight (BMI 25.0-29.9) Discussed appropriate BMI Diet modification. Physical activity. Encouraged/praised to build confidence.   Attention deficit disorder, unspecified hyperactivity presence Continue Vyvanse.  Medications providing over 30% improvement of attention deficit hyperactivity disorder. No change in management. Using lowest effective dose. Discussed alternative pharmacological and non-pharmacological therapies. No suspected aberrant drug-taking behaviors. 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   Discussed good sleep hygiene. Establish bed and wake times. Sleep restriction-only sleep estimated hrs sleep. Bed only for sex and sleep, only sleep when sleepy, out of bed if anxious (stimulus control). Reviewed relaxation techniques, mindful meditations. Expected sleep duration. Addressed worries about not sleeping.   Orders Placed This Encounter  Procedures   CBC with Differential/Platelet   COMPLETE METABOLIC PANEL WITH GFR   Lipid panel   Hemoglobin A1c   VITAMIN D 25 Hydroxy (Vit-D Deficiency, Fractures)   Meds ordered this encounter  Medications   busPIRone (BUSPAR) 5 MG tablet    Sig: Take     1/2 to 1 tablet     3 x /day      for Anxiety    Dispense:  90 tablet    Refill:  1    Order Specific Question:   Supervising Provider    Answer:   Lucky Cowboy [phone]   Notify office for further evaluation and treatment, questions or concerns if any reported s/s fail to improve.   The patient was advised to call back or seek an in-person evaluation if any symptoms worsen or if the condition fails to improve as anticipated.   Further disposition pending results of labs. Discussed med's effects and SE's.    I discussed the  assessment and treatment plan with the  patient. The patient was provided an opportunity to ask questions and all were answered. The patient agreed with the plan and demonstrated an understanding of the instructions.  Discussed med's effects and SE's. Screening labs and tests as requested with regular follow-up as recommended.  I provided 25 minutes of face-to-face time during this encounter including counseling, chart review, and critical decision making was preformed.  Today's Plan of Care is based on a patient-centered health care approach known as shared decision making - the decisions, tests and treatments allow for patient preferences and values to be balanced with clinical evidence.     Future Appointments  Date Time Provider Department Center  09/07/2022  9:00 AM Pyrtle, Carie Caddy, MD [facility]  11/10/2022 10:00 AM Raynelle Dick, NP [facility] None  05/06/2023 11:00 AM Adela Glimpse, NP [facility] None     Plan:   During the course of the visit the patient was educated and counseled about appropriate screening and preventive services including:   Pneumococcal vaccine  Prevnar 13 Influenza vaccine Td vaccine Screening electrocardiogram Bone densitometry screening Colorectal cancer screening Diabetes screening Glaucoma screening Nutrition counseling  Advanced directives: requested   Subjective:  Sydney Dickson is a 67 y.o. female who presents for a general follow up. 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  Overall she reports feeling well.    She has had trouble sleeping.  States this has been going on since retirement several years ago.  She is unable to fall asleep at night d/t racing thoughts.  Her bedtime is usually between 2300-2400 and she does not fall asleep at times until 0300.  Her apply iWatch records an average 3-5 hours of sleep.    Has depression that is controlled with Lexapro and Xanax PRN.  Also takes Vyvanse for ADHD.  Reports medications are currently effective.  BMI is Body mass index is 28.01 kg/m., she has been working on diet and exercise. Wt Readings from Last 3 Encounters:  08/12/22 163 lb 3.2 oz (74 kg)  08/10/22 165 lb (74.8 kg)  05/05/22 164 lb 3.2 oz (74.5 kg)    Her blood pressure has been controlled at home, today their BP is BP: 118/72 She does not workout. She denies chest pain, shortness of breath, dizziness.  She is on cholesterol medication and denies myalgias. Her cholesterol is not at goal. The cholesterol last visit was:   Lab Results  Component Value Date   CHOL 117 05/05/2022   HDL 50 05/05/2022   LDLCALC 49 05/05/2022   TRIG 98 05/05/2022   CHOLHDL 2.3 05/05/2022   She has been working on diet and exercise for 3 months for prediabetes, and denies foot ulcerations, hyperglycemia, and hypoglycemia .  Her A1c is not controlled.  She continues Novilin 70/30.  Sugars have been averaging 250-300 at times.  She was on Tirzepatide 7.5 mg but has been able to get for the last 3 months d/t stocking  issues.  She has now re-started her first injection yesterday.  She has some nausea.  Endorses occasional neuropath, however, currently controlled. Last A1C in the office was:  Lab Results  Component Value Date   HGBA1C 9.7 (H) 05/05/2022   Last GFR: Lab Results  Component Value Date   EGFR 78 05/05/2022   Patient  is on Vitamin D supplement.   Lab Results  Component Value Date   VD25OH 43 02/02/2022      Medication Review: Current Outpatient Medications on File Prior to Visit  Medication Sig Dispense Refill   acetaminophen (TYLENOL) 500 MG tablet Take 1,000 mg by mouth daily as needed for moderate pain or headache.     ALPRAZolam (XANAX) 0.5 MG tablet TAKE 1 TABLET BY MOUTH 3 TIMES DAILY AS NEEDED. 60 tablet 0   aspirin 81 MG tablet Take 81 mg by mouth daily.     CHOLECALCIFEROL PO Take 5,000 Units by mouth daily.     Cyanocobalamin (B-12 PO) Take by mouth.     escitalopram (LEXAPRO) 20 MG tablet TAKE 1 TABLET BY MOUTH DAILY FOR MOOD 90 tablet 1   fluticasone (FLONASE) 50 MCG/ACT nasal spray Place 2 sprays into both nostrils daily. 48 g 1   glucose blood (ONETOUCH VERIO) test strip Use as instructedCHECK BLOOD SUGAR 3 TIMES DAILY-DX-E11.65 300 strip 3   Insulin Syringe-Needle U-100 (BD INSULIN SYRINGE U/F) 31G X 5/16" 1 ML MISC USE AS DIRECTED TWICE A DAY 300 each 3   ipratropium (ATROVENT) 0.06 % nasal spray Place 2 sprays into the nose 3 (three) times daily. 15 mL 2   Lancets (ONETOUCH ULTRASOFT) lancets Use 3 lancets daily to check blood sugar-DX-E11.65 300 each 1   levocetirizine (XYZAL) 5 MG tablet TAKE 1 TAB BY MOUTH DAILY FOR ALLERGIES 90 tablet 3   lisdexamfetamine (VYVANSE) 20 MG capsule Take 1 tab daily in the morning as needed, avoid taking daily due to risk for tolerance. 30 tabs should last longer than a month. 90 capsule 0   losartan (COZAAR) 100 MG tablet TAKE 1 TABLET DAILY FOR BP & DIABETIC KIDNEY PROTECTION 90 tablet 3   MOUNJARO 7.5 MG/0.5ML Pen Inject 7.5 mg into the skin once a week.     NOVOLIN 70/30 (70-30) 100 UNIT/ML injection INJECT 25 UNITS SUBCUTANEOUSLY IN THE MORNING AND 20 UNITS AT DINNER. 40 mL 2   rosuvastatin (CRESTOR) 20 MG tablet TAKE 1 TABLET BY MOUTH EVERY DAY FOR CHOLESTEROL 90 tablet 3   tiZANidine (ZANAFLEX) 2 MG tablet TAKE 1 TABLET (2 MG TOTAL) BY MOUTH  EVERY 6 (SIX) HOURS AS NEEDED FOR MUSCLE SPASMS. (Patient not taking: Reported on 08/10/2022) 60 tablet 0   No current facility-administered medications on file prior to visit.    Allergies  Allergen Reactions   Tape Rash   Ace Inhibitors Cough   Metformin And Related     myalgia   Morphine And Related Itching   Penicillins     Pt states that in 3rd grade she was given an injection in the buttock and reacted with a large hive in that area Pt can take oral penicillin with no reaction     Wellbutrin [Bupropion]     Felt funny    Current Problems (verified) Patient Active Problem List   Diagnosis Date Noted   Diabetes mellitus (HCC) 10/23/2021   Neuropathy due to type 2 diabetes mellitus (HCC) 04/24/2021   Mild episode of recurrent major depressive disorder (HCC) 04/24/2021   CKD stage 2  due to type 2 diabetes mellitus (HCC) 04/23/2021   Nondisplaced fracture of lateral malleolus of left fibula, initial encounter for closed fracture 09/27/2018   MSH2-related Lynch syndrome (HNPCC1) 03/18/2017   Overweight (BMI 25.0-29.9) 10/11/2014   Vitamin D deficiency 10/11/2014   Medication management 10/11/2014   Obesity 10/11/2014   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 07/19/2014   ADD (attention deficit disorder)    Essential hypertension    Hyperlipidemia associated with type 2 diabetes mellitus (HCC)    Type 2 diabetes mellitus with hyperglycemia (HCC)    Varicose vein of leg 01/24/2013    Patient Care Team: Lucky Cowboy, MD as PCP - General (Internal Medicine) Sharrell Ku, MD as Consulting Physician (Gastroenterology) Nils Flack, MD as Referring Physician (Dermatology) Marisue Brooklyn, DO (Internal Medicine) Valeria Batman, MD (Inactive) as Consulting Physician (Orthopedic Surgery) Jethro Bolus, MD as Consulting Physician (Ophthalmology) Jens Som Madolyn Frieze, MD as Consulting Physician (Cardiology) Karie Soda, MD as Consulting  Physician (General Surgery) Pyrtle, Carie Caddy, MD as Consulting Physician (Gastroenterology)  SURGICAL HISTORY She  has a past surgical history that includes Hernia repair; Cholecystectomy; Tubal ligation; Cosmetic surgery; Rotator cuff repair (Right, 04/06/2010); Eye surgery (04/06/2004); Nissen fundoplication (04/07/1999); Carpal tunnel release (1998, 2001); Nasal sinus surgery; colonscopy ; Upper gi endoscopy; Partial proctectomy by tem (N/A, 07/19/2014); and Polypectomy. FAMILY HISTORY Her family history includes Breast cancer in her mother and sister; Cancer in her mother and paternal grandmother; Colon cancer (age of onset: 52) in her brother; Colon cancer (age of onset: 58) in her paternal grandfather and another family member; Colon cancer (age of onset: 94) in her father; Colon polyps in her brother, father, paternal grandfather, and another family member; Diabetes in her mother; Hyperlipidemia in her father and mother; Hypertension in her father and mother; Prostate cancer in her father; Skin cancer in her father. SOCIAL HISTORY She  reports that she has never smoked. She has never used smokeless tobacco. She reports that she does not drink alcohol and does not use drugs.     Review of Systems  Constitutional: Negative.   HENT: Negative.    Eyes: Negative.   Respiratory: Negative.    Cardiovascular: Negative.   Gastrointestinal: Negative.   Genitourinary: Negative.   Musculoskeletal: Negative.   Skin: Negative.   Neurological: Negative.   Endo/Heme/Allergies: Negative.   Psychiatric/Behavioral:  The patient has insomnia.     Objective:     Today's Vitals   08/12/22 1045  BP: 118/72  Pulse: 76  Resp: 16  Temp: 97.8 F (36.6 C)  SpO2: 99%  Weight: 163 lb 3.2 oz (74 kg)  Height: 5\' 4"  (1.626 m)   Body mass index is 28.01 kg/m.  General appearance: alert, no distress, WD/WN, female HEENT: right frontal maxillary sinus with tenderness to palpation, puffy. normocephalic,  sclerae anicteric, TMs pearly, nares patent, no discharge or erythema, pharynx normal Oral cavity: MMM, no lesions Neck: supple, no lymphadenopathy, no thyromegaly, no masses Heart: RRR, normal S1, S2, no murmurs Lungs: CTA bilaterally, no wheezes, rhonchi, or rales Abdomen: +bs, soft, non tender, non distended, no masses, no hepatomegaly, no splenomegaly Musculoskeletal: nontender, no swelling, no obvious deformity Extremities: no edema, no cyanosis, no clubbing Pulses: 2+ symmetric, upper and lower extremities, normal cap refill Neurological: alert, oriented x 3, CN2-12 intact, strength normal upper extremities and lower extremities, sensation normal throughout, DTRs 2+ throughout, no cerebellar signs, gait normal Psychiatric: normal affect, behavior normal, pleasant    Adela Glimpse, NP  08/12/2022    

## 2022-08-12 NOTE — Patient Instructions (Signed)
Insomnia Insomnia is a sleep disorder that makes it difficult to fall asleep or stay asleep. Insomnia can cause fatigue, low energy, difficulty concentrating, mood swings, and poor performance at work or school. There are three different ways to classify insomnia: Difficulty falling asleep. Difficulty staying asleep. Waking up too early in the morning. Any type of insomnia can be long-term (chronic) or short-term (acute). Both are common. Short-term insomnia usually lasts for 3 months or less. Chronic insomnia occurs at least three times a week for longer than 3 months. What are the causes? Insomnia may be caused by another condition, situation, or substance, such as: Having certain mental health conditions, such as anxiety and depression. Using caffeine, alcohol, tobacco, or drugs. Having gastrointestinal conditions, such as gastroesophageal reflux disease (GERD). Having certain medical conditions. These include: Asthma. Alzheimer's disease. Stroke. Chronic pain. An overactive thyroid gland (hyperthyroidism). Other sleep disorders, such as restless legs syndrome and sleep apnea. Menopause. Sometimes, the cause of insomnia may not be known. What increases the risk? Risk factors for insomnia include: Gender. Females are affected more often than males. Age. Insomnia is more common as people get older. Stress and certain medical and mental health conditions. Lack of exercise. Having an irregular work schedule. This may include working night shifts and traveling between different time zones. What are the signs or symptoms? If you have insomnia, the main symptom is having trouble falling asleep or having trouble staying asleep. This may lead to other symptoms, such as: Feeling tired or having low energy. Feeling nervous about going to sleep. Not feeling rested in the morning. Having trouble concentrating. Feeling irritable, anxious, or depressed. How is this diagnosed? This condition  may be diagnosed based on: Your symptoms and medical history. Your health care provider may ask about: Your sleep habits. Any medical conditions you have. Your mental health. A physical exam. How is this treated? Treatment for insomnia depends on the cause. Treatment may focus on treating an underlying condition that is causing the insomnia. Treatment may also include: Medicines to help you sleep. Counseling or therapy. Lifestyle adjustments to help you sleep better. Follow these instructions at home: Eating and drinking  Limit or avoid alcohol, caffeinated beverages, and products that contain nicotine and tobacco, especially close to bedtime. These can disrupt your sleep. Do not eat a large meal or eat spicy foods right before bedtime. This can lead to digestive discomfort that can make it hard for you to sleep. Sleep habits  Keep a sleep diary to help you and your health care provider figure out what could be causing your insomnia. Write down: When you sleep. When you wake up during the night. How well you sleep and how rested you feel the next day. Any side effects of medicines you are taking. What you eat and drink. Make your bedroom a dark, comfortable place where it is easy to fall asleep. Put up shades or blackout curtains to block light from outside. Use a white noise machine to block noise. Keep the temperature cool. Limit screen use before bedtime. This includes: Not watching TV. Not using your smartphone, tablet, or computer. Stick to a routine that includes going to bed and waking up at the same times every day and night. This can help you fall asleep faster. Consider making a quiet activity, such as reading, part of your nighttime routine. Try to avoid taking naps during the day so that you sleep better at night. Get out of bed if you are still awake after   15 minutes of trying to sleep. Keep the lights down, but try reading or doing a quiet activity. When you feel  sleepy, go back to bed. General instructions Take over-the-counter and prescription medicines only as told by your health care provider. Exercise regularly as told by your health care provider. However, avoid exercising in the hours right before bedtime. Use relaxation techniques to manage stress. Ask your health care provider to suggest some techniques that may work well for you. These may include: Breathing exercises. Routines to release muscle tension. Visualizing peaceful scenes. Make sure that you drive carefully. Do not drive if you feel very sleepy. Keep all follow-up visits. This is important. Contact a health care provider if: You are tired throughout the day. You have trouble in your daily routine due to sleepiness. You continue to have sleep problems, or your sleep problems get worse. Get help right away if: You have thoughts about hurting yourself or someone else. Get help right away if you feel like you may hurt yourself or others, or have thoughts about taking your own life. Go to your nearest emergency room or: Call 911. Call the National Suicide Prevention Lifeline at 1-800-273-8255 or 988. This is open 24 hours a day. Text the Crisis Text Line at 741741. Summary Insomnia is a sleep disorder that makes it difficult to fall asleep or stay asleep. Insomnia can be long-term (chronic) or short-term (acute). Treatment for insomnia depends on the cause. Treatment may focus on treating an underlying condition that is causing the insomnia. Keep a sleep diary to help you and your health care provider figure out what could be causing your insomnia. This information is not intended to replace advice given to you by your health care provider. Make sure you discuss any questions you have with your health care provider. Document Revised: 03/03/2021 Document Reviewed: 03/03/2021 [employer] Patient Education  2023 [employer]  

## 2022-08-13 ENCOUNTER — Other Ambulatory Visit: Payer: Self-pay

## 2022-08-13 LAB — LIPID PANEL
Cholesterol: 121 mg/dL (ref <200)
HDL: 53 mg/dL (ref >=50)
LDL Cholesterol (Calc): 50 mg/dL (calc)
Non-HDL Cholesterol (Calc): 68 mg/dL (calc) (ref <130)
Total CHOL/HDL Ratio: 2.3 (calc) (ref <5.0)
Triglycerides: 98 mg/dL (ref <150)

## 2022-08-13 LAB — COMPLETE METABOLIC PANEL WITH GFR
AG Ratio: 1.3 (calc) (ref 1.0–2.5)
ALT: 13 U/L (ref 6–29)
AST: 12 U/L (ref 10–35)
Albumin: 3.9 g/dL (ref 3.6–5.1)
Alkaline phosphatase (APISO): 98 U/L (ref 37–153)
BUN: 14 mg/dL (ref 7–25)
CO2: 26 mmol/L (ref 20–32)
Calcium: 9.9 mg/dL (ref 8.6–10.4)
Chloride: 103 mmol/L (ref 98–110)
Creat: 0.8 mg/dL (ref 0.50–1.05)
Globulin: 2.9 g/dL (calc) (ref 1.9–3.7)
Glucose, Bld: 231 mg/dL — ABNORMAL HIGH (ref 65–99)
Potassium: 4.2 mmol/L (ref 3.5–5.3)
Sodium: 139 mmol/L (ref 135–146)
Total Bilirubin: 0.7 mg/dL (ref 0.2–1.2)
Total Protein: 6.8 g/dL (ref 6.1–8.1)
eGFR: 81 mL/min/{1.73_m2} (ref >=60)

## 2022-08-13 LAB — CBC WITH DIFFERENTIAL/PLATELET
Absolute Monocytes: 429 cells/uL (ref 200–950)
Basophils Absolute: 104 cells/uL (ref 0–200)
HCT: 45 % (ref 35.0–45.0)
Hemoglobin: 14.6 g/dL (ref 11.7–15.5)
Lymphs Abs: 2634 cells/uL (ref 850–3900)
MCHC: 32.4 g/dL (ref 32.0–36.0)
MCV: 85.9 fL (ref 80.0–100.0)
MPV: 12 fL (ref 7.5–12.5)
Neutro Abs: 3959 cells/uL (ref 1500–7800)
Platelets: 305 10*3/uL (ref 140–400)
RBC: 5.24 10*6/uL — ABNORMAL HIGH (ref 3.80–5.10)
RDW: 12.5 % (ref 11.0–15.0)

## 2022-08-13 LAB — VITAMIN D 25 HYDROXY (VIT D DEFICIENCY, FRACTURES): Vit D, 25-Hydroxy: 53 ng/mL (ref 30–100)

## 2022-08-13 LAB — HEMOGLOBIN A1C
Hgb A1c MFr Bld: 11 % of total Hgb — ABNORMAL HIGH (ref <5.7)
Mean Plasma Glucose: 269 mg/dL
eAG (mmol/L): 14.9 mmol/L

## 2022-08-13 MED ORDER — ONDANSETRON HCL 4 MG PO TABS: ORAL_TABLET | ORAL | 2 refills | Status: DC

## 2022-08-17 ENCOUNTER — Other Ambulatory Visit: Payer: Self-pay | Admitting: Nurse Practitioner

## 2022-08-17 DIAGNOSIS — F988 Other specified behavioral and emotional disorders with onset usually occurring in childhood and adolescence: Secondary | ICD-10-CM

## 2022-08-18 ENCOUNTER — Other Ambulatory Visit: Payer: Self-pay | Admitting: Nurse Practitioner

## 2022-08-18 ENCOUNTER — Other Ambulatory Visit: Payer: Self-pay | Admitting: Internal Medicine

## 2022-08-18 DIAGNOSIS — F988 Other specified behavioral and emotional disorders with onset usually occurring in childhood and adolescence: Secondary | ICD-10-CM

## 2022-08-19 MED ORDER — LISDEXAMFETAMINE DIMESYLATE 20 MG PO CAPS
ORAL_CAPSULE | ORAL | 0 refills | Status: DC
Start: 2022-08-19 — End: 2022-09-20

## 2022-08-19 MED ORDER — MOUNJARO 7.5 MG/0.5ML ~~LOC~~ SOAJ: 7.5000 mg | SUBCUTANEOUS | 3 refills | Status: DC

## 2022-08-30 ENCOUNTER — Other Ambulatory Visit: Payer: Self-pay | Admitting: Nurse Practitioner

## 2022-09-03 ENCOUNTER — Other Ambulatory Visit: Payer: Self-pay | Admitting: Nurse Practitioner

## 2022-09-03 DIAGNOSIS — F419 Anxiety disorder, unspecified: Secondary | ICD-10-CM

## 2022-09-03 DIAGNOSIS — F5101 Primary insomnia: Secondary | ICD-10-CM

## 2022-09-07 ENCOUNTER — Encounter: Payer: Self-pay | Admitting: Internal Medicine

## 2022-09-07 ENCOUNTER — Ambulatory Visit (AMBULATORY_SURGERY_CENTER): Payer: Medicare HMO | Admitting: Internal Medicine

## 2022-09-07 VITALS — BP 127/73 | HR 64 | Temp 97.8°F | Resp 14 | Ht 64.0 in | Wt 165.0 lb

## 2022-09-07 DIAGNOSIS — D120 Benign neoplasm of cecum: Secondary | ICD-10-CM

## 2022-09-07 DIAGNOSIS — D122 Benign neoplasm of ascending colon: Secondary | ICD-10-CM

## 2022-09-07 DIAGNOSIS — Z1509 Genetic susceptibility to other malignant neoplasm: Secondary | ICD-10-CM

## 2022-09-07 DIAGNOSIS — Z86010 Personal history of colonic polyps: Secondary | ICD-10-CM

## 2022-09-07 DIAGNOSIS — Z800 Family history of malignant neoplasm of digestive organs: Secondary | ICD-10-CM

## 2022-09-07 DIAGNOSIS — Z09 Encounter for follow-up examination after completed treatment for conditions other than malignant neoplasm: Secondary | ICD-10-CM | POA: Diagnosis present

## 2022-09-07 DIAGNOSIS — K635 Polyp of colon: Secondary | ICD-10-CM | POA: Diagnosis not present

## 2022-09-07 MED ORDER — SODIUM CHLORIDE 0.9 % IV SOLN
500.0000 mL | Freq: Once | INTRAVENOUS | Status: DC
Start: 2022-09-07 — End: 2022-09-07

## 2022-09-07 NOTE — Progress Notes (Signed)
GASTROENTEROLOGY PROCEDURE H&P NOTE   Primary Care Physician: Lucky Cowboy, MD    Reason for Procedure:   Lynch syndrome  Plan:    colonoscopy  Patient is appropriate for endoscopic procedure(s) in the ambulatory ([facility]) setting.  The nature of the procedure, as well as the risks, benefits, and alternatives were carefully and thoroughly reviewed with the patient. Ample time for discussion and questions allowed. The patient understood, was satisfied, and agreed to proceed.     HPI: Sydney Dickson is a 67 y.o. female who presents for colonoscopy.  Medical history as below.  Tolerated the prep.  No recent chest pain or shortness of breath.  No abdominal pain today.  Past Medical History:  Diagnosis Date   ADD (attention deficit disorder)    Allergy    Anxiety    Arthritis    Asthma    with severe allergies pt does use albuterol inhaler when needed   Cataract    removed both eyes   Depression    Diabetes mellitus without complication (HCC) 2010   TYPE 2    GERD (gastroesophageal reflux disease)    hx of had Nissen Fundoplication 2001 has not had any issues since   History of hiatal hernia    Hyperlipidemia 2004   Hypertension    IBS (irritable bowel syndrome)    Lynch syndrome    Neuromuscular disorder (HCC)    Fibromyalgia   Tubular adenoma of colon     Past Surgical History:  Procedure Laterality Date   CARPAL TUNNEL RELEASE  1998, 2001   CHOLECYSTECTOMY     colonscopy      COSMETIC SURGERY     rhinoplasty   EYE SURGERY  04/06/2004   lasik   HERNIA REPAIR     NASAL SINUS SURGERY     NISSEN FUNDOPLICATION  04/07/1999   PARTIAL PROCTECTOMY BY TEM N/A 07/19/2014   Procedure: PARTIAL PROCTECTOMY BY TEM;  Surgeon: Karie Soda, MD;  Location: [facility];  Service: General;  Laterality: N/A;   POLYPECTOMY     ROTATOR CUFF REPAIR Right 04/06/2010   TUBAL LIGATION     UPPER GI ENDOSCOPY      Prior to Admission medications   Medication Sig Start Date End Date  Taking? Authorizing Provider  busPIRone (BUSPAR) 5 MG tablet Take     1/2 to 1 tablet     3 x / day      for Anxiety (Dx:  f33.0) 09/03/22  Yes Lucky Cowboy, MD  CHOLECALCIFEROL PO Take 5,000 Units by mouth daily.   Yes [provider]  Cyanocobalamin (B-12 PO) Take by mouth.   Yes [provider]  escitalopram (LEXAPRO) 20 MG tablet TAKE 1 TABLET BY MOUTH DAILY FOR MOOD 04/17/22  Yes Cranford, Tonya, NP  glucose blood (ONETOUCH VERIO) test strip Use as instructedCHECK BLOOD SUGAR 3 TIMES DAILY-DX-E11.65 06/17/22  Yes Cranford, Archie Patten, NP  Insulin Syringe-Needle U-100 (BD INSULIN SYRINGE U/F) 31G X 5/16" 1 ML MISC USE AS DIRECTED TWICE A DAY 06/17/22  Yes Cranford, Archie Patten, NP  Lancets (ONETOUCH ULTRASOFT) lancets Use 3 lancets daily to check blood sugar-DX-E11.65 04/29/20  Yes Lucky Cowboy, MD  levocetirizine (XYZAL) 5 MG tablet TAKE 1 TAB BY MOUTH DAILY FOR ALLERGIES 07/17/22  Yes Raynelle Dick, NP  lisdexamfetamine (VYVANSE) 20 MG capsule Take 1 tab daily in the morning as needed, avoid taking daily due to risk for tolerance. 30 tabs should last longer than a month. 08/19/22  Yes Aundria Rud,  Rosalva Ferron, NP  losartan (COZAAR) 100 MG tablet TAKE 1 TABLET DAILY FOR BP & DIABETIC KIDNEY PROTECTION 09/16/21  Yes Judd Gaudier, NP  Na Sulfate-K Sulfate-Mg Sulf 17.5-3.13-1.6 GM/177ML SOLN See Admin Instructions. PLEASE SEE ATTACHED FOR DETAILED DIRECTIONS 08/10/22  Yes [provider]  rosuvastatin (CRESTOR) 20 MG tablet TAKE 1 TABLET BY MOUTH EVERY DAY FOR CHOLESTEROL 07/31/22  Yes Raynelle Dick, NP  acetaminophen (TYLENOL) 500 MG tablet Take 1,000 mg by mouth daily as needed for moderate pain or headache.    [provider]  ALPRAZolam Prudy Feeler) 0.5 MG tablet TAKE 1 TABLET BY MOUTH 3 TIMES DAILY AS NEEDED. 07/31/22   Raynelle Dick, NP  aspirin 81 MG tablet Take 81 mg by mouth daily.    [provider]  fluticasone (FLONASE) 50 MCG/ACT nasal spray Place 2  sprays into both nostrils daily. 03/21/18   Judd Gaudier, NP  ipratropium (ATROVENT) 0.06 % nasal spray Place 2 sprays into the nose 3 (three) times daily. 07/27/22 07/27/23  Raynelle Dick, NP  MOUNJARO 7.5 MG/0.5ML Pen Inject 7.5 mg into the skin once a week. 08/19/22   Raynelle Dick, NP  NOVOLIN 70/30 (70-30) 100 UNIT/ML injection INJECT 25 UNITS SUBCUTANEOUSLY IN THE MORNING AND 20 UNITS AT DINNER. 12/21/21   Lucky Cowboy, MD  ondansetron (ZOFRAN) 4 MG tablet Take one tablet every 6-8 hours as needed for nausea. 08/13/22   Adela Glimpse, NP    Current Outpatient Medications  Medication Sig Dispense Refill   busPIRone (BUSPAR) 5 MG tablet Take     1/2 to 1 tablet     3 x / day      for Anxiety (Dx:  f33.0) 270 tablet 3   CHOLECALCIFEROL PO Take 5,000 Units by mouth daily.     Cyanocobalamin (B-12 PO) Take by mouth.     escitalopram (LEXAPRO) 20 MG tablet TAKE 1 TABLET BY MOUTH DAILY FOR MOOD 90 tablet 1   glucose blood (ONETOUCH VERIO) test strip Use as instructedCHECK BLOOD SUGAR 3 TIMES DAILY-DX-E11.65 300 strip 3   Insulin Syringe-Needle U-100 (BD INSULIN SYRINGE U/F) 31G X 5/16" 1 ML MISC USE AS DIRECTED TWICE A DAY 300 each 3   Lancets (ONETOUCH ULTRASOFT) lancets Use 3 lancets daily to check blood sugar-DX-E11.65 300 each 1   levocetirizine (XYZAL) 5 MG tablet TAKE 1 TAB BY MOUTH DAILY FOR ALLERGIES 90 tablet 3   lisdexamfetamine (VYVANSE) 20 MG capsule Take 1 tab daily in the morning as needed, avoid taking daily due to risk for tolerance. 30 tabs should last longer than a month. 90 capsule 0   losartan (COZAAR) 100 MG tablet TAKE 1 TABLET DAILY FOR BP & DIABETIC KIDNEY PROTECTION 90 tablet 3   Na Sulfate-K Sulfate-Mg Sulf 17.5-3.13-1.6 GM/177ML SOLN See Admin Instructions. PLEASE SEE ATTACHED FOR DETAILED DIRECTIONS     rosuvastatin (CRESTOR) 20 MG tablet TAKE 1 TABLET BY MOUTH EVERY DAY FOR CHOLESTEROL 90 tablet 3   acetaminophen (TYLENOL) 500 MG tablet Take 1,000 mg by  mouth daily as needed for moderate pain or headache.     ALPRAZolam (XANAX) 0.5 MG tablet TAKE 1 TABLET BY MOUTH 3 TIMES DAILY AS NEEDED. 60 tablet 0   aspirin 81 MG tablet Take 81 mg by mouth daily.     fluticasone (FLONASE) 50 MCG/ACT nasal spray Place 2 sprays into both nostrils daily. 48 g 1   ipratropium (ATROVENT) 0.06 % nasal spray Place 2 sprays into the nose 3 (three) times  daily. 15 mL 2   MOUNJARO 7.5 MG/0.5ML Pen Inject 7.5 mg into the skin once a week. 2 mL 3   NOVOLIN 70/30 (70-30) 100 UNIT/ML injection INJECT 25 UNITS SUBCUTANEOUSLY IN THE MORNING AND 20 UNITS AT DINNER. 40 mL 2   ondansetron (ZOFRAN) 4 MG tablet Take one tablet every 6-8 hours as needed for nausea. 60 tablet 2   Current Facility-Administered Medications  Medication Dose Route Frequency Provider Last Rate Last Admin   0.9 %  sodium chloride infusion  500 mL Intravenous Once Donnia Poplaski, Carie Caddy, MD        Allergies as of 09/07/2022 - Review Complete 09/07/2022  Allergen Reaction Noted   Tape Rash 07/12/2014   Ace inhibitors Cough 02/18/2015   Metformin and related  08/03/2013   Morphine and codeine Itching 04/27/2013   Penicillins  01/24/2013   Wellbutrin [bupropion]  04/27/2013    Family History  Problem Relation Age of Onset   Cancer Mother        breast   Hyperlipidemia Mother    Hypertension Mother    Diabetes Mother    Breast cancer Mother    Colon polyps Father    Hyperlipidemia Father    Hypertension Father    Colon cancer Father 47   Skin cancer Father    Prostate cancer Father    Breast cancer Sister    Colon polyps Brother    Colon cancer Brother 39       and again at 18   Cancer Paternal Grandmother        cervical and ? uterine   Colon polyps Paternal Grandfather    Colon cancer Paternal Grandfather 69   Colon polyps Other    Colon cancer Other 50   Stomach cancer Neg Hx    Esophageal cancer Neg Hx    Rectal cancer Neg Hx     Social History   Socioeconomic History   Marital  status: Married    Spouse name: Not on file   Number of children: Not on file   Years of education: Not on file   Highest education level: Not on file  Occupational History   Not on file  Tobacco Use   Smoking status: Never   Smokeless tobacco: Never  Vaping Use   Vaping Use: Never used  Substance and Sexual Activity   Alcohol use: No   Drug use: No   Sexual activity: Not on file  Other Topics Concern   Not on file  Social History Narrative   Not on file   Social Determinants of Health   Financial Resource Strain: Not on file  Food Insecurity: Not on file  Transportation Needs: Not on file  Physical Activity: Not on file  Stress: Not on file  Social Connections: Not on file  Intimate Partner Violence: Not on file    Physical Exam: Vital signs in last 24 hours: @BP  122/78   Pulse 68   Temp 97.8 F (36.6 C) (Temporal)   Ht 5\' 4"  (1.626 m)   Wt 165 lb (74.8 kg)   LMP  (LMP Unknown)   SpO2 100%   BMI 28.32 kg/m  GEN: NAD EYE: Sclerae anicteric ENT: MMM CV: Non-tachycardic Pulm: CTA b/l GI: Soft, NT/ND NEURO:  Alert & Oriented x 3   Erick Blinks, MD [facility]  09/07/2022 9:02 AM

## 2022-09-07 NOTE — Op Note (Signed)
[facility] Patient Name: Sydney Dickson Procedure Date: 09/07/2022 9:00 AM MRN: [id number] Endoscopist: Beverley Fiedler , MD, [id number] Age: 67 Referring MD:  Date of Birth: [birth year] Gender: Female Account #: [id number] Procedure:                Colonoscopy Indications:              High risk colon cancer surveillance: Personal                            history of hereditary nonpolyposis colorectal                            cancer (Lynch Syndrome), Last colonoscopy: April                            2023 Medicines:                Monitored Anesthesia Care Procedure:                Pre-Anesthesia Assessment:                           - Prior to the procedure, a History and Physical                            was performed, and patient medications and                            allergies were reviewed. The patient's tolerance of                            previous anesthesia was also reviewed. The risks                            and benefits of the procedure and the sedation                            options and risks were discussed with the patient.                            All questions were answered, and informed consent                            was obtained. Prior Anticoagulants: The patient has                            taken no anticoagulant or antiplatelet agents. ASA                            Grade Assessment: III - A patient with severe                            systemic disease. After reviewing the risks and  benefits, the patient was deemed in satisfactory                            condition to undergo the procedure.                           After obtaining informed consent, the colonoscope                            was passed under direct vision. Throughout the                            procedure, the patient's blood pressure, pulse, and                            oxygen saturations were monitored continuously. The                             [id number]-HQ190L Colonoscope [id number] was introduced                            through the anus and advanced to the cecum,                            identified by appendiceal orifice and ileocecal                            valve. The colonoscopy was performed without                            difficulty. The patient tolerated the procedure                            well. The quality of the bowel preparation was                            good. The ileocecal valve, appendiceal orifice, and                            rectum were photographed. Scope In: 9:15:29 AM Scope Out: 9:37:47 AM Scope Withdrawal Time: 0 hours 16 minutes 8 seconds  Total Procedure Duration: 0 hours 22 minutes 18 seconds  Findings:                 The digital rectal exam was normal.                           A 6 mm polyp was found in the cecum. The polyp was                            sessile. The polyp was removed with a cold snare.                            Resection and retrieval were complete.  Two sessile polyps were found in the ascending                            colon. The polyps were 2 to 3 mm in size. These                            polyps were removed with a cold snare. Resection                            and retrieval were complete.                           The exam was otherwise without abnormality on                            direct and retroflexion views. Complications:            No immediate complications. Estimated Blood Loss:     Estimated blood loss: none. Impression:               - One 6 mm polyp in the cecum, removed with a cold                            snare. Resected and retrieved.                           - Two 2 to 3 mm polyps in the ascending colon,                            removed with a cold snare. Resected and retrieved.                           - The examination was otherwise normal on direct                            and retroflexion  views. Recommendation:           - Patient has a contact number available for                            emergencies. The signs and symptoms of potential                            delayed complications were discussed with the                            patient. Return to normal activities tomorrow.                            Written discharge instructions were provided to the                            patient.                           -  Resume previous diet.                           - Continue present medications.                           - Await pathology results.                           - Repeat colonoscopy in 1 year for surveillance. Beverley Fiedler, MD 09/07/2022 9:41:21 AM This report has been signed electronically.

## 2022-09-07 NOTE — Patient Instructions (Signed)
Discharge instructions given. Handouts on polyps. Resume previous medications. YOU HAD AN ENDOSCOPIC PROCEDURE TODAY AT THE [facility]:   Refer to the procedure report that was given to you for any specific questions about what was found during the examination.  If the procedure report does not answer your questions, please call your gastroenterologist to clarify.  If you requested that your care partner not be given the details of your procedure findings, then the procedure report has been included in a sealed envelope for you to review at your convenience later.  YOU SHOULD EXPECT: Some feelings of bloating in the abdomen. Passage of more gas than usual.  Walking can help get rid of the air that was put into your GI tract during the procedure and reduce the bloating. If you had a lower endoscopy (such as a colonoscopy or flexible sigmoidoscopy) you may notice spotting of blood in your stool or on the toilet paper. If you underwent a bowel prep for your procedure, you may not have a normal bowel movement for a few days.  Please Note:  You might notice some irritation and congestion in your nose or some drainage.  This is from the oxygen used during your procedure.  There is no need for concern and it should clear up in a day or so.  SYMPTOMS TO REPORT IMMEDIATELY:  Following lower endoscopy (colonoscopy or flexible sigmoidoscopy):  Excessive amounts of blood in the stool  Significant tenderness or worsening of abdominal pains  Swelling of the abdomen that is new, acute  Fever of 100F or higher   For urgent or emergent issues, a gastroenterologist can be reached at any hour by calling [phone]. Do not use MyChart messaging for urgent concerns.    DIET:  We do recommend a small meal at first, but then you may proceed to your regular diet.  Drink plenty of fluids but you should avoid alcoholic beverages for 24 hours.  ACTIVITY:  You should plan to take it easy for the rest  of today and you should NOT DRIVE or use heavy machinery until tomorrow (because of the sedation medicines used during the test).    FOLLOW UP: Our staff will call the number listed on your records the next business day following your procedure.  We will call around 7:15- 8:00 am to check on you and address any questions or concerns that you may have regarding the information given to you following your procedure. If we do not reach you, we will leave a message.     If any biopsies were taken you will be contacted by phone or by letter within the next 1-3 weeks.  Please call us at [phone] if you have not heard about the biopsies in 3 weeks.    SIGNATURES/CONFIDENTIALITY: You and/or your care partner have signed paperwork which will be entered into your electronic medical record.  These signatures attest to the fact that that the information above on your After Visit Summary has been reviewed and is understood.  Full responsibility of the confidentiality of this discharge information lies with you and/or your care-partner. 

## 2022-09-07 NOTE — Progress Notes (Signed)
Pt's states no medical or surgical changes since previsit or office visit. 

## 2022-09-07 NOTE — Progress Notes (Signed)
Pt resting comfortably. VSS. Airway intact. SBAR complete to RN. All questions answered.   

## 2022-09-07 NOTE — Progress Notes (Signed)
Called to room to assist during endoscopic procedure.  Patient ID and intended procedure confirmed with present staff. Received instructions for my participation in the procedure from the performing physician.  

## 2022-09-08 ENCOUNTER — Telehealth: Payer: Self-pay | Admitting: *Deleted

## 2022-09-08 NOTE — Telephone Encounter (Signed)
  Follow up Call-     09/07/2022    8:03 AM 07/25/2021    7:49 AM  Call back number  Post procedure Call Back phone  # [phone]  Permission to leave phone message Yes Yes   LMOM

## 2022-09-09 ENCOUNTER — Encounter: Payer: Self-pay | Admitting: Internal Medicine

## 2022-09-18 ENCOUNTER — Other Ambulatory Visit: Payer: Self-pay | Admitting: Nurse Practitioner

## 2022-09-18 ENCOUNTER — Other Ambulatory Visit: Payer: Self-pay | Admitting: Internal Medicine

## 2022-09-18 MED ORDER — TIRZEPATIDE 10 MG/0.5ML ~~LOC~~ SOAJ: SUBCUTANEOUS | 0 refills | Status: DC

## 2022-09-20 ENCOUNTER — Other Ambulatory Visit: Payer: Self-pay | Admitting: Nurse Practitioner

## 2022-09-20 DIAGNOSIS — F988 Other specified behavioral and emotional disorders with onset usually occurring in childhood and adolescence: Secondary | ICD-10-CM

## 2022-09-20 MED ORDER — LISDEXAMFETAMINE DIMESYLATE 20 MG PO CAPS
ORAL_CAPSULE | ORAL | 0 refills | Status: DC
Start: 2022-09-20 — End: 2023-01-25

## 2022-09-23 ENCOUNTER — Other Ambulatory Visit: Payer: Self-pay | Admitting: Nurse Practitioner

## 2022-09-23 DIAGNOSIS — F988 Other specified behavioral and emotional disorders with onset usually occurring in childhood and adolescence: Secondary | ICD-10-CM

## 2022-10-07 ENCOUNTER — Other Ambulatory Visit: Payer: Self-pay | Admitting: Nurse Practitioner

## 2022-10-07 DIAGNOSIS — F330 Major depressive disorder, recurrent, mild: Secondary | ICD-10-CM

## 2022-10-12 ENCOUNTER — Other Ambulatory Visit: Payer: Self-pay | Admitting: Nurse Practitioner

## 2022-10-12 DIAGNOSIS — F988 Other specified behavioral and emotional disorders with onset usually occurring in childhood and adolescence: Secondary | ICD-10-CM

## 2022-10-16 ENCOUNTER — Other Ambulatory Visit: Payer: Self-pay | Admitting: Internal Medicine

## 2022-10-26 ENCOUNTER — Other Ambulatory Visit: Payer: Self-pay | Admitting: Nurse Practitioner

## 2022-10-26 DIAGNOSIS — F419 Anxiety disorder, unspecified: Secondary | ICD-10-CM

## 2022-10-27 ENCOUNTER — Ambulatory Visit (INDEPENDENT_AMBULATORY_CARE_PROVIDER_SITE_OTHER): Payer: Medicare HMO | Admitting: Nurse Practitioner

## 2022-10-27 ENCOUNTER — Encounter: Payer: Self-pay | Admitting: Nurse Practitioner

## 2022-10-27 ENCOUNTER — Other Ambulatory Visit: Payer: Self-pay | Admitting: Nurse Practitioner

## 2022-10-27 ENCOUNTER — Other Ambulatory Visit: Payer: Self-pay

## 2022-10-27 VITALS — BP 100/70 | HR 91 | Temp 98.2°F | Ht 64.0 in | Wt 153.0 lb

## 2022-10-27 DIAGNOSIS — Z1152 Encounter for screening for COVID-19: Secondary | ICD-10-CM

## 2022-10-27 DIAGNOSIS — U071 COVID-19: Secondary | ICD-10-CM

## 2022-10-27 DIAGNOSIS — R6889 Other general symptoms and signs: Secondary | ICD-10-CM | POA: Diagnosis not present

## 2022-10-27 LAB — POC COVID19 BINAXNOW: SARS Coronavirus 2 Ag: POSITIVE — AB

## 2022-10-27 LAB — POCT INFLUENZA A/B
Influenza A, POC: NEGATIVE
Influenza B, POC: NEGATIVE

## 2022-10-27 MED ORDER — LOSARTAN POTASSIUM 100 MG PO TABS: ORAL_TABLET | ORAL | 3 refills | Status: DC

## 2022-10-27 MED ORDER — ALPRAZOLAM 0.5 MG PO TABS
0.5000 mg | ORAL_TABLET | Freq: Three times a day (TID) | ORAL | 0 refills | Status: DC | PRN
Start: 2022-10-27 — End: 2023-01-25

## 2022-10-27 MED ORDER — PREDNISONE 10 MG PO TABS
ORAL_TABLET | ORAL | 0 refills | Status: DC
Start: 2022-10-27 — End: 2022-12-01

## 2022-10-27 NOTE — Patient Instructions (Signed)

## 2022-10-27 NOTE — Progress Notes (Signed)
THIS ENCOUNTER IS A VIRTUAL VISIT DUE TO COVID-19 - PATIENT WAS NOT SEEN IN THE OFFICE.  PATIENT HAS CONSENTED TO VIRTUAL VISIT / TELEMEDICINE VISIT   Virtual Visit via telephone Note  I connected with  Sydney Dickson on 10/27/2022 by telephone.  I verified that I am speaking with the correct person using two identifiers.    I discussed the limitations of evaluation and management by telemedicine and the availability of in person appointments. The patient expressed understanding and agreed to proceed.  History of Present Illness:  BP 100/70   Pulse 91   Temp 98.2 F (36.8 C)   Ht 5\' 4"  (1.626 m)   Wt 153 lb (69.4 kg)   LMP  (LMP Unknown)   SpO2 98%   BMI 26.26 kg/m   67 y.o. patient contacted office reporting URI sx nasal drainage, HA. she tested positive by in office test. OV was conducted by telephone to minimize exposure. This patient reports that she has vaccinated for covid 19, "years ago" but no immunization recorded.  Sx began 2 days ago with HA  Treatments tried so far: OTC Tylenol Sinus  Exposures: Unknown   Medications  Current Outpatient Medications (Endocrine & Metabolic):    NOVOLIN 70/30 (70-30) 100 UNIT/ML injection, INJECT 25 UNITS SUBCUTANEOUSLY IN THE MORNING AND 20 UNITS AT DINNER.   tirzepatide (MOUNJARO) 10 MG/0.5ML Pen, INJECT 1 PEN ( 10 MG ) INTO SKIN EVERY 7 DAYS ( DX: E11.29)  Current Outpatient Medications (Cardiovascular):    losartan (COZAAR) 100 MG tablet, TAKE 1 TABLET DAILY FOR BP & DIABETIC KIDNEY PROTECTION   rosuvastatin (CRESTOR) 20 MG tablet, TAKE 1 TABLET BY MOUTH EVERY DAY FOR CHOLESTEROL  Current Outpatient Medications (Respiratory):    fluticasone (FLONASE) 50 MCG/ACT nasal spray, Place 2 sprays into both nostrils daily.   ipratropium (ATROVENT) 0.06 % nasal spray, Place 2 sprays into the nose 3 (three) times daily.   levocetirizine (XYZAL) 5 MG tablet, TAKE 1 TAB BY MOUTH DAILY FOR ALLERGIES  Current Outpatient Medications  (Analgesics):    acetaminophen (TYLENOL) 500 MG tablet, Take 1,000 mg by mouth daily as needed for moderate pain or headache.   aspirin 81 MG tablet, Take 81 mg by mouth daily.  Current Outpatient Medications (Hematological):    Cyanocobalamin (B-12 PO), Take by mouth.  Current Outpatient Medications (Other):    ALPRAZolam (XANAX) 0.5 MG tablet, Take 1 tablet (0.5 mg total) by mouth 3 (three) times daily as needed.   busPIRone (BUSPAR) 5 MG tablet, Take     1/2 to 1 tablet     3 x / day      for Anxiety (Dx:  f33.0)   CHOLECALCIFEROL PO, Take 5,000 Units by mouth daily.   escitalopram (LEXAPRO) 20 MG tablet, TAKE 1 TABLET BY MOUTH DAILY FOR MOOD   glucose blood (ONETOUCH VERIO) test strip, Use as instructedCHECK BLOOD SUGAR 3 TIMES DAILY-DX-E11.65   Insulin Syringe-Needle U-100 (BD INSULIN SYRINGE U/F) 31G X 5/16" 1 ML MISC, USE AS DIRECTED TWICE A DAY   Lancets (ONETOUCH ULTRASOFT) lancets, Use 3 lancets daily to check blood sugar-DX-E11.65   lisdexamfetamine (VYVANSE) 20 MG capsule, Take 1 tab daily in the morning as needed, avoid taking daily due to risk for tolerance. 30 tabs should last longer than a month.   Na Sulfate-K Sulfate-Mg Sulf 17.5-3.13-1.6 GM/177ML SOLN, See Admin Instructions. PLEASE SEE ATTACHED FOR DETAILED DIRECTIONS   ondansetron (ZOFRAN) 4 MG tablet, Take one tablet every 6-8 hours as needed  for nausea.  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   Social History:   reports that she has never smoked. She has never used smokeless tobacco. She reports that she does not drink alcohol and does not use drugs.  Observations/Objective:  General : Well sounding patient in no apparent distress HEENT: no hoarseness, no cough for duration of visit Lungs: speaks in complete sentences, no audible wheezing, no apparent distress Neurological: alert, oriented x 3 Psychiatric: pleasant, judgement appropriate   Assessment and Plan:  Covid 19 Covid 19 positive per rapid screening test in office Risk factors include: has Varicose vein of leg; ADD (attention deficit disorder); Essential hypertension; Hyperlipidemia associated with type 2 diabetes mellitus (HCC); Type 2 diabetes mellitus with hyperglycemia (HCC); 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 Symptoms are: mild Due to co morbid conditions and risk factors, discussed antivirals  Immue support reviewed and recommended to take vitamin C, vitamin D,  zinc Take tylenol PRN temp 101+ Push hydration Regular ambulation or calf exercises exercises for clot prevention and 81 mg ASA unless contraindicated Sx supportive therapy suggested Follow up via mychart or telephone if needed Advised patient obtain O2 monitor; present to ED if persistently <90% or with severe dyspnea, CP, fever uncontrolled by tylenol, confusion, sudden decline       Should remain in isolation 5 days from testing positive and then wear a mask when around other people for the following 5 days  Meds ordered this encounter  Medications   predniSONE (DELTASONE) 10 MG tablet    Sig: 1 tab 3 x day for 2 days, then 1 tab 2 x day for 2 days, then 1 tab 1 x day for 3 days    Dispense:  13 tablet    Refill:  0    Order Specific Question:   Supervising Provider    Answer:   Lucky Cowboy [phone]    Follow Up Instructions:  I discussed the assessment and treatment plan with the patient. The patient was provided an opportunity to ask questions and all were answered. The patient agreed with the plan and demonstrated an understanding  of the instructions.   The patient was advised to call back or seek an in-person evaluation if the symptoms worsen or if the condition fails to improve as anticipated.  I provided 15 minutes of non-face-to-face time during this encounter.   Adela Glimpse, NP

## 2022-11-02 ENCOUNTER — Encounter: Payer: Medicare HMO | Admitting: Nurse Practitioner

## 2022-11-04 ENCOUNTER — Encounter: Payer: Medicare HMO | Admitting: Nurse Practitioner

## 2022-11-10 ENCOUNTER — Encounter: Payer: Medicare HMO | Admitting: Nurse Practitioner

## 2022-11-11 NOTE — Progress Notes (Deleted)
COMPLETE PHYSICAL  Assessment and Plan:  Essential hypertension - continue medications: Losartan 100mg  daily DASH diet, exercise and monitor at home. Call if greater than 130/80.  -     CBC with Differential/Platelet -     CMP -     TSH -     Urinalysis, Routine w reflex microscopic -     Microalbumin / creatinine urine ratio -     EKG 12-Lead  Lynch syndrome -     Continue follow up GYN/GI Yearly colonoscopy, had 05/2021 Dr Rhea Belton  Diabetes mellitus with hyperlipidemia [facility]) Discussed general issues about diabetes pathophysiology and management., Educational material distributed., Suggested low cholesterol diet., Encouraged aerobic exercise., Discussed foot care., Reminded to get yearly retinal exam.  Medication management Continuned  Vitamin D deficiency Continue supplement  Varicose veins of both lower extremities, unspecified whether complicated - weight loss discussed, continue compression stockings and elevation - Refer to vein and vascular for evaluation  Attention deficit disorder, unspecified hyperactivity presence vyvanse 20 mg daily with benefit Doing well  Type 2 diabetes mellitus with obesity - follow up 3 months for progress monitoring - increase veggies, decrease carbs - long discussion about weight loss, diet, and exercise  Hyperlipidemia associated with type 2 diabetes mellitus (HCC) Continue medications: Rosuvastatin 20mg  daily Discussed dietary and exercise modifications Low fat diet -     Lipid panel   Type 2 diabetes mellitus with hyperglycemia, with long-term current use of insulin (HCC) Continue medications: novolin 70/30 : 25 units in am and 20 units in pm, likely will need to increase this. Taking Metformin 750mg  BID Mounjaro increase to 5 mg SQ QW Discussed general issues about diabetes pathophysiology and management. Education: Reviewed 'ABCs' of diabetes management (respective goals in parentheses):  A1C (<7), blood pressure (<130/80),  and cholesterol (LDL <70) Dietary recommendations Encouraged aerobic exercise.  Discussed foot care, check daily Yearly retinal exam Dental exam every 6 months Monitor blood glucose, discussed goal for patient -     Hemoglobin A1c -     EKG 12-Lead   Screening, ischemic heart disease -     EKG 12-Lead  Screening for blood or protein in urine -     Urinalysis w reflex microscopic - Microalbumin/creatinine urine ratio  Family history of CLL- brother and son - Iron/TIBC - Ferritin - serum protein electrophoresis - urine protein electroporesis     Discussed med's effects and SE's. Screening labs and tests as requested with regular follow-up as recommended. Over 40 minutes of face to face interview exam, counseling, chart review, and complex, high level critical decision making was performed this visit.   Future Appointments  Date Time Provider Department Center  11/12/2022 10:00 AM Raynelle Dick, NP [facility] None  05/06/2023 11:00 AM Adela Glimpse, NP [facility] None  11/12/2023 10:00 AM Raynelle Dick, NP [facility] None    HPI  67 y.o. female  presents for a complete physical.  No health or medication concerns today.  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  She plans to follow with ENT.  She uses an app to track her blood glucose the average is 150-200. Highest 234.  She checks this BID.She is taking Novolin 70/30, 25 units in am and 20 in HS.   Metformin 750mg  BID, Mounjaro 2.5 mg- currently in the donut hole. She is on ASA 81 mg daily. Losartan 100 mg daily for BP and kidney protection Discussed  importance of lowering her blood glucose readings,  Dicsussed increase RISK OF HEART ATTACK AND STROKE!  Also increased risk of organ damage. Lab Results  Component Value Date   HGBA1C 11.0 (H) 08/12/2022    She has area above right eyebrow that has been present for several weeks  Is not itchy or  painful does not flake .   Her brother and son both have CLL and she would like testing to evaluate for this condition   She reports she walks her dog daily with her husband.  Keeps her 55 year old grandson every day. She reports she drink 64oz of water a day.  Bilateral hip pain x months, lower back bilateral. Declines referral to PT or ortho. Bilateral SI No pain down her legs, no weakness in her legs.  Hands are not hurting as bad.   BMI is There is no height or weight on file to calculate BMI., she is working on diet and exercise. Wt Readings from Last 3 Encounters:  10/27/22 153 lb (69.4 kg)  09/07/22 165 lb (74.8 kg)  08/12/22 163 lb 3.2 oz (74 kg)   Her blood pressure has been controlled at home, today their BP is    BP Readings from Last 3 Encounters:  10/27/22 100/70  09/07/22 127/73  08/12/22 118/72  She does workout. She denies chest pain, shortness of breath, dizziness.   Some seasonal allergies. No wheezing but has constant drainage, she is on xyzal . No GERD symptoms.   She is currently on Rosuvastatin 20 mg daily, denies Myalgias.   Lab Results  Component Value Date   CHOL 121 08/12/2022   HDL 53 08/12/2022   LDLCALC 50 08/12/2022   TRIG 98 08/12/2022   CHOLHDL 2.3 08/12/2022    Lab Results  Component Value Date   GFRNONAA 76 06/18/2020   Patient is on Vitamin D supplement.   Lab Results  Component Value Date   VD25OH 53 08/12/2022     She has anxiety/depression, is on xanax PRN and lexapro She has ADD and is on Vyvanse.  Patient has history of lynch syndrome, follows with Dr. Rhea Belton, had partial prioctectomy with Dr. Michaell Cowing on 07/19/2014.  They have also advised increase risk of other cancers including endometrial and ovarian cancer, she has not had a TAH due to recent cancer diagnosis in her husband.    Current Medications:    Current Outpatient Medications (Endocrine & Metabolic):    NOVOLIN 70/30 (70-30) 100 UNIT/ML injection, INJECT 25 UNITS  SUBCUTANEOUSLY IN THE MORNING AND 20 UNITS AT DINNER.   predniSONE (DELTASONE) 10 MG tablet, 1 tab 3 x day for 2 days, then 1 tab 2 x day for 2 days, then 1 tab 1 x day for 3 days   tirzepatide (MOUNJARO) 10 MG/0.5ML Pen, INJECT 1 PEN ( 10 MG ) INTO SKIN EVERY 7 DAYS ( DX: E11.29)  Current Outpatient Medications (Cardiovascular):    losartan (COZAAR) 100 MG tablet, Take 1 tablet Daily for BP & Diabetic Kidney Protection   rosuvastatin (CRESTOR) 20 MG tablet, TAKE 1 TABLET BY MOUTH EVERY DAY FOR CHOLESTEROL  Current Outpatient Medications (Respiratory):    fluticasone (FLONASE) 50 MCG/ACT nasal spray, Place 2 sprays into both nostrils daily.   ipratropium (ATROVENT) 0.06 % nasal spray, Place 2 sprays into the nose 3 (three) times daily.   levocetirizine (XYZAL) 5 MG tablet, TAKE 1 TAB BY MOUTH DAILY FOR ALLERGIES  Current Outpatient Medications (Analgesics):    acetaminophen (TYLENOL) 500 MG  tablet, Take 1,000 mg by mouth daily as needed for moderate pain or headache.   aspirin 81 MG tablet, Take 81 mg by mouth daily.  Current Outpatient Medications (Hematological):    Cyanocobalamin (B-12 PO), Take by mouth.  Current Outpatient Medications (Other):    ALPRAZolam (XANAX) 0.5 MG tablet, Take 1 tablet (0.5 mg total) by mouth 3 (three) times daily as needed.   busPIRone (BUSPAR) 5 MG tablet, Take     1/2 to 1 tablet     3 x / day      for Anxiety (Dx:  f33.0)   CHOLECALCIFEROL PO, Take 5,000 Units by mouth daily.   escitalopram (LEXAPRO) 20 MG tablet, TAKE 1 TABLET BY MOUTH DAILY FOR MOOD   glucose blood (ONETOUCH VERIO) test strip, Use as instructedCHECK BLOOD SUGAR 3 TIMES DAILY-DX-E11.65   Insulin Syringe-Needle U-100 (BD INSULIN SYRINGE U/F) 31G X 5/16" 1 ML MISC, USE AS DIRECTED TWICE A DAY   Lancets (ONETOUCH ULTRASOFT) lancets, Use 3 lancets daily to check blood sugar-DX-E11.65   lisdexamfetamine (VYVANSE) 20 MG capsule, Take 1 tab daily in the morning as needed, avoid taking daily due  to risk for tolerance. 30 tabs should last longer than a month.   Na Sulfate-K Sulfate-Mg Sulf 17.5-3.13-1.6 GM/177ML SOLN, See Admin Instructions. PLEASE SEE ATTACHED FOR DETAILED DIRECTIONS   ondansetron (ZOFRAN) 4 MG tablet, Take one tablet every 6-8 hours as needed for nausea.  Health Maintenance:   Immunization History  Administered Date(s) Administered   Influenza Inj Mdck Quad With Preservative 01/22/2017, 12/30/2017, 01/16/2019, 01/08/2020   Influenza Split 03/08/2014, 01/17/2015   Influenza, High Dose Seasonal PF 02/02/2022   Influenza, Seasonal, Injecte, Preservative Fre 01/08/2016   Influenza-Unspecified 03/20/2021   PPD Test 12/28/2013   Pneumococcal Polysaccharide-23 04/22/2015   Td 10/21/2005   Tdap 08/03/2013   Tetanus: 2015 Pneumovax: 2017 Prevnar 13:  Age 43 Flu vaccine: 2021 Zostavax: declines  No LMP recorded (lmp unknown). Patient is postmenopausal. Pap: today MGM: 05/2021 DEXA: Colonoscopy:07/25/21 EGD: 05/2017 Stress test 2007 CXR 2009 Eye exam 09/2020 no retinopathy Dentist: 06/2020  Patient Care Team: Lucky Cowboy, MD as PCP - General (Internal Medicine) Sharrell Ku, MD as Consulting Physician (Gastroenterology) Nils Flack, MD as Referring Physician (Dermatology) Marisue Brooklyn, DO (Internal Medicine) Valeria Batman, MD (Inactive) as Consulting Physician (Orthopedic Surgery) Jethro Bolus, MD as Consulting Physician (Ophthalmology) Lewayne Bunting, MD as Consulting Physician (Cardiology) Karie Soda, MD as Consulting Physician (General Surgery) Pyrtle, Carie Caddy, MD as Consulting Physician (Gastroenterology)  Medical History:  Past Medical History:  Diagnosis Date   ADD (attention deficit disorder)    Allergy    Anxiety    Arthritis    Asthma    with severe allergies pt does use albuterol inhaler when needed   Cataract    removed both eyes   Depression    Diabetes mellitus without complication (HCC) 2010   TYPE 2     GERD (gastroesophageal reflux disease)    hx of had Nissen Fundoplication 2001 has not had any issues since   History of hiatal hernia    Hyperlipidemia 2004   Hypertension    IBS (irritable bowel syndrome)    Lynch syndrome    Neuromuscular disorder (HCC)    Fibromyalgia   Tubular adenoma of colon    Allergies Allergies  Allergen Reactions   Tape Rash   Ace Inhibitors Cough   Metformin And Related     myalgia   Morphine And Codeine Itching  Penicillins     Pt states that in 3rd grade she was given an injection in the buttock and reacted with a large hive in that area Pt can take oral penicillin with no reaction     Wellbutrin [Bupropion]     Felt funny    SURGICAL HISTORY She  has a past surgical history that includes Hernia repair; Cholecystectomy; Tubal ligation; Cosmetic surgery; Rotator cuff repair (Right, 04/06/2010); Eye surgery (04/06/2004); Nissen fundoplication (04/07/1999); Carpal tunnel release (1998, 2001); Nasal sinus surgery; colonscopy ; Upper gi endoscopy; Partial proctectomy by tem (N/A, 07/19/2014); and Polypectomy.   FAMILY HISTORY Her family history includes Breast cancer in her mother and sister; Cancer in her mother and paternal grandmother; Colon cancer (age of onset: 43) in her brother; Colon cancer (age of onset: 61) in her paternal grandfather and another family member; Colon cancer (age of onset: 53) in her father; Colon polyps in her brother, father, paternal grandfather, and another family member; Diabetes in her mother; Hyperlipidemia in her father and mother; Hypertension in her father and mother; Prostate cancer in her father; Skin cancer in her father.   SOCIAL HISTORY She  reports that she has never smoked. She has never used smokeless tobacco. She reports that she does not drink alcohol and does not use drugs.  Review of Systems: Review of Systems  Constitutional:  Negative for chills, diaphoresis, fever, malaise/fatigue and weight loss.   HENT:  Negative for congestion, ear discharge, ear pain, hearing loss, nosebleeds, sinus pain, sore throat and tinnitus.        Dry mouth  Eyes:  Negative for blurred vision, double vision, photophobia, pain, discharge and redness.  Respiratory:  Negative for cough, hemoptysis, sputum production, shortness of breath, wheezing and stridor.   Cardiovascular:  Negative for chest pain, palpitations, orthopnea, claudication, leg swelling and PND.       Varicose veins   Gastrointestinal:  Negative for abdominal pain, blood in stool, constipation, diarrhea, heartburn, melena, nausea and vomiting.  Genitourinary:  Negative for dysuria, flank pain, frequency, hematuria and urgency.  Musculoskeletal:  Positive for back pain and joint pain. Negative for falls, myalgias and neck pain.  Skin:  Negative for itching and rash.       Lesion over right eye  Neurological:  Negative for dizziness, tingling, tremors, sensory change, speech change, focal weakness, seizures, loss of consciousness, weakness and headaches.  Endo/Heme/Allergies:  Negative for environmental allergies and polydipsia. Does not bruise/bleed easily.  Psychiatric/Behavioral:  Negative for depression, hallucinations, memory loss, substance abuse and suicidal ideas. The patient is not nervous/anxious and does not have insomnia.     Physical Exam: Estimated body mass index is 26.26 kg/m as calculated from the following:   Height as of 10/27/22: 5\' 4"  (1.626 m).   Weight as of 10/27/22: 153 lb (69.4 kg). LMP  (LMP Unknown)    General Appearance: Well nourished, in no apparent distress.  Eyes: PERRLA, EOMs, conjunctiva no swelling or erythema, normal fundi and vessels.  Sinuses: No Frontal/maxillary tenderness  ENT/Mouth: Ext aud canals clear, normal light reflex with TMs without erythema, bulging.  No erythema, swelling, or exudate on post pharynx. Tonsils not swollen or erythematous. Hearing normal.  Neck: Supple, thyroid normal. No bruits   Respiratory: Respiratory effort normal, BS equal bilaterally without rales, rhonchi, wheezing or stridor.  Cardio: RRR without murmurs, rubs or gallops. Brisk peripheral pulses without edema. Varicose veins L>R lower extremities Chest: symmetric, with normal excursions and percussion.  Breasts: breasts appear normal,  no suspicious masses, no skin or nipple changes or axillary nodes.  Abdomen: Soft, + epigastric tenderness, no guarding, rebound, hernias, masses, or organomegaly.  Lymphatics: Non tender without lymphadenopathy.  Pelvic exam: normal external genitalia, vulva, vagina, cervix, uterus and adnexa, PAP: Pap smear done today, thin-prep method. Musculoskeletal: Full ROM all peripheral extremities,5/5 strength, and normal gait.  Skin: reddened flat approx 2 cm area above right eyebrow Neuro: Cranial nerves intact, reflexes equal bilaterally. Normal muscle tone, no cerebellar symptoms. Sensation intact.  Psych: Awake and oriented X 3, normal affect, Insight and Judgment appropriate.   EKG: NSR, no ST changes    [facility]  11/11/2022

## 2022-11-12 ENCOUNTER — Encounter: Payer: Medicare HMO | Admitting: Nurse Practitioner

## 2022-11-12 DIAGNOSIS — Z79899 Other long term (current) drug therapy: Secondary | ICD-10-CM

## 2022-11-12 DIAGNOSIS — E1140 Type 2 diabetes mellitus with diabetic neuropathy, unspecified: Secondary | ICD-10-CM

## 2022-11-12 DIAGNOSIS — Z806 Family history of leukemia: Secondary | ICD-10-CM

## 2022-11-12 DIAGNOSIS — N182 Chronic kidney disease, stage 2 (mild): Secondary | ICD-10-CM

## 2022-11-12 DIAGNOSIS — Z136 Encounter for screening for cardiovascular disorders: Secondary | ICD-10-CM

## 2022-11-12 DIAGNOSIS — Z1329 Encounter for screening for other suspected endocrine disorder: Secondary | ICD-10-CM

## 2022-11-12 DIAGNOSIS — Z1509 Genetic susceptibility to other malignant neoplasm: Secondary | ICD-10-CM

## 2022-11-12 DIAGNOSIS — E1165 Type 2 diabetes mellitus with hyperglycemia: Secondary | ICD-10-CM

## 2022-11-12 DIAGNOSIS — I10 Essential (primary) hypertension: Secondary | ICD-10-CM

## 2022-11-12 DIAGNOSIS — F330 Major depressive disorder, recurrent, mild: Secondary | ICD-10-CM

## 2022-11-12 DIAGNOSIS — Z1389 Encounter for screening for other disorder: Secondary | ICD-10-CM

## 2022-11-12 DIAGNOSIS — E559 Vitamin D deficiency, unspecified: Secondary | ICD-10-CM

## 2022-11-12 DIAGNOSIS — Z0001 Encounter for general adult medical examination with abnormal findings: Secondary | ICD-10-CM

## 2022-11-12 DIAGNOSIS — E1169 Type 2 diabetes mellitus with other specified complication: Secondary | ICD-10-CM

## 2022-11-30 NOTE — Progress Notes (Unsigned)
COMPLETE PHYSICAL  Assessment and Plan: Encounter for general adult medical examination with abnormal findings Due Yearly Mammogram 06/04/22 PAP 10/30/21- negative  Essential hypertension - continue medications: Losartan 100mg  daily - Continue DASH diet, exercise and monitor at home. Call if greater than 130/80.  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  Perform daily foot/skin check, notify office of any concerning changes.  Discussed general issues about diabetes pathophysiology and management. Education: Reviewed 'ABCs' of diabetes management (respective goals in parentheses):  A1C (<7), blood pressure (<130/80), and cholesterol (LDL <70) Dietary recommendations Encouraged aerobic exercise.  Discussed foot care, check daily Yearly retinal exam 05/2022 Dental exam every 6 months Monitor blood glucose, discussed goal for patient -     Hemoglobin A1c -     EKG 12-Lead  Neuropathy due to type 2 diabetes mellitus (HCC) No longer taking Gabapentin Monitor symptoms  Diabetes mellitus with hyperlipidemia (HCC) Discussed general issues about diabetes pathophysiology and management., Educational material distributed., Suggested low cholesterol diet., Encouraged aerobic exercise., Discussed foot care., Reminded to get yearly retinal exam.  Anxiety/Insomnia Buspar did not help and has stopped  Medication management Continuned  Vitamin D deficiency Continue  supplement  Varicose veins of both lower extremities, unspecified whether complicated - weight loss discussed, continue compression stockings and elevation - Refer to vein and vascular for evaluation  Attention deficit disorder, unspecified hyperactivity presence vyvanse 20 mg daily with benefit Doing well  Overweight - follow up 3 months for progress monitoring - increase veggies, decrease carbs - long discussion about weight loss, diet, and exercise  Hyperlipidemia associated with type 2 diabetes mellitus (HCC) Continue medications: Rosuvastatin 20mg  daily Discussed dietary and exercise modifications Low fat diet -     Lipid panel   CKD Stage 2 with type 2 Diabetes Mellitus(HCC) Increase fluids, avoid NSAIDS, monitor sugars, will monitor   Screening, ischemic heart disease -     EKG 12-Lead  Screening for blood or protein in urine -     Urinalysis w reflex microscopic - Microalbumin/creatinine urine ratio  Screening for thyroid disorder -TSH  Screening for AAA - U/S ABD Retroperitoneal LTD  Medication management -     CBC with Differential/Platelet -     COMPLETE METABOLIC PANEL WITH GFR -     Magnesium -     Lipid panel -     Hemoglobin A1C w/out eAG -     TSH -     VITAMIN D 25 Hydroxy (Vit-D Deficiency, Fractures) -     [place], RETROPERITNL ABD,  LTD -     EKG 12-Lead -     Urinalysis, Routine w reflex microscopic -     Microalbumin / creatinine urine ratio   Daytime somnolence/Snoring -     Ambulatory referral to Sleep Studies   Estrogen deficiency -     DG Bone Density; Future  Chronic  right shoulder pain Previous rotator cuff surgery of right shoulder -     Ambulatory referral to Orthopedic Surgery     Discussed med's effects and SE's. Screening labs and tests as requested with regular follow-up as recommended. Over 40 minutes of face to face interview exam, counseling, chart review, and complex, high level critical decision making was performed  this visit.   Future Appointments  Date Time Provider Department Center  05/06/2023 11:00 AM Adela Glimpse, NP [facility] None  12/01/2023  2:00 PM Raynelle Dick, NP [facility] None    HPI  67 y.o. female  presents for a complete physical. 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   She is having trouble falling asleep and staying asleep. She has a lot of daytime somnolence, her husband says she snores. She is taking Xanax 0.5 mg to fall asleep but wont stay asleep. She tried amitriptyline in the past with no success.    She uses an app to track her blood glucose this week fasting has been in 200's.  She has only been checking fasting.She stopped Novolin since increasing Mounjaro to 10 mg 2 months ago.  Mounjaro 10 mg QW. She is on ASA 81 mg daily. Losartan 100 mg daily for BP and kidney protection Discussed importance of lowering her blood glucose readings,  Dicsussed increase RISK OF HEART ATTACK AND STROKE!  Also increased risk of organ damage. Lab Results  Component Value Date   HGBA1C 11.0 (H) 08/12/2022    Her brother and son both have CLL , testing last year was WNL  She reports she walks her dog daily with her husband.  Keeps her 89 year old grandson every day. She reports she drink 64oz of water a day.  Bilateral hip pain x months, lower  back bilateral. Declines referral to PT or ortho. Bilateral SI No pain down her legs, no weakness in her legs.  Hands are not hurting as bad.   Her right shoulder has limited ROM, cannot lift overhead or to the back.  Decreased strength. Hd this shoulder operated on 12 years ago.  BMI is Body mass index is 25.82 kg/m., she is working on diet and exercise. She has lost 18 pounds in the past year, was 168 at last physical 10/30/21. Wt Readings from Last 3 Encounters:  12/01/22 150 lb 6.4 oz (68.2 kg)  10/27/22 153 lb (69.4 kg)  09/07/22 165 lb (74.8 kg)   Her blood pressure has been controlled at home, Losartan 100 mg every day today their BP is BP: 130/72  BP Readings from Last 3 Encounters:  12/01/22 130/72  10/27/22 100/70  09/07/22 127/73  She does workout. She denies chest pain, shortness of breath, dizziness.   Some seasonal allergies. No wheezing but has constant drainage, she is on xyzal . No GERD symptoms.   She is currently on Rosuvastatin 20 mg daily, denies Myalgias.   Lab Results  Component Value Date   CHOL 121 08/12/2022   HDL  53 08/12/2022   LDLCALC 50 08/12/2022   TRIG 98 08/12/2022   CHOLHDL 2.3 08/12/2022    She is drinking lots of water or diet cheerwine/caffeine free Lab Results  Component Value Date   EGFR 81 08/12/2022    Patient is on Vitamin D supplement.   Lab Results  Component Value Date   VD25OH 53 08/12/2022     She has anxiety/depression, is on xanax PRN and lexapro. She has been using her Xanax 0.5 mg daily for sleep.  Continues to have difficulty falling asleep and staying asleep.  She has ADD and is on Vyvanse.  Patient has history of lynch syndrome, follows with Dr. Rhea Belton, had partial prioctectomy with Dr. Michaell Cowing on 07/19/2014.  They have also advised increase risk of other cancers including endometrial and ovarian cancer, she has not had a TAH due to recent cancer diagnosis in her husband.    Current Medications:    Current  Outpatient Medications (Endocrine & Metabolic):    tirzepatide (MOUNJARO) 10 MG/0.5ML Pen, INJECT 1 PEN ( 10 MG ) INTO SKIN EVERY 7 DAYS ( DX: E11.29)   NOVOLIN 70/30 (70-30) 100 UNIT/ML injection, INJECT 25 UNITS SUBCUTANEOUSLY IN THE MORNING AND 20 UNITS AT DINNER. (Patient not taking: Reported on 12/01/2022)   predniSONE (DELTASONE) 10 MG tablet, 1 tab 3 x day for 2 days, then 1 tab 2 x day for 2 days, then 1 tab 1 x day for 3 days (Patient not taking: Reported on 12/01/2022)  Current Outpatient Medications (Cardiovascular):    losartan (COZAAR) 100 MG tablet, Take 1 tablet Daily for BP & Diabetic Kidney Protection   rosuvastatin (CRESTOR) 20 MG tablet, TAKE 1 TABLET BY MOUTH EVERY DAY FOR CHOLESTEROL  Current Outpatient Medications (Respiratory):    fluticasone (FLONASE) 50 MCG/ACT nasal spray, Place 2 sprays into both nostrils daily.   ipratropium (ATROVENT) 0.06 % nasal spray, Place 2 sprays into the nose 3 (three) times daily.   levocetirizine (XYZAL) 5 MG tablet, TAKE 1 TAB BY MOUTH DAILY FOR ALLERGIES  Current Outpatient Medications (Analgesics):    acetaminophen (TYLENOL) 500 MG tablet, Take 1,000 mg by mouth daily as needed for moderate pain or headache.   aspirin 81 MG tablet, Take 81 mg by mouth daily.  Current Outpatient Medications (Hematological):    Cyanocobalamin (B-12 PO), Take by mouth.  Current Outpatient Medications (Other):    ALPRAZolam (XANAX) 0.5 MG tablet, Take 1 tablet (0.5 mg total) by mouth 3 (three) times daily as needed.   CHOLECALCIFEROL PO, Take 5,000 Units by mouth daily.   escitalopram (LEXAPRO) 20 MG tablet, TAKE 1 TABLET BY MOUTH DAILY FOR MOOD   glucose blood (ONETOUCH VERIO) test strip, Use as instructedCHECK BLOOD SUGAR 3 TIMES DAILY-DX-E11.65   Insulin Syringe-Needle U-100 (BD INSULIN SYRINGE U/F) 31G X 5/16" 1 ML MISC, USE AS DIRECTED TWICE A DAY   Lancets (ONETOUCH ULTRASOFT) lancets, Use 3 lancets daily to check blood sugar-DX-E11.65    lisdexamfetamine (VYVANSE) 20 MG capsule, Take 1 tab daily in the morning as needed, avoid taking daily due to risk for tolerance. 30 tabs should last longer than a month.   Na Sulfate-K Sulfate-Mg Sulf 17.5-3.13-1.6 GM/177ML SOLN, See Admin Instructions. PLEASE SEE ATTACHED FOR DETAILED DIRECTIONS   ondansetron (ZOFRAN) 4 MG tablet, Take one tablet every 6-8 hours as needed for nausea.   busPIRone (BUSPAR) 5 MG tablet, Take     1/2 to 1 tablet     3 x / day  for Anxiety (Dx:  f33.0) (Patient not taking: Reported on 12/01/2022)  Health Maintenance:   Immunization History  Administered Date(s) Administered   Influenza Inj Mdck Quad With Preservative 01/22/2017, 12/30/2017, 01/16/2019, 01/08/2020   Influenza Split 03/08/2014, 01/17/2015   Influenza, High Dose Seasonal PF 02/02/2022   Influenza, Seasonal, Injecte, Preservative Fre 01/08/2016   Influenza-Unspecified 03/20/2021   PPD Test 12/28/2013   Pneumococcal Polysaccharide-23 04/22/2015   Td 10/21/2005   Tdap 08/03/2013   Health Maintenance  Topic Date Due   COVID-19 Vaccine (1) Never done   Zoster Vaccines- Shingrix (1 of 2) Never done   FOOT EXAM  08/26/2018   Pneumonia Vaccine 8+ Years old (2 of 2 - PCV) 04/18/2021   DEXA SCAN  Never done   Diabetic kidney evaluation - Urine ACR  10/31/2022   INFLUENZA VACCINE  11/05/2022   HEMOGLOBIN A1C  02/12/2023   Medicare Annual Wellness (AWV)  05/06/2023   OPHTHALMOLOGY EXAM  06/16/2023   DTaP/Tdap/Td (3 - Td or Tdap) 08/04/2023   Diabetic kidney evaluation - eGFR measurement  08/12/2023   Colonoscopy  09/07/2023   MAMMOGRAM  06/03/2024   Hepatitis C Screening  Completed   HPV VACCINES  Aged Out     Patient Care Team: Lucky Cowboy, MD as PCP - General (Internal Medicine) Sharrell Ku, MD as Consulting Physician (Gastroenterology) Nils Flack, MD as Referring Physician (Dermatology) Marisue Brooklyn, DO (Internal Medicine) Valeria Batman, MD (Inactive) as  Consulting Physician (Orthopedic Surgery) Jethro Bolus, MD as Consulting Physician (Ophthalmology) Lewayne Bunting, MD as Consulting Physician (Cardiology) Karie Soda, MD as Consulting Physician (General Surgery) Pyrtle, Carie Caddy, MD as Consulting Physician (Gastroenterology)  Medical History:  Past Medical History:  Diagnosis Date   ADD (attention deficit disorder)    Allergy    Anxiety    Arthritis    Asthma    with severe allergies pt does use albuterol inhaler when needed   Cataract    removed both eyes   Depression    Diabetes mellitus without complication (HCC) 2010   TYPE 2    GERD (gastroesophageal reflux disease)    hx of had Nissen Fundoplication 2001 has not had any issues since   History of hiatal hernia    Hyperlipidemia 2004   Hypertension    IBS (irritable bowel syndrome)    Lynch syndrome    Neuromuscular disorder (HCC)    Fibromyalgia   Tubular adenoma of colon    Allergies Allergies  Allergen Reactions   Tape Rash   Ace Inhibitors Cough   Metformin And Related     myalgia   Morphine And Codeine Itching   Penicillins     Pt states that in 3rd grade she was given an injection in the buttock and reacted with a large hive in that area Pt can take oral penicillin with no reaction     Wellbutrin [Bupropion]     Felt funny    SURGICAL HISTORY She  has a past surgical history that includes Hernia repair; Cholecystectomy; Tubal ligation; Cosmetic surgery; Rotator cuff repair (Right, 04/06/2010); Eye surgery (04/06/2004); Nissen fundoplication (04/07/1999); Carpal tunnel release (1998, 2001); Nasal sinus surgery; colonscopy ; Upper gi endoscopy; Partial proctectomy by tem (N/A, 07/19/2014); and Polypectomy.   FAMILY HISTORY Her family history includes Breast cancer in her mother and sister; Cancer in her mother and paternal grandmother; Colon cancer (age of onset: 71) in her brother; Colon cancer (age of onset: 69) in her paternal grandfather and another  family  member; Colon cancer (age of onset: 69) in her father; Colon polyps in her brother, father, paternal grandfather, and another family member; Diabetes in her mother; Hyperlipidemia in her father and mother; Hypertension in her father and mother; Prostate cancer in her father; Skin cancer in her father.   SOCIAL HISTORY She  reports that she has never smoked. She has never used smokeless tobacco. She reports that she does not drink alcohol and does not use drugs.  Review of Systems: Review of Systems  Constitutional:  Positive for malaise/fatigue (daytime somnolence). Negative for chills and fever.  HENT:  Negative for congestion, hearing loss, sinus pain, sore throat and tinnitus.   Eyes:  Negative for blurred vision and double vision.  Respiratory:  Negative for cough, hemoptysis, sputum production, shortness of breath and wheezing.        Snoring  Cardiovascular:  Negative for chest pain, palpitations and leg swelling.  Gastrointestinal:  Negative for abdominal pain, constipation, diarrhea, heartburn, nausea and vomiting.  Genitourinary:  Negative for dysuria and urgency.  Musculoskeletal:  Positive for back pain and joint pain (right shoulder, hips). Negative for falls, myalgias and neck pain.  Skin:  Negative for rash.  Neurological:  Negative for dizziness, tingling, tremors, weakness and headaches.  Endo/Heme/Allergies:  Does not bruise/bleed easily.  Psychiatric/Behavioral:  Negative for depression and suicidal ideas. The patient has insomnia. The patient is not nervous/anxious.     Physical Exam: Estimated body mass index is 25.82 kg/m as calculated from the following:   Height as of this encounter: 5\' 4"  (1.626 m).   Weight as of this encounter: 150 lb 6.4 oz (68.2 kg). BP 130/72   Pulse 90   Temp 97.9 F (36.6 C)   Ht 5\' 4"  (1.626 m)   Wt 150 lb 6.4 oz (68.2 kg)   LMP  (LMP Unknown)   SpO2 95%   BMI 25.82 kg/m    General Appearance: Well nourished, in no apparent  distress.  Eyes: PERRLA, EOMs, conjunctiva no swelling or erythema  Sinuses: No Frontal/maxillary tenderness  ENT/Mouth: Ext aud canals clear, normal light reflex with TMs without erythema, bulging.  No erythema, swelling, or exudate on post pharynx.  Hearing normal.  Neck: Supple, thyroid normal. No bruits  Respiratory: Respiratory effort normal, BS equal bilaterally without rales, rhonchi, wheezing or stridor.  Cardio: RRR without murmurs, rubs or gallops. Brisk peripheral pulses without edema. Varicose veins L>R lower extremities Chest: symmetric, with normal excursions and percussion.  Breasts: breasts appear normal, no suspicious masses, no skin or nipple changes or axillary nodes.  Abdomen: Soft, + epigastric tenderness, no guarding, rebound, hernias, masses, or organomegaly.  Lymphatics: Non tender without lymphadenopathy.  Pelvic exam:deferred, no issues Musculoskeletal: Decreased ROM of right shoulder, strength decreased, tender to palpation of anterior right shoulder. Decreased ROM right hip. Strength 5/5 of left UE and bilateral LE  Skin:warm, dry no lesions or rashes Neuro: Cranial nerves intact, reflexes equal bilaterally. Normal muscle tone, no cerebellar symptoms. Sensation intact to monofilament Psych: Awake and oriented X 3, normal affect, Insight and Judgment appropriate.   EKG: NSR, no ST changes AAA: < 3 cm   [facility]  12/01/2022

## 2022-12-01 ENCOUNTER — Encounter: Payer: Self-pay | Admitting: Nurse Practitioner

## 2022-12-01 ENCOUNTER — Ambulatory Visit (INDEPENDENT_AMBULATORY_CARE_PROVIDER_SITE_OTHER): Payer: Medicare HMO | Admitting: Nurse Practitioner

## 2022-12-01 VITALS — BP 130/72 | HR 90 | Temp 97.9°F | Ht 64.0 in | Wt 150.4 lb

## 2022-12-01 DIAGNOSIS — F419 Anxiety disorder, unspecified: Secondary | ICD-10-CM

## 2022-12-01 DIAGNOSIS — R400 Somnolence: Secondary | ICD-10-CM

## 2022-12-01 DIAGNOSIS — E559 Vitamin D deficiency, unspecified: Secondary | ICD-10-CM

## 2022-12-01 DIAGNOSIS — E1122 Type 2 diabetes mellitus with diabetic chronic kidney disease: Secondary | ICD-10-CM

## 2022-12-01 DIAGNOSIS — I700 Atherosclerosis of aorta: Secondary | ICD-10-CM | POA: Diagnosis not present

## 2022-12-01 DIAGNOSIS — F5101 Primary insomnia: Secondary | ICD-10-CM

## 2022-12-01 DIAGNOSIS — F988 Other specified behavioral and emotional disorders with onset usually occurring in childhood and adolescence: Secondary | ICD-10-CM

## 2022-12-01 DIAGNOSIS — I10 Essential (primary) hypertension: Secondary | ICD-10-CM | POA: Diagnosis not present

## 2022-12-01 DIAGNOSIS — R0683 Snoring: Secondary | ICD-10-CM

## 2022-12-01 DIAGNOSIS — I8393 Asymptomatic varicose veins of bilateral lower extremities: Secondary | ICD-10-CM

## 2022-12-01 DIAGNOSIS — E663 Overweight: Secondary | ICD-10-CM

## 2022-12-01 DIAGNOSIS — E2839 Other primary ovarian failure: Secondary | ICD-10-CM

## 2022-12-01 DIAGNOSIS — N182 Chronic kidney disease, stage 2 (mild): Secondary | ICD-10-CM

## 2022-12-01 DIAGNOSIS — D128 Benign neoplasm of rectum: Secondary | ICD-10-CM

## 2022-12-01 DIAGNOSIS — E1169 Type 2 diabetes mellitus with other specified complication: Secondary | ICD-10-CM

## 2022-12-01 DIAGNOSIS — Z806 Family history of leukemia: Secondary | ICD-10-CM

## 2022-12-01 DIAGNOSIS — Z136 Encounter for screening for cardiovascular disorders: Secondary | ICD-10-CM | POA: Diagnosis not present

## 2022-12-01 DIAGNOSIS — F330 Major depressive disorder, recurrent, mild: Secondary | ICD-10-CM

## 2022-12-01 DIAGNOSIS — E785 Hyperlipidemia, unspecified: Secondary | ICD-10-CM

## 2022-12-01 DIAGNOSIS — E1140 Type 2 diabetes mellitus with diabetic neuropathy, unspecified: Secondary | ICD-10-CM

## 2022-12-01 DIAGNOSIS — Z1389 Encounter for screening for other disorder: Secondary | ICD-10-CM

## 2022-12-01 DIAGNOSIS — Z1329 Encounter for screening for other suspected endocrine disorder: Secondary | ICD-10-CM

## 2022-12-01 DIAGNOSIS — Z1509 Genetic susceptibility to other malignant neoplasm: Secondary | ICD-10-CM

## 2022-12-01 DIAGNOSIS — Z79899 Other long term (current) drug therapy: Secondary | ICD-10-CM

## 2022-12-01 DIAGNOSIS — Z0000 Encounter for general adult medical examination without abnormal findings: Secondary | ICD-10-CM

## 2022-12-01 DIAGNOSIS — G8929 Other chronic pain: Secondary | ICD-10-CM

## 2022-12-01 NOTE — Patient Instructions (Signed)

## 2022-12-02 LAB — CBC WITH DIFFERENTIAL/PLATELET
Absolute Monocytes: 481 {cells}/uL (ref 200–950)
Basophils Absolute: 108 cells/uL (ref 0–200)
Basophils Relative: 1.3 %
Eosinophils Absolute: 324 {cells}/uL (ref 15–500)
Eosinophils Relative: 3.9 %
HCT: 42.8 % (ref 35.0–45.0)
Hemoglobin: 13.8 g/dL (ref 11.7–15.5)
Lymphs Abs: 2930 {cells}/uL (ref 850–3900)
MCH: 27.6 pg (ref 27.0–33.0)
MCHC: 32.2 g/dL (ref 32.0–36.0)
MCV: 85.6 fL (ref 80.0–100.0)
MPV: 11.3 fL (ref 7.5–12.5)
Monocytes Relative: 5.8 %
Neutro Abs: 4457 {cells}/uL (ref 1500–7800)
Neutrophils Relative %: 53.7 %
Platelets: 310 10*3/uL (ref 140–400)
RBC: 5 10*6/uL (ref 3.80–5.10)
RDW: 12.9 % (ref 11.0–15.0)
Total Lymphocyte: 35.3 %
WBC: 8.3 10*3/uL (ref 3.8–10.8)

## 2022-12-02 LAB — URINALYSIS, ROUTINE W REFLEX MICROSCOPIC
Bacteria, UA: NONE SEEN /HPF
Bilirubin Urine: NEGATIVE
Hgb urine dipstick: NEGATIVE
Hyaline Cast: NONE SEEN /LPF
Ketones, ur: NEGATIVE
Nitrite: NEGATIVE
RBC / HPF: NONE SEEN /HPF (ref 0–2)
Specific Gravity, Urine: 1.033 (ref 1.001–1.035)
pH: 6 (ref 5.0–8.0)

## 2022-12-02 LAB — MICROALBUMIN / CREATININE URINE RATIO
Creatinine, Urine: 197 mg/dL (ref 20–275)
Microalb Creat Ratio: 4 mg/g{creat} (ref <30)
Microalb, Ur: 0.7 mg/dL

## 2022-12-02 LAB — COMPLETE METABOLIC PANEL WITH GFR
AG Ratio: 1.5 (calc) (ref 1.0–2.5)
ALT: 15 U/L (ref 6–29)
AST: 13 U/L (ref 10–35)
Albumin: 4 g/dL (ref 3.6–5.1)
Alkaline phosphatase (APISO): 90 U/L (ref 37–153)
BUN: 14 mg/dL (ref 7–25)
CO2: 29 mmol/L (ref 20–32)
Calcium: 9.7 mg/dL (ref 8.6–10.4)
Chloride: 102 mmol/L (ref 98–110)
Creat: 0.7 mg/dL (ref 0.50–1.05)
Globulin: 2.6 g/dL (ref 1.9–3.7)
Glucose, Bld: 295 mg/dL — ABNORMAL HIGH (ref 65–99)
Potassium: 4.1 mmol/L (ref 3.5–5.3)
Sodium: 138 mmol/L (ref 135–146)
Total Bilirubin: 0.8 mg/dL (ref 0.2–1.2)
Total Protein: 6.6 g/dL (ref 6.1–8.1)
eGFR: 95 mL/min/{1.73_m2} (ref >=60)

## 2022-12-02 LAB — LIPID PANEL
Cholesterol: 132 mg/dL (ref <200)
HDL: 51 mg/dL (ref >=50)
LDL Cholesterol (Calc): 60 mg/dL
Non-HDL Cholesterol (Calc): 81 mg/dL (ref <130)
Total CHOL/HDL Ratio: 2.6 (calc) (ref <5.0)
Triglycerides: 119 mg/dL (ref <150)

## 2022-12-02 LAB — HEMOGLOBIN A1C W/OUT EAG: Hgb A1c MFr Bld: 9.9 %{Hb} — ABNORMAL HIGH (ref <5.7)

## 2022-12-02 LAB — MICROSCOPIC MESSAGE

## 2022-12-02 LAB — VITAMIN D 25 HYDROXY (VIT D DEFICIENCY, FRACTURES): Vit D, 25-Hydroxy: 50 ng/mL (ref 30–100)

## 2022-12-02 LAB — MAGNESIUM: Magnesium: 1.8 mg/dL (ref 1.5–2.5)

## 2022-12-02 LAB — TSH: TSH: 3.18 m[IU]/L (ref 0.40–4.50)

## 2022-12-16 ENCOUNTER — Ambulatory Visit (INDEPENDENT_AMBULATORY_CARE_PROVIDER_SITE_OTHER): Payer: Medicare HMO | Admitting: Nurse Practitioner

## 2022-12-16 ENCOUNTER — Encounter: Payer: Self-pay | Admitting: Nurse Practitioner

## 2022-12-16 ENCOUNTER — Other Ambulatory Visit: Payer: Self-pay

## 2022-12-16 VITALS — BP 140/80 | HR 87 | Temp 98.2°F | Ht 64.0 in | Wt 147.0 lb

## 2022-12-16 DIAGNOSIS — J0110 Acute frontal sinusitis, unspecified: Secondary | ICD-10-CM

## 2022-12-16 DIAGNOSIS — Z1152 Encounter for screening for COVID-19: Secondary | ICD-10-CM | POA: Diagnosis not present

## 2022-12-16 DIAGNOSIS — R051 Acute cough: Secondary | ICD-10-CM

## 2022-12-16 DIAGNOSIS — R6889 Other general symptoms and signs: Secondary | ICD-10-CM

## 2022-12-16 LAB — POC COVID19 BINAXNOW: SARS Coronavirus 2 Ag: NEGATIVE

## 2022-12-16 LAB — POCT INFLUENZA A/B
Influenza A, POC: NEGATIVE
Influenza B, POC: NEGATIVE

## 2022-12-16 MED ORDER — AZITHROMYCIN 250 MG PO TABS
ORAL_TABLET | ORAL | 1 refills | Status: DC
Start: 2022-12-16 — End: 2023-03-06

## 2022-12-16 MED ORDER — PREDNISONE 10 MG PO TABS: ORAL_TABLET | ORAL | 0 refills | Status: DC

## 2022-12-16 MED ORDER — PROMETHAZINE-DM 6.25-15 MG/5ML PO SYRP
5.0000 mL | ORAL_SOLUTION | Freq: Four times a day (QID) | ORAL | 0 refills | Status: DC | PRN
Start: 2022-12-16 — End: 2023-03-06

## 2022-12-16 NOTE — Patient Instructions (Signed)

## 2022-12-16 NOTE — Progress Notes (Signed)
Assessment and Plan:  Sydney Dickson was seen today for an episodic visit.  Diagnoses and all order for this visit:  Encounter for screening for COVID-19 Negative  - POC COVID-19  Flu-like symptoms Negative  - POCT Influenza A/B  Acute non-recurrent frontal sinusitis Start tmt with Z-Pak Start steroid taper Stay well hydrated to  keep mucus thin and productive.  - azithromycin (ZITHROMAX) 250 MG tablet; Take 2 tablets on  Day 1,  followed by 1 tablet  daily for 4 more days    for Sinusitis  /Bronchitis  Dispense: 6 each; Refill: 1 - predniSONE (DELTASONE) 10 MG tablet; 1 tab 3 x day for 2 days, then 1 tab 2 x day for 2 days, then 1 tab 1 x day for 3 days  Dispense: 13 tablet; Refill: 0  Acute cough Continue Xyzal Start Promethazine cough syrup as needed Stay well hydrated to keep mucus thin and productive.  - promethazine-dextromethorphan (PROMETHAZINE-DM) 6.25-15 MG/5ML syrup; Take 5 mLs by mouth 4 (four) times daily as needed for cough.  Dispense: 240 mL; Refill: 0   Notify office for further evaluation and treatment, questions or concerns if s/s fail to improve. The risks and benefits of my recommendations, as well as other treatment options were discussed with the patient today. Questions were answered.  Further disposition pending results of labs. Discussed med's effects and SE's.    Over 20 minutes of exam, counseling, chart review, and critical decision making was performed.   Future Appointments  Date Time Provider Department Center  03/08/2023  9:30 AM Lucky Cowboy, MD [facility] None  05/06/2023 11:00 AM Adela Glimpse, NP [facility] None  12/01/2023  2:00 PM Raynelle Dick, NP [facility] None    ------------------------------------------------------------------------------------------------------------------   HPI BP (!) 140/80   Pulse 87   Temp 98.2 F (36.8 C)   Ht 5\' 4"  (1.626 m)   Wt 147 lb (66.7 kg)   LMP  (LMP Unknown)   SpO2 98%   BMI  25.23 kg/m    Patient complains of symptoms of a URI. Symptoms include congestion, cough described as productive, headache described as throbbing, and sore throat. Onset of symptoms was 3 days ago, and has been unchanged since that time. Treatment to date: antihistamines and cough suppressants.  She tested positive for Covid x 2 weeks ago.     Past Medical History:  Diagnosis Date   ADD (attention deficit disorder)    Allergy    Anxiety    Arthritis    Asthma    with severe allergies pt does use albuterol inhaler when needed   Cataract    removed both eyes   Depression    Diabetes mellitus without complication (HCC) 2010   TYPE 2    GERD (gastroesophageal reflux disease)    hx of had Nissen Fundoplication 2001 has not had any issues since   History of hiatal hernia    Hyperlipidemia 2004   Hypertension    IBS (irritable bowel syndrome)    Lynch syndrome    Neuromuscular disorder (HCC)    Fibromyalgia   Tubular adenoma of colon      Allergies  Allergen Reactions   Tape Rash   Ace Inhibitors Cough   Metformin And Related     myalgia   Morphine And Codeine Itching   Penicillins     Pt states that in 3rd grade she was given an injection in the buttock and reacted with a large hive in that area Pt can  take oral penicillin with no reaction     Wellbutrin [Bupropion]     Felt funny    Current Outpatient Medications on File Prior to Visit  Medication Sig   acetaminophen (TYLENOL) 500 MG tablet Take 1,000 mg by mouth daily as needed for moderate pain or headache.   ALPRAZolam (XANAX) 0.5 MG tablet Take 1 tablet (0.5 mg total) by mouth 3 (three) times daily as needed.   aspirin 81 MG tablet Take 81 mg by mouth daily.   CHOLECALCIFEROL PO Take 5,000 Units by mouth daily.   Cyanocobalamin (B-12 PO) Take by mouth.   escitalopram (LEXAPRO) 20 MG tablet TAKE 1 TABLET BY MOUTH DAILY FOR MOOD   fluticasone (FLONASE) 50 MCG/ACT nasal spray Place 2 sprays into both nostrils  daily.   glucose blood (ONETOUCH VERIO) test strip Use as instructedCHECK BLOOD SUGAR 3 TIMES DAILY-DX-E11.65   Insulin Syringe-Needle U-100 (BD INSULIN SYRINGE U/F) 31G X 5/16" 1 ML MISC USE AS DIRECTED TWICE A DAY   ipratropium (ATROVENT) 0.06 % nasal spray Place 2 sprays into the nose 3 (three) times daily.   Lancets (ONETOUCH ULTRASOFT) lancets Use 3 lancets daily to check blood sugar-DX-E11.65   levocetirizine (XYZAL) 5 MG tablet TAKE 1 TAB BY MOUTH DAILY FOR ALLERGIES   lisdexamfetamine (VYVANSE) 20 MG capsule Take 1 tab daily in the morning as needed, avoid taking daily due to risk for tolerance. 30 tabs should last longer than a month.   losartan (COZAAR) 100 MG tablet Take 1 tablet Daily for BP & Diabetic Kidney Protection   Na Sulfate-K Sulfate-Mg Sulf 17.5-3.13-1.6 GM/177ML SOLN See Admin Instructions. PLEASE SEE ATTACHED FOR DETAILED DIRECTIONS   NOVOLIN 70/30 (70-30) 100 UNIT/ML injection INJECT 25 UNITS SUBCUTANEOUSLY IN THE MORNING AND 20 UNITS AT DINNER.   ondansetron (ZOFRAN) 4 MG tablet Take one tablet every 6-8 hours as needed for nausea.   rosuvastatin (CRESTOR) 20 MG tablet TAKE 1 TABLET BY MOUTH EVERY DAY FOR CHOLESTEROL   tirzepatide (MOUNJARO) 10 MG/0.5ML Pen INJECT 1 PEN ( 10 MG ) INTO SKIN EVERY 7 DAYS ( DX: E11.29)   No current facility-administered medications on file prior to visit.    ROS: all negative except what is noted in the HPI.   Physical Exam:  BP (!) 140/80   Pulse 87   Temp 98.2 F (36.8 C)   Ht 5\' 4"  (1.626 m)   Wt 147 lb (66.7 kg)   LMP  (LMP Unknown)   SpO2 98%   BMI 25.23 kg/m   General Appearance: NAD.  Awake, conversant and cooperative. Eyes: PERRLA, EOMs intact.  Sclera white.  Conjunctiva without erythema. Sinuses: Frontal/maxillary tenderness.  + nasal discharge. Nares not patent.  ENT/Mouth: Ext aud canals clear.  Bilateral TMs w/DOL and without erythema or bulging. Hearing intact.  Posterior pharynx without swelling or exudate.   Tonsils without swelling or erythema.  Neck: Supple.  No masses, nodules or thyromegaly. Respiratory: Effort is regular with non-labored breathing. Breath sounds are equal bilaterally without rales, rhonchi, wheezing or stridor.  Cardio: RRR with no MRGs. Brisk peripheral pulses without edema.  Abdomen: Active BS in all four quadrants.  Soft and non-tender without guarding, rebound tenderness, hernias or masses. Lymphatics: Non tender without lymphadenopathy.  Musculoskeletal: Full ROM, 5/5 strength, normal ambulation.  No clubbing or cyanosis. Skin: Appropriate color for ethnicity. Warm without rashes, lesions, ecchymosis, ulcers.  Neuro: CN II-XII grossly normal. Normal muscle tone without cerebellar symptoms and intact sensation.   Psych: AO X  3,  appropriate mood and affect, insight and judgment.     Adela Glimpse, NP 10:23 AM [facility]

## 2022-12-23 LAB — HM DEXA SCAN

## 2022-12-26 ENCOUNTER — Other Ambulatory Visit: Payer: Self-pay | Admitting: Nurse Practitioner

## 2022-12-26 DIAGNOSIS — R059 Cough, unspecified: Secondary | ICD-10-CM

## 2022-12-29 ENCOUNTER — Encounter: Payer: Self-pay | Admitting: Internal Medicine

## 2023-01-25 ENCOUNTER — Other Ambulatory Visit: Payer: Self-pay | Admitting: Nurse Practitioner

## 2023-01-25 DIAGNOSIS — F419 Anxiety disorder, unspecified: Secondary | ICD-10-CM

## 2023-01-25 DIAGNOSIS — F900 Attention-deficit hyperactivity disorder, predominantly inattentive type: Secondary | ICD-10-CM

## 2023-01-25 DIAGNOSIS — F988 Other specified behavioral and emotional disorders with onset usually occurring in childhood and adolescence: Secondary | ICD-10-CM

## 2023-01-25 MED ORDER — LISDEXAMFETAMINE DIMESYLATE 20 MG PO CAPS: ORAL_CAPSULE | ORAL | 0 refills | Status: DC

## 2023-01-25 MED ORDER — ALPRAZOLAM 0.5 MG PO TABS: ORAL_TABLET | ORAL | 0 refills | Status: DC

## 2023-03-06 ENCOUNTER — Encounter: Payer: Self-pay | Admitting: Internal Medicine

## 2023-03-06 NOTE — Patient Instructions (Signed)

## 2023-03-06 NOTE — Progress Notes (Unsigned)
[place]     [facility]     INTERNAL MEDICINE  Lucky Cowboy, M.D.          Rance Muir, A.NP        Adela Glimpse, F.NP  [facility] [address]  [place], [place]. [address] Telephone [phone] Telefax [phone]  Future Appointments  Date Time Provider Department  03/08/2023                   3 mo ov  9:30 AM Lucky Cowboy, MD [facility]  05/06/2023                   wellness 11:00 AM Adela Glimpse, NP [facility]  12/01/2023                   cpe  2:00 PM Raynelle Dick, NP [facility]    History of Present Illness:       This very nice 67 y.o. MWF  presents for 3 month follow up with HTN, HLD, Insulin Dependent T2_DM, Lynch Syndrome and Vitamin D Deficiency.         Patient is treated for HTN  (2014)  & BP has been controlled at home. Today's BP was initially elevated & rechecked at goal - 138/84 .   Patient has had no complaints of any cardiac type chest pain, palpitations, dyspnea Pollyann Kennedy /PND, dizziness, claudication or dependent edema.        Hyperlipidemia is controlled with diet & meds. Patient denies myalgias or other med SE's. Last Lipids were at goal  :  Lab Results  Component Value Date   CHOL 132 12/01/2022   HDL 51 12/01/2022   LDLCALC 60 12/01/2022   TRIG 119 12/01/2022   CHOLHDL 2.6 12/01/2022     Also, the patient has history of T2_NIDDM  (6.7% /2014) started on Metformin and transitioned to Insulin in 2020.   Patient had been on Ozempic & was switched to [facility] in July 2023 .  Patient admits that she has not lost any significant amount of weight  & desires to increase her Mounjaro from 5 to 7.5 mg in 3 weeks when due for a refill .  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 She has had no symptoms of reactive hypoglycemia, diabetic polys, paresthesias or visual blurring.  Last A1c was not at goal :  Lab Results  Component Value Date   HGBA1C 9.9 (H) 12/01/2022   Wt Readings from Last 3 Encounters:   03/08/23 140 lb 3.2 oz (63.6 kg)  12/16/22 147 lb (66.7 kg)  12/01/22 150 lb 6.4 oz (68.2 kg)                                                           Further, the patient also has history of Vitamin D Deficiency ("36" /2015) and supplements vitamin D . Last vitamin D was still low :   Lab Results  Component Value Date   VD25OH 57 12/01/2022       Current Outpatient Medications  Medication Instructions   acetaminophen  1,000 mg Daily PRN   ALPRAZolam (XANAX) 0.5 MG Take 1/2-1 tab as needed for sleep limit to 5 days a week to prevent addicition   aspirin 81 mg Daily   Vitamin  D  5,000 Units Daily   B-12 tabs    escitalopram (LEXAPRO) 20 MG TAKE 1 TABLET  DAILY    FLONASE  nasal spray 2 sprays Each Nare, Daily   ipratropium  0.06 % nasal spray SPRAY 2 SPRAYS INTO EACH NOSTRIL 3 TIMES A DAY   XYZAL 5 MG tablet TAKE 1 TAB DAILY FOR ALLERGIES   VYVANSE  20 MG capsule Take 1 tab daily in the morning as needed   losartan 100 MG Take 1 tablet Daily   NOVOLIN 70/30 INJECT 25 u am &  20 u pm   ondansetron  4 MG tablet Take one tablet every 6-8 hours as needed    rosuvastatin 20 MG tablet TAKE 1 TABLET  EVERY DAY    MOUNJARO 10 MG INJECT 1 PEN / 10 MG EVERY 7 DAYS      Allergies  Allergen Reactions   Tape Rash   Ace Inhibitors Cough   Metformin And Related     myalgia   Morphine And Related Itching   Penicillins     Pt can take oral penicillin with no reaction    Wellbutrin [Bupropion]     Felt funny     PMHx:   Past Medical History:  Diagnosis Date   ADD (attention deficit disorder)    Allergy    Anxiety    Arthritis    Asthma    with severe allergies pt does use albuterol inhaler when needed   Cataract    removed both eyes   Depression    Diabetes mellitus without complication (HCC) 2010   TYPE 2    GERD (gastroesophageal reflux disease)    hx of had Nissen Fundoplication 2001 has not had any issues since   History of hiatal hernia    Hyperlipidemia 2004    Hypertension    IBS (irritable bowel syndrome)    Lynch syndrome    Neuromuscular disorder (HCC)    Fibromyalgia   Tubular adenoma of colon      Immunization History  Administered Date(s) Administered   Influenza Inj Mdck Quad  12/30/2017, 01/16/2019, 01/08/2020   Influenza Split 03/08/2014, 01/17/2015   Influenza 01/08/2016   Influenza 03/20/2021   PPD Test 12/28/2013   Pneumococcal - 23 04/22/2015   Td 10/21/2005   Tdap 08/03/2013   Colonoscopy- 09/07/22 - Dr Rhea Belton- 3 polyps - Recc F/U 1 year  - due June 2025   Past Surgical History:  Procedure Laterality Date   CARPAL TUNNEL RELEASE  1998, 2001   CHOLECYSTECTOMY     colonscopy      COSMETIC SURGERY     rhinoplasty   EYE SURGERY  04/06/2004   lasik   HERNIA REPAIR     NASAL SINUS SURGERY     NISSEN FUNDOPLICATION  04/07/1999   PARTIAL PROCTECTOMY BY TEM N/A 07/19/2014   Procedure: PARTIAL PROCTECTOMY BY TEM;  Surgeon: Karie Soda, MD;  Location: [facility];  Service: General;  Laterality: N/A;   POLYPECTOMY     ROTATOR CUFF REPAIR Right 04/06/2010   TUBAL LIGATION     UPPER GI ENDOSCOPY      FHx:    Reviewed / unchanged  SHx:    Reviewed / unchanged   Systems Review:  Constitutional: Denies fever, chills, wt changes, headaches, insomnia, fatigue, night sweats, change in appetite. Eyes: Denies redness, blurred vision, diplopia, discharge, itchy, watery eyes.  ENT: Denies discharge, congestion, post nasal drip, epistaxis, sore throat, earache, hearing loss, dental pain, tinnitus, vertigo,  sinus pain, snoring.  CV: Denies chest pain, palpitations, irregular heartbeat, syncope, dyspnea, diaphoresis, orthopnea, PND, claudication or edema. Respiratory: denies cough, dyspnea, DOE, pleurisy, hoarseness, laryngitis, wheezing.  Gastrointestinal: Denies dysphagia, odynophagia, heartburn, reflux, water brash, abdominal pain or cramps, nausea, vomiting, bloating, diarrhea, constipation, hematemesis, melena, hematochezia  or  hemorrhoids. Genitourinary: Denies dysuria, frequency, urgency, nocturia, hesitancy, discharge, hematuria or flank pain. Musculoskeletal: Denies arthralgias, myalgias, stiffness, jt. swelling, pain, limping or strain/sprain.  Skin: Denies pruritus, rash, hives, warts, acne, eczema or change in skin lesion(s). Neuro: No weakness, tremor, incoordination, spasms, paresthesia or pain. Psychiatric: Denies confusion, memory loss or sensory loss. Endo: Denies change in weight, skin or hair change.  Heme/Lymph: No excessive bleeding, bruising or enlarged lymph nodes.  Physical Exam  BP 138/84   Pulse 80   Temp (!) 97.4 F (36.3 C)   Ht 5\' 4"  (1.626 m)   Wt 140 lb 3.2 oz (63.6 kg)   LMP  (LMP Unknown)   SpO2 98%   BMI 24.07 kg/m   Appears  well nourished, well groomed  and in no distress.  Eyes: PERRLA, EOMs, conjunctiva no swelling or erythema. Sinuses: No frontal/maxillary tenderness ENT/Mouth: EAC's clear, TM's nl w/o erythema, bulging. Nares clear w/o erythema, swelling, exudates. Oropharynx clear without erythema or exudates. Oral hygiene is good. Tongue normal, non obstructing. Hearing intact.  Neck: Supple. Thyroid not palpable. Car 2+/2+ without bruits, nodes or JVD. Chest: Respirations nl with BS clear & equal w/o rales, rhonchi, wheezing or stridor.  Cor: Heart sounds normal w/ regular rate and rhythm without sig. murmurs, gallops, clicks or rubs. Peripheral pulses normal and equal  without edema.  Abdomen: Soft & bowel sounds normal. Non-tender w/o guarding, rebound, hernias, masses or organomegaly.  Lymphatics: Unremarkable.  Musculoskeletal: Full ROM all peripheral extremities, joint stability, 5/5 strength and normal gait.  Skin: Warm, dry without exposed rashes, lesions or ecchymosis apparent.  Neuro: Cranial nerves intact, reflexes equal bilaterally. Sensory-motor testing grossly intact. Tendon reflexes grossly intact.  Pysch: Alert & oriented x 3.  Insight and judgement nl  & appropriate. No ideations.  Assessment and Plan:  1. Essential hypertension  - Continue medication, monitor blood pressure at home.  - Continue DASH diet.  Reminder to go to the ER if any CP,     SOB, nausea, dizziness, severe HA, changes vision/speech.   - CBC with Differential/Platelet - COMPLETE METABOLIC PANEL WITH GFR - Magnesium - TSH   2. Hyperlipidemia associated with type 2 diabetes mellitus (HCC)  - Continue diet/meds, exercise,& lifestyle modifications.  - Continue monitor periodic cholesterol/liver & renal functions    - Lipid panel - TSH   3. Type 2 diabetes mellitus with stage 2 chronic kidney                                          disease, with long-term current use of insulin (HCC)  - Continue diet, exercise  - Lifestyle modifications.  - Monitor appropriate labs   - Hemoglobin A1c   4. Vitamin D deficiency  - Continue supplementation   - VITAMIN D 25 Hydroxy    5. Medication management  - CBC with Differential/Platelet - COMPLETE METABOLIC PANEL WITH GFR - Magnesium - Lipid panel - TSH - Hemoglobin A1c - VITAMIN D 25 Hydroxy        Discussed  regular exercise, BP monitoring, weight control to achieve/maintain BMI  less than 25 and discussed med and SE's. Recommended labs to assess /monitor clinical status .  I discussed the assessment and treatment plan with the patient. The patient was provided an opportunity to ask questions and all were answered. The patient agreed with the plan and demonstrated an understanding of the instructions.  I provided over 30 minutes of exam, counseling, chart review and  complex critical decision making.        The patient was advised to call back or seek an in-person evaluation if the symptoms worsen or if the condition fails to improve as anticipated.   Marinus Maw, MD

## 2023-03-08 ENCOUNTER — Ambulatory Visit (INDEPENDENT_AMBULATORY_CARE_PROVIDER_SITE_OTHER): Payer: Medicare HMO | Admitting: Internal Medicine

## 2023-03-08 ENCOUNTER — Encounter: Payer: Self-pay | Admitting: Internal Medicine

## 2023-03-08 VITALS — BP 138/84 | HR 80 | Temp 97.4°F | Ht 64.0 in | Wt 140.2 lb

## 2023-03-08 DIAGNOSIS — E1122 Type 2 diabetes mellitus with diabetic chronic kidney disease: Secondary | ICD-10-CM

## 2023-03-08 DIAGNOSIS — E1169 Type 2 diabetes mellitus with other specified complication: Secondary | ICD-10-CM | POA: Diagnosis not present

## 2023-03-08 DIAGNOSIS — E559 Vitamin D deficiency, unspecified: Secondary | ICD-10-CM

## 2023-03-08 DIAGNOSIS — I10 Essential (primary) hypertension: Secondary | ICD-10-CM

## 2023-03-08 DIAGNOSIS — Z79899 Other long term (current) drug therapy: Secondary | ICD-10-CM

## 2023-03-08 DIAGNOSIS — N182 Chronic kidney disease, stage 2 (mild): Secondary | ICD-10-CM

## 2023-03-08 DIAGNOSIS — E785 Hyperlipidemia, unspecified: Secondary | ICD-10-CM

## 2023-03-08 DIAGNOSIS — Z794 Long term (current) use of insulin: Secondary | ICD-10-CM

## 2023-03-09 ENCOUNTER — Other Ambulatory Visit: Payer: Self-pay | Admitting: Internal Medicine

## 2023-03-09 LAB — COMPLETE METABOLIC PANEL WITH GFR
AG Ratio: 1.5 (calc) (ref 1.0–2.5)
ALT: 37 U/L — ABNORMAL HIGH (ref 6–29)
AST: 34 U/L (ref 10–35)
Albumin: 4.1 g/dL (ref 3.6–5.1)
Alkaline phosphatase (APISO): 92 U/L (ref 37–153)
BUN: 14 mg/dL (ref 7–25)
CO2: 29 mmol/L (ref 20–32)
Calcium: 9.7 mg/dL (ref 8.6–10.4)
Chloride: 100 mmol/L (ref 98–110)
Creat: 0.81 mg/dL (ref 0.50–1.05)
Globulin: 2.7 g/dL (ref 1.9–3.7)
Glucose, Bld: 341 mg/dL — ABNORMAL HIGH (ref 65–99)
Potassium: 3.8 mmol/L (ref 3.5–5.3)
Sodium: 138 mmol/L (ref 135–146)
Total Bilirubin: 0.8 mg/dL (ref 0.2–1.2)
Total Protein: 6.8 g/dL (ref 6.1–8.1)
eGFR: 80 mL/min/{1.73_m2} (ref >=60)

## 2023-03-09 LAB — CBC WITH DIFFERENTIAL/PLATELET
Absolute Lymphocytes: 2574 {cells}/uL (ref 850–3900)
Absolute Monocytes: 284 {cells}/uL (ref 200–950)
Basophils Absolute: 112 {cells}/uL (ref 0–200)
Basophils Relative: 1.7 %
Eosinophils Absolute: 178 {cells}/uL (ref 15–500)
Eosinophils Relative: 2.7 %
HCT: 46 % — ABNORMAL HIGH (ref 35.0–45.0)
Hemoglobin: 14.4 g/dL (ref 11.7–15.5)
MCH: 27.6 pg (ref 27.0–33.0)
MCHC: 31.3 g/dL — ABNORMAL LOW (ref 32.0–36.0)
MCV: 88.1 fL (ref 80.0–100.0)
MPV: 11.8 fL (ref 7.5–12.5)
Monocytes Relative: 4.3 %
Neutro Abs: 3452 {cells}/uL (ref 1500–7800)
Neutrophils Relative %: 52.3 %
Platelets: 330 10*3/uL (ref 140–400)
RBC: 5.22 10*6/uL — ABNORMAL HIGH (ref 3.80–5.10)
RDW: 12.1 % (ref 11.0–15.0)
Total Lymphocyte: 39 %
WBC: 6.6 10*3/uL (ref 3.8–10.8)

## 2023-03-09 LAB — TSH: TSH: 2.95 m[IU]/L (ref 0.40–4.50)

## 2023-03-09 LAB — LIPID PANEL
Cholesterol: 132 mg/dL (ref <200)
HDL: 46 mg/dL — ABNORMAL LOW (ref >=50)
LDL Cholesterol (Calc): 59 mg/dL
Non-HDL Cholesterol (Calc): 86 mg/dL (ref <130)
Total CHOL/HDL Ratio: 2.9 (calc) (ref <5.0)
Triglycerides: 208 mg/dL — ABNORMAL HIGH (ref <150)

## 2023-03-09 LAB — HEMOGLOBIN A1C
Hgb A1c MFr Bld: 10.3 %{Hb} — ABNORMAL HIGH (ref <5.7)
Mean Plasma Glucose: 249 mg/dL
eAG (mmol/L): 13.8 mmol/L

## 2023-03-09 LAB — INSULIN, RANDOM: Insulin: 31.6 u[IU]/mL — ABNORMAL HIGH

## 2023-03-09 LAB — VITAMIN D 25 HYDROXY (VIT D DEFICIENCY, FRACTURES): Vit D, 25-Hydroxy: 68 ng/mL (ref 30–100)

## 2023-03-09 LAB — MAGNESIUM: Magnesium: 1.7 mg/dL (ref 1.5–2.5)

## 2023-03-09 NOTE — Progress Notes (Signed)
[] [] [] [] [] [] [] [] [] [] [] [] [] [] [] [] [] [] [] [] [] [] [] [] [] [] [] [] [] [] [] [] [] [] [] [] [] [] [] [] [] ][] [] [] [] [] [] [] [] [] [] [] [] [] [] [] [] [] [] [] [] [] [] [[] [] [] [] []  [] [] [] [] [] [] [] [] [] [] [] [] [] [] [] [] [] [] [] [] [] [] [] [] [] [] [] [] [] [] [] [] [] [] [] [] [] [] [] [] [] ][] [] [] [] [] [] [] [] [] [] [] [] [] [] [] [] [] [] [] [] [] [] [[] [] [] [] []  -Test results slightly outside the reference range are not unusual. If there is anything important, I will review this with you,  otherwise it is considered normal test values.  If you have further questions,  please do not hesitate to contact me at the office or via My Chart.  [] [] [] [] [] [] [] [] [] [] [] [] [] [] [] [] [] [] [] [] [] [] [] [] [] [] [] [] [] [] [] [] [] [] [] [] [] [] [] [] [] ][] [] [] [] [] [] [] [] [] [] [] [] [] [] [] [] [] [] [] [] [] [] [[] [] [] [] []  [] [] [] [] [] [] [] [] [] [] [] [] [] [] [] [] [] [] [] [] [] [] [] [] [] [] [] [] [] [] [] [] [] [] [] [] [] [] [] [] [] ][] [] [] [] [] [] [] [] [] [] [] [] [] [] [] [] [] [] [] [] [] [] [[] [] [] [] []   -  Glucose = 341 - is potentially very Dangerously                  - I strongly Recommend that you restart your Insulin at previous doses   so that you don't end up in an ICU in Diabetic Coma !   [] [] [] [] [] [] [] [] [] [] [] [] [] [] [] [] [] [] [] [] [] [] [] [] [] [] [] [] [] [] [] [] [] [] [] [] [] [] [] [] [] ][] [] [] [] [] [] [] [] [] [] [] [] [] [] [] [] [] [] [] [] [] [] [[] [] [] [] []   -  A1c = 10.3% is dangerously High  - Ideal or goal is less than 5.7%   - Ignoring your Diabetes   &                    not taking your medicine to control   &                                                                             prevent complications    - Will  NOT  make it disappear or go away by Ignoring it !   [] [] [] [] [] [] [] [] [] [] [] [] [] [] [] [] [] [] [] [] [] [] [] [] [] [] [] [] [] [] [] [] [] [] [] [] [] [] [] [] [] ][] [] [] [] [] [] [] [] [] [] [] [] [] [] [] [] [] [] [] [] [] [] [[] [] [] [] []   -  Chol = 132 and LDL = 59 - Are both Great  !  [] [] [] [] [] [] [] [] [] [] [] [] [] [] [] [] [] [] [] [] [] [] [] [] [] [] [] [] [] [] [] [] [] [] [] [] [] [] [] [] [] ][] [] [] [] [] [] [] [] [] [] [] [] [] [] [] [] [] [] [] [] [] [] [[] [] [] [] []   -  But Triglycerides ( = 208  ) or fats in blood are too high                 (   Ideal or   Goal is less than 150  !  )    - Recommend avoid fried & greasy foods,  sweets / candy,   - Avoid white rice  ([place] or wild rice or Quinoa is OK),   - Avoid white potatoes  (sweet potatoes are OK)   - Avoid anything made from white flour  - bagels, doughnuts, rolls, buns, biscuits, white and   wheat breads, pizza crust and traditional  pasta made of white flour & egg white  - (vegetarian pasta or spinach or wheat pasta is OK).    - Multi-grain bread is OK - like multi-grain flat bread or  sandwich thins.   - Avoid alcohol in excess.   - Exercise is also important.   [] [] [] [] [] [] [] [] [] [] [] [] [] [] [] [] [] [] [] [] [] [] [] [] [] [] [] [] [] [] [] [] [] [] [] [] [] [] [] [] [] ][] [] [] [] [] [] [] [] [] [] [] [] [] [] [] [] [] [] [] [] [] [] [[] [] [] [] []   -  Also  Magnesium= 1.7 is  very  low  - goal is betw 2.0 - 2.5,   - So..............Marland Kitchen  Recommend that you take                                                       Magnesium 500 mg tablet 3 x /day with Meals    - also important to eat lots of  leafy green vegetables - spinach - Kale - collards   - greens - okra - asparagus - broccoli - quinoa - squash - almonds   - black, red, white beans  -  peas - green beans  [] [] [] [] [] [] [] [] [] [] [] [] [] [] [] [] [] [] [] [] [] [] [] [] [] [] [] [] [] [] [] [] [] [] [] [] [] [] [] [] [] ][] [] [] [] [] [] [] [] [] [] [] [] [] [] [] [] [] [] [] [] [] [] [[] [] [] [] []   -  Vitamin D = 68 - Great ! - Please keep dosage same   [] [] [] [] [] [] [] [] [] [] [] [] [] [] [] [] [] [] [] [] [] [] [] [] [] [] [] [] [] [] [] [] [] [] [] [] [] [] [] [] [] ][] [] [] [] [] [] [] [] [] [] [] [] [] [] [] [] [] [] [] [] [] [] [[] [] [] [] []   -  All Else - CBC - Kidneys - Electrolytes - Liver - Magnesium & Thyroid    - all  Normal / OK  [] [] [] [] [] [] [] [] [] [] [] [] [] [] [] [] [] [] [] [] [] [] [] [] [] [] [] [] [] [] [] [] [] [] [] [] [] [] [] [] [] ][] [] [] [] [] [] [] [] [] [] [] [] [] [] [] [] [] [] [] [] [] [] [[] [] [] [] []

## 2023-04-11 ENCOUNTER — Other Ambulatory Visit: Payer: Self-pay | Admitting: Nurse Practitioner

## 2023-04-11 DIAGNOSIS — F330 Major depressive disorder, recurrent, mild: Secondary | ICD-10-CM

## 2023-04-27 ENCOUNTER — Other Ambulatory Visit: Payer: Self-pay | Admitting: Nurse Practitioner

## 2023-04-30 ENCOUNTER — Other Ambulatory Visit: Payer: Self-pay | Admitting: Nurse Practitioner

## 2023-04-30 DIAGNOSIS — R059 Cough, unspecified: Secondary | ICD-10-CM

## 2023-05-03 ENCOUNTER — Other Ambulatory Visit: Payer: Self-pay | Admitting: Nurse Practitioner

## 2023-05-03 DIAGNOSIS — F419 Anxiety disorder, unspecified: Secondary | ICD-10-CM

## 2023-05-03 MED ORDER — ALPRAZOLAM 0.5 MG PO TABS: ORAL_TABLET | ORAL | 0 refills | Status: DC

## 2023-05-04 ENCOUNTER — Telehealth: Payer: Self-pay | Admitting: Nurse Practitioner

## 2023-05-04 NOTE — Telephone Encounter (Signed)
Requesting a refill on generic Vyvanse to [facility] in [place].

## 2023-05-05 ENCOUNTER — Other Ambulatory Visit: Payer: Self-pay | Admitting: Nurse Practitioner

## 2023-05-05 ENCOUNTER — Encounter: Payer: Self-pay | Admitting: Nurse Practitioner

## 2023-05-05 DIAGNOSIS — F900 Attention-deficit hyperactivity disorder, predominantly inattentive type: Secondary | ICD-10-CM

## 2023-05-05 DIAGNOSIS — F419 Anxiety disorder, unspecified: Secondary | ICD-10-CM

## 2023-05-05 MED ORDER — LISDEXAMFETAMINE DIMESYLATE 20 MG PO CAPS: ORAL_CAPSULE | ORAL | 0 refills | Status: DC

## 2023-05-06 ENCOUNTER — Ambulatory Visit: Payer: Medicare HMO | Admitting: Nurse Practitioner

## 2023-05-06 ENCOUNTER — Other Ambulatory Visit: Payer: Self-pay | Admitting: Nurse Practitioner

## 2023-05-06 DIAGNOSIS — F419 Anxiety disorder, unspecified: Secondary | ICD-10-CM

## 2023-05-10 ENCOUNTER — Other Ambulatory Visit: Payer: Self-pay | Admitting: Nurse Practitioner

## 2023-05-10 DIAGNOSIS — F419 Anxiety disorder, unspecified: Secondary | ICD-10-CM

## 2023-05-11 ENCOUNTER — Encounter: Payer: Self-pay | Admitting: Nurse Practitioner

## 2023-06-11 ENCOUNTER — Ambulatory Visit: Payer: Medicare HMO | Admitting: Nurse Practitioner

## 2023-06-29 ENCOUNTER — Ambulatory Visit (INDEPENDENT_AMBULATORY_CARE_PROVIDER_SITE_OTHER): Admitting: Physician Assistant

## 2023-06-29 VITALS — BP 114/76 | HR 87 | Ht 64.0 in | Wt 137.2 lb

## 2023-06-29 DIAGNOSIS — E1165 Type 2 diabetes mellitus with hyperglycemia: Secondary | ICD-10-CM | POA: Diagnosis not present

## 2023-06-29 DIAGNOSIS — E1169 Type 2 diabetes mellitus with other specified complication: Secondary | ICD-10-CM

## 2023-06-29 DIAGNOSIS — I152 Hypertension secondary to endocrine disorders: Secondary | ICD-10-CM

## 2023-06-29 DIAGNOSIS — Z794 Long term (current) use of insulin: Secondary | ICD-10-CM | POA: Diagnosis not present

## 2023-06-29 DIAGNOSIS — E1159 Type 2 diabetes mellitus with other circulatory complications: Secondary | ICD-10-CM

## 2023-06-29 DIAGNOSIS — E785 Hyperlipidemia, unspecified: Secondary | ICD-10-CM

## 2023-06-29 DIAGNOSIS — F988 Other specified behavioral and emotional disorders with onset usually occurring in childhood and adolescence: Secondary | ICD-10-CM

## 2023-06-29 DIAGNOSIS — G4700 Insomnia, unspecified: Secondary | ICD-10-CM | POA: Insufficient documentation

## 2023-06-29 LAB — COMPREHENSIVE METABOLIC PANEL
ALT: 33 U/L (ref 0–35)
AST: 21 U/L (ref 0–37)
Albumin: 4.2 g/dL (ref 3.5–5.2)
Alkaline Phosphatase: 102 U/L (ref 39–117)
BUN: 19 mg/dL (ref 6–23)
CO2: 28 meq/L (ref 19–32)
Calcium: 9.5 mg/dL (ref 8.4–10.5)
Chloride: 102 meq/L (ref 96–112)
Creatinine, Ser: 0.77 mg/dL (ref 0.40–1.20)
GFR: 79.95 mL/min (ref >60.00)
Glucose, Bld: 263 mg/dL — ABNORMAL HIGH (ref 70–99)
Potassium: 3.9 meq/L (ref 3.5–5.1)
Sodium: 137 meq/L (ref 135–145)
Total Bilirubin: 0.7 mg/dL (ref 0.2–1.2)
Total Protein: 6.8 g/dL (ref 6.0–8.3)

## 2023-06-29 LAB — MAGNESIUM: Magnesium: 1.8 mg/dL (ref 1.5–2.5)

## 2023-06-29 LAB — HEMOGLOBIN A1C: Hgb A1c MFr Bld: 8.8 % — ABNORMAL HIGH (ref 4.6–6.5)

## 2023-06-29 MED ORDER — TRAZODONE HCL 50 MG PO TABS: 25.0000 mg | ORAL_TABLET | Freq: Every evening | ORAL | 3 refills | Status: DC | PRN

## 2023-06-29 NOTE — Assessment & Plan Note (Addendum)
 Chronic, well controlled. Manages with losartan 100 mg  Will check cmp F/b 4 mo

## 2023-06-29 NOTE — Assessment & Plan Note (Signed)
 Last A1c 12/24 10.3%. Pt was advised to re-start insulin, she did not do so. Managed on mounjaro 10 mg weekly. Trial and failure of: metformin, farxiga, ozempic, trulicity. Farxiga caused yeast infections. Uacr utd. On statin, arb. LDL at goal Repeat A1c today. Advised we would either titrate up mounjaro, or start jardiance. More inclined to start jardiance if covered given pt's bmi is 23  F/b 4 mo

## 2023-06-29 NOTE — Assessment & Plan Note (Signed)
 Pt was advised to take magnesium 500 mg tid. She has not. Repeat mag level

## 2023-06-29 NOTE — Assessment & Plan Note (Signed)
 Manages with xanax 0.5 mg prn. She does not want to take daily. Failure of unisom.   Rx trazodone 25-50 mg at bedtime to try.

## 2023-06-29 NOTE — Assessment & Plan Note (Signed)
 Manages with vyvanse 20 mg prn.

## 2023-06-29 NOTE — Assessment & Plan Note (Addendum)
 Manages with crestor 20 mg , asa 81 mg daily. LDL at goal < 70 12/24

## 2023-06-29 NOTE — Progress Notes (Signed)
 New patient visit   Patient: Sydney Dickson   DOB: 05/06/1955   68 y.o. Female  MRN: [id number] Visit Date: 06/29/2023  Today's healthcare provider: Alfredia Ferguson, PA-C   Cc. New pt establish care  Subjective    Sydney Dickson is a 69 y.o. female who presents today as a new patient to establish care.    Discussed the use of AI scribe software for clinical note transcription with the patient, who gave verbal consent to proceed.  History of Present Illness   The patient, with a history of diabetes, HTN, HLD, presents with poor control of her diabetes and difficulty sleeping. She reports being off insulin for about four to five months, relying on weekly Mounjaro for her diabetes management. She has tried other diabetes medications in the past, including Farxiga and metformin, but had to discontinue them due to side effects. Her blood sugar levels remain high despite the use of Mounjaro. She does not desire to restart insulin.  In addition to her diabetes, the patient also struggles with sleep. She occasionally uses Xanax to help her sleep, particularly when she is unable to fall asleep by 2 or 3 am or when dealing with stress. However, she expresses a desire not to rely on Xanax for sleep and is open to trying other options.  She currently lives with her husband, who has cancer, and cares for her elderly mother.      Past Medical History:  Diagnosis Date   ADD (attention deficit disorder)    Allergy    Anxiety    Arthritis    Asthma    with severe allergies pt does use albuterol inhaler when needed   Cataract    removed both eyes   Depression    Diabetes mellitus without complication (HCC) 2010   TYPE 2    GERD (gastroesophageal reflux disease)    hx of had Nissen Fundoplication 2001 has not had any issues since   History of hiatal hernia    Hyperlipidemia 2004   Hypertension    IBS (irritable bowel syndrome)    Lynch syndrome    Neuromuscular disorder (HCC)     Fibromyalgia   Tubular adenoma of colon    Past Surgical History:  Procedure Laterality Date   CARPAL TUNNEL RELEASE  1998, 2001   CHOLECYSTECTOMY     colonscopy      COSMETIC SURGERY     rhinoplasty   EYE SURGERY  04/06/2004   lasik   HERNIA REPAIR     NASAL SINUS SURGERY     NISSEN FUNDOPLICATION  04/07/1999   PARTIAL PROCTECTOMY BY TEM N/A 07/19/2014   Procedure: PARTIAL PROCTECTOMY BY TEM;  Surgeon: Karie Soda, MD;  Location: [facility];  Service: General;  Laterality: N/A;   POLYPECTOMY     ROTATOR CUFF REPAIR Right 04/06/2010   TUBAL LIGATION     UPPER GI ENDOSCOPY     Family Status  Relation Name Status   Mother  Alive   Father  Deceased   Sister  Alive   Brother  Alive   PGM  Deceased   PGF  Deceased   Other 2nd cousin Alive   Neg Hx  (Not Specified)  No partnership data on file   Family History  Problem Relation Age of Onset   Cancer Mother        breast   Hyperlipidemia Mother    Hypertension Mother    Diabetes Mother    Breast cancer Mother  Colon polyps Father    Hyperlipidemia Father    Hypertension Father    Colon cancer Father 26   Skin cancer Father    Prostate cancer Father    Breast cancer Sister    Colon polyps Brother    Colon cancer Brother 55       and again at 44   Cancer Paternal Grandmother        cervical and ? uterine   Colon polyps Paternal Grandfather    Colon cancer Paternal Grandfather 23   Colon polyps Other    Colon cancer Other 50   Stomach cancer Neg Hx    Esophageal cancer Neg Hx    Rectal cancer Neg Hx    Social History   Socioeconomic History   Marital status: Married    Spouse name: Not on file   Number of children: Not on file   Years of education: Not on file   Highest education level: 12th grade  Occupational History   Not on file  Tobacco Use   Smoking status: Never   Smokeless tobacco: Never  Vaping Use   Vaping status: Never Used  Substance and Sexual Activity   Alcohol use: No   Drug use: No    Sexual activity: Not on file  Other Topics Concern   Not on file  Social History Narrative   Not on file   Social Drivers of Health   Financial Resource Strain: Low Risk  (06/29/2023)   Overall Financial Resource Strain (CARDIA)    Difficulty of Paying Living Expenses: Not very hard  Food Insecurity: No Food Insecurity (06/29/2023)   Hunger Vital Sign    Worried About Running Out of Food in the Last Year: Never true    Ran Out of Food in the Last Year: Never true  Transportation Needs: No Transportation Needs (06/29/2023)   PRAPARE - Administrator, Civil Service (Medical): No    Lack of Transportation (Non-Medical): No  Physical Activity: Insufficiently Active (06/29/2023)   Exercise Vital Sign    Days of Exercise per Week: 2 days    Minutes of Exercise per Session: 20 min  Stress: Stress Concern Present (06/29/2023)   [employer] of Occupational Health - Occupational Stress Questionnaire    Feeling of Stress : Very much  Social Connections: Moderately Integrated (06/29/2023)   Social Connection and Isolation Panel [NHANES]    Frequency of Communication with Friends and Family: More than three times a week    Frequency of Social Gatherings with Friends and Family: Once a week    Attends Religious Services: More than 4 times per year    Active Member of [employer] or Organizations: No    Attends Engineer, structural: Not on file    Marital Status: Married   Outpatient Medications Prior to Visit  Medication Sig   acetaminophen (TYLENOL) 500 MG tablet Take 1,000 mg by mouth daily as needed for moderate pain or headache.   ALPRAZolam (XANAX) 0.5 MG tablet Take 1/2-1 tab as needed for sleep limit to 5 days a week to prevent addicition   aspirin 81 MG tablet Take 81 mg by mouth daily.   CHOLECALCIFEROL PO Take 5,000 Units by mouth daily.   Cyanocobalamin (B-12 PO) Take by mouth.   escitalopram (LEXAPRO) 20 MG tablet TAKE 1 TABLET BY MOUTH DAILY FOR MOOD    glucose blood (ONETOUCH VERIO) test strip Use as instructedCHECK BLOOD SUGAR 3 TIMES DAILY-DX-E11.65   Insulin Syringe-Needle U-100 (BD  INSULIN SYRINGE U/F) 31G X 5/16" 1 ML MISC USE AS DIRECTED TWICE A DAY (Patient not taking: Reported on 03/08/2023)   Lancets (ONETOUCH ULTRASOFT) lancets Use 3 lancets daily to check blood sugar-DX-E11.65   levocetirizine (XYZAL) 5 MG tablet TAKE 1 TAB BY MOUTH DAILY FOR ALLERGIES   lisdexamfetamine (VYVANSE) 20 MG capsule Take 1 tab daily in the morning as needed, avoid taking daily due to risk for tolerance. 30 tabs should last longer than a month.   losartan (COZAAR) 100 MG tablet Take 1 tablet Daily for BP & Diabetic Kidney Protection   NOVOLIN 70/30 (70-30) 100 UNIT/ML injection INJECT 25 UNITS SUBCUTANEOUSLY IN THE MORNING AND 20 UNITS AT DINNER. (Patient not taking: Reported on 06/29/2023)   rosuvastatin (CRESTOR) 20 MG tablet TAKE 1 TABLET BY MOUTH EVERY DAY FOR CHOLESTEROL   tirzepatide (MOUNJARO) 10 MG/0.5ML Pen INJECT 1 PEN ( 10 MG ) INTO SKIN EVERY 7 DAYS ( DX: E11.29)   [DISCONTINUED] fluticasone (FLONASE) 50 MCG/ACT nasal spray Place 2 sprays into both nostrils daily.   [DISCONTINUED] ipratropium (ATROVENT) 0.06 % nasal spray SPRAY 2 SPRAYS INTO EACH NOSTRIL 3 TIMES A DAY   [DISCONTINUED] Na Sulfate-K Sulfate-Mg Sulf 17.5-3.13-1.6 GM/177ML SOLN See Admin Instructions. PLEASE SEE ATTACHED FOR DETAILED DIRECTIONS   No facility-administered medications prior to visit.   Allergies  Allergen Reactions   Tape Rash   Ace Inhibitors Cough   Metformin And Related     myalgia   Morphine And Codeine Itching   Penicillins     Pt states that in 3rd grade she was given an injection in the buttock and reacted with a large hive in that area Pt can take oral penicillin with no reaction     Wellbutrin [Bupropion]     Felt funny    Immunization History  Administered Date(s) Administered   Influenza Inj Mdck Quad With Preservative 01/22/2017, 12/30/2017,  01/16/2019, 01/08/2020   Influenza Split 03/08/2014, 01/17/2015   Influenza, High Dose Seasonal PF 02/02/2022   Influenza, Seasonal, Injecte, Preservative Fre 01/08/2016   Influenza-Unspecified 03/20/2021   PPD Test 12/28/2013   Pneumococcal Polysaccharide-23 04/22/2015   Td 10/21/2005   Tdap 08/03/2013    Health Maintenance  Topic Date Due   COVID-19 Vaccine (1) Never done   Zoster Vaccines- Shingrix (1 of 2) Never done   Pneumonia Vaccine 23+ Years old (2 of 2 - PCV) 04/21/2016   INFLUENZA VACCINE  11/05/2022   Medicare Annual Wellness (AWV)  05/06/2023   MAMMOGRAM  06/04/2023   OPHTHALMOLOGY EXAM  06/16/2023   Colonoscopy  09/07/2023   DTaP/Tdap/Td (3 - Td or Tdap) 08/04/2023   HEMOGLOBIN A1C  09/06/2023   Diabetic kidney evaluation - Urine ACR  12/01/2023   FOOT EXAM  12/01/2023   Diabetic kidney evaluation - eGFR measurement  03/07/2024   DEXA SCAN  Completed   Hepatitis C Screening  Completed   HPV VACCINES  Aged Out    Patient Care Team: Alfredia Ferguson, PA-C as PCP - General (Physician Assistant) Sharrell Ku, MD as Consulting Physician (Gastroenterology) Nils Flack, MD as Referring Physician (Dermatology) Marisue Brooklyn, DO (Internal Medicine) Valeria Batman, MD (Inactive) as Consulting Physician (Orthopedic Surgery) Jethro Bolus, MD as Consulting Physician (Ophthalmology) Jens Som Madolyn Frieze, MD as Consulting Physician (Cardiology) Karie Soda, MD as Consulting Physician (General Surgery) Pyrtle, Carie Caddy, MD as Consulting Physician (Gastroenterology)  Review of Systems  Constitutional:  Negative for fatigue and fever.  Respiratory:  Negative for cough and shortness of breath.  Cardiovascular:  Negative for chest pain and leg swelling.  Gastrointestinal:  Negative for abdominal pain.  Neurological:  Negative for dizziness and headaches.        Objective    BP 114/76   Pulse 87   Ht 5\' 4"  (1.626 m)   Wt 137 lb 3.2 oz (62.2 kg)   LMP   (LMP Unknown)   BMI 23.55 kg/m     Physical Exam Constitutional:      General: She is awake.     Appearance: She is well-developed.  HENT:     Head: Normocephalic.  Eyes:     Conjunctiva/sclera: Conjunctivae normal.  Cardiovascular:     Rate and Rhythm: Normal rate and regular rhythm.     Heart sounds: Normal heart sounds.  Pulmonary:     Effort: Pulmonary effort is normal.     Breath sounds: Normal breath sounds.  Skin:    General: Skin is warm.  Neurological:     Mental Status: She is alert and oriented to person, place, and time.  Psychiatric:        Attention and Perception: Attention normal.        Mood and Affect: Mood normal.        Speech: Speech normal.        Behavior: Behavior is cooperative.    Depression Screen    06/29/2023   10:43 AM 02/01/2022    9:01 PM 07/31/2021   10:10 AM 01/02/2014    9:52 AM  PHQ 2/9 Scores  PHQ - 2 Score 3 0 6 0  PHQ- 9 Score 11  12    No results found for any visits on 06/29/23.  Assessment & Plan     Type 2 diabetes mellitus with hyperglycemia, with long-term current use of insulin (HCC) Assessment & Plan: Last A1c 12/24 10.3%. Pt was advised to re-start insulin, she did not do so. Managed on mounjaro 10 mg weekly. Trial and failure of: metformin, farxiga, ozempic, trulicity. Farxiga caused yeast infections. Uacr utd. On statin, arb. LDL at goal Repeat A1c today. Advised we would either titrate up mounjaro, or start jardiance. More inclined to start jardiance if covered given pt's bmi is 23  F/b 4 mo  Orders: -     Hemoglobin A1c -     Comprehensive metabolic panel  Hyperlipidemia associated with type 2 diabetes mellitus (HCC) Assessment & Plan: Manages with crestor 20 mg , asa 81 mg daily. LDL at goal < 70 12/24  Orders: -     Comprehensive metabolic panel  Hypertension associated with diabetes (HCC) Assessment & Plan: Chronic, well controlled. Manages with losartan 100 mg  Will check cmp F/b 4  mo   Hypomagnesemia Assessment & Plan: Pt was advised to take magnesium 500 mg tid. She has not. Repeat mag level  Orders: -     Magnesium  Insomnia, unspecified type Assessment & Plan: Manages with xanax 0.5 mg prn. She does not want to take daily. Failure of unisom.   Rx trazodone 25-50 mg at bedtime to try.   Orders: -     traZODone HCl; Take 0.5-1 tablets (25-50 mg total) by mouth at bedtime as needed for sleep.  Dispense: 30 tablet; Refill: 3  Attention deficit disorder, unspecified type Assessment & Plan: Manages with vyvanse 20 mg prn.      Return in about 4 months (around 10/29/2023) for hypertension, DMII.      Alfredia Ferguson, PA-C  [facility]  [place] [phone] (phone) [phone] (fax)  [facility]

## 2023-06-30 ENCOUNTER — Other Ambulatory Visit: Payer: Self-pay | Admitting: Physician Assistant

## 2023-06-30 ENCOUNTER — Encounter: Payer: Self-pay | Admitting: Physician Assistant

## 2023-06-30 ENCOUNTER — Ambulatory Visit (INDEPENDENT_AMBULATORY_CARE_PROVIDER_SITE_OTHER)

## 2023-06-30 DIAGNOSIS — Z23 Encounter for immunization: Secondary | ICD-10-CM

## 2023-06-30 DIAGNOSIS — E1165 Type 2 diabetes mellitus with hyperglycemia: Secondary | ICD-10-CM

## 2023-06-30 MED ORDER — EMPAGLIFLOZIN 10 MG PO TABS: 10.0000 mg | ORAL_TABLET | Freq: Every day | ORAL | 1 refills | Status: DC

## 2023-06-30 NOTE — Progress Notes (Signed)
 Pt here with husband and had new patient appt yesterday and forgot to get vaccine. Verbal given by pcp. Vaccine given in left deltoid. Pt handled well.

## 2023-07-05 ENCOUNTER — Encounter: Payer: Self-pay | Admitting: Internal Medicine

## 2023-07-08 LAB — HM MAMMOGRAPHY

## 2023-07-11 ENCOUNTER — Other Ambulatory Visit: Payer: Self-pay | Admitting: Nurse Practitioner

## 2023-07-11 DIAGNOSIS — F900 Attention-deficit hyperactivity disorder, predominantly inattentive type: Secondary | ICD-10-CM

## 2023-07-14 LAB — HM DIABETES EYE EXAM

## 2023-07-20 ENCOUNTER — Other Ambulatory Visit: Payer: Self-pay | Admitting: *Deleted

## 2023-07-20 ENCOUNTER — Other Ambulatory Visit: Payer: Self-pay | Admitting: Physician Assistant

## 2023-07-20 DIAGNOSIS — F900 Attention-deficit hyperactivity disorder, predominantly inattentive type: Secondary | ICD-10-CM

## 2023-07-20 DIAGNOSIS — E785 Hyperlipidemia, unspecified: Secondary | ICD-10-CM

## 2023-07-20 MED ORDER — LISDEXAMFETAMINE DIMESYLATE 20 MG PO CAPS
ORAL_CAPSULE | ORAL | 0 refills | Status: DC
Start: 2023-07-20 — End: 2023-09-03

## 2023-07-20 MED ORDER — ROSUVASTATIN CALCIUM 20 MG PO TABS: ORAL_TABLET | ORAL | 3 refills | Status: DC

## 2023-07-20 MED ORDER — LEVOCETIRIZINE DIHYDROCHLORIDE 5 MG PO TABS: ORAL_TABLET | ORAL | 3 refills | Status: DC

## 2023-07-20 NOTE — Telephone Encounter (Signed)
 Copied from CRM [id number]. Topic: Clinical - Medication Refill >> Jul 20, 2023  1:04 PM Chuck Crater wrote: Most Recent Primary Care Visit:  Provider: Ysidro Her  Department: [facility]  Visit Type: CLINICAL SUPPORT  Date: 06/30/2023  Medication: levocetirizine (XYZAL) 5 MG tablet osuvastatin (CRESTOR) 20 MG tablet lisdexamfetamine (VYVANSE) 20 MG capsule   Has the patient contacted their pharmacy? No (Agent: If no, request that the patient contact the pharmacy for the refill. If patient does not wish to contact the pharmacy document the reason why and proceed with request.) (Agent: If yes, when and what did the pharmacy advise?) advised to call doctor   Is this the correct pharmacy for this prescription? Yes If no, delete pharmacy and type the correct one.  This is the patient's preferred pharmacy:  [id number] - [place], [place] - [address] [place] [address] Phone: [phone] Fax: [phone]  Has the prescription been filled recently? No  Is the patient out of the medication? No  Has the patient been seen for an appointment in the last year OR does the patient have an upcoming appointment? Yes  Can we respond through MyChart? Yes  Agent: Please be advised that Rx refills may take up to 3 business days. We ask that you follow-up with your pharmacy.

## 2023-07-22 ENCOUNTER — Other Ambulatory Visit: Payer: Self-pay | Admitting: Physician Assistant

## 2023-07-22 DIAGNOSIS — G4700 Insomnia, unspecified: Secondary | ICD-10-CM

## 2023-07-30 ENCOUNTER — Other Ambulatory Visit: Payer: Self-pay | Admitting: Physician Assistant

## 2023-07-30 DIAGNOSIS — E785 Hyperlipidemia, unspecified: Secondary | ICD-10-CM

## 2023-08-03 ENCOUNTER — Ambulatory Visit

## 2023-08-17 ENCOUNTER — Ambulatory Visit (AMBULATORY_SURGERY_CENTER)

## 2023-08-17 VITALS — Ht 64.0 in | Wt 130.0 lb

## 2023-08-17 DIAGNOSIS — Z1509 Genetic susceptibility to other malignant neoplasm: Secondary | ICD-10-CM

## 2023-08-17 DIAGNOSIS — Z860100 Personal history of colon polyps, unspecified: Secondary | ICD-10-CM

## 2023-08-17 DIAGNOSIS — Z800 Family history of malignant neoplasm of digestive organs: Secondary | ICD-10-CM

## 2023-08-17 MED ORDER — NA SULFATE-K SULFATE-MG SULF 17.5-3.13-1.6 GM/177ML PO SOLN: 1.0000 | Freq: Once | ORAL | 0 refills | Status: AC

## 2023-08-17 NOTE — Progress Notes (Signed)
 No egg or soy allergy known to patient  No issues known to pt with past sedation with any surgeries or procedures Patient denies ever being told they had issues or difficulty with intubation  No FH of Malignant Hyperthermia Pt is not on diet pills Pt is not on  home 02  Pt is not on blood thinners  Constipation on occasion  No A fib or A flutter Have any cardiac testing pending-- no  LOA: independent  Prep: suprep   Patient's chart reviewed by Rogena Class CNRA prior to previsit and patient appropriate for the [facility].  Previsit completed and red dot placed by patient's name on their procedure day (on provider's schedule).     PV completed with patient. Prep instructions sent via mychart and home address.

## 2023-08-17 NOTE — Patient Instructions (Signed)
 ONCE A WEEK INJECTIONS Ozempic ,  Mounjaro , Wegovy , Trulicity , Tanzeum, Byetta , Victoza, Bydureon , & SymlinPen  -DO NOT TAKE 7 days prior to the procedure.  Last dose on or before Monday 5/26 failure to hold this medication will result in a cancellation or rescheduling of your procedure

## 2023-08-18 ENCOUNTER — Encounter: Payer: Self-pay | Admitting: Internal Medicine

## 2023-09-03 ENCOUNTER — Telehealth: Payer: Self-pay | Admitting: Physician Assistant

## 2023-09-03 DIAGNOSIS — F900 Attention-deficit hyperactivity disorder, predominantly inattentive type: Secondary | ICD-10-CM

## 2023-09-03 MED ORDER — LISDEXAMFETAMINE DIMESYLATE 20 MG PO CAPS: ORAL_CAPSULE | ORAL | 0 refills | Status: DC

## 2023-09-03 NOTE — Telephone Encounter (Signed)
 Copied from CRM [id number]. Topic: Clinical - Medication Refill >> Sep 03, 2023 12:23 PM Magdalene School wrote: Medication: lisdexamfetamine (VYVANSE ) 20 MG capsule Patient would like 90 day supply instead of 30 day.  Has the patient contacted their pharmacy? Yes (Agent: If no, request that the patient contact the pharmacy for the refill. If patient does not wish to contact the pharmacy document the reason why and proceed with request.) (Agent: If yes, when and what did the pharmacy advise?) no refills available  This is the patient's preferred pharmacy:  [id number] [place], [place] - [address] [place] [address] Phone: [phone] Fax: [phone]   Is this the correct pharmacy for this prescription? Yes If no, delete pharmacy and type the correct one.   Has the prescription been filled recently? No  Is the patient out of the medication? No Has 6 pills left.   Has the patient been seen for an appointment in the last year OR does the patient have an upcoming appointment? Yes  Can we respond through MyChart? No  Agent: Please be advised that Rx refills may take up to 3 business days. We ask that you follow-up with your pharmacy.

## 2023-09-07 ENCOUNTER — Encounter: Payer: Self-pay | Admitting: Internal Medicine

## 2023-09-07 ENCOUNTER — Ambulatory Visit: Admitting: Internal Medicine

## 2023-09-07 VITALS — BP 80/48 | HR 69 | Temp 97.9°F | Resp 14 | Ht 64.0 in

## 2023-09-07 DIAGNOSIS — D124 Benign neoplasm of descending colon: Secondary | ICD-10-CM

## 2023-09-07 DIAGNOSIS — Z800 Family history of malignant neoplasm of digestive organs: Secondary | ICD-10-CM

## 2023-09-07 DIAGNOSIS — Z860100 Personal history of colon polyps, unspecified: Secondary | ICD-10-CM

## 2023-09-07 DIAGNOSIS — Z1509 Genetic susceptibility to other malignant neoplasm: Secondary | ICD-10-CM

## 2023-09-07 MED ORDER — SODIUM CHLORIDE 0.9 % IV SOLN
500.0000 mL | Freq: Once | INTRAVENOUS | Status: DC
Start: 2023-09-07 — End: 2023-09-07

## 2023-09-07 NOTE — Progress Notes (Unsigned)
 Vitals-Autumn  Pt's states no medical or surgical changes since previsit or office visit.

## 2023-09-07 NOTE — Progress Notes (Unsigned)
 Sedate, gd SR, tolerated procedure well, VSS, report to RN

## 2023-09-07 NOTE — Patient Instructions (Signed)
 Resume previous diet Continue present medications Await pathology results; polyps removed   YOU HAD AN ENDOSCOPIC PROCEDURE TODAY AT THE [facility]:   Refer to the procedure report that was given to you for any specific questions about what was found during the examination.  If the procedure report does not answer your questions, please call your gastroenterologist to clarify.  If you requested that your care partner not be given the details of your procedure findings, then the procedure report has been included in a sealed envelope for you to review at your convenience later.  YOU SHOULD EXPECT: Some feelings of bloating in the abdomen. Passage of more gas than usual.  Walking can help get rid of the air that was put into your GI tract during the procedure and reduce the bloating. If you had a lower endoscopy (such as a colonoscopy or flexible sigmoidoscopy) you may notice spotting of blood in your stool or on the toilet paper. If you underwent a bowel prep for your procedure, you may not have a normal bowel movement for a few days.  Please Note:  You might notice some irritation and congestion in your nose or some drainage.  This is from the oxygen used during your procedure.  There is no need for concern and it should clear up in a day or so.  SYMPTOMS TO REPORT IMMEDIATELY:  Following lower endoscopy (colonoscopy or flexible sigmoidoscopy):  Excessive amounts of blood in the stool  Significant tenderness or worsening of abdominal pains  Swelling of the abdomen that is new, acute  Fever of 100F or higher   For urgent or emergent issues, a gastroenterologist can be reached at any hour by calling [phone]. Do not use MyChart messaging for urgent concerns.    DIET:  We do recommend a small meal at first, but then you may proceed to your regular diet.  Drink plenty of fluids but you should avoid alcoholic beverages for 24 hours.  ACTIVITY:  You should plan to take it  easy for the rest of today and you should NOT DRIVE or use heavy machinery until tomorrow (because of the sedation medicines used during the test).    FOLLOW UP: Our staff will call the number listed on your records the next business day following your procedure.  We will call around 7:15- 8:00 am to check on you and address any questions or concerns that you may have regarding the information given to you following your procedure. If we do not reach you, we will leave a message.     If any biopsies were taken you will be contacted by phone or by letter within the next 1-3 weeks.  Please call us  at [phone] if you have not heard about the biopsies in 3 weeks.    SIGNATURES/CONFIDENTIALITY: You and/or your care partner have signed paperwork which will be entered into your electronic medical record.  These signatures attest to the fact that that the information above on your After Visit Summary has been reviewed and is understood.  Full responsibility of the confidentiality of this discharge information lies with you and/or your care-partner.

## 2023-09-07 NOTE — Progress Notes (Unsigned)
 GASTROENTEROLOGY PROCEDURE H&P NOTE   Primary Care Physician: Trenton Frock, PA-C    Reason for Procedure:   Lynch syndrome  Plan:    colonoscopy  Patient is appropriate for endoscopic procedure(s) in the ambulatory ([facility]) setting.  The nature of the procedure, as well as the risks, benefits, and alternatives were carefully and thoroughly reviewed with the patient. Ample time for discussion and questions allowed. The patient understood, was satisfied, and agreed to proceed.     HPI: Sydney Dickson is a 68 y.o. female who presents for colonoscopy.  Medical history as below.  Tolerated the prep.  No recent chest pain or shortness of breath.  No abdominal pain today.  Past Medical History:  Diagnosis Date   ADD (attention deficit disorder)    Allergy    Anxiety    Arthritis    Asthma    with severe allergies pt does use albuterol  inhaler when needed   Cataract    removed both eyes   Depression    Diabetes mellitus without complication (HCC) 2010   TYPE 2    GERD (gastroesophageal reflux disease)    hx of had Nissen Fundoplication 2001 has not had any issues since   History of hiatal hernia    Hyperlipidemia 2004   Hypertension    IBS (irritable bowel syndrome)    Lynch syndrome    Neuromuscular disorder (HCC)    Fibromyalgia   Tubular adenoma of colon     Past Surgical History:  Procedure Laterality Date   CARPAL TUNNEL RELEASE  1998, 2001   CHOLECYSTECTOMY     colonscopy      COSMETIC SURGERY     rhinoplasty   EYE SURGERY  04/06/2004   lasik   HERNIA REPAIR     NASAL SINUS SURGERY     NISSEN FUNDOPLICATION  04/07/1999   PARTIAL PROCTECTOMY BY TEM N/A 07/19/2014   Procedure: PARTIAL PROCTECTOMY BY TEM;  Surgeon: Candyce Champagne, MD;  Location: [facility];  Service: General;  Laterality: N/A;   POLYPECTOMY     ROTATOR CUFF REPAIR Right 04/06/2010   TUBAL LIGATION     UPPER GI ENDOSCOPY      Prior to Admission medications   Medication Sig Start Date End  Date Taking? Authorizing Provider  aspirin 81 MG tablet Take 81 mg by mouth daily.   Yes [provider]  CHOLECALCIFEROL PO Take 5,000 Units by mouth daily.   Yes [provider]  Cyanocobalamin (B-12 PO) Take by mouth.   Yes [provider]  escitalopram  (LEXAPRO ) 20 MG tablet TAKE 1 TABLET BY MOUTH DAILY FOR MOOD 04/11/23  Yes Wilkinson, Dana E, FNP  levocetirizine (XYZAL ) 5 MG tablet TAKE 1 TAB BY MOUTH DAILY FOR ALLERGIES 07/20/23  Yes Trenton Frock, PA-C  lisdexamfetamine (VYVANSE ) 20 MG capsule Take 1 tab daily in the morning as needed 09/03/23  Yes Drubel, Heidi Llamas, PA-C  losartan  (COZAAR ) 100 MG tablet Take 1 tablet Daily for BP & Diabetic Kidney Protection 10/27/22  Yes Cranford, Tonya, NP  rosuvastatin  (CRESTOR ) 20 MG tablet TAKE 1 TABLET BY MOUTH EVERY DAY FOR CHOLESTEROL 07/20/23  Yes Drubel, Heidi Llamas, PA-C  tirzepatide  (MOUNJARO ) 10 MG/0.5ML Pen INJECT 1 PEN ( 10 MG ) INTO SKIN EVERY 7 DAYS ( DX: E11.29) 05/03/23  Yes Cranford, Tonya, NP  traZODone  (DESYREL ) 50 MG tablet Take 0.5-1 tablets (25-50 mg total) by mouth at bedtime as needed for sleep. 06/29/23  Yes Trenton Frock, PA-C  acetaminophen  (TYLENOL ) 500 MG tablet  Take 1,000 mg by mouth daily as needed for moderate pain or headache.    [provider]  ALPRAZolam  (XANAX ) 0.5 MG tablet Take 1/2-1 tab as needed for sleep limit to 5 days a week to prevent addicition 05/03/23   Langley Pippin, NP  glucose blood (ONETOUCH VERIO) test strip Use as instructedCHECK BLOOD SUGAR 3 TIMES DAILY-DX-E11.65 06/17/22   Cranford, Tonya, NP  Lancets (ONETOUCH ULTRASOFT) lancets Use 3 lancets daily to check blood sugar-DX-E11.65 04/29/20   Vangie Genet, MD    Current Outpatient Medications  Medication Sig Dispense Refill   aspirin 81 MG tablet Take 81 mg by mouth daily.     CHOLECALCIFEROL PO Take 5,000 Units by mouth daily.     Cyanocobalamin (B-12 PO) Take by mouth.     escitalopram  (LEXAPRO ) 20 MG tablet TAKE 1  TABLET BY MOUTH DAILY FOR MOOD 90 tablet 1   levocetirizine (XYZAL ) 5 MG tablet TAKE 1 TAB BY MOUTH DAILY FOR ALLERGIES 90 tablet 3   lisdexamfetamine (VYVANSE ) 20 MG capsule Take 1 tab daily in the morning as needed 30 capsule 0   losartan  (COZAAR ) 100 MG tablet Take 1 tablet Daily for BP & Diabetic Kidney Protection 90 tablet 3   rosuvastatin  (CRESTOR ) 20 MG tablet TAKE 1 TABLET BY MOUTH EVERY DAY FOR CHOLESTEROL 90 tablet 3   tirzepatide  (MOUNJARO ) 10 MG/0.5ML Pen INJECT 1 PEN ( 10 MG ) INTO SKIN EVERY 7 DAYS ( DX: E11.29) 2 mL 3   traZODone  (DESYREL ) 50 MG tablet Take 0.5-1 tablets (25-50 mg total) by mouth at bedtime as needed for sleep. 30 tablet 3   acetaminophen  (TYLENOL ) 500 MG tablet Take 1,000 mg by mouth daily as needed for moderate pain or headache.     ALPRAZolam  (XANAX ) 0.5 MG tablet Take 1/2-1 tab as needed for sleep limit to 5 days a week to prevent addicition 30 tablet 0   glucose blood (ONETOUCH VERIO) test strip Use as instructedCHECK BLOOD SUGAR 3 TIMES DAILY-DX-E11.65 300 strip 3   Lancets (ONETOUCH ULTRASOFT) lancets Use 3 lancets daily to check blood sugar-DX-E11.65 300 each 1   Current Facility-Administered Medications  Medication Dose Route Frequency Provider Last Rate Last Admin   0.9 %  sodium chloride  infusion  500 mL Intravenous Once Jusiah Aguayo, Amber Bail, MD        Allergies as of 09/07/2023 - Review Complete 09/07/2023  Allergen Reaction Noted   Morphine and codeine  Itching 04/27/2013   Tape Rash 07/12/2014   Wellbutrin [bupropion] Other (See Comments) 04/27/2013   Ace inhibitors Cough 02/18/2015   Metformin  and related Other (See Comments) 08/03/2013   Penicillins Hives 01/24/2013    Family History  Problem Relation Age of Onset   Cancer Mother        breast   Hyperlipidemia Mother    Hypertension Mother    Diabetes Mother    Breast cancer Mother    Colon polyps Father    Hyperlipidemia Father    Hypertension Father    Colon cancer Father 49   Skin  cancer Father    Prostate cancer Father    Breast cancer Sister    Colon polyps Brother    Colon cancer Brother 24       and again at 54   Cancer Paternal Grandmother        cervical and ? uterine   Colon polyps Paternal Grandfather    Colon cancer Paternal Grandfather 105   Colon polyps Other    Colon cancer  Other 50   Stomach cancer Neg Hx    Esophageal cancer Neg Hx    Rectal cancer Neg Hx     Social History   Socioeconomic History   Marital status: Married    Spouse name: Not on file   Number of children: Not on file   Years of education: Not on file   Highest education level: 12th grade  Occupational History   Not on file  Tobacco Use   Smoking status: Never   Smokeless tobacco: Never  Vaping Use   Vaping status: Never Used  Substance and Sexual Activity   Alcohol use: No   Drug use: No   Sexual activity: Not on file  Other Topics Concern   Not on file  Social History Narrative   Not on file   Social Drivers of Health   Financial Resource Strain: Low Risk  (06/29/2023)   Overall Financial Resource Strain (CARDIA)    Difficulty of Paying Living Expenses: Not very hard  Food Insecurity: No Food Insecurity (06/29/2023)   Hunger Vital Sign    Worried About Running Out of Food in the Last Year: Never true    Ran Out of Food in the Last Year: Never true  Transportation Needs: No Transportation Needs (06/29/2023)   PRAPARE - Administrator, Civil Service (Medical): No    Lack of Transportation (Non-Medical): No  Physical Activity: Insufficiently Active (06/29/2023)   Exercise Vital Sign    Days of Exercise per Week: 2 days    Minutes of Exercise per Session: 20 min  Stress: Stress Concern Present (06/29/2023)   [employer] of Occupational Health - Occupational Stress Questionnaire    Feeling of Stress : Very much  Social Connections: Moderately Integrated (06/29/2023)   Social Connection and Isolation Panel [NHANES]    Frequency of Communication  with Friends and Family: More than three times a week    Frequency of Social Gatherings with Friends and Family: Once a week    Attends Religious Services: More than 4 times per year    Active Member of [employer] or Organizations: No    Attends Engineer, structural: Not on file    Marital Status: Married  Catering manager Violence: Not on file    Physical Exam: Vital signs in last 24 hours: @BP  117/76 (BP Location: Right Arm, Patient Position: Sitting, Cuff Size: Normal)   Pulse 79   Temp 97.9 F (36.6 C) (Temporal)   Resp (!) 23   Ht 5\' 4"  (1.626 m)   LMP  (LMP Unknown)   SpO2 100%   BMI 22.31 kg/m  GEN: NAD EYE: Sclerae anicteric ENT: MMM CV: Non-tachycardic Pulm: CTA b/l GI: Soft, NT/ND NEURO:  Alert & Oriented x 3   Laurell Pond, MD [facility]  09/07/2023 10:07 AM

## 2023-09-07 NOTE — Progress Notes (Signed)
 Called to room to assist during endoscopic procedure.  Patient ID and intended procedure confirmed with present staff. Received instructions for my participation in the procedure from the performing physician.

## 2023-09-07 NOTE — Op Note (Signed)
 [facility] Patient Name: Sydney Dickson Procedure Date: 09/07/2023 9:50 AM MRN: [id number] Endoscopist: Nannette Babe , MD, [id number] Age: 68 Referring MD:  Date of Birth: [birth year] Gender: Female Account #: [id number] Procedure:                Colonoscopy Indications:              Last colonoscopy: June 2024, Lynch Syndrome Medicines:                Monitored Anesthesia Care Procedure:                Pre-Anesthesia Assessment:                           - Prior to the procedure, a History and Physical                            was performed, and patient medications and                            allergies were reviewed. The patient's tolerance of                            previous anesthesia was also reviewed. The risks                            and benefits of the procedure and the sedation                            options and risks were discussed with the patient.                            All questions were answered, and informed consent                            was obtained. Prior Anticoagulants: The patient has                            taken no anticoagulant or antiplatelet agents. ASA                            Grade Assessment: II - A patient with mild systemic                            disease. After reviewing the risks and benefits,                            the patient was deemed in satisfactory condition to                            undergo the procedure.                           After obtaining informed consent, the colonoscope  was passed under direct vision. Throughout the                            procedure, the patient's blood pressure, pulse, and                            oxygen saturations were monitored continuously. The                            [id number]-HQ190L Colonoscope [id number] was introduced                            through the anus and advanced to the terminal                            ileum. The colonoscopy  was performed without                            difficulty. The patient tolerated the procedure                            well. The quality of the bowel preparation was                            good. The terminal ileum, ileocecal valve,                            appendiceal orifice, and rectum were photographed. Scope In: 10:14:37 AM Scope Out: 10:39:32 AM Scope Withdrawal Time: 0 hours 15 minutes 45 seconds  Total Procedure Duration: 0 hours 24 minutes 55 seconds  Findings:                 The digital rectal exam was normal.                           The terminal ileum appeared normal.                           Two sessile polyps were found in the descending                            colon. The polyps were 3 to 5 mm in size. These                            polyps were removed with a cold snare. Resection                            and retrieval were complete.                           The exam was otherwise without abnormality on                            direct and retroflexion views. Complications:  No immediate complications. Estimated Blood Loss:     Estimated blood loss was minimal. Impression:               - The examined portion of the ileum was normal.                           - No specimens collected. Recommendation:           - Patient has a contact number available for                            emergencies. The signs and symptoms of potential                            delayed complications were discussed with the                            patient. Return to normal activities tomorrow.                            Written discharge instructions were provided to the                            patient.                           - Resume previous diet.                           - Continue present medications.                           - Await pathology results.                           - Repeat colonoscopy in 1 year for surveillance. Nannette Babe,  MD 09/07/2023 10:55:02 AM This report has been signed electronically.

## 2023-09-08 ENCOUNTER — Ambulatory Visit (INDEPENDENT_AMBULATORY_CARE_PROVIDER_SITE_OTHER): Admitting: *Deleted

## 2023-09-08 ENCOUNTER — Telehealth: Payer: Self-pay

## 2023-09-08 VITALS — Ht 64.0 in | Wt 127.0 lb

## 2023-09-08 DIAGNOSIS — Z0000 Encounter for general adult medical examination without abnormal findings: Secondary | ICD-10-CM | POA: Diagnosis not present

## 2023-09-08 NOTE — Patient Instructions (Addendum)
 [name] , Thank you for taking time out of your busy schedule to complete your Annual Wellness Visit with me. I enjoyed our conversation and look forward to speaking with you again next year. I, as well as your care team,  appreciate your ongoing commitment to your health goals. Please review the following plan we discussed and let me know if I can assist you in the future. Your Game plan/ To Do List    Follow up Visits: Next Medicare AWV with our clinical staff: 09/08/24 9am   Next Office Visit with your provider: 10/29/23 10:20am  Please get your tetanus booster at your local pharmacy as you are due now.   Clinician Recommendations:  Aim for 30 minutes of exercise or brisk walking, 6-8 glasses of water, and 5 servings of fruits and vegetables each day.   I have attached information about exercise recommendations and hope this will help you start a regular routine.      This is a list of the screening recommended for you and due dates:  Health Maintenance  Topic Date Due   COVID-19 Vaccine (1) Never done   Zoster (Shingles) Vaccine (1 of 2) Never done   Medicare Annual Wellness Visit  05/06/2023   Mammogram  06/04/2023   Eye exam for diabetics  06/16/2023   DTaP/Tdap/Td vaccine (3 - Td or Tdap) 08/04/2023   Flu Shot  11/05/2023   Yearly kidney health urinalysis for diabetes  12/01/2023   Complete foot exam   12/01/2023   Hemoglobin A1C  12/30/2023   Yearly kidney function blood test for diabetes  06/28/2024   Colon Cancer Screening  09/06/2024   Pneumonia Vaccine  Completed   DEXA scan (bone density measurement)  Completed   Hepatitis C Screening  Completed   HPV Vaccine  Aged Out   Meningitis B Vaccine  Aged Out    Please return copies of your Advanced Directives following either of the routes below:  Advanced directives: (Copy Requested) Please bring a copy of your health care power of attorney and living will to the office to be added to your chart at your convenience. You  can mail to [facility] [address] [place], [place] [address] or email to [email or web address] Advance Care Planning is important because it:  [x]  Makes sure you receive the medical care that is consistent with your values, goals, and preferences  [x]  It provides guidance to your family and loved ones and reduces their decisional burden about whether or not they are making the right decisions based on your wishes.  Follow the link provided in your after visit summary or read over the paperwork we have mailed to you to help you started getting your Advance Directives in place. If you need assistance in completing these, please reach out to us  so that we can help you!  See attachments for Preventive Care and Fall Prevention Tips.

## 2023-09-08 NOTE — Progress Notes (Addendum)
 Subjective:   Sydney Dickson is a 68 y.o. who presents for a Medicare Wellness preventive visit.  As a reminder, Annual Wellness Visits don't include a physical exam, and some assessments may be limited, especially if this visit is performed virtually. We may recommend an in-person follow-up visit with your provider if needed.  Visit Complete: Virtual I connected with  [name] on 09/08/23 by a audio enabled telemedicine application and verified that I am speaking with the correct person using two identifiers.  Patient Location: Home  Provider Location: Office/Clinic  I discussed the limitations of evaluation and management by telemedicine. The patient expressed understanding and agreed to proceed.  Vital Signs: Because this visit was a virtual/telehealth visit, some criteria may be missing or patient reported. Any vitals not documented were not able to be obtained and vitals that have been documented are patient reported.  VideoDeclined- This patient declined Librarian, academic. Therefore the visit was completed with audio only.  Persons Participating in Visit: Patient.  AWV Questionnaire: No: Patient Medicare AWV questionnaire was not completed prior to this visit.  Cardiac Risk Factors include: advanced age (>50men, >58 women);diabetes mellitus;dyslipidemia;hypertension     Objective:     Today's Vitals   09/08/23 0900 09/08/23 0905  Weight: 127 lb (57.6 kg)   Height: 5\' 4"  (1.626 m)   PainSc:  8    Body mass index is 21.8 kg/m.     09/08/2023    9:24 AM 07/31/2021    9:48 AM 09/06/2017    2:03 PM 07/16/2017   11:56 AM 05/20/2017    2:26 PM 04/21/2017   12:32 PM 07/19/2014   11:34 PM  Advanced Directives  Does Patient Have a Medical Advance Directive? Yes Yes Yes Yes Yes Yes Yes  Type of Advance Directive Living will Healthcare Power of [place];Living will Healthcare Power of [employer] of [place];Living will Living will  Healthcare Power of [place];Living will Living will;Healthcare Power of Attorney  Does patient want to make changes to medical advance directive? No - Patient declined    No - Patient declined  No - Patient declined  Copy of Healthcare Power of Attorney in Chart?   No - copy requested No - copy requested  No - copy requested No - copy requested    Current Medications (verified) Outpatient Encounter Medications as of 09/08/2023  Medication Sig   acetaminophen  (TYLENOL ) 500 MG tablet Take 1,000 mg by mouth daily as needed for moderate pain or headache.   ALPRAZolam  (XANAX ) 0.5 MG tablet Take 1/2-1 tab as needed for sleep limit to 5 days a week to prevent addicition   aspirin 81 MG tablet Take 81 mg by mouth daily.   CHOLECALCIFEROL PO Take 5,000 Units by mouth daily.   Cyanocobalamin (B-12 PO) Take by mouth.   escitalopram  (LEXAPRO ) 20 MG tablet TAKE 1 TABLET BY MOUTH DAILY FOR MOOD   glucose blood (ONETOUCH VERIO) test strip Use as instructedCHECK BLOOD SUGAR 3 TIMES DAILY-DX-E11.65   Lancets (ONETOUCH ULTRASOFT) lancets Use 3 lancets daily to check blood sugar-DX-E11.65   levocetirizine (XYZAL ) 5 MG tablet TAKE 1 TAB BY MOUTH DAILY FOR ALLERGIES   lisdexamfetamine (VYVANSE ) 20 MG capsule Take 1 tab daily in the morning as needed   losartan  (COZAAR ) 100 MG tablet Take 1 tablet Daily for BP & Diabetic Kidney Protection   rosuvastatin  (CRESTOR ) 20 MG tablet TAKE 1 TABLET BY MOUTH EVERY DAY FOR CHOLESTEROL   tirzepatide  (MOUNJARO ) 10 MG/0.5ML Pen  INJECT 1 PEN ( 10 MG ) INTO SKIN EVERY 7 DAYS ( DX: E11.29)   traZODone  (DESYREL ) 50 MG tablet Take 0.5-1 tablets (25-50 mg total) by mouth at bedtime as needed for sleep.   No facility-administered encounter medications on file as of 09/08/2023.    Allergies (verified) Morphine and codeine , Tape, Wellbutrin [bupropion], Ace inhibitors, Metformin  and related, Penicillins, and Jardiance  [empagliflozin ]   History: Past Medical History:  Diagnosis Date    ADD (attention deficit disorder)    Allergy    Anxiety    Arthritis    Asthma    with severe allergies pt does use albuterol  inhaler when needed   Cataract    removed both eyes   Depression    Diabetes mellitus without complication (HCC) 2010   TYPE 2    GERD (gastroesophageal reflux disease)    hx of had Nissen Fundoplication 2001 has not had any issues since   History of hiatal hernia    Hyperlipidemia 2004   Hypertension    IBS (irritable bowel syndrome)    Lynch syndrome    Neuromuscular disorder (HCC)    Fibromyalgia   Tubular adenoma of colon    Past Surgical History:  Procedure Laterality Date   CARPAL TUNNEL RELEASE  1998, 2001   CHOLECYSTECTOMY     colonscopy      COSMETIC SURGERY     rhinoplasty   EYE SURGERY  04/06/2004   lasik   HERNIA REPAIR     NASAL SINUS SURGERY     NISSEN FUNDOPLICATION  04/07/1999   PARTIAL PROCTECTOMY BY TEM N/A 07/19/2014   Procedure: PARTIAL PROCTECTOMY BY TEM;  Surgeon: Candyce Champagne, MD;  Location: [facility];  Service: General;  Laterality: N/A;   POLYPECTOMY     ROTATOR CUFF REPAIR Right 04/06/2010   TUBAL LIGATION     UPPER GI ENDOSCOPY     Family History  Problem Relation Age of Onset   Cancer Mother        breast   Hyperlipidemia Mother    Hypertension Mother    Diabetes Mother    Breast cancer Mother    Colon polyps Father    Hyperlipidemia Father    Hypertension Father    Colon cancer Father 45   Skin cancer Father    Prostate cancer Father    Breast cancer Sister    Colon polyps Brother    Colon cancer Brother 69       and again at 23   Cancer Paternal Grandmother        cervical and ? uterine   Colon polyps Paternal Grandfather    Colon cancer Paternal Grandfather 30   Colon polyps Other    Colon cancer Other 50   Stomach cancer Neg Hx    Esophageal cancer Neg Hx    Rectal cancer Neg Hx    Social History   Socioeconomic History   Marital status: Married    Spouse name: Not on file   Number of  children: Not on file   Years of education: Not on file   Highest education level: 12th grade  Occupational History   Not on file  Tobacco Use   Smoking status: Never   Smokeless tobacco: Never  Vaping Use   Vaping status: Never Used  Substance and Sexual Activity   Alcohol use: No   Drug use: No   Sexual activity: Not on file  Other Topics Concern   Not on file  Social History Narrative  Not on file   Social Drivers of Health   Financial Resource Strain: Low Risk  (09/08/2023)   Overall Financial Resource Strain (CARDIA)    Difficulty of Paying Living Expenses: Not very hard  Food Insecurity: No Food Insecurity (09/08/2023)   Hunger Vital Sign    Worried About Running Out of Food in the Last Year: Never true    Ran Out of Food in the Last Year: Never true  Transportation Needs: No Transportation Needs (09/08/2023)   PRAPARE - Administrator, Civil Service (Medical): No    Lack of Transportation (Non-Medical): No  Physical Activity: Inactive (09/08/2023)   Exercise Vital Sign    Days of Exercise per Week: 0 days    Minutes of Exercise per Session: 0 min  Stress: Stress Concern Present (09/08/2023)   [employer] of Occupational Health - Occupational Stress Questionnaire    Feeling of Stress : Very much  Social Connections: Moderately Integrated (09/08/2023)   Social Connection and Isolation Panel [NHANES]    Frequency of Communication with Friends and Family: More than three times a week    Frequency of Social Gatherings with Friends and Family: Once a week    Attends Religious Services: 1 to 4 times per year    Active Member of [employer] or Organizations: No    Attends Engineer, structural: Never    Marital Status: Married    Tobacco Counseling Counseling given: Not Answered    Clinical Intake:  Pre-visit preparation completed: Yes  Pain : 0-10 Pain Score: 8  Pain Type: Chronic pain (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 sees dr) Pain Location: Shoulder Pain Orientation: Right Pain Descriptors / Indicators: Burning, Constant Pain Onset: More than a month ago Pain Frequency: Constant Pain Relieving Factors: takes tylenol . has had hip injections  Pain Relieving Factors: takes tylenol . has had hip injections  BMI - recorded: 21.8 Nutritional Status: BMI of 19-24  Normal Nutritional Risks: None Diabetes: Yes CBG done?: No (only when having symptoms) Did pt. bring in CBG monitor from home?: No  Lab Results  Component Value Date   HGBA1C 8.8 (H) 06/29/2023   HGBA1C 10.3 (H) 03/08/2023   HGBA1C 9.9 (H) 12/01/2022     How often do you need to have someone help you when you read instructions, pamphlets, or other written materials from your doctor or pharmacy?: 1 - Never What is the last grade level you completed in school?: 12th, some college classes  Interpreter Needed?: No  Information entered by :: Susa Engman, CMA   Activities of Daily Living     09/08/2023    9:11 AM  In your present state of health, do you have any difficulty performing the following activities:  Hearing? 0  Comment hearing aids  Vision? 0  Comment Wears glasses, last eye exam 07/2023 with  Difficulty concentrating or making decisions? 0  Walking or climbing stairs? 0  Dressing or bathing? 0  Comment only due to current frozen shoulder  Doing errands, shopping? 0  Preparing Food and eating ? N  Using the Toilet? N  In the past six months, have you accidently leaked urine? Y  Comment couple times a week. Does kegel exercises. Doesn't want to discuss with PCP yet  Do you have problems with loss of bowel control? N  Managing your Medications? N  Managing your Finances? N  Housekeeping or managing your Housekeeping? N    Patient Care Team: Donnia Galas,  Victorino Grates as PCP - General (Physician Assistant) Serafin Dames, MD as Consulting Physician (Gastroenterology) Atlas Blank, MD as Referring Physician  (Dermatology) Ebony Goldstein, DO (Internal Medicine) Shirlee Dotter, MD (Inactive) as Consulting Physician (Orthopedic Surgery) Lenise Quince, MD as Consulting Physician (Cardiology) Candyce Champagne, MD as Consulting Physician (General Surgery) Pyrtle, Amber Bail, MD as Consulting Physician (Gastroenterology) Levander Re, MD as Referring Physician (Ophthalmology)  I have updated your Care Teams any recent Medical Services you may have received from other providers in the past year.     Assessment:    This is a routine wellness examination for Saylee.  Hearing/Vision screen Hearing Screening - Comments:: Has bilateral hearing aids Vision Screening - Comments:: Last eye exam 07/2023 with jennifer Barger, [place]   Goals Addressed             This Visit's Progress    Patient Stated       She wants to begin exercise routine.       Depression Screen     09/08/2023    9:17 AM 06/29/2023   10:43 AM 02/01/2022    9:01 PM 07/31/2021   10:10 AM 01/02/2014    9:52 AM  PHQ 2/9 Scores  PHQ - 2 Score 0 3 0 6 0  PHQ- 9 Score  11  12     Fall Risk     09/08/2023    9:15 AM 06/29/2023   10:27 AM 02/01/2022    9:01 PM 07/31/2021   10:08 AM 01/02/2014    9:52 AM  Fall Risk   Falls in the past year? 1 1 0 1 No  Comment    Tripped over the dog   Number falls in past yr: 1 0  0   Comment Has 4 dogs and gets tripped over them      Injury with Fall? 0 0  0   Risk for fall due to : History of fall(s) History of fall(s) No Fall Risks    Follow up Education provided Falls evaluation completed Falls prevention discussed;Falls evaluation completed;Education provided      MEDICARE RISK AT HOME:  Medicare Risk at Home Any stairs in or around the home?: Yes If so, are there any without handrails?: No Home free of loose throw rugs in walkways, pet beds, electrical cords, etc?: No Adequate lighting in your home to reduce risk of falls?: Yes Life alert?: No Use of a cane, walker  or w/c?: No Grab bars in the bathroom?: No Shower chair or bench in shower?: No Elevated toilet seat or a handicapped toilet?: No  TIMED UP AND GO:  Was the test performed?  No, audio  Cognitive Function: 6CIT completed        09/08/2023    9:32 AM  6CIT Screen  What Year? 0 points  What month? 0 points  What time? 0 points  Count back from 20 0 points  Months in reverse 0 points  Repeat phrase 0 points  Total Score 0 points    Immunizations Immunization History  Administered Date(s) Administered   Influenza Inj Mdck Quad With Preservative 01/22/2017, 12/30/2017, 01/16/2019, 01/08/2020   Influenza Split 03/08/2014, 01/17/2015   Influenza, High Dose Seasonal PF 02/02/2022   Influenza, Seasonal, Injecte, Preservative Fre 01/08/2016   Influenza-Unspecified 03/20/2021   PNEUMOCOCCAL CONJUGATE-20 06/30/2023   PPD Test 12/28/2013   Pneumococcal Polysaccharide-23 04/22/2015   Td 10/21/2005   Tdap 08/03/2013    Screening Tests Health Maintenance  Topic  Date Due   COVID-19 Vaccine (1) Never done   Zoster Vaccines- Shingrix (1 of 2) Never done   Medicare Annual Wellness (AWV)  05/06/2023   MAMMOGRAM  06/04/2023   OPHTHALMOLOGY EXAM  06/16/2023   DTaP/Tdap/Td (3 - Td or Tdap) 08/04/2023   INFLUENZA VACCINE  11/05/2023   Diabetic kidney evaluation - Urine ACR  12/01/2023   FOOT EXAM  12/01/2023   HEMOGLOBIN A1C  12/30/2023   Diabetic kidney evaluation - eGFR measurement  06/28/2024   Colonoscopy  09/06/2024   Pneumonia Vaccine 26+ Years old  Completed   DEXA SCAN  Completed   Hepatitis C Screening  Completed   HPV VACCINES  Aged Out   Meningococcal B Vaccine  Aged Out    Health Maintenance  Health Maintenance Due  Topic Date Due   COVID-19 Vaccine (1) Never done   Zoster Vaccines- Shingrix (1 of 2) Never done   Medicare Annual Wellness (AWV)  05/06/2023   MAMMOGRAM  06/04/2023   OPHTHALMOLOGY EXAM  06/16/2023   DTaP/Tdap/Td (3 - Td or Tdap) 08/04/2023    Health Maintenance Items Addressed: Mammogram completed 07/08/23. Copy requested from [place]. Eye exam completed 07/2023, copy requested from [facility]. Will get tetanus at local pharmacy.  Additional Screening:  Vision Screening: Recommended annual ophthalmology exams for early detection of glaucoma and other disorders of the eye. Would you like a referral to an eye doctor? No    Dental Screening: Recommended annual dental exams for proper oral hygiene  Community Resource Referral / Chronic Care Management: CRR required this visit?  No   CCM required this visit?  No   Plan:    I have personally reviewed and noted the following in the patient's chart:   Medical and social history Use of alcohol, tobacco or illicit drugs  Current medications and supplements including opioid prescriptions. Patient is not currently taking opioid prescriptions. Functional ability and status Nutritional status Physical activity Advanced directives List of other physicians Hospitalizations, surgeries, and ER visits in previous 12 months Vitals Screenings to include cognitive, depression, and falls Referrals and appointments  In addition, I have reviewed and discussed with patient certain preventive protocols, quality metrics, and best practice recommendations. A written personalized care plan for preventive services as well as general preventive health recommendations were provided to patient.   Susa Engman, CMA   09/08/2023   After Visit Summary: (MyChart) Due to this being a telephonic visit, the after visit summary with patients personalized plan was offered to patient via MyChart   Notes: Nothing significant to report at this time.

## 2023-09-08 NOTE — Telephone Encounter (Signed)
 No answer, left message to call if having any issues or concerns, B.Vale Mousseau RN

## 2023-09-09 ENCOUNTER — Encounter: Payer: Self-pay | Admitting: Physician Assistant

## 2023-09-09 LAB — SURGICAL PATHOLOGY

## 2023-09-10 ENCOUNTER — Other Ambulatory Visit: Payer: Self-pay | Admitting: *Deleted

## 2023-09-10 DIAGNOSIS — F330 Major depressive disorder, recurrent, mild: Secondary | ICD-10-CM

## 2023-09-10 DIAGNOSIS — E785 Hyperlipidemia, unspecified: Secondary | ICD-10-CM

## 2023-09-10 MED ORDER — ESCITALOPRAM OXALATE 20 MG PO TABS
ORAL_TABLET | ORAL | 1 refills | Status: DC
Start: 2023-09-10 — End: 2023-10-30

## 2023-09-10 MED ORDER — LOSARTAN POTASSIUM 100 MG PO TABS: ORAL_TABLET | ORAL | 1 refills | Status: DC

## 2023-09-10 NOTE — Telephone Encounter (Signed)
 Pt requests 90 day supply of all rxs. Looks like lexapro  and cozaar  are the only 2 I can do at this time however there was a drug interaction warning.  Please see below and advise. Also, is it ok to send 90 day supply of trazodone  or do you prefer to keep that at 30 day supply?  Drug-Drug: escitalopram  and traZODoneAdditive serotonergic effects may occur during coadministration of selective serotonin reuptake inhibitors (SSRIs) and Serotonergic Non-Opioid CNS Depressants, and the risk of developing serotonin syndrome may be increased.

## 2023-09-14 ENCOUNTER — Ambulatory Visit: Payer: Self-pay | Admitting: Internal Medicine

## 2023-09-22 ENCOUNTER — Ambulatory Visit: Payer: Medicare HMO | Admitting: Internal Medicine

## 2023-10-13 ENCOUNTER — Other Ambulatory Visit: Payer: Self-pay | Admitting: Physician Assistant

## 2023-10-13 ENCOUNTER — Telehealth: Payer: Self-pay | Admitting: Physician Assistant

## 2023-10-13 DIAGNOSIS — G4700 Insomnia, unspecified: Secondary | ICD-10-CM

## 2023-10-13 NOTE — Telephone Encounter (Unsigned)
 Copied from CRM [id number]. Topic: Clinical - Medication Refill >> Oct 13, 2023  3:16 PM Chasity T wrote: Medication: lisdexamfetamine (VYVANSE ) 20 MG capsule   (Patient needs it to be for a 90 day supply and not 30 days)   Has the patient contacted their pharmacy? Yes  This is the patient's preferred pharmacy:  [id number] - [place],  - [address] [facility] [place] [address] Phone: [phone] Fax: [phone]    Is this the correct pharmacy for this prescription? Yes If no, delete pharmacy and type the correct one.   Has the prescription been filled recently? Yes  Is the patient out of the medication? Yes  Has the patient been seen for an appointment in the last year OR does the patient have an upcoming appointment? Yes  Can we respond through MyChart? Yes  Agent: Please be advised that Rx refills may take up to 3 business days. We ask that you follow-up with your pharmacy.

## 2023-10-29 ENCOUNTER — Ambulatory Visit: Admitting: Physician Assistant

## 2023-10-29 VITALS — BP 124/80 | HR 73 | Resp 16 | Ht 64.0 in | Wt 132.2 lb

## 2023-10-29 DIAGNOSIS — F900 Attention-deficit hyperactivity disorder, predominantly inattentive type: Secondary | ICD-10-CM | POA: Diagnosis not present

## 2023-10-29 DIAGNOSIS — E1165 Type 2 diabetes mellitus with hyperglycemia: Secondary | ICD-10-CM | POA: Diagnosis not present

## 2023-10-29 DIAGNOSIS — F330 Major depressive disorder, recurrent, mild: Secondary | ICD-10-CM

## 2023-10-29 DIAGNOSIS — I10 Essential (primary) hypertension: Secondary | ICD-10-CM

## 2023-10-29 DIAGNOSIS — M79672 Pain in left foot: Secondary | ICD-10-CM | POA: Diagnosis not present

## 2023-10-29 DIAGNOSIS — Z7985 Long-term (current) use of injectable non-insulin antidiabetic drugs: Secondary | ICD-10-CM

## 2023-10-29 LAB — BASIC METABOLIC PANEL WITH GFR
BUN: 15 mg/dL (ref 6–23)
CO2: 26 meq/L (ref 19–32)
Calcium: 9.2 mg/dL (ref 8.4–10.5)
Chloride: 105 meq/L (ref 96–112)
Creatinine, Ser: 0.76 mg/dL (ref 0.40–1.20)
GFR: 81.02 mL/min (ref >60.00)
Glucose, Bld: 143 mg/dL — ABNORMAL HIGH (ref 70–99)
Potassium: 3.7 meq/L (ref 3.5–5.1)
Sodium: 141 meq/L (ref 135–145)

## 2023-10-29 LAB — MICROALBUMIN / CREATININE URINE RATIO
Creatinine,U: 127.1 mg/dL
Microalb Creat Ratio: 8.7 mg/g (ref 0.0–30.0)
Microalb, Ur: 1.1 mg/dL (ref 0.0–1.9)

## 2023-10-29 LAB — URIC ACID: Uric Acid, Serum: 3 mg/dL (ref 2.4–7.0)

## 2023-10-29 LAB — HEMOGLOBIN A1C: Hgb A1c MFr Bld: 8.1 % — ABNORMAL HIGH (ref 4.6–6.5)

## 2023-10-29 MED ORDER — LISDEXAMFETAMINE DIMESYLATE 20 MG PO CAPS: ORAL_CAPSULE | ORAL | 0 refills | Status: DC

## 2023-10-29 NOTE — Progress Notes (Signed)
 Established patient visit   Patient: Sydney Dickson   DOB: Aug 27, 1955   68 y.o. Female  MRN: [id number] Visit Date: 10/29/2023  Today's healthcare provider: Manuelita Flatness, PA-C   Chief Complaint  Patient presents with   Follow-up    Follow up diabetes     Medication Refill    Also wants to get her vyvanse  filled  Wants a rx or monjaro   Subjective     Diabetes Mellitus Type II, Follow-up  Lab Results  Component Value Date   HGBA1C 8.1 (H) 10/29/2023   HGBA1C 8.8 (H) 06/29/2023   HGBA1C 10.3 (H) 03/08/2023   Wt Readings from Last 3 Encounters:  10/29/23 132 lb 3.2 oz (60 kg)  09/08/23 127 lb (57.6 kg)  08/17/23 130 lb (59 kg)   She started Jardiance  that was prescribed last visit, but after a few days felt lightheaded and stopped.  Home blood sugar records: 129 today. She does not check regularly.   Episodes of hypoglycemia? no   Current insulin  regiment: none  Pertinent Labs: Lab Results  Component Value Date   CHOL 132 03/08/2023   HDL 46 (L) 03/08/2023   LDLCALC 59 03/08/2023   TRIG 208 (H) 03/08/2023   CHOLHDL 2.9 03/08/2023   Lab Results  Component Value Date   NA 141 10/29/2023   K 3.7 10/29/2023   CREATININE 0.76 10/29/2023   GFRNONAA 76 06/18/2020   MICRALBCREAT 8.7 10/29/2023     --------------------------------------------------------------------------------------------------- Discussed the use of AI scribe software for clinical note transcription with the patient, who gave verbal consent to proceed.  History of Present Illness    She experiences persistent dizziness, which began after starting Jardiance . She discontinued Jardiance  and then her losartan  due to dizziness.  Dizziness improved after stopping losartan    About a week ago, she experienced severe pain, redness, and swelling in her left foot, which has improved but remains slightly swollen. There is no evidence of a spider bite.  She feels her depression is worsening,  possibly due to not taking Vyvanse  or life stressors, including concerns about her son's health and financial situation. She feels tired constantly and has difficulty sleeping, especially without Vyvanse . She sleeps during the day when missing her medication but struggles to sleep at night.   Medications: Outpatient Medications Prior to Visit  Medication Sig Note   acetaminophen  (TYLENOL ) 500 MG tablet Take 1,000 mg by mouth daily as needed for moderate pain or headache.    ALPRAZolam  (XANAX ) 0.5 MG tablet Take 1/2-1 tab as needed for sleep limit to 5 days a week to prevent addicition    aspirin 81 MG tablet Take 81 mg by mouth daily.    CHOLECALCIFEROL PO Take 5,000 Units by mouth daily.    Cyanocobalamin (B-12 PO) Take by mouth.    escitalopram  (LEXAPRO ) 20 MG tablet Take 30 mg by mouth daily.    glucose blood (ONETOUCH VERIO) test strip Use as instructedCHECK BLOOD SUGAR 3 TIMES DAILY-DX-E11.65    Lancets (ONETOUCH ULTRASOFT) lancets Use 3 lancets daily to check blood sugar-DX-E11.65    levocetirizine (XYZAL ) 5 MG tablet TAKE 1 TAB BY MOUTH DAILY FOR ALLERGIES    rosuvastatin  (CRESTOR ) 20 MG tablet TAKE 1 TABLET BY MOUTH EVERY DAY FOR CHOLESTEROL    tirzepatide  (MOUNJARO ) 10 MG/0.5ML Pen INJECT 1 PEN ( 10 MG ) INTO SKIN EVERY 7 DAYS ( DX: E11.29)    traZODone  (DESYREL ) 50 MG tablet Take 0.5-1 tablets (25-50 mg total) by mouth  at bedtime as needed for sleep.    [DISCONTINUED] escitalopram  (LEXAPRO ) 20 MG tablet TAKE 1 TABLET BY MOUTH DAILY FOR MOOD    [DISCONTINUED] lisdexamfetamine (VYVANSE ) 20 MG capsule Take 1 tab daily in the morning as needed    [DISCONTINUED] losartan  (COZAAR ) 100 MG tablet Take 1 tablet Daily for BP & Diabetic Kidney Protection 10/30/2023: pt self dc   No facility-administered medications prior to visit.    Review of Systems  Constitutional:  Negative for fatigue and fever.  Respiratory:  Negative for cough and shortness of breath.   Cardiovascular:  Negative for  chest pain and leg swelling.  Gastrointestinal:  Negative for abdominal pain.  Neurological:  Positive for light-headedness. Negative for dizziness and headaches.       Objective    BP 124/80   Pulse 73   Resp 16   Ht 5' 4 (1.626 m)   Wt 132 lb 3.2 oz (60 kg)   LMP  (LMP Unknown)   SpO2 98%   BMI 22.69 kg/m    Physical Exam Constitutional:      General: She is awake.     Appearance: She is well-developed.  HENT:     Head: Normocephalic.  Eyes:     Conjunctiva/sclera: Conjunctivae normal.  Cardiovascular:     Rate and Rhythm: Normal rate and regular rhythm.     Heart sounds: Normal heart sounds.  Pulmonary:     Effort: Pulmonary effort is normal.     Breath sounds: Normal breath sounds.  Feet:     Comments: Left foot with no edema, erythema, lesion Skin:    General: Skin is warm.  Neurological:     Mental Status: She is alert and oriented to person, place, and time.  Psychiatric:        Attention and Perception: Attention normal.        Mood and Affect: Mood normal.        Speech: Speech normal.        Behavior: Behavior is cooperative.      Results for orders placed or performed in visit on 10/29/23  HgB A1c  Result Value Ref Range   Hgb A1c MFr Bld 8.1 (H) 4.6 - 6.5 %  Basic Metabolic Panel (BMET)  Result Value Ref Range   Sodium 141 135 - 145 mEq/L   Potassium 3.7 3.5 - 5.1 mEq/L   Chloride 105 96 - 112 mEq/L   CO2 26 19 - 32 mEq/L   Glucose, Bld 143 (H) 70 - 99 mg/dL   BUN 15 6 - 23 mg/dL   Creatinine, Ser 9.23 0.40 - 1.20 mg/dL   GFR 18.97 >39.99 mL/min   Calcium  9.2 8.4 - 10.5 mg/dL  Urine Microalbumin w/creat. ratio  Result Value Ref Range   Microalb, Ur 1.1 0.0 - 1.9 mg/dL   Creatinine,U 872.8 mg/dL   Microalb Creat Ratio 8.7 0.0 - 30.0 mg/g  Uric acid  Result Value Ref Range   Uric Acid, Serum 3.0 2.4 - 7.0 mg/dL    Assessment & Plan    Type 2 diabetes mellitus with hyperglycemia, without long-term current use of insulin   (HCC) Assessment & Plan: Repeat A1c today Pt only on mounjaro  10 mg weekly.  Trial and failure of: metformin , farxiga , ozempic , trulicity , jardiance  Repeat uacr given off arb.  On statin, ldl at goal.  Pending A1c titrate mounjaro  F/b 4 mo  Orders: -     Hemoglobin A1c -     Basic metabolic panel with  GFR -     Microalbumin / creatinine urine ratio  Attention deficit hyperactivity disorder (ADHD), predominantly inattentive type -     Lisdexamfetamine Dimesylate ; Take 1 tab daily in the morning as needed  Dispense: 30 capsule; Refill: 0  Left foot pain -     Uric acid  Essential hypertension Assessment & Plan: BP normal today, off of losartan  100 mg completed.  Advised pt to monitor at home.     Mild episode of recurrent major depressive disorder [facility]) Assessment & Plan: Worsening symptoms. Advise can increase lexapro  to 30 mg daily, 1 and 1/2 tablets     Return in about 4 months (around 02/29/2024) for DMII, anxiety, depression, hypertension.       Manuelita Flatness, PA-C  [facility] [phone] (phone) [phone] (fax)  [facility]

## 2023-10-30 ENCOUNTER — Ambulatory Visit: Payer: Self-pay | Admitting: Physician Assistant

## 2023-10-30 ENCOUNTER — Encounter: Payer: Self-pay | Admitting: Physician Assistant

## 2023-10-30 DIAGNOSIS — E1165 Type 2 diabetes mellitus with hyperglycemia: Secondary | ICD-10-CM

## 2023-10-30 MED ORDER — TIRZEPATIDE 12.5 MG/0.5ML ~~LOC~~ SOAJ: 12.5000 mg | SUBCUTANEOUS | 3 refills | Status: DC

## 2023-10-30 NOTE — Assessment & Plan Note (Signed)
 Repeat A1c today Pt only on mounjaro  10 mg weekly.  Trial and failure of: metformin , farxiga , ozempic , trulicity , jardiance  Repeat uacr given off arb.  On statin, ldl at goal.  Pending A1c titrate mounjaro  F/b 4 mo

## 2023-10-30 NOTE — Assessment & Plan Note (Signed)
 Worsening symptoms. Advise can increase lexapro  to 30 mg daily, 1 and 1/2 tablets

## 2023-10-30 NOTE — Assessment & Plan Note (Signed)
 BP normal today, off of losartan  100 mg completed.  Advised pt to monitor at home.

## 2023-11-03 ENCOUNTER — Other Ambulatory Visit: Payer: Self-pay | Admitting: Physician Assistant

## 2023-11-03 DIAGNOSIS — F900 Attention-deficit hyperactivity disorder, predominantly inattentive type: Secondary | ICD-10-CM

## 2023-11-03 MED ORDER — LISDEXAMFETAMINE DIMESYLATE 20 MG PO CAPS: ORAL_CAPSULE | ORAL | 0 refills | Status: DC

## 2023-11-12 ENCOUNTER — Encounter: Payer: Medicare HMO | Admitting: Nurse Practitioner

## 2023-11-30 ENCOUNTER — Other Ambulatory Visit: Payer: Self-pay | Admitting: Physician Assistant

## 2023-11-30 DIAGNOSIS — F900 Attention-deficit hyperactivity disorder, predominantly inattentive type: Secondary | ICD-10-CM

## 2023-11-30 NOTE — Telephone Encounter (Unsigned)
 Copied from CRM [id number]. Topic: Clinical - Medication Refill >> Nov 30, 2023 11:44 AM Robinson H wrote: Medication: lisdexamfetamine (VYVANSE ) 20 MG capsule  Has the patient contacted their pharmacy? No, no refills (Agent: If no, request that the patient contact the pharmacy for the refill. If patient does not wish to contact the pharmacy document the reason why and proceed with request.) (Agent: If yes, when and what did the pharmacy advise?)  This is the patient's preferred pharmacy:  [id number] [place], [place] - [address] [place] [address] Phone: [phone] Fax: [phone]   Is this the correct pharmacy for this prescription? Yes If no, delete pharmacy and type the correct one.   Has the prescription been filled recently? No  Is the patient out of the medication? Yes  Has the patient been seen for an appointment in the last year OR does the patient have an upcoming appointment? Yes  Can we respond through MyChart? Yes  Agent: Please be advised that Rx refills may take up to 3 business days. We ask that you follow-up with your pharmacy.

## 2023-12-01 ENCOUNTER — Encounter: Payer: Medicare HMO | Admitting: Nurse Practitioner

## 2023-12-01 NOTE — Telephone Encounter (Signed)
 Refills sent on 11/28/23.

## 2024-01-03 ENCOUNTER — Encounter: Payer: Medicare HMO | Admitting: Nurse Practitioner

## 2024-02-02 ENCOUNTER — Encounter: Payer: Self-pay | Admitting: Family

## 2024-02-02 ENCOUNTER — Ambulatory Visit: Admitting: Family

## 2024-02-02 VITALS — BP 135/72 | HR 75 | Temp 98.4°F | Resp 16 | Ht 64.0 in | Wt 130.6 lb

## 2024-02-02 DIAGNOSIS — Z15068 Genetic susceptibility to other malignant neoplasm of digestive system: Secondary | ICD-10-CM | POA: Diagnosis not present

## 2024-02-02 DIAGNOSIS — Z794 Long term (current) use of insulin: Secondary | ICD-10-CM

## 2024-02-02 DIAGNOSIS — E1165 Type 2 diabetes mellitus with hyperglycemia: Secondary | ICD-10-CM

## 2024-02-02 DIAGNOSIS — I10 Essential (primary) hypertension: Secondary | ICD-10-CM

## 2024-02-02 DIAGNOSIS — F330 Major depressive disorder, recurrent, mild: Secondary | ICD-10-CM

## 2024-02-02 DIAGNOSIS — Z1507 Genetic susceptibility to malignant neoplasm of urinary tract: Secondary | ICD-10-CM

## 2024-02-02 DIAGNOSIS — Z15060 Genetic susceptibility to colorectal cancer: Secondary | ICD-10-CM | POA: Diagnosis not present

## 2024-02-02 DIAGNOSIS — F988 Other specified behavioral and emotional disorders with onset usually occurring in childhood and adolescence: Secondary | ICD-10-CM

## 2024-02-02 DIAGNOSIS — Z79899 Other long term (current) drug therapy: Secondary | ICD-10-CM | POA: Diagnosis not present

## 2024-02-02 DIAGNOSIS — Z1509 Genetic susceptibility to other malignant neoplasm: Secondary | ICD-10-CM | POA: Diagnosis not present

## 2024-02-02 LAB — URINALYSIS, ROUTINE W REFLEX MICROSCOPIC
Bilirubin Urine: NEGATIVE
Hgb urine dipstick: NEGATIVE
Ketones, ur: NEGATIVE
Nitrite: NEGATIVE
Specific Gravity, Urine: 1.02 (ref 1.000–1.030)
Total Protein, Urine: NEGATIVE
Urine Glucose: NEGATIVE
Urobilinogen, UA: 0.2 (ref 0.0–1.0)
pH: 5.5 (ref 5.0–8.0)

## 2024-02-02 LAB — BASIC METABOLIC PANEL WITH GFR
BUN: 17 mg/dL (ref 6–23)
CO2: 31 meq/L (ref 19–32)
Calcium: 9.4 mg/dL (ref 8.4–10.5)
Chloride: 103 meq/L (ref 96–112)
Creatinine, Ser: 0.74 mg/dL (ref 0.40–1.20)
GFR: 83.5 mL/min (ref >60.00)
Glucose, Bld: 192 mg/dL — ABNORMAL HIGH (ref 70–99)
Potassium: 4.2 meq/L (ref 3.5–5.1)
Sodium: 140 meq/L (ref 135–145)

## 2024-02-02 LAB — HEMOGLOBIN A1C: Hgb A1c MFr Bld: 7.6 % — ABNORMAL HIGH (ref 4.6–6.5)

## 2024-02-02 NOTE — Patient Instructions (Signed)
 VISIT SUMMARY:  Today, we reviewed your diabetes management, Lynch syndrome, depression and anxiety, and adult ADD. Your diabetes is improving with a lower A1c, and we discussed further steps for your Lynch syndrome and mental health management.  YOUR PLAN:  TYPE 2 DIABETES MELLITUS WITH HYPERGLYCEMIA: Your diabetes management is showing improvement with your A1c levels decreasing. You are currently on Mounjaro  12.5 mg and have lost significant weight. -We will order an A1c test to monitor your levels. -If your A1c is above 7%, we may consider adding another medication.  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 We discussed the option of a hysterectomy or continued surveillance. -We will refer you to gynecology to discuss the possibility of a hysterectomy. -Ensure you continue with your annual colonoscopies. -We will order an annual urinalysis to monitor your health. -Continue annual mammograms.  DEPRESSION AND ANXIETY: Your depression and anxiety are currently managed with Lexapro , but your living situation is affecting your mental health. Counseling has been helpful in the past. -We provided you with a pamphlet on counseling services. -If there is no improvement, we may refer you to a psychiatrist.  ATTENTION DEFICIT DISORDER, ADULT: Your adult ADD is managed with Vyvanse , which helps with focus and daily functioning. -Continue taking Vyvanse  as prescribed.
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
## 2024-02-02 NOTE — Assessment & Plan Note (Addendum)
 Lab Results  Component Value Date   HGBA1C 8.1 (H) 10/29/2023   HGBA1C 8.8 (H) 06/29/2023   HGBA1C 10.3 (H) 03/08/2023   Lab Results  Component Value Date   MICROALBUR 1.1 10/29/2023   LDLCALC 59 03/08/2023   CREATININE 0.76 10/29/2023   Continues mounjaro . Update A1C, if still above goal will need to further adjust meds.

## 2024-02-02 NOTE — Assessment & Plan Note (Addendum)
 Will refer to GYN to discuss possible TAH/BSO for  cancer risk reduction. Mammo/colo up to date.

## 2024-02-02 NOTE — Assessment & Plan Note (Signed)
 BP has been stable without medication.

## 2024-02-02 NOTE — Assessment & Plan Note (Signed)
 Still having some depression symptoms. Not much improvement with increase in lexapro  dose.  She has tried wellbutrin in the past which did not agree with her. She is interested in starting some counseling and I gave her information to call to schedule. If symptoms do not improve with counseling we discussed referral to Psychiatry.

## 2024-02-02 NOTE — Assessment & Plan Note (Signed)
 Stable on Vyvanse , updated controlled substance contract and UDS today.

## 2024-02-03 ENCOUNTER — Telehealth: Payer: Self-pay | Admitting: Family

## 2024-02-03 DIAGNOSIS — Z794 Long term (current) use of insulin: Secondary | ICD-10-CM

## 2024-02-03 DIAGNOSIS — F900 Attention-deficit hyperactivity disorder, predominantly inattentive type: Secondary | ICD-10-CM

## 2024-02-03 DIAGNOSIS — G4700 Insomnia, unspecified: Secondary | ICD-10-CM

## 2024-02-03 DIAGNOSIS — E785 Hyperlipidemia, unspecified: Secondary | ICD-10-CM

## 2024-02-03 DIAGNOSIS — E1165 Type 2 diabetes mellitus with hyperglycemia: Secondary | ICD-10-CM

## 2024-02-03 MED ORDER — PIOGLITAZONE HCL 30 MG PO TABS: 30.0000 mg | ORAL_TABLET | Freq: Every day | ORAL | 0 refills | Status: DC

## 2024-02-03 NOTE — Telephone Encounter (Signed)
 See mychart.

## 2024-02-03 NOTE — Telephone Encounter (Signed)
 Opened in error

## 2024-02-05 LAB — DRUG MONITORING, PANEL 8 WITH CONFIRMATION, URINE
6 Acetylmorphine: NEGATIVE ng/mL (ref <10)
Alcohol Metabolites: NEGATIVE ng/mL (ref <500)
Amphetamine: 4246 ng/mL — ABNORMAL HIGH (ref <250)
Amphetamines: POSITIVE ng/mL — AB (ref <500)
Benzodiazepines: NEGATIVE ng/mL (ref <100)
Buprenorphine, Urine: NEGATIVE ng/mL (ref <5)
Cocaine Metabolite: NEGATIVE ng/mL (ref <150)
Creatinine: 88.8 mg/dL (ref >=20.0)
MDMA: NEGATIVE ng/mL (ref <500)
Marijuana Metabolite: NEGATIVE ng/mL (ref <20)
Methamphetamine: NEGATIVE ng/mL (ref <250)
Opiates: NEGATIVE ng/mL (ref <100)
Oxidant: NEGATIVE ug/mL (ref <200)
Oxycodone: NEGATIVE ng/mL (ref <100)
pH: 5.4 (ref 4.5–9.0)

## 2024-02-05 LAB — DM TEMPLATE

## 2024-02-22 MED ORDER — ROSUVASTATIN CALCIUM 20 MG PO TABS
20.0000 mg | ORAL_TABLET | Freq: Every day | ORAL | 1 refills | Status: AC
Start: 2024-02-22 — End: ?

## 2024-02-22 MED ORDER — LISDEXAMFETAMINE DIMESYLATE 20 MG PO CAPS: ORAL_CAPSULE | ORAL | 0 refills | Status: DC

## 2024-02-22 MED ORDER — TRAZODONE HCL 50 MG PO TABS: 25.0000 mg | ORAL_TABLET | Freq: Every evening | ORAL | 1 refills | Status: AC | PRN

## 2024-02-22 MED ORDER — LEVOCETIRIZINE DIHYDROCHLORIDE 5 MG PO TABS: 5.0000 mg | ORAL_TABLET | Freq: Every evening | ORAL | 1 refills | Status: AC

## 2024-02-22 MED ORDER — PIOGLITAZONE HCL 30 MG PO TABS: 30.0000 mg | ORAL_TABLET | Freq: Every day | ORAL | 1 refills | Status: AC

## 2024-02-22 MED ORDER — TIRZEPATIDE 12.5 MG/0.5ML ~~LOC~~ SOAJ: 12.5000 mg | SUBCUTANEOUS | 3 refills | Status: AC

## 2024-02-22 NOTE — Addendum Note (Signed)
 Addended by: DARYL SETTER on: 02/22/2024 08:57 AM   Modules accepted: Orders

## 2024-02-22 NOTE — Telephone Encounter (Signed)
 Requesting: Vyvanse  20mg  Contract:02/02/24 UDS:02/02/24 Last Visit: 02/02/24 Next Visit:05/05/24 Last Refill: 11/28/23 #90 and 0RF   Please Advise

## 2024-02-22 NOTE — Addendum Note (Signed)
 Addended by: Therasa Lorenzi D on: 02/22/2024 08:46 AM   Modules accepted: Orders

## 2024-03-23 ENCOUNTER — Other Ambulatory Visit: Payer: Self-pay | Admitting: Family

## 2024-03-23 DIAGNOSIS — F900 Attention-deficit hyperactivity disorder, predominantly inattentive type: Secondary | ICD-10-CM

## 2024-03-23 MED ORDER — LISDEXAMFETAMINE DIMESYLATE 20 MG PO CAPS: ORAL_CAPSULE | ORAL | 0 refills | Status: AC

## 2024-04-03 ENCOUNTER — Ambulatory Visit: Payer: Medicare HMO | Admitting: Internal Medicine

## 2024-04-12 ENCOUNTER — Other Ambulatory Visit: Payer: Self-pay | Admitting: Family

## 2024-04-12 DIAGNOSIS — F330 Major depressive disorder, recurrent, mild: Secondary | ICD-10-CM

## 2024-04-12 DIAGNOSIS — F419 Anxiety disorder, unspecified: Secondary | ICD-10-CM

## 2024-05-05 ENCOUNTER — Ambulatory Visit: Admitting: Family

## 2024-06-09 ENCOUNTER — Ambulatory Visit: Admitting: Family

## 2024-06-12 ENCOUNTER — Encounter: Admitting: Obstetrics & Gynecology

## 2024-09-08 ENCOUNTER — Ambulatory Visit
